# Patient Record
Sex: Female | Born: 1955
Health system: Southern US, Community
[De-identification: ages and names within clinical notes are randomized; demographics above are authoritative.]

## PROBLEM LIST (undated history)

## (undated) DIAGNOSIS — I10 Essential (primary) hypertension: Secondary | ICD-10-CM

## (undated) DIAGNOSIS — F32A Depression, unspecified: Secondary | ICD-10-CM

## (undated) DIAGNOSIS — G43009 Migraine without aura, not intractable, without status migrainosus: Secondary | ICD-10-CM

## (undated) DIAGNOSIS — T7840XA Allergy, unspecified, initial encounter: Secondary | ICD-10-CM

## (undated) DIAGNOSIS — F329 Major depressive disorder, single episode, unspecified: Secondary | ICD-10-CM

## (undated) DIAGNOSIS — M8580 Other specified disorders of bone density and structure, unspecified site: Secondary | ICD-10-CM

## (undated) DIAGNOSIS — E079 Disorder of thyroid, unspecified: Secondary | ICD-10-CM

## (undated) DIAGNOSIS — G4700 Insomnia, unspecified: Secondary | ICD-10-CM

## (undated) DIAGNOSIS — E785 Hyperlipidemia, unspecified: Secondary | ICD-10-CM

## (undated) DIAGNOSIS — K219 Gastro-esophageal reflux disease without esophagitis: Secondary | ICD-10-CM

## (undated) DIAGNOSIS — N951 Menopausal and female climacteric states: Secondary | ICD-10-CM

## (undated) HISTORY — DX: Allergy, unspecified, initial encounter: T78.40XA

## (undated) HISTORY — DX: Essential (primary) hypertension: I10

## (undated) HISTORY — DX: Major depressive disorder, single episode, unspecified: F32.9

## (undated) HISTORY — DX: Gastro-esophageal reflux disease without esophagitis: K21.9

## (undated) HISTORY — DX: Hyperlipidemia, unspecified: E78.5

## (undated) HISTORY — DX: Insomnia, unspecified: G47.00

## (undated) HISTORY — PX: TUBAL LIGATION: SHX77

## (undated) HISTORY — DX: Other specified disorders of bone density and structure, unspecified site: M85.80

## (undated) HISTORY — DX: Menopausal and female climacteric states: N95.1

## (undated) HISTORY — DX: Depression, unspecified: F32.A

## (undated) HISTORY — DX: Disorder of thyroid, unspecified: E07.9

## (undated) HISTORY — DX: Migraine without aura, not intractable, without status migrainosus: G43.009

---

## 2006-09-05 ENCOUNTER — Ambulatory Visit: Payer: Self-pay | Admitting: Gastroenterology

## 2010-05-15 ENCOUNTER — Ambulatory Visit: Payer: Self-pay | Admitting: Internal Medicine

## 2010-08-26 ENCOUNTER — Emergency Department: Payer: Self-pay | Admitting: Unknown Physician Specialty

## 2012-12-25 ENCOUNTER — Ambulatory Visit: Payer: Self-pay

## 2014-02-17 ENCOUNTER — Ambulatory Visit: Payer: Self-pay

## 2014-07-02 ENCOUNTER — Ambulatory Visit (INDEPENDENT_AMBULATORY_CARE_PROVIDER_SITE_OTHER): Payer: No Typology Code available for payment source | Admitting: Unknown Physician Specialty

## 2014-07-02 ENCOUNTER — Encounter: Payer: Self-pay | Admitting: Unknown Physician Specialty

## 2014-07-02 VITALS — BP 106/70 | HR 76 | Temp 98.3°F | Ht 65.0 in | Wt 188.4 lb

## 2014-07-02 DIAGNOSIS — R0602 Shortness of breath: Secondary | ICD-10-CM

## 2014-07-02 DIAGNOSIS — R079 Chest pain, unspecified: Secondary | ICD-10-CM

## 2014-07-02 DIAGNOSIS — G4730 Sleep apnea, unspecified: Secondary | ICD-10-CM | POA: Diagnosis not present

## 2014-07-02 NOTE — Patient Instructions (Signed)
Sleep Apnea  Sleep apnea is a sleep disorder characterized by abnormal pauses in breathing while you sleep. When your breathing pauses, the level of oxygen in your blood decreases. This causes you to move out of deep sleep and into light sleep. As a result, your quality of sleep is poor, and the system that carries your blood throughout your body (cardiovascular system) experiences stress. If sleep apnea remains untreated, the following conditions can develop:  High blood pressure (hypertension).  Coronary artery disease.  Inability to achieve or maintain an erection (impotence).  Impairment of your thought process (cognitive dysfunction). There are three types of sleep apnea: 1. Obstructive sleep apnea--Pauses in breathing during sleep because of a blocked airway. 2. Central sleep apnea--Pauses in breathing during sleep because the area of the brain that controls your breathing does not send the correct signals to the muscles that control breathing. 3. Mixed sleep apnea--A combination of both obstructive and central sleep apnea. RISK FACTORS The following risk factors can increase your risk of developing sleep apnea:  Being overweight.  Smoking.  Having narrow passages in your nose and throat.  Being of older age.  Being female.  Alcohol use.  Sedative and tranquilizer use.  Ethnicity. Among individuals younger than 35 years, African Americans are at increased risk of sleep apnea. SYMPTOMS   Difficulty staying asleep.  Daytime sleepiness and fatigue.  Loss of energy.  Irritability.  Loud, heavy snoring.  Morning headaches.  Trouble concentrating.  Forgetfulness.  Decreased interest in sex. DIAGNOSIS  In order to diagnose sleep apnea, your caregiver will perform a physical examination. Your caregiver may suggest that you take a home sleep test. Your caregiver may also recommend that you spend the night in a sleep lab. In the sleep lab, several monitors record  information about your heart, lungs, and brain while you sleep. Your leg and arm movements and blood oxygen level are also recorded. TREATMENT The following actions may help to resolve mild sleep apnea:  Sleeping on your side.   Using a decongestant if you have nasal congestion.   Avoiding the use of depressants, including alcohol, sedatives, and narcotics.   Losing weight and modifying your diet if you are overweight. There also are devices and treatments to help open your airway:  Oral appliances. These are custom-made mouthpieces that shift your lower jaw forward and slightly open your bite. This opens your airway.  Devices that create positive airway pressure. This positive pressure "splints" your airway open to help you breathe better during sleep. The following devices create positive airway pressure:  Continuous positive airway pressure (CPAP) device. The CPAP device creates a continuous level of air pressure with an air pump. The air is delivered to your airway through a mask while you sleep. This continuous pressure keeps your airway open.  Nasal expiratory positive airway pressure (EPAP) device. The EPAP device creates positive air pressure as you exhale. The device consists of single-use valves, which are inserted into each nostril and held in place by adhesive. The valves create very little resistance when you inhale but create much more resistance when you exhale. That increased resistance creates the positive airway pressure. This positive pressure while you exhale keeps your airway open, making it easier to breath when you inhale again.  Bilevel positive airway pressure (BPAP) device. The BPAP device is used mainly in patients with central sleep apnea. This device is similar to the CPAP device because it also uses an air pump to deliver continuous air pressure   through a mask. However, with the BPAP machine, the pressure is set at two different levels. The pressure when you  exhale is lower than the pressure when you inhale.  Surgery. Typically, surgery is only done if you cannot comply with less invasive treatments or if the less invasive treatments do not improve your condition. Surgery involves removing excess tissue in your airway to create a wider passage way. Document Released: 12/31/2001 Document Revised: 05/07/2012 Document Reviewed: 05/19/2011 ExitCare Patient Information 2015 [employer]. This information is not intended to replace advice given to you by your health care provider. Make sure you discuss any questions you have with your health care provider.  

## 2014-07-02 NOTE — Progress Notes (Signed)
BP 106/70 mmHg  Pulse 76  Temp(Src) 98.3 F (36.8 C) (Oral)  Ht  (1.651 m)  Wt 188 lb 6.4 oz (85.458 kg)  BMI 31.35 kg/m2  SpO2 95%   Subjective:    Patient ID: Kayla Wright, female    DOB: 17-Jun-1955, 59 y.o.   MRN: [id number]  HPI: SYLVANIA Wright is a 59 y.o. female presenting on 07/02/2014 for Shortness of Breath  Sleep apnea: Wore a monitor from Home health services and noted oxygen dropping to 75% at night.  She needs a sleep study due to the above, snoring, and sleepy all day.    Shortness of Breath This is a recurrent problem. The problem occurs intermittently. The problem has been unchanged. The average episode lasts 8 hours. Associated symptoms include chest pain and leg swelling. Pertinent negatives include no abdominal pain, claudication, coryza, ear pain, fever, headaches, hemoptysis, leg pain, neck pain or syncope. Nothing aggravates the symptoms. The patient has no known risk factors for DVT/PE. She has tried nothing for the symptoms.      Relevant past medical, surgical, family and social history reviewed and updated as indicated. Interim medical history since our last visit reviewed. Allergies and medications reviewed and updated.  Review of Systems  Constitutional: Negative for fever.  HENT: Negative for ear pain.   Respiratory: Positive for shortness of breath. Negative for hemoptysis.   Cardiovascular: Positive for chest pain and leg swelling. Negative for claudication and syncope.  Gastrointestinal: Negative for abdominal pain.  Musculoskeletal: Negative for neck pain.  Neurological: Negative for headaches.    Per HPI unless specifically indicated above     Objective:    BP 106/70 mmHg  Pulse 76  Temp(Src) 98.3 F (36.8 C) (Oral)  Ht  (1.651 m)  Wt 188 lb 6.4 oz (85.458 kg)  BMI 31.35 kg/m2  SpO2 95%  Wt Readings from Last 3 Encounters:  07/02/14 188 lb 6.4 oz (85.458 kg)  05/07/14 179 lb (81.194 kg)    Physical Exam  Constitutional:  She is oriented to person, place, and time. She appears well-developed and well-nourished. No distress.  HENT:  Head: Normocephalic and atraumatic.  Eyes: Conjunctivae and lids are normal. Right eye exhibits no discharge. Left eye exhibits no discharge. No scleral icterus.  Neck: Normal range of motion.  Cardiovascular: Normal rate and regular rhythm.   Pulmonary/Chest: Effort normal. No respiratory distress.  Abdominal: Soft. Normal appearance and bowel sounds are normal. She exhibits no distension. There is no splenomegaly or hepatomegaly. There is no tenderness.  Musculoskeletal: Normal range of motion.  Neurological: She is alert and oriented to person, place, and time.  Skin: Skin is intact. No rash noted. No pallor.  Psychiatric: She has a normal mood and affect. Her behavior is normal. Judgment and thought content normal.   EKG without acute changes   Assessment & Plan:     Problem List Items Addressed This Visit    None    Visit Diagnoses    Shortness of breath    -  Primary    Low oxygenation at night. Will order a sleep study    Relevant Orders    EKG 12-Lead (Completed)    Nocturnal polysomnography (NPSG)    Chest pain, unspecified chest pain type        Probably related to fatigue.  No acute changes on EKG    Relevant Orders    EKG 12-Lead (Completed)    Sleep apnea  Deoxygenation at night.  Ordered the sleep study    Relevant Orders    Nocturnal polysomnography (NPSG)        Follow up plan: Await results from sleep study

## 2014-07-29 ENCOUNTER — Other Ambulatory Visit: Payer: Self-pay | Admitting: Unknown Physician Specialty

## 2014-07-30 NOTE — Telephone Encounter (Signed)
Please call in.  Thanks.   

## 2014-07-30 NOTE — Telephone Encounter (Signed)
Called rx into pharmacy.

## 2014-08-26 ENCOUNTER — Other Ambulatory Visit: Payer: Self-pay | Admitting: Family Medicine

## 2014-09-17 ENCOUNTER — Other Ambulatory Visit: Payer: Self-pay | Admitting: Unknown Physician Specialty

## 2014-09-20 ENCOUNTER — Other Ambulatory Visit: Payer: Self-pay | Admitting: Unknown Physician Specialty

## 2014-10-20 ENCOUNTER — Other Ambulatory Visit: Payer: Self-pay | Admitting: Unknown Physician Specialty

## 2014-10-21 ENCOUNTER — Encounter: Payer: Self-pay | Admitting: Unknown Physician Specialty

## 2014-10-21 ENCOUNTER — Ambulatory Visit (INDEPENDENT_AMBULATORY_CARE_PROVIDER_SITE_OTHER): Payer: No Typology Code available for payment source | Admitting: Unknown Physician Specialty

## 2014-10-21 VITALS — BP 121/79 | HR 72 | Temp 98.3°F | Ht 65.5 in | Wt 195.8 lb

## 2014-10-21 DIAGNOSIS — E785 Hyperlipidemia, unspecified: Secondary | ICD-10-CM | POA: Diagnosis not present

## 2014-10-21 DIAGNOSIS — E039 Hypothyroidism, unspecified: Secondary | ICD-10-CM | POA: Insufficient documentation

## 2014-10-21 DIAGNOSIS — F419 Anxiety disorder, unspecified: Secondary | ICD-10-CM

## 2014-10-21 DIAGNOSIS — M722 Plantar fascial fibromatosis: Secondary | ICD-10-CM | POA: Diagnosis not present

## 2014-10-21 DIAGNOSIS — F329 Major depressive disorder, single episode, unspecified: Secondary | ICD-10-CM | POA: Insufficient documentation

## 2014-10-21 DIAGNOSIS — F322 Major depressive disorder, single episode, severe without psychotic features: Secondary | ICD-10-CM | POA: Insufficient documentation

## 2014-10-21 MED ORDER — MELOXICAM 15 MG PO TABS: 15.0000 mg | ORAL_TABLET | Freq: Every day | ORAL | Status: DC

## 2014-10-21 MED ORDER — CLONAZEPAM 0.5 MG PO TABS: 0.5000 mg | ORAL_TABLET | Freq: Two times a day (BID) | ORAL | Status: DC | PRN

## 2014-10-21 NOTE — Assessment & Plan Note (Signed)
Decreased Clonazepam to .5 mg

## 2014-10-21 NOTE — Progress Notes (Signed)
   BP 121/79 mmHg  Pulse 72  Temp(Src) 98.3 F (36.8 C)  Ht 5' 5.5" (1.664 m)  Wt 195 lb 12.8 oz (88.814 kg)  BMI 32.08 kg/m2  SpO2 97%   Subjective:    Patient ID: Kayla Wright, female    DOB: 03-Sep-1955, 59 y.o.   MRN: [id number]  HPI: Kayla Wright is a 59 y.o. female  Chief Complaint  Patient presents with  . Foot Pain    pt states she has heal and ankle pain in left foot, states walk-in clinic said she has bone spurs. States she has had pain for about 3 weeks.   Plantar Fascitis: Began about three weeks ago. Dorsiflexion and plantarflexion limited. Can not perform supination or pronation due pain. No injury to her knowledge. Swelling relieved when elevated. Aching pain with occasional sharp pain depending on movement.  Depression: She has been on duloxetine for about 4 months. Feels this is working well. Denies side effects. Would like a refill of Clonazepam.  She is taking 1 mg daily and would like to continue   Relevant past medical, surgical, family and social history reviewed and updated as indicated. Interim medical history since our last visit reviewed. Allergies and medications reviewed and updated.  Review of Systems  Constitutional: Negative.  Negative for activity change, appetite change and fatigue.  HENT: Negative.  Negative for congestion, rhinorrhea, sneezing and sore throat.   Eyes: Negative.  Negative for pain and discharge.  Respiratory: Negative.  Negative for cough, chest tightness, shortness of breath, wheezing and stridor.   Cardiovascular: Negative.  Negative for chest pain, palpitations and leg swelling.  Gastrointestinal: Negative.  Negative for abdominal pain, diarrhea and constipation.  Musculoskeletal: Positive for myalgias. Negative for back pain and gait problem.  Skin: Negative.  Negative for color change, pallor, rash and wound.  Neurological: Negative.  Negative for dizziness, syncope, light-headedness and headaches.  Psychiatric/Behavioral:  Negative.  Negative for suicidal ideas, confusion, sleep disturbance, self-injury and decreased concentration. The patient is not nervous/anxious.     Per HPI unless specifically indicated above     Objective:    BP 121/79 mmHg  Pulse 72  Temp(Src) 98.3 F (36.8 C)  Ht 5' 5.5" (1.664 m)  Wt 195 lb 12.8 oz (88.814 kg)  BMI 32.08 kg/m2  SpO2 97%  Wt Readings from Last 3 Encounters:  10/21/14 195 lb 12.8 oz (88.814 kg)  07/02/14 188 lb 6.4 oz (85.458 kg)  05/07/14 179 lb (81.194 kg)    Physical Exam  No results found for this or any previous visit.    Assessment & Plan:   Problem List Items Addressed This Visit      Unprioritized   Severe depression - Primary    Stable on duloxetine.      Chronic anxiety    Decreased Clonazepam to .5 mg      Hyperlipidemia   Hypothyroidism    Other Visit Diagnoses    Plantar fasciitis        Left foot. Strectching exercises provided with meloxicam prescription. Education handout provided.        Follow up plan: Return in about 3 months (around 01/20/2015) for physical.

## 2014-10-21 NOTE — Patient Instructions (Signed)
Plantar Fasciitis  Plantar fasciitis is a common condition that causes foot pain. It is soreness (inflammation) of the band of tough fibrous tissue on the bottom of the foot that runs from the heel bone (calcaneus) to the ball of the foot. The cause of this soreness may be from excessive standing, poor fitting shoes, running on hard surfaces, being overweight, having an abnormal walk, or overuse (this is common in runners) of the painful foot or feet. It is also common in aerobic exercise dancers and ballet dancers.  SYMPTOMS   Most people with plantar fasciitis complain of:   Severe pain in the morning on the bottom of their foot especially when taking the first steps out of bed. This pain recedes after a few minutes of walking.   Severe pain is experienced also during walking following a long period of inactivity.   Pain is worse when walking barefoot or up stairs  DIAGNOSIS    Your caregiver will diagnose this condition by examining and feeling your foot.   Special tests such as X-rays of your foot, are usually not needed.  PREVENTION    Consult a sports medicine professional before beginning a new exercise program.   Walking programs offer a good workout. With walking there is a lower chance of overuse injuries common to runners. There is less impact and less jarring of the joints.   Begin all new exercise programs slowly. If problems or pain develop, decrease the amount of time or distance until you are at a comfortable level.   Wear good shoes and replace them regularly.   Stretch your foot and the heel cords at the back of the ankle (Achilles tendon) both before and after exercise.   Run or exercise on even surfaces that are not hard. For example, asphalt is better than pavement.   Do not run barefoot on hard surfaces.   If using a treadmill, vary the incline.   Do not continue to workout if you have foot or joint problems. Seek professional help if they do not improve.  HOME CARE INSTRUCTIONS     Avoid activities that cause you pain until you recover.   Use ice or cold packs on the problem or painful areas after working out.   Only take over-the-counter or prescription medicines for pain, discomfort, or fever as directed by your caregiver.   Soft shoe inserts or athletic shoes with air or gel sole cushions may be helpful.   If problems continue or become more severe, consult a sports medicine caregiver or your own health care provider. Cortisone is a potent anti-inflammatory medication that may be injected into the painful area. You can discuss this treatment with your caregiver.  MAKE SURE YOU:    Understand these instructions.   Will watch your condition.   Will get help right away if you are not doing well or get worse.  Document Released: 10/05/2000 Document Revised: 04/04/2011 Document Reviewed: 12/05/2007  ExitCare Patient Information 2015 [employer]. This information is not intended to replace advice given to you by your health care provider. Make sure you discuss any questions you have with your health care provider.

## 2014-10-21 NOTE — Assessment & Plan Note (Signed)
Stable on duloxetine

## 2014-10-27 ENCOUNTER — Other Ambulatory Visit: Payer: Self-pay | Admitting: Unknown Physician Specialty

## 2014-10-27 MED ORDER — DULOXETINE HCL 60 MG PO CPEP: 60.0000 mg | ORAL_CAPSULE | Freq: Every day | ORAL | Status: DC

## 2014-10-27 NOTE — Telephone Encounter (Signed)
Routing to provider. Patient was last seen 10/21/14 and pharmacy is [facility] on [address].

## 2014-10-27 NOTE — Telephone Encounter (Signed)
Cymbalta to [facility] [address]  She is out of refills and switching from [facility] to [place].

## 2014-11-03 ENCOUNTER — Telehealth: Payer: Self-pay | Admitting: Unknown Physician Specialty

## 2014-11-03 NOTE — Telephone Encounter (Signed)
Pt called in and explained her symptoms of numbness in her right arm and hand(she said it just went dead). i advised her to go to the emergency room and she didn't want to do that and wanted to make him an appt

## 2014-11-04 ENCOUNTER — Encounter: Payer: Self-pay | Admitting: Unknown Physician Specialty

## 2014-11-04 ENCOUNTER — Ambulatory Visit (INDEPENDENT_AMBULATORY_CARE_PROVIDER_SITE_OTHER): Payer: No Typology Code available for payment source | Admitting: Unknown Physician Specialty

## 2014-11-04 VITALS — BP 125/69 | HR 66 | Temp 98.3°F | Ht 65.7 in | Wt 201.2 lb

## 2014-11-04 DIAGNOSIS — Z23 Encounter for immunization: Secondary | ICD-10-CM | POA: Diagnosis not present

## 2014-11-04 DIAGNOSIS — M62838 Other muscle spasm: Secondary | ICD-10-CM

## 2014-11-04 DIAGNOSIS — M5410 Radiculopathy, site unspecified: Secondary | ICD-10-CM | POA: Diagnosis not present

## 2014-11-04 MED ORDER — MELOXICAM 15 MG PO TABS: 15.0000 mg | ORAL_TABLET | Freq: Every day | ORAL | Status: DC

## 2014-11-04 MED ORDER — CYCLOBENZAPRINE HCL 10 MG PO TABS: 10.0000 mg | ORAL_TABLET | Freq: Three times a day (TID) | ORAL | Status: DC | PRN

## 2014-11-04 NOTE — Progress Notes (Signed)
-------------------------------------------------------  BP 125/69 mmHg  Pulse 66  Temp(Src) 98.3 F (36.8 C)  Ht 5' 5.7" (1.669 m)  Wt 201 lb 3.2 oz (91.264 kg)  BMI 32.76 kg/m2  SpO2 97%   Subjective:    Patient ID: Kayla Wright, female    DOB: 05-16-1955, 59 y.o.   MRN: [id number]  HPI: Kayla Wright is a 59 y.o. female  Chief Complaint  Patient presents with  . Arm Pain    pt states she has pain and numbness in right arm and hand. States symptoms started about a week ago.   Arm pain "it went dead on me" about 1 week ago while lying on the couch.  States she went to work and experienced pain and numbness.  Went to Urgent Care and was given Tramadol.  She didn't like how it made her feel and only took it for 2 days. Nothing makes it worse or better.   Relevant past medical, surgical, family and social history reviewed and updated as indicated. Interim medical history since our last visit reviewed. Allergies and medications reviewed and updated.  Review of Systems  Per HPI unless specifically indicated above     Objective:    BP 125/69 mmHg  Pulse 66  Temp(Src) 98.3 F (36.8 C)  Ht 5' 5.7" (1.669 m)  Wt 201 lb 3.2 oz (91.264 kg)  BMI 32.76 kg/m2  SpO2 97%  Wt Readings from Last 3 Encounters:  11/04/14 201 lb 3.2 oz (91.264 kg)  10/21/14 195 lb 12.8 oz (88.814 kg)  07/02/14 188 lb 6.4 oz (85.458 kg)    Physical Exam  Constitutional: She is oriented to person, place, and time. She appears well-developed and well-nourished. No distress.  HENT:  Head: Normocephalic and atraumatic.  Eyes: Conjunctivae and lids are normal. Right eye exhibits no discharge. Left eye exhibits no discharge. No scleral icterus.  Cardiovascular: Normal rate, regular rhythm and normal heart sounds.   Pulmonary/Chest: Effort normal and breath sounds normal. No respiratory distress.  Abdominal: Normal appearance and bowel sounds are normal. There is no splenomegaly or hepatomegaly.   Musculoskeletal:       Right shoulder: She exhibits spasm. She exhibits normal strength.       Cervical back: She exhibits decreased range of motion and tenderness. She exhibits no bony tenderness, no edema and no pain.  Strength and DTRs 4 plus and equal bilateral  Neurological: She is alert and oriented to person, place, and time.  Skin: Skin is intact. No rash noted. No pallor.  Psychiatric: She has a normal mood and affect. Her behavior is normal. Judgment and thought content normal.    No results found for this or any previous visit.    Assessment & Plan:   Problem List Items Addressed This Visit    None    Visit Diagnoses    Immunization due    -  Primary    Relevant Orders    Flu Vaccine QUAD 36+ mos PF IM (Fluarix & Fluzone Quad PF) (Completed)    Muscle spasm of right shoulder        Radiculopathy of arm        Relevant Medications    cyclobenzaprine (FLEXERIL) 10 MG tablet       Radiculopathy is related to spasm.  Recommended and numbers given for chiropractic care.  Rx for NSAID and muscle relaxant.    Follow up plan: Return if symptoms worsen or fail to improve.

## 2014-11-04 NOTE — Patient Instructions (Signed)
[facility] [phone] Dr. Patrici Ranks [phone]

## 2014-12-12 ENCOUNTER — Ambulatory Visit (INDEPENDENT_AMBULATORY_CARE_PROVIDER_SITE_OTHER): Payer: No Typology Code available for payment source | Admitting: Unknown Physician Specialty

## 2014-12-12 ENCOUNTER — Encounter: Payer: Self-pay | Admitting: Unknown Physician Specialty

## 2014-12-12 VITALS — BP 124/76 | HR 83 | Temp 98.6°F | Ht 65.4 in | Wt 195.0 lb

## 2014-12-12 DIAGNOSIS — J069 Acute upper respiratory infection, unspecified: Secondary | ICD-10-CM

## 2014-12-12 MED ORDER — HYDROCOD POLST-CPM POLST ER 10-8 MG/5ML PO SUER: 5.0000 mL | Freq: Two times a day (BID) | ORAL | Status: DC | PRN

## 2014-12-12 NOTE — Progress Notes (Signed)
   BP 124/76 mmHg  Pulse 83  Temp(Src) 98.6 F (37 C)  Ht 5' 5.4" (1.661 m)  Wt 195 lb (88.451 kg)  BMI 32.06 kg/m2  SpO2 97%   Subjective:    Patient ID: Kayla Wright, female    DOB: 02/11/1955, 59 y.o.   MRN: [id number]  HPI: Kayla Wright is a 59 y.o. female  Chief Complaint  Patient presents with  . Cough    pt states she was seen at walk in yesterday and was given Augmentin, but nothing for a cough. States she coughed all night and it has caused a headache. States it started Tuesday morning.   Cough This is a new (as above.  Wants something for cough) problem. Episode onset: 3 days. The problem occurs constantly. The cough is non-productive. Associated symptoms include headaches, nasal congestion, rhinorrhea, a sore throat and shortness of breath. Pertinent negatives include no chills, ear congestion, fever or myalgias. Nothing aggravates the symptoms. She has tried nothing for the symptoms.    Relevant past medical, surgical, family and social history reviewed and updated as indicated. Interim medical history since our last visit reviewed. Allergies and medications reviewed and updated.  Review of Systems  Constitutional: Negative for fever and chills.  HENT: Positive for rhinorrhea and sore throat.   Respiratory: Positive for cough and shortness of breath.   Musculoskeletal: Negative for myalgias.  Neurological: Positive for headaches.    Per HPI unless specifically indicated above     Objective:    BP 124/76 mmHg  Pulse 83  Temp(Src) 98.6 F (37 C)  Ht 5' 5.4" (1.661 m)  Wt 195 lb (88.451 kg)  BMI 32.06 kg/m2  SpO2 97%  Wt Readings from Last 3 Encounters:  12/12/14 195 lb (88.451 kg)  11/04/14 201 lb 3.2 oz (91.264 kg)  10/21/14 195 lb 12.8 oz (88.814 kg)    Physical Exam  Constitutional: She is oriented to person, place, and time. She appears well-developed and well-nourished. No distress.  HENT:  Head: Normocephalic and atraumatic.  Right Ear: Tympanic  membrane and ear canal normal.  Left Ear: Tympanic membrane and ear canal normal.  Nose: Rhinorrhea present. Right sinus exhibits no maxillary sinus tenderness and no frontal sinus tenderness. Left sinus exhibits no maxillary sinus tenderness and no frontal sinus tenderness.  Mouth/Throat: Mucous membranes are normal. Posterior oropharyngeal erythema present.  Eyes: Conjunctivae and lids are normal. Right eye exhibits no discharge. Left eye exhibits no discharge. No scleral icterus.  Cardiovascular: Normal rate and regular rhythm.   Pulmonary/Chest: Effort normal and breath sounds normal. No respiratory distress.  Abdominal: Normal appearance. There is no splenomegaly or hepatomegaly.  Musculoskeletal: Normal range of motion.  Neurological: She is alert and oriented to person, place, and time.  Skin: Skin is intact. No rash noted. No pallor.  Psychiatric: She has a normal mood and affect. Her behavior is normal. Judgment and thought content normal.    No results found for this or any previous visit.    Assessment & Plan:   Problem List Items Addressed This Visit    None    Visit Diagnoses    URI (upper respiratory infection)    -  Primary       Tussionex for cough Follow up plan: Return if symptoms worsen or fail to improve.

## 2015-01-07 ENCOUNTER — Other Ambulatory Visit: Payer: Self-pay | Admitting: Unknown Physician Specialty

## 2015-01-12 ENCOUNTER — Other Ambulatory Visit: Payer: Self-pay | Admitting: Unknown Physician Specialty

## 2015-01-27 ENCOUNTER — Encounter: Payer: Self-pay | Admitting: Emergency Medicine

## 2015-01-27 ENCOUNTER — Emergency Department
Admission: EM | Admit: 2015-01-27 | Discharge: 2015-01-27 | Disposition: A | Discharge status: Home / Self Care | Payer: BLUE CROSS/BLUE SHIELD | Attending: Emergency Medicine | Admitting: Emergency Medicine

## 2015-01-27 DIAGNOSIS — F329 Major depressive disorder, single episode, unspecified: Secondary | ICD-10-CM | POA: Diagnosis not present

## 2015-01-27 DIAGNOSIS — Z79899 Other long term (current) drug therapy: Secondary | ICD-10-CM | POA: Diagnosis not present

## 2015-01-27 DIAGNOSIS — I10 Essential (primary) hypertension: Secondary | ICD-10-CM | POA: Diagnosis not present

## 2015-01-27 DIAGNOSIS — Y9389 Activity, other specified: Secondary | ICD-10-CM | POA: Insufficient documentation

## 2015-01-27 DIAGNOSIS — S3992XA Unspecified injury of lower back, initial encounter: Secondary | ICD-10-CM | POA: Insufficient documentation

## 2015-01-27 DIAGNOSIS — S4991XA Unspecified injury of right shoulder and upper arm, initial encounter: Secondary | ICD-10-CM | POA: Insufficient documentation

## 2015-01-27 DIAGNOSIS — Z792 Long term (current) use of antibiotics: Secondary | ICD-10-CM | POA: Diagnosis not present

## 2015-01-27 DIAGNOSIS — E785 Hyperlipidemia, unspecified: Secondary | ICD-10-CM | POA: Insufficient documentation

## 2015-01-27 DIAGNOSIS — S29002A Unspecified injury of muscle and tendon of back wall of thorax, initial encounter: Secondary | ICD-10-CM | POA: Diagnosis not present

## 2015-01-27 DIAGNOSIS — Y92410 Unspecified street and highway as the place of occurrence of the external cause: Secondary | ICD-10-CM | POA: Diagnosis not present

## 2015-01-27 DIAGNOSIS — S161XXA Strain of muscle, fascia and tendon at neck level, initial encounter: Secondary | ICD-10-CM | POA: Insufficient documentation

## 2015-01-27 DIAGNOSIS — S199XXA Unspecified injury of neck, initial encounter: Secondary | ICD-10-CM | POA: Diagnosis present

## 2015-01-27 DIAGNOSIS — E039 Hypothyroidism, unspecified: Secondary | ICD-10-CM | POA: Insufficient documentation

## 2015-01-27 DIAGNOSIS — Y998 Other external cause status: Secondary | ICD-10-CM | POA: Diagnosis not present

## 2015-01-27 DIAGNOSIS — S4992XA Unspecified injury of left shoulder and upper arm, initial encounter: Secondary | ICD-10-CM | POA: Diagnosis not present

## 2015-01-27 MED ORDER — TRAMADOL HCL 50 MG PO TABS: 50.0000 mg | ORAL_TABLET | Freq: Four times a day (QID) | ORAL | Status: DC | PRN

## 2015-01-27 MED ORDER — OXYCODONE-ACETAMINOPHEN 5-325 MG PO TABS
1.0000 | ORAL_TABLET | Freq: Once | ORAL | Status: AC
  Administered 2015-01-27: 1 via ORAL
  Filled 2015-01-27: qty 1

## 2015-01-27 MED ORDER — METHOCARBAMOL 500 MG PO TABS
1000.0000 mg | ORAL_TABLET | Freq: Once | ORAL | Status: AC
  Administered 2015-01-27: 1000 mg via ORAL
  Filled 2015-01-27: qty 2

## 2015-01-27 MED ORDER — IBUPROFEN 800 MG PO TABS
800.0000 mg | ORAL_TABLET | Freq: Once | ORAL | Status: AC
  Administered 2015-01-27: 800 mg via ORAL
  Filled 2015-01-27: qty 1

## 2015-01-27 MED ORDER — IBUPROFEN 800 MG PO TABS: 800.0000 mg | ORAL_TABLET | Freq: Three times a day (TID) | ORAL | Status: DC | PRN

## 2015-01-27 MED ORDER — METHOCARBAMOL 750 MG PO TABS: 1500.0000 mg | ORAL_TABLET | Freq: Four times a day (QID) | ORAL | Status: DC
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
## 2015-01-27 NOTE — Discharge Instructions (Signed)
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
## 2015-02-11 ENCOUNTER — Ambulatory Visit (INDEPENDENT_AMBULATORY_CARE_PROVIDER_SITE_OTHER): Payer: BLUE CROSS/BLUE SHIELD | Admitting: Unknown Physician Specialty

## 2015-02-11 ENCOUNTER — Encounter: Payer: Self-pay | Admitting: Unknown Physician Specialty

## 2015-02-11 ENCOUNTER — Ambulatory Visit
Admission: RE | Admit: 2015-02-11 | Discharge: 2015-02-11 | Disposition: A | Discharge status: Home / Self Care | Payer: BLUE CROSS/BLUE SHIELD | Source: Ambulatory Visit | Attending: Unknown Physician Specialty | Admitting: Unknown Physician Specialty

## 2015-02-11 VITALS — BP 130/81 | HR 89 | Temp 99.1°F | Ht 64.2 in | Wt 199.0 lb

## 2015-02-11 DIAGNOSIS — M4802 Spinal stenosis, cervical region: Secondary | ICD-10-CM | POA: Diagnosis not present

## 2015-02-11 DIAGNOSIS — M542 Cervicalgia: Secondary | ICD-10-CM

## 2015-02-11 DIAGNOSIS — M50323 Other cervical disc degeneration at C6-C7 level: Secondary | ICD-10-CM | POA: Diagnosis not present

## 2015-02-11 DIAGNOSIS — M546 Pain in thoracic spine: Secondary | ICD-10-CM

## 2015-02-11 DIAGNOSIS — M50322 Other cervical disc degeneration at C5-C6 level: Secondary | ICD-10-CM | POA: Diagnosis not present

## 2015-02-11 MED ORDER — TRAMADOL HCL 50 MG PO TABS: 50.0000 mg | ORAL_TABLET | Freq: Three times a day (TID) | ORAL | Status: DC | PRN

## 2015-02-11 MED ORDER — METHOCARBAMOL 750 MG PO TABS: 750.0000 mg | ORAL_TABLET | Freq: Four times a day (QID) | ORAL | Status: DC

## 2015-02-11 MED ORDER — IBUPROFEN 800 MG PO TABS
800.0000 mg | ORAL_TABLET | Freq: Three times a day (TID) | ORAL | Status: DC | PRN
Start: 2015-02-11 — End: 2015-04-13
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
## 2015-02-18 ENCOUNTER — Telehealth: Payer: Self-pay | Admitting: Unknown Physician Specialty

## 2015-02-18 NOTE — Telephone Encounter (Signed)
Routing to provider  

## 2015-02-18 NOTE — Telephone Encounter (Signed)
Called and left message about neck x-ray

## 2015-02-18 NOTE — Telephone Encounter (Signed)
Pt would like a call back about xray results  °

## 2015-02-23 ENCOUNTER — Other Ambulatory Visit: Payer: Self-pay | Admitting: Unknown Physician Specialty

## 2015-03-26 ENCOUNTER — Other Ambulatory Visit: Payer: Self-pay | Admitting: Unknown Physician Specialty

## 2015-04-06 ENCOUNTER — Other Ambulatory Visit: Payer: Self-pay | Admitting: Unknown Physician Specialty

## 2015-04-07 ENCOUNTER — Ambulatory Visit: Payer: BLUE CROSS/BLUE SHIELD | Admitting: Family Medicine

## 2015-04-13 ENCOUNTER — Encounter: Payer: Self-pay | Admitting: Unknown Physician Specialty

## 2015-04-13 ENCOUNTER — Encounter: Payer: Self-pay | Admitting: Family Medicine

## 2015-04-13 ENCOUNTER — Ambulatory Visit (INDEPENDENT_AMBULATORY_CARE_PROVIDER_SITE_OTHER): Payer: BLUE CROSS/BLUE SHIELD | Admitting: Unknown Physician Specialty

## 2015-04-13 ENCOUNTER — Encounter: Payer: BLUE CROSS/BLUE SHIELD | Admitting: Family Medicine

## 2015-04-13 VITALS — BP 118/71 | HR 92 | Temp 98.1°F | Ht 64.25 in | Wt 195.0 lb

## 2015-04-13 DIAGNOSIS — E785 Hyperlipidemia, unspecified: Secondary | ICD-10-CM

## 2015-04-13 DIAGNOSIS — E038 Other specified hypothyroidism: Secondary | ICD-10-CM

## 2015-04-13 DIAGNOSIS — F419 Anxiety disorder, unspecified: Secondary | ICD-10-CM | POA: Diagnosis not present

## 2015-04-13 DIAGNOSIS — F322 Major depressive disorder, single episode, severe without psychotic features: Secondary | ICD-10-CM

## 2015-04-13 DIAGNOSIS — F329 Major depressive disorder, single episode, unspecified: Secondary | ICD-10-CM | POA: Diagnosis not present

## 2015-04-13 MED ORDER — DULOXETINE HCL 60 MG PO CPEP: 60.0000 mg | ORAL_CAPSULE | Freq: Every day | ORAL | Status: DC

## 2015-04-13 MED ORDER — SIMVASTATIN 40 MG PO TABS: 40.0000 mg | ORAL_TABLET | Freq: Every day | ORAL | Status: DC

## 2015-04-13 MED ORDER — LEVOTHYROXINE SODIUM 50 MCG PO TABS: 50.0000 ug | ORAL_TABLET | Freq: Every evening | ORAL | Status: DC | PRN

## 2015-04-13 NOTE — Assessment & Plan Note (Signed)
Mixed with severe depression

## 2015-04-13 NOTE — Assessment & Plan Note (Signed)
Seeing pastor 3 times/week.  Sister has moved out which is helpful and son is spending more time with her.  She doesn't feel like she needs time off of work.  She is taking her Cymbalta.  But, she is sleeping only 2-3 hours at night.  I will add Ativan .5 mg QHS for sleep

## 2015-04-13 NOTE — Assessment & Plan Note (Signed)
Check cholesterol.

## 2015-04-13 NOTE — Assessment & Plan Note (Signed)
Check TSH 

## 2015-04-13 NOTE — Progress Notes (Signed)
BP 118/71 mmHg  Pulse 92  Temp(Src) 98.1 F (36.7 C)  Ht 5' 4.25" (1.632 m)  Wt 195 lb (88.451 kg)  BMI 33.21 kg/m2   Subjective:    Patient ID: Kayla Wright, female    DOB: 01/06/1956, 60 y.o.   MRN: [id number]  HPI: Kayla Wright is a 60 y.o. female  Chief Complaint  Patient presents with  . Depression  . Hyperlipidemia  . Hypothyroidism   Pt states she is tired, not sleeping well and "wants to strangle people"    GAD 7 : Generalized Anxiety Score 04/13/2015  Nervous, Anxious, on Edge 3  Control/stop worrying 2  Worry too much - different things 3  Trouble relaxing 3  Restless 3  Easily annoyed or irritable 2  Afraid - awful might happen 2  Total GAD 7 Score 18  Anxiety Difficulty Very difficult   Depression screen PHQ 2/9 04/13/2015  Decreased Interest 3  Down, Depressed, Hopeless 3  PHQ - 2 Score 6  Altered sleeping 3  Tired, decreased energy 3  Change in appetite 2  Feeling bad or failure about yourself  3  Trouble concentrating 2  Moving slowly or fidgety/restless 3  Suicidal thoughts 3  PHQ-9 Score 25   Pt states she is not at risk of hurting herself as she has her son to think about.  No guns in the house.   Hyperlipidemia Using medications without problems No Muscle aches  Diet compliance: not good Exercise: not exercising  Hypothyroid Needs TSH checked    Relevant past medical, surgical, family and social history reviewed and updated as indicated. Interim medical history since our last visit reviewed. Allergies and medications reviewed and updated.  Review of Systems  Per HPI unless specifically indicated above     Objective:    BP 118/71 mmHg  Pulse 92  Temp(Src) 98.1 F (36.7 C)  Ht 5' 4.25" (1.632 m)  Wt 195 lb (88.451 kg)  BMI 33.21 kg/m2  Wt Readings from Last 3 Encounters:  04/13/15 195 lb (88.451 kg)  04/13/15 195 lb (88.451 kg)  02/11/15 199 lb (90.266 kg)    Physical Exam  Constitutional: She is oriented to person,  place, and time. She appears well-developed and well-nourished. No distress.  HENT:  Head: Normocephalic and atraumatic.  Eyes: Conjunctivae and lids are normal. Right eye exhibits no discharge. Left eye exhibits no discharge. No scleral icterus.  Cardiovascular: Normal rate.   Pulmonary/Chest: Effort normal.  Abdominal: Normal appearance. There is no splenomegaly or hepatomegaly.  Musculoskeletal: Normal range of motion.  Neurological: She is alert and oriented to person, place, and time.  Skin: Skin is intact. No rash noted. No pallor.  Psychiatric: Judgment normal. Thought content is not delusional. She expresses no suicidal plans and no homicidal plans.  Tearful.    No results found for this or any previous visit.    Assessment & Plan:   Problem List Items Addressed This Visit      Unprioritized   Severe depression - Primary    Seeing pastor 3 times/week.  Sister has moved out which is helpful and son is spending more time with her.  She doesn't feel like she needs time off of work.  She is taking her Cymbalta.  But, she is sleeping only 2-3 hours at night.  I will add Ativan .5 mg QHS for sleep      Relevant Medications   DULoxetine (CYMBALTA) 60 MG capsule   Other Relevant Orders  Ambulatory referral to Psychiatry   Comprehensive metabolic panel   VITAMIN D 25 Hydroxy (Vit-D Deficiency, Fractures)   Chronic anxiety    Mixed with severe depression      Relevant Medications   DULoxetine (CYMBALTA) 60 MG capsule   Other Relevant Orders   Comprehensive metabolic panel   Hyperlipidemia    Check cholesterol      Relevant Medications   simvastatin (ZOCOR) 40 MG tablet   Other Relevant Orders   Lipid Panel Piccolo, Waived   Hypothyroidism    Check TSH      Relevant Medications   levothyroxine (SYNTHROID, LEVOTHROID) 50 MCG tablet   Other Relevant Orders   TSH       Follow up plan: Return for for physical.

## 2015-04-14 LAB — COMPREHENSIVE METABOLIC PANEL
ALT: 24 IU/L (ref 0–32)
AST: 28 IU/L (ref 0–40)
Albumin/Globulin Ratio: 1.6 (ref 1.2–2.2)
Albumin: 4.4 g/dL (ref 3.5–5.5)
Alkaline Phosphatase: 101 IU/L (ref 39–117)
BUN/Creatinine Ratio: 10 (ref 9–23)
BUN: 10 mg/dL (ref 6–24)
Bilirubin Total: 0.5 mg/dL (ref 0.0–1.2)
CO2: 22 mmol/L (ref 18–29)
Calcium: 9.2 mg/dL (ref 8.7–10.2)
Chloride: 99 mmol/L (ref 96–106)
Creatinine, Ser: 1 mg/dL (ref 0.57–1.00)
GFR calc Af Amer: 71 mL/min/{1.73_m2} (ref >59)
GFR calc non Af Amer: 62 mL/min/{1.73_m2} (ref >59)
Globulin, Total: 2.7 g/dL (ref 1.5–4.5)
Glucose: 101 mg/dL — ABNORMAL HIGH (ref 65–99)
Potassium: 4.2 mmol/L (ref 3.5–5.2)
Sodium: 140 mmol/L (ref 134–144)
Total Protein: 7.1 g/dL (ref 6.0–8.5)

## 2015-04-14 LAB — LIPID PANEL W/O CHOL/HDL RATIO
Cholesterol, Total: 246 mg/dL — ABNORMAL HIGH (ref 100–199)
HDL: 39 mg/dL — ABNORMAL LOW (ref >39)
LDL Calculated: 163 mg/dL — ABNORMAL HIGH (ref 0–99)
Triglycerides: 219 mg/dL — ABNORMAL HIGH (ref 0–149)
VLDL Cholesterol Cal: 44 mg/dL — ABNORMAL HIGH (ref 5–40)

## 2015-04-14 LAB — TSH: TSH: 2.56 u[IU]/mL (ref 0.450–4.500)

## 2015-04-14 LAB — VITAMIN D 25 HYDROXY (VIT D DEFICIENCY, FRACTURES): Vit D, 25-Hydroxy: 16.8 ng/mL — ABNORMAL LOW (ref 30.0–100.0)

## 2015-04-14 NOTE — Progress Notes (Signed)
  This encounter was created in error - please disregard. Please see other note from today

## 2015-04-15 ENCOUNTER — Telehealth: Payer: Self-pay | Admitting: Unknown Physician Specialty

## 2015-04-15 MED ORDER — LORAZEPAM 0.5 MG PO TABS: 0.5000 mg | ORAL_TABLET | Freq: Every day | ORAL | Status: DC

## 2015-04-15 MED ORDER — LEVOTHYROXINE SODIUM 50 MCG PO TABS: 50.0000 ug | ORAL_TABLET | Freq: Every evening | ORAL | Status: DC | PRN

## 2015-04-15 NOTE — Telephone Encounter (Signed)
I called and spoke to patient. She states that she has been taking levothyroxine.

## 2015-04-15 NOTE — Telephone Encounter (Signed)
levothyroxine (SYNTHROID, LEVOTHROID) 50 MCG tablet Pharmacy: [id number] - [place] [address]), [place] - [address]  Patient called stating that the medication is not working and needs something else called in. If you need to talk to her she can be reach before 10:30am or after 3pm, thanks.

## 2015-04-15 NOTE — Telephone Encounter (Signed)
Routing to provider  

## 2015-04-15 NOTE — Telephone Encounter (Signed)
Is the patient supposed to be on levothyroxine or not?

## 2015-04-15 NOTE — Telephone Encounter (Signed)
OK.  I am putting in a new order.  Please fax to pharmacy

## 2015-05-13 ENCOUNTER — Encounter: Payer: Self-pay | Admitting: Unknown Physician Specialty

## 2015-05-13 NOTE — Telephone Encounter (Signed)
error:[id number] ° °

## 2015-05-14 ENCOUNTER — Telehealth: Payer: Self-pay

## 2015-05-14 NOTE — Telephone Encounter (Signed)
Patient called stating her medication Cymbalta is $200 with insurance and she cannot afford that. Patient was wondering if the medication could be changed to something else for her anxiety and depression.

## 2015-05-15 NOTE — Telephone Encounter (Signed)
Please call pharmacy and ask about the price.  It is generic and should be covered.

## 2015-05-15 NOTE — Telephone Encounter (Signed)
Called and let patient know that with her [employer], the cymbalta should not cost her anything. She asked about getting Lorazepam refilled and I told her that she still should have 1 refill on it according to her chart. I asked for her to give us a call if she has any questions or concerns.

## 2015-05-15 NOTE — Telephone Encounter (Signed)
Called and spoke to [place] at the pharmacy. She stated that with a med management card that the patient has been using the price of the medication was $126 for 90 days. Herbert SetaHeather stated that the patient has always used this card in the past. But Herbert SetaHeather stated she ran it with the [employer] patient has and there is no charge with that. So I will call the patient and let her know that there should be no charge now since they ran it with her [employer].

## 2015-05-20 ENCOUNTER — Ambulatory Visit (INDEPENDENT_AMBULATORY_CARE_PROVIDER_SITE_OTHER): Payer: BLUE CROSS/BLUE SHIELD | Admitting: Psychiatry

## 2015-05-20 ENCOUNTER — Encounter: Payer: Self-pay | Admitting: Psychiatry

## 2015-05-20 VITALS — BP 124/78 | HR 90 | Temp 97.5°F | Ht 64.0 in | Wt 196.2 lb

## 2015-05-20 DIAGNOSIS — F331 Major depressive disorder, recurrent, moderate: Secondary | ICD-10-CM

## 2015-05-20 DIAGNOSIS — F410 Panic disorder [episodic paroxysmal anxiety] without agoraphobia: Secondary | ICD-10-CM | POA: Diagnosis not present

## 2015-05-20 MED ORDER — LORAZEPAM 0.5 MG PO TABS: 0.2500 mg | ORAL_TABLET | Freq: Every day | ORAL | Status: DC

## 2015-05-20 NOTE — Progress Notes (Signed)
Psychiatric Initial Adult Assessment   Patient Identification: Kayla Wright MRN:  [id number] Date of Evaluation:  05/20/2015 Referral Source:  Gabriel Cirri, NP @ [facility] Chief Complaint:   Chief Complaint    Establish Care; Depression; Panic Attack; Fatigue; Stress; Hallucinations; Headache     Visit Diagnosis:    ICD-9-CM ICD-10-CM   1. MDD (major depressive disorder), recurrent episode, moderate (HCC) 296.32 F33.1   2. Panic attack 300.01 F41.0     History of Present Illness:    Patient is a 60 year old female presented for initial assessment. She was following Gabriel Cirri at [facility].  Patient appeared very tired and sleepy during the interview. She reported that she has been stressed out due to work as she works in [employer] and there is lot of pressure. Patient reported that she has been trying to work as much as she can. She was having a difficult time answering questions. She reported that she is not having any suicidal ideations or plans. She reported that she was given lorazepam to help her sleep and now she is sleeping too much. She has previously tried clonazepam as prescribed by her primary care physician.   She reported that she has a son who lives close by. Her other family members also lives in the area. She does not have much contact with them. She reported that she is currently widowed as her husband passed away 4 years ago.  Patient currently denied having any suicidal homicidal ideations or plans. She denied having any perceptual disturbances.  Associated Signs/Symptoms: Depression Symptoms:  depressed mood, psychomotor retardation, fatigue, feelings of worthlessness/guilt, hopelessness, anxiety, panic attacks, loss of energy/fatigue, disturbed sleep, (Hypo) Manic Symptoms:  Irritable Mood, Labiality of Mood, Anxiety Symptoms:  Excessive Worry, Psychotic Symptoms:  Hallucinations: Visual PTSD Symptoms: Negative NA  Past Psychiatric History:    Patient has been diagnosed with anxiety and panic and was getting lorazepam from her primary care physician. She was really having a difficult time answering questions as she was very sleepy during the interview. She reported that she takes Cymbalta and lorazepam at bedtime.  Previous Psychotropic Medications:  Cymbalta  Lorazepam  Klonopin.  Substance Abuse History in the last 12 months:  No.  Consequences of Substance Abuse: Negative NA  Past Medical History:  Past Medical History  Diagnosis Date  . Thyroid disease   . Depression   . GERD (gastroesophageal reflux disease)   . Hyperlipidemia   . Hypertension   . Allergy   . Menopausal symptom   . Common migraine   . Osteopenia   . Insomnia     Past Surgical History  Procedure Laterality Date  . Tubal ligation      Family Psychiatric History:  Pt stated "I dont know ".   Family History:  Family History  Problem Relation Age of Onset  . Diabetes Mother   . Heart disease Mother   . Cirrhosis Mother   . Heart disease Father     Social History:   Social History   Social History  . Marital Status: Married    Spouse Name: N/A  . Number of Children: N/A  . Years of Education: N/A   Social History Main Topics  . Smoking status: Never Smoker   . Smokeless tobacco: Never Used  . Alcohol Use: No  . Drug Use: No  . Sexual Activity: No   Other Topics Concern  . None   Social History Narrative    Additional Social History:  Work  at [employer] x 19 years. She works in first shift.  Lives by herself.  She denied using any drugs or alcohol. She denied pending legal charges  Allergies:  No Known Allergies  Metabolic Disorder Labs: No results found for: HGBA1C, MPG No results found for: PROLACTIN Lab Results  Component Value Date   CHOL 246* 04/13/2015   TRIG 219* 04/13/2015   HDL 39* 04/13/2015   LDLCALC 163* 04/13/2015     Current Medications: Current Outpatient Prescriptions  Medication Sig  Dispense Refill  . Cyanocobalamin (VITAMIN B-12 PO) Take 1 tablet by mouth daily.    . DULoxetine (CYMBALTA) 60 MG capsule Take 1 capsule (60 mg total) by mouth daily. 90 capsule 1  . levothyroxine (SYNTHROID, LEVOTHROID) 50 MCG tablet Take 1 tablet (50 mcg total) by mouth at bedtime as needed. 30 tablet 2  . LORazepam (ATIVAN) 0.5 MG tablet Take 1 tablet (0.5 mg total) by mouth at bedtime. As needed 30 tablet 1  . omeprazole (PRILOSEC) 20 MG capsule Take 20 mg by mouth daily.    . simvastatin (ZOCOR) 40 MG tablet Take 1 tablet (40 mg total) by mouth daily. 90 tablet 1  . SUMAtriptan (IMITREX) 50 MG tablet Take 50 mg by mouth every 2 (two) hours as needed for migraine. May repeat in 2 hours if headache persists or recurs.     No current facility-administered medications for this visit.    Neurologic: Headache: No Seizure: No Paresthesias:No  Musculoskeletal: Strength & Muscle Tone: within normal limits Gait & Station: normal Patient leans: N/A  Psychiatric Specialty Exam: ROS  Blood pressure 124/78, pulse 90, temperature 97.5 F (36.4 C), temperature source Tympanic, height  (1.626 m), weight 196 lb 3.2 oz (88.996 kg), SpO2 95 %.Body mass index is 33.66 kg/(m^2).  General Appearance: Disheveled  Eye Contact:  Fair  Speech:  Clear and Coherent and Slow  Volume:  Decreased  Mood:  Depressed and Dysphoric  Affect:  Congruent, Depressed and Tearful  Thought Process:  Circumstantial  Orientation:  Full (Time, Place, and Person)  Thought Content:  WDL  Suicidal Thoughts:  No  Homicidal Thoughts:  No  Memory:  Immediate;   Fair  Judgement:  Fair  Insight:  Fair  Psychomotor Activity:  Psychomotor Retardation  Concentration:  Fair  Recall:  [employer] of Knowledge:Fair  Language: Fair  Akathisia:  No  Handed:  Right  AIMS (if indicated):    Assets:  Communication Skills Desire for Improvement Physical Health Social Support  ADL's:  Intact  Cognition: WNL  Sleep:   good    Treatment Plan Summary: Medication management   Discussed with patient about her medications treatment risk benefits and alternatives I will decrease lorazepam 0.25 mg by mouth daily at bedtime. I also checked the [facility] controlled drug registry for her.  She has filled her prescription on 05/16/15 and I am not sure how many pills she has remaining at home as she looks very tired.   She will continue on Cymbalta 60 mg daily. Advised her to start taking Cymbalta in the morning.  Patient will follow up in 2 weeks or earlier depending on her symptoms.  She has also missed several appointments with a therapist at our office   More than 50% of the time spent in psychoeducation, counseling and coordination of care.    This note was generated in part or whole with voice recognition software. Voice regonition is usually quite accurate but there are transcription errors that  can and very often do occur. I apologize for any typographical errors that were not detected and corrected.    Brandy HaleUzma Nailani Full, MD 4/26/20173:08 PM

## 2015-05-26 ENCOUNTER — Telehealth: Payer: Self-pay | Admitting: Unknown Physician Specialty

## 2015-05-26 ENCOUNTER — Encounter: Payer: Self-pay | Admitting: Unknown Physician Specialty

## 2015-05-26 ENCOUNTER — Encounter: Payer: BLUE CROSS/BLUE SHIELD | Admitting: Unknown Physician Specialty

## 2015-05-26 NOTE — Telephone Encounter (Signed)
I wrote one that is in the chart.  It says that she cannot drive greater than 50 miles.  Will this work?

## 2015-05-26 NOTE — Telephone Encounter (Signed)
Pt came by stated she would like Elnita Maxwellheryl to write a note for herself and her husband. [facility]. The facility is unwilling to move him closer to home without a medical reason. Pt stated she has bad nerves and can not take the long drives (3 hour drive). Pt needs this note ASAP so she can submit it to the Corrections office. Thanks.

## 2015-05-26 NOTE — Telephone Encounter (Signed)
Routing to provider  

## 2015-05-27 NOTE — Telephone Encounter (Signed)
Called patient and told her what the letter Kayla Wright wrote said. Patient stated that was fine and I told her she could pick it up at the front desk.

## 2015-06-02 ENCOUNTER — Other Ambulatory Visit: Payer: Self-pay | Admitting: Unknown Physician Specialty

## 2015-06-02 ENCOUNTER — Encounter: Payer: Self-pay | Admitting: Unknown Physician Specialty

## 2015-06-02 ENCOUNTER — Ambulatory Visit (INDEPENDENT_AMBULATORY_CARE_PROVIDER_SITE_OTHER): Payer: BLUE CROSS/BLUE SHIELD | Admitting: Unknown Physician Specialty

## 2015-06-02 VITALS — BP 131/73 | HR 80 | Temp 98.2°F | Ht 64.8 in | Wt 194.2 lb

## 2015-06-02 DIAGNOSIS — M545 Low back pain, unspecified: Secondary | ICD-10-CM

## 2015-06-02 MED ORDER — TRAMADOL HCL 50 MG PO TABS: 50.0000 mg | ORAL_TABLET | Freq: Three times a day (TID) | ORAL | Status: DC | PRN

## 2015-06-02 MED ORDER — CYCLOBENZAPRINE HCL 10 MG PO TABS: 10.0000 mg | ORAL_TABLET | Freq: Three times a day (TID) | ORAL | Status: DC | PRN

## 2015-06-02 MED ORDER — DICLOFENAC SODIUM 75 MG PO TBEC: 75.0000 mg | DELAYED_RELEASE_TABLET | Freq: Two times a day (BID) | ORAL | Status: DC

## 2015-06-02 NOTE — Assessment & Plan Note (Signed)
Pt unable to take time off of workd due to needing to pay bills.  Well rx NSAID, muscle relaxants, and Tramadol

## 2015-06-02 NOTE — Progress Notes (Signed)
   BP 131/73 mmHg  Pulse 80  Temp(Src) 98.2 F (36.8 C)  Ht 5' 4.8" (1.646 m)  Wt 194 lb 3.2 oz (88.089 kg)  BMI 32.51 kg/m2  SpO2 98%   Subjective:    Patient ID: Kayla Wright, female    DOB: 12/05/1955, 60 y.o.   MRN: [id number]  HPI: Kayla Wright is a 60 y.o. female  Chief Complaint  Patient presents with  . Back Pain    pt states she has had pain in her mid to lower back since Saturday   Back Pain This is a new problem. The problem occurs constantly. The problem is unchanged. The quality of the pain is described as aching. The pain does not radiate. The pain is severe. The pain is the same all the time. Exacerbated by: nothing. Stiffness is present all day. Pertinent negatives include no bladder incontinence or bowel incontinence. She has tried NSAIDs for the symptoms. The treatment provided no relief.     Relevant past medical, surgical, family and social history reviewed and updated as indicated. Interim medical history since our last visit reviewed. Allergies and medications reviewed and updated.  Review of Systems  Gastrointestinal: Negative for bowel incontinence.  Genitourinary: Negative for bladder incontinence.  Musculoskeletal: Positive for back pain.    Per HPI unless specifically indicated above     Objective:    BP 131/73 mmHg  Pulse 80  Temp(Src) 98.2 F (36.8 C)  Ht 5' 4.8" (1.646 m)  Wt 194 lb 3.2 oz (88.089 kg)  BMI 32.51 kg/m2  SpO2 98%  Wt Readings from Last 3 Encounters:  06/02/15 194 lb 3.2 oz (88.089 kg)  05/20/15 196 lb 3.2 oz (88.996 kg)  04/13/15 195 lb (88.451 kg)    Physical Exam  Constitutional: She is oriented to person, place, and time. She appears well-developed and well-nourished. No distress.  HENT:  Head: Normocephalic and atraumatic.  Eyes: Conjunctivae and lids are normal. Right eye exhibits no discharge. Left eye exhibits no discharge. No scleral icterus.  Cardiovascular: Normal rate.   Pulmonary/Chest: Effort normal.   Abdominal: Normal appearance. There is no splenomegaly or hepatomegaly.  Musculoskeletal:       Lumbar back: She exhibits decreased range of motion and pain. She exhibits no tenderness, no bony tenderness and no swelling.  Neurological: She is alert and oriented to person, place, and time. She has normal strength and normal reflexes.  Skin: Skin is intact. No rash noted. No pallor.  Psychiatric: She has a normal mood and affect. Her behavior is normal. Judgment and thought content normal.   Assessment & Plan:   Problem List Items Addressed This Visit      Unprioritized   Low back pain - Primary    Pt unable to take time off of workd due to needing to pay bills.  Well rx NSAID, muscle relaxants, and Tramadol      Relevant Medications   diclofenac (VOLTAREN) 75 MG EC tablet   cyclobenzaprine (FLEXERIL) 10 MG tablet   traMADol (ULTRAM) 50 MG tablet       Follow up plan: Return if symptoms worsen or fail to improve.

## 2015-06-02 NOTE — Patient Instructions (Signed)
Back Pain, Adult °Back pain is very common in adults. The cause of back pain is rarely dangerous and the pain often gets better over time. The cause of your back pain may not be known. Some common causes of back pain include: °1. Strain of the muscles or ligaments supporting the spine. °2. Wear and tear (degeneration) of the spinal disks. °3. Arthritis. °4. Direct injury to the back. °For many people, back pain may return. Since back pain is rarely dangerous, most people can learn to manage this condition on their own. °HOME CARE INSTRUCTIONS °Watch your back pain for any changes. The following actions may help to lessen any discomfort you are feeling: °1. Remain active. It is stressful on your back to sit or stand in one place for long periods of time. Do not sit, drive, or stand in one place for more than 30 minutes at a time. Take short walks on even surfaces as soon as you are able. Try to increase the length of time you walk each day. °2. Exercise regularly as directed by your health care provider. Exercise helps your back heal faster. It also helps avoid future injury by keeping your muscles strong and flexible. °3. Do not stay in bed. Resting more than 1-2 days can delay your recovery. °4. Pay attention to your body when you bend and lift. The most comfortable positions are those that put less stress on your recovering back. Always use proper lifting techniques, including: °1. Bending your knees. °2. Keeping the load close to your body. °3. Avoiding twisting. °5. Find a comfortable position to sleep. Use a firm mattress and lie on your side with your knees slightly bent. If you lie on your back, put a pillow under your knees. °6. Avoid feeling anxious or stressed. Stress increases muscle tension and can worsen back pain. It is important to recognize when you are anxious or stressed and learn ways to manage it, such as with exercise. °7. Take medicines only as directed by your health care provider.  Over-the-counter medicines to reduce pain and inflammation are often the most helpful. Your health care provider may prescribe muscle relaxant drugs. These medicines help dull your pain so you can more quickly return to your normal activities and healthy exercise. °8. Apply ice to the injured area: °1. Put ice in a plastic bag. °2. Place a towel between your skin and the bag. °3. Leave the ice on for 20 minutes, 2-3 times a day for the first 2-3 days. After that, ice and heat may be alternated to reduce pain and spasms. °9. Maintain a healthy weight. Excess weight puts extra stress on your back and makes it difficult to maintain good posture. °SEEK MEDICAL CARE IF: °1. You have pain that is not relieved with rest or medicine. °2. You have increasing pain going down into the legs or buttocks. °3. You have pain that does not improve in one week. °4. You have night pain. °5. You lose weight. °6. You have a fever or chills. °SEEK IMMEDIATE MEDICAL CARE IF:  °1. You develop new bowel or bladder control problems. °2. You have unusual weakness or numbness in your arms or legs. °3. You develop nausea or vomiting. °4. You develop abdominal pain. °5. You feel faint. °  °This information is not intended to replace advice given to you by your health care provider. Make sure you discuss any questions you have with your health care provider. °  °Document Released: 01/10/2005 Document Revised: 01/31/2014 Document Reviewed: 05/14/2013 °Elsevier Interactive Patient Education ©2016 [employer]   Inc[employer] ° °Back Exercises °The following exercises strengthen the muscles that help to support the back. They also help to keep the lower back flexible. Doing these exercises can help to prevent back pain or lessen existing pain. °If you have back pain or discomfort, try doing these exercises 2-3 times each day or as told by your health care provider. When the pain goes away, do them once each day, but increase the number of times that you repeat the  steps for each exercise (do more repetitions). If you do not have back pain or discomfort, do these exercises once each day or as told by your health care provider. °EXERCISES °Single Knee to Chest °Repeat these steps 3-5 times for each leg: °5. Lie on your back on a firm bed or the floor with your legs extended. °6. Bring one knee to your chest. Your other leg should stay extended and in contact with the floor. °7. Hold your knee in place by grabbing your knee or thigh. °8. Pull on your knee until you feel a gentle stretch in your lower back. °9. Hold the stretch for 10-30 seconds. °10. Slowly release and straighten your leg. °Pelvic Tilt °Repeat these steps 5-10 times: °10. Lie on your back on a firm bed or the floor with your legs extended. °11. Bend your knees so they are pointing toward the ceiling and your feet are flat on the floor. °12. Tighten your lower abdominal muscles to press your lower back against the floor. This motion will tilt your pelvis so your tailbone points up toward the ceiling instead of pointing to your feet or the floor. °13. With gentle tension and even breathing, hold this position for 5-10 seconds. °Cat-Cow °Repeat these steps until your lower back becomes more flexible: °7. Get into a hands-and-knees position on a firm surface. Keep your hands under your shoulders, and keep your knees under your hips. You may place padding under your knees for comfort. °8. Let your head hang down, and point your tailbone toward the floor so your lower back becomes rounded like the back of a cat. °9. Hold this position for 5 seconds. °10. Slowly lift your head and point your tailbone up toward the ceiling so your back forms a sagging arch like the back of a cow. °11. Hold this position for 5 seconds. °Press-Ups °Repeat these steps 5-10 times: °6. Lie on your abdomen (face-down) on the floor. °7. Place your palms near your head, about shoulder-width apart. °8. While you keep your back as relaxed as  possible and keep your hips on the floor, slowly straighten your arms to raise the top half of your body and lift your shoulders. Do not use your back muscles to raise your upper torso. You may adjust the placement of your hands to make yourself more comfortable. °9. Hold this position for 5 seconds while you keep your back relaxed. °10. Slowly return to lying flat on the floor. °Bridges °Repeat these steps 10 times: °1. Lie on your back on a firm surface. °2. Bend your knees so they are pointing toward the ceiling and your feet are flat on the floor. °3. Tighten your buttocks muscles and lift your buttocks off of the floor until your waist is at almost the same height as your knees. You should feel the muscles working in your buttocks and the back of your thighs. If you do not feel these muscles, slide your feet 1-2 inches farther away from your buttocks. °4. Hold this   position for 3-5 seconds. °5. Slowly lower your hips to the starting position, and allow your buttocks muscles to relax completely. °If this exercise is too easy, try doing it with your arms crossed over your chest. °Abdominal Crunches °Repeat these steps 5-10 times: °1. Lie on your back on a firm bed or the floor with your legs extended. °2. Bend your knees so they are pointing toward the ceiling and your feet are flat on the floor. °3. Cross your arms over your chest. °4. Tip your chin slightly toward your chest without bending your neck. °5. Tighten your abdominal muscles and slowly raise your trunk (torso) high enough to lift your shoulder blades a tiny bit off of the floor. Avoid raising your torso higher than that, because it can put too much stress on your low back and it does not help to strengthen your abdominal muscles. °6. Slowly return to your starting position. °Back Lifts °Repeat these steps 5-10 times: °1. Lie on your abdomen (face-down) with your arms at your sides, and rest your forehead on the floor. °2. Tighten the muscles in your  legs and your buttocks. °3. Slowly lift your chest off of the floor while you keep your hips pressed to the floor. Keep the back of your head in line with the curve in your back. Your eyes should be looking at the floor. °4. Hold this position for 3-5 seconds. °5. Slowly return to your starting position. °SEEK MEDICAL CARE IF: °· Your back pain or discomfort gets much worse when you do an exercise. °· Your back pain or discomfort does not lessen within 2 hours after you exercise. °If you have any of these problems, stop doing these exercises right away. Do not do them again unless your health care provider says that you can. °SEEK IMMEDIATE MEDICAL CARE IF: °· You develop sudden, severe back pain. If this happens, stop doing the exercises right away. Do not do them again unless your health care provider says that you can. °  °This information is not intended to replace advice given to you by your health care provider. Make sure you discuss any questions you have with your health care provider. °  °Document Released: 02/18/2004 Document Revised: 10/01/2014 Document Reviewed: 03/06/2014 °Elsevier Interactive Patient Education ©2016 [employer] ° °

## 2015-06-03 ENCOUNTER — Ambulatory Visit (INDEPENDENT_AMBULATORY_CARE_PROVIDER_SITE_OTHER): Payer: BLUE CROSS/BLUE SHIELD | Admitting: Psychiatry

## 2015-06-03 ENCOUNTER — Encounter: Payer: Self-pay | Admitting: Psychiatry

## 2015-06-03 VITALS — BP 120/76 | HR 94 | Temp 97.7°F | Ht 64.0 in | Wt 195.6 lb

## 2015-06-03 DIAGNOSIS — F333 Major depressive disorder, recurrent, severe with psychotic symptoms: Secondary | ICD-10-CM

## 2015-06-03 MED ORDER — BENZTROPINE MESYLATE 1 MG PO TABS: 1.0000 mg | ORAL_TABLET | Freq: Every day | ORAL | Status: DC

## 2015-06-03 MED ORDER — ARIPIPRAZOLE 10 MG PO TABS: 10.0000 mg | ORAL_TABLET | Freq: Every day | ORAL | Status: DC

## 2015-06-03 NOTE — Progress Notes (Signed)
Psychiatric Initial Adult Assessment   Patient Identification: Kayla Wright MRN:  [id number] Date of Evaluation:  06/03/2015 Referral Source:  Gabriel Cirri, NP @ [facility] Chief Complaint:   Chief Complaint    Follow-up; Medication Refill; Hallucinations; Dizziness     Visit Diagnosis:  MDD Severe with Psychotic features   History of Present Illness:    Patient is a 60 year old female presented for Follow-up. Patient reported that she has been becoming more depressed since her last appointment. Patient reported that she has been living by herself and has been having hallucinations. She feels that the medications are not helping. She stated that most of the time at her home and has been staying in the bed. Patient reported that she has not changed her medications and has been not improving. She does not remember the names of her medications.  We discussed about the medication dosages and she was unable to answer me any questions. She reported that she is not having any suicidal homicidal ideations or plans. She reported that she currently lives by herself and her son lives close by. She reported that she is currently widowed and her boyfriend is going to be incarcerated for the next 6 years..  She appeared sad and tearful during the interview Patient currently denied having any suicidal homicidal ideations or plans. She denied having any perceptual disturbances.  Associated Signs/Symptoms: Depression Symptoms:  depressed mood, psychomotor retardation, fatigue, feelings of worthlessness/guilt, hopelessness, anxiety, panic attacks, loss of energy/fatigue, disturbed sleep, (Hypo) Manic Symptoms:  Irritable Mood, Labiality of Mood, Anxiety Symptoms:  Excessive Worry, Psychotic Symptoms:  Hallucinations: Visual PTSD Symptoms: Negative NA  Past Psychiatric History:   Patient has been diagnosed with anxiety and panic and was getting lorazepam from her primary care physician. She  was really having a difficult time answering questions as she was very sleepy during the interview. She reported that she takes Cymbalta and lorazepam at bedtime.  Previous Psychotropic Medications:  Cymbalta  Lorazepam  Klonopin.  Substance Abuse History in the last 12 months:  No.  Consequences of Substance Abuse: Negative NA  Past Medical History:  Past Medical History  Diagnosis Date  . Thyroid disease   . Depression   . GERD (gastroesophageal reflux disease)   . Hyperlipidemia   . Hypertension   . Allergy   . Menopausal symptom   . Common migraine   . Osteopenia   . Insomnia     Past Surgical History  Procedure Laterality Date  . Tubal ligation      Family Psychiatric History:  Pt stated "I dont know ".   Family History:  Family History  Problem Relation Age of Onset  . Diabetes Mother   . Heart disease Mother   . Cirrhosis Mother   . Heart disease Father     Social History:   Social History   Social History  . Marital Status: Married    Spouse Name: N/A  . Number of Children: N/A  . Years of Education: N/A   Social History Main Topics  . Smoking status: Never Smoker   . Smokeless tobacco: Never Used  . Alcohol Use: No  . Drug Use: No  . Sexual Activity: No   Other Topics Concern  . None   Social History Narrative    Additional Social History:  Work at [employer] x 19 years. She works in first shift.  Lives by herself.  She denied using any drugs or alcohol. She denied pending legal charges  Allergies:  No Known Allergies  Metabolic Disorder Labs: No results found for: HGBA1C, MPG No results found for: PROLACTIN Lab Results  Component Value Date   CHOL 246* 04/13/2015   TRIG 219* 04/13/2015   HDL 39* 04/13/2015   LDLCALC 163* 04/13/2015     Current Medications: Current Outpatient Prescriptions  Medication Sig Dispense Refill  . Cholecalciferol (VITAMIN D3) 5000 units CAPS Take 5,000 Units by mouth daily.    . Cyanocobalamin  (VITAMIN B-12 PO) Take 1 tablet by mouth daily.    . cyclobenzaprine (FLEXERIL) 10 MG tablet Take 1 tablet (10 mg total) by mouth 3 (three) times daily as needed for muscle spasms. 30 tablet 0  . diclofenac (VOLTAREN) 75 MG EC tablet Take 1 tablet (75 mg total) by mouth 2 (two) times daily. 30 tablet 0  . DULoxetine (CYMBALTA) 60 MG capsule Take 1 capsule (60 mg total) by mouth daily. 90 capsule 1  . Iron-Vitamins (GERITOL PO) Take by mouth daily.    Marland Kitchen levothyroxine (SYNTHROID, LEVOTHROID) 50 MCG tablet Take 1 tablet (50 mcg total) by mouth at bedtime as needed. 30 tablet 2  . levothyroxine (SYNTHROID, LEVOTHROID) 50 MCG tablet TAKE ONE TABLET BY MOUTH AT BEDTIME AS NEEDED 30 tablet 0  . LORazepam (ATIVAN) 0.5 MG tablet Take 0.5 tablets (0.25 mg total) by mouth at bedtime. Pt has supply 30 tablet 1  . omeprazole (PRILOSEC) 20 MG capsule Take 20 mg by mouth daily.    . simvastatin (ZOCOR) 40 MG tablet Take 1 tablet (40 mg total) by mouth daily. 90 tablet 1  . SUMAtriptan (IMITREX) 50 MG tablet Take 50 mg by mouth every 2 (two) hours as needed for migraine. May repeat in 2 hours if headache persists or recurs.    . traMADol (ULTRAM) 50 MG tablet Take 1 tablet (50 mg total) by mouth every 8 (eight) hours as needed. 30 tablet 0  . ARIPiprazole (ABILIFY) 10 MG tablet Take 1 tablet (10 mg total) by mouth daily. 30 tablet 0  . benztropine (COGENTIN) 1 MG tablet Take 1 tablet (1 mg total) by mouth daily. 30 tablet 1   No current facility-administered medications for this visit.    Neurologic: Headache: No Seizure: No Paresthesias:No  Musculoskeletal: Strength & Muscle Tone: within normal limits Gait & Station: normal Patient leans: N/A  Psychiatric Specialty Exam: ROS   Blood pressure 120/76, pulse 94, temperature 97.7 F (36.5 C), temperature source Tympanic, height  (1.626 m), weight 195 lb 9.6 oz (88.724 kg), SpO2 96 %.Body mass index is 33.56 kg/(m^2).  General Appearance:  Disheveled  Eye Contact:  Fair  Speech:  Clear and Coherent and Slow  Volume:  Decreased  Mood:  Depressed and Dysphoric  Affect:  Congruent, Depressed and Tearful  Thought Process:  Circumstantial  Orientation:  Full (Time, Place, and Person)  Thought Content:  WDL  Suicidal Thoughts:  No  Homicidal Thoughts:  No  Memory:  Immediate;   Fair  Judgement:  Fair  Insight:  Fair  Psychomotor Activity:  Psychomotor Retardation  Concentration:  Fair  Recall:  [employer] of Knowledge:Fair  Language: Fair  Akathisia:  No  Handed:  Right  AIMS (if indicated):    Assets:  Communication Skills Desire for Improvement Physical Health Social Support  ADL's:  Intact  Cognition: WNL  Sleep:  good    Treatment Plan Summary: Medication management   Discussed with patient about her medications treatment risk benefits and alternatives I will decrease lorazepam 0.25 mg  by mouth daily at bedtime. I also checked the [facility] [place] registry for her.  She has filled her prescription on 05/16/15 and I am not sure how many pills she has remaining at home as she looks very tired.   She will continue on Cymbalta 60 mg daily. Advised her to start taking Cymbalta in the morning. Start her on Abilify 10 mg by mouth daily and Cogentin 1 mg by mouth daily Discussed with her about the side effects of medication she demonstrated understanding  Patient will follow up in 2 weeks or earlier depending on her symptoms.     More than 50% of the time spent in psychoeducation, counseling and coordination of care.    This note was generated in part or whole with voice recognition software. Voice regonition is usually quite accurate but there are transcription errors that can and very often do occur. I apologize for any typographical errors that were not detected and corrected.    Brandy HaleUzma Jyl Chico, MD 5/10/20173:03 PM

## 2015-06-04 ENCOUNTER — Telehealth: Payer: Self-pay | Admitting: Unknown Physician Specialty

## 2015-06-04 NOTE — Telephone Encounter (Signed)
Pt called and would like to pick up her note taking her out of work.

## 2015-06-04 NOTE — Telephone Encounter (Signed)
Routing to provider  

## 2015-06-05 NOTE — Telephone Encounter (Signed)
Please write a note to be out till next Wednesday

## 2015-06-05 NOTE — Telephone Encounter (Signed)
Letter typed and printed. Will get Elnita MaxwellCheryl to sign and place up front for patient pick up.

## 2015-06-05 NOTE — Telephone Encounter (Signed)
Called and let patient know letter was ready to be picked up.

## 2015-06-16 ENCOUNTER — Emergency Department
Admission: EM | Admit: 2015-06-16 | Discharge: 2015-06-17 | Disposition: A | Discharge status: Home / Self Care | Payer: BLUE CROSS/BLUE SHIELD | Attending: Emergency Medicine | Admitting: Emergency Medicine

## 2015-06-16 ENCOUNTER — Encounter: Payer: Self-pay | Admitting: *Deleted

## 2015-06-16 ENCOUNTER — Emergency Department: Payer: BLUE CROSS/BLUE SHIELD

## 2015-06-16 DIAGNOSIS — Z79899 Other long term (current) drug therapy: Secondary | ICD-10-CM | POA: Insufficient documentation

## 2015-06-16 DIAGNOSIS — W010XXA Fall on same level from slipping, tripping and stumbling without subsequent striking against object, initial encounter: Secondary | ICD-10-CM | POA: Diagnosis not present

## 2015-06-16 DIAGNOSIS — Y939 Activity, unspecified: Secondary | ICD-10-CM | POA: Insufficient documentation

## 2015-06-16 DIAGNOSIS — S82892A Other fracture of left lower leg, initial encounter for closed fracture: Secondary | ICD-10-CM | POA: Diagnosis not present

## 2015-06-16 DIAGNOSIS — Y92007 Garden or yard of unspecified non-institutional (private) residence as the place of occurrence of the external cause: Secondary | ICD-10-CM | POA: Insufficient documentation

## 2015-06-16 DIAGNOSIS — E785 Hyperlipidemia, unspecified: Secondary | ICD-10-CM | POA: Insufficient documentation

## 2015-06-16 DIAGNOSIS — F329 Major depressive disorder, single episode, unspecified: Secondary | ICD-10-CM | POA: Diagnosis not present

## 2015-06-16 DIAGNOSIS — E039 Hypothyroidism, unspecified: Secondary | ICD-10-CM | POA: Diagnosis not present

## 2015-06-16 DIAGNOSIS — I10 Essential (primary) hypertension: Secondary | ICD-10-CM | POA: Insufficient documentation

## 2015-06-16 DIAGNOSIS — S99912A Unspecified injury of left ankle, initial encounter: Secondary | ICD-10-CM | POA: Diagnosis present

## 2015-06-16 DIAGNOSIS — Y999 Unspecified external cause status: Secondary | ICD-10-CM | POA: Insufficient documentation

## 2015-06-16 MED ORDER — FENTANYL CITRATE (PF) 100 MCG/2ML IJ SOLN
75.0000 ug | Freq: Once | INTRAMUSCULAR | Status: AC
  Administered 2015-06-16: 75 ug via INTRAVENOUS

## 2015-06-16 MED ORDER — ETOMIDATE 2 MG/ML IV SOLN
INTRAVENOUS | Status: AC
  Administered 2015-06-17: 3 mg via INTRAVENOUS
  Filled 2015-06-16: qty 10

## 2015-06-16 MED ORDER — FENTANYL CITRATE (PF) 100 MCG/2ML IJ SOLN
50.0000 ug | Freq: Once | INTRAMUSCULAR | Status: AC
  Administered 2015-06-17: 50 ug via INTRAVENOUS

## 2015-06-16 MED ORDER — FENTANYL CITRATE (PF) 100 MCG/2ML IJ SOLN
INTRAMUSCULAR | Status: AC
  Administered 2015-06-16: 75 ug via INTRAVENOUS
  Filled 2015-06-16: qty 2

## 2015-06-16 MED ORDER — FENTANYL CITRATE (PF) 100 MCG/2ML IJ SOLN
INTRAMUSCULAR | Status: AC
  Administered 2015-06-17: 50 ug via INTRAVENOUS
  Filled 2015-06-16: qty 2

## 2015-06-16 NOTE — ED Notes (Signed)
pt arrived to ED via EMS from home reporting left ankle pain after tripping in yard. Obvious deformity. Pulses intact. Brusing noted. Pt alert and oriented x 4 upon arrival.

## 2015-06-16 NOTE — ED Provider Notes (Signed)
[facility] Emergency Department Provider Note  ____________________________________________  Time seen: 11:10PM  I have reviewed the triage vital signs and the nursing notes.   HISTORY  Chief Complaint Ankle Pain      HPI Kayla Wright is a 60 y.o. female presents with left ankle pain swelling sp accident slip and fall before presentation emergency department. Current pain score 10 out of 10     Past Medical History  Diagnosis Date  . Thyroid disease   . Depression   . GERD (gastroesophageal reflux disease)   . Hyperlipidemia   . Hypertension   . Allergy   . Menopausal symptom   . Common migraine   . Osteopenia   . Insomnia     Patient Active Problem List   Diagnosis Date Noted  . Low back pain 06/02/2015  . Thoracic back pain 02/11/2015  . Severe depression 10/21/2014  . Chronic anxiety 10/21/2014  . Hyperlipidemia 10/21/2014  . Hypothyroidism 10/21/2014  . Sleep apnea 07/02/2014    Past Surgical History  Procedure Laterality Date  . Tubal ligation      Current Outpatient Rx  Name  Route  Sig  Dispense  Refill  . ARIPiprazole (ABILIFY) 10 MG tablet   Oral   Take 1 tablet (10 mg total) by mouth daily.   30 tablet   0   . benztropine (COGENTIN) 1 MG tablet   Oral   Take 1 tablet (1 mg total) by mouth daily.   30 tablet   1   . Cholecalciferol (VITAMIN D3) 5000 units CAPS   Oral   Take 5,000 Units by mouth daily.         . Cyanocobalamin (VITAMIN B-12 PO)   Oral   Take 1 tablet by mouth daily.         . cyclobenzaprine (FLEXERIL) 10 MG tablet   Oral   Take 1 tablet (10 mg total) by mouth 3 (three) times daily as needed for muscle spasms.   30 tablet   0   . diclofenac (VOLTAREN) 75 MG EC tablet   Oral   Take 1 tablet (75 mg total) by mouth 2 (two) times daily.   30 tablet   0   . DULoxetine (CYMBALTA) 60 MG capsule   Oral   Take 1 capsule (60 mg total) by mouth daily.   90 capsule   1   .  Iron-Vitamins (GERITOL PO)   Oral   Take by mouth daily.         Marland Kitchen levothyroxine (SYNTHROID, LEVOTHROID) 50 MCG tablet   Oral   Take 1 tablet (50 mcg total) by mouth at bedtime as needed.   30 tablet   2   . LORazepam (ATIVAN) 0.5 MG tablet   Oral   Take 0.5 tablets (0.25 mg total) by mouth at bedtime. Pt has supply   30 tablet   1   . omeprazole (PRILOSEC) 20 MG capsule   Oral   Take 20 mg by mouth daily.         . simvastatin (ZOCOR) 40 MG tablet   Oral   Take 1 tablet (40 mg total) by mouth daily.   90 tablet   1   . SUMAtriptan (IMITREX) 50 MG tablet   Oral   Take 50 mg by mouth every 2 (two) hours as needed for migraine. May repeat in 2 hours if headache persists or recurs.         . traMADol Janean Sark)  50 MG tablet   Oral   Take 1 tablet (50 mg total) by mouth every 8 (eight) hours as needed.   30 tablet   0   . levothyroxine (SYNTHROID, LEVOTHROID) 50 MCG tablet      TAKE ONE TABLET BY MOUTH AT BEDTIME AS NEEDED   30 tablet   0     Allergies No known drug allergies  Family History  Problem Relation Age of Onset  . Diabetes Mother   . Heart disease Mother   . Cirrhosis Mother   . Heart disease Father     Social History Social History  Substance Use Topics  . Smoking status: Never Smoker   . Smokeless tobacco: Never Used  . Alcohol Use: No    Review of Systems  Constitutional: Negative for fever. Eyes: Negative for visual changes. ENT: Negative for sore throat. Cardiovascular: Negative for chest pain. Respiratory: Negative for shortness of breath. Gastrointestinal: Negative for abdominal pain, vomiting and diarrhea. Genitourinary: Negative for dysuria. Musculoskeletal: Negative for back pain.Positive for left ankle pain swelling  Skin: Negative for rash. Neurological: Negative for headaches, focal weakness or numbness.   10-point ROS otherwise negative.  ____________________________________________   PHYSICAL EXAM:  VITAL  SIGNS: ED Triage Vitals  Enc Vitals Group     BP 06/16/15 2220 136/73 mmHg     Pulse Rate 06/16/15 2220 86     Resp 06/16/15 2220 20     Temp 06/16/15 2220 98.1 F (36.7 C)     Temp Source 06/16/15 2220 Oral     SpO2 06/16/15 2220 95 %     Weight 06/16/15 2220 194 lb (87.998 kg)     Height 06/16/15 2220 5\' 8"  (1.727 m)     Head Cir --      Peak Flow --      Pain Score 06/16/15 2221 10     Pain Loc --      Pain Edu? --      Excl. in GC? --      Constitutional: Alert and oriented. Well appearing and in no distress. Eyes: Conjunctivae are normal. PERRL. Normal extraocular movements. ENT   Head: Normocephalic and atraumatic.   Nose: No congestion/rhinnorhea.   Mouth/Throat: Mucous membranes are moist.   Neck: No stridor. Hematological/Lymphatic/Immunilogical: No cervical lymphadenopathy. Cardiovascular: Normal rate, regular rhythm. Normal and symmetric distal pulses are present in all extremities. No murmurs, rubs, or gallops. Respiratory: Normal respiratory effort without tachypnea nor retractions. Breath sounds are clear and equal bilaterally. No wheezes/rales/rhonchi. Gastrointestinal: Soft and nontender. No distention. There is no CVA tenderness. Genitourinary: deferred Musculoskeletal: Nontender with normal range of motion in all extremities. No joint effusions.  No lower extremity tenderness nor edema.Pain swelling and gross deformity noted neurovascularly intact Neurologic:  Normal speech and language. No gross focal neurologic deficits are appreciated. Speech is normal.  Skin:  Skin is warm, dry and intact. No rash noted. Psychiatric: Mood and affect are normal. Speech and behavior are normal. Patient exhibits appropriate insight and judgment.    RADIOLOGY  DG Ankle Complete Left (Final result) Result time: 06/17/15 01:02:05   Final result by Rad Results In Interface (06/17/15 01:02:05)   Narrative:   CLINICAL DATA: Fracture,  postreduction.  EXAM: LEFT ANKLE COMPLETE - 3+ VIEW  COMPARISON: Pre reduction exam yesterday.  FINDINGS: Lateral talar subluxation with talar tilt, unchanged in alignment from prior. Fracture fragment arising from posterior talus is now appreciated projecting over the medial malleolus, and not visualized on the oblique  or lateral views. No new fracture. Overlying cast material in place.  IMPRESSION: Unchanged alignment of talar subluxation postreduction, with persistent lateral displacement and talar tilt. The fracture fragment arising from the posterior talus now projects over the medial malleolus.   Electronically Signed By: Rubye Oaks M.D. On: 06/17/2015 01:02          DG Ankle Complete Left (Final result) Result time: 06/16/15 23:22:52   Final result by Rad Results In Interface (06/16/15 23:22:52)   Narrative:   CLINICAL DATA: Left ankle pain and deformity after fall  EXAM: LEFT ANKLE COMPLETE - 3+ VIEW  COMPARISON: None.  FINDINGS: There is prominent diffuse left ankle soft tissue swelling. There is abnormal lateral angulation of the talus relative to the distal left tibial plafond with approximately 1 cm lateral subluxation of the talus relative to the distal left tibial plafond. There is a comminuted fracture of the left posterior talus with approximately 1 cm posterior displacement of the dominant posterior fracture fragment, which measures 1.3 cm in size. No additional fracture is seen. Tiny Achilles and small plantar left calcaneal spurs. No suspicious focal osseous lesions.  IMPRESSION: 1. Comminuted posterior left talus fracture as described. 2. Lateral angulation and 1 cm lateral subluxation of the left talus relative to the distal left tibial plafond.   Electronically Signed By: Delbert Phenix M.D. On: 06/16/2015 23:22        ____________________________________________   PROCEDURES  Procedure(s) performed:  Reduction of dislocation Date/Time: 1:54 AM Performed by: Darci Current Authorized by: Darci Current Consent: Verbal consent obtained. Risks and benefits: risks, benefits and alternatives were discussed Consent given by: patient Required items: required blood products, implants, devices, and special equipment available Time out: Immediately prior to procedure a "time out" was called to verify the correct patient, procedure, equipment, support staff and site/side marked as required.  Patient sedated: With etomidate and fentanyl  Vitals: Vital signs were monitored during sedation. Patient tolerance: Patient tolerated the procedure well with no immediate complications. Joint: Left ankle Reduction technique: Traction      ____________________________________________   INITIAL IMPRESSION / ASSESSMENT AND PLAN / ED COURSE  Pertinent labs & imaging results that were available during my care of the patient were reviewed by me and considered in my medical decision making (see chart for details).  Patient discussed with Dr. Hyacinth Meeker who recommended left ankle reduction with splint placement which was performed. Repeat x-ray showed persistent subluxation which was conveyed to Dr. Hyacinth Meeker who recommended outpatient follow-up in one day (Thursday). Patient prescribed Percocet for pain at home  ____________________________________________   FINAL CLINICAL IMPRESSION(S) / ED DIAGNOSES  Final diagnoses:  Closed left ankle fracture, initial encounter      Darci Current, MD 06/17/15 [phone]

## 2015-06-17 ENCOUNTER — Emergency Department: Payer: BLUE CROSS/BLUE SHIELD

## 2015-06-17 MED ORDER — OXYCODONE-ACETAMINOPHEN 5-325 MG PO TABS: 1.0000 | ORAL_TABLET | ORAL | Status: DC | PRN

## 2015-06-17 MED ORDER — OXYCODONE-ACETAMINOPHEN 5-325 MG PO TABS
2.0000 | ORAL_TABLET | Freq: Once | ORAL | Status: AC
  Administered 2015-06-17: 2 via ORAL
  Filled 2015-06-17: qty 2

## 2015-06-17 MED ORDER — ETOMIDATE 2 MG/ML IV SOLN
3.0000 mg | Freq: Once | INTRAVENOUS | Status: AC
  Administered 2015-06-17: 3 mg via INTRAVENOUS

## 2015-06-17 NOTE — ED Notes (Signed)
Crutches given per MD request. Teaching complete and pt verbalized understanding of safe use.

## 2015-06-18 ENCOUNTER — Ambulatory Visit: Payer: BLUE CROSS/BLUE SHIELD | Admitting: Psychiatry

## 2015-06-18 HISTORY — PX: ANKLE FRACTURE SURGERY: SHX122

## 2015-06-23 ENCOUNTER — Encounter: Payer: Self-pay | Admitting: Unknown Physician Specialty

## 2015-06-23 ENCOUNTER — Ambulatory Visit (INDEPENDENT_AMBULATORY_CARE_PROVIDER_SITE_OTHER): Payer: BLUE CROSS/BLUE SHIELD | Admitting: Unknown Physician Specialty

## 2015-06-23 VITALS — BP 101/66 | HR 105 | Temp 98.2°F

## 2015-06-23 DIAGNOSIS — S82892D Other fracture of left lower leg, subsequent encounter for closed fracture with routine healing: Secondary | ICD-10-CM

## 2015-06-23 NOTE — Progress Notes (Signed)
BP 101/66 mmHg  Pulse 105  Temp(Src) 98.2 F (36.8 C)  Ht   Wt   SpO2 96%   Subjective:    Patient ID: Kayla Wright, female    DOB: 01/09/1956, 60 y.o.   MRN: [id number]  HPI: Kayla Wright is a 60 y.o. female  Chief Complaint  Patient presents with  . surgery follow up    pt states she had emergency ankle surgery on Thursday in [place]   Pt is here accompanied by her sister.    Larey SeatFell in her yard exactly one week ago.  Presented to the ER and 2 days later (5 days ago) had surgery.  I have the Ortho notes pre surgery, but not the ones post op.  She did stay "overnight."  She is taking Oxycodone "without Tylenol."  She wonders if there is "anyway she can get disability as she will be out of work for "a while."  She would also like a temporary disability tag for her car.  Her sister does live across the street.  She was not offered a stay at the rehab center despite living alone  Relevant past medical, surgical, family and social history reviewed and updated as indicated. Interim medical history since our last visit reviewed. Allergies and medications reviewed and updated.  Review of Systems  Per HPI unless specifically indicated above     Objective:    BP 101/66 mmHg  Pulse 105  Temp(Src) 98.2 F (36.8 C)  Ht   Wt   SpO2 96%  Wt Readings from Last 3 Encounters:  06/16/15 194 lb (87.998 kg)  06/03/15 195 lb 9.6 oz (88.724 kg)  06/02/15 194 lb 3.2 oz (88.089 kg)    Physical Exam  Constitutional: She is oriented to person, place, and time. She appears well-developed and well-nourished. No distress.  HENT:  Head: Normocephalic and atraumatic.  Eyes: Conjunctivae and lids are normal. Right eye exhibits no discharge. Left eye exhibits no discharge. No scleral icterus.  Neck: Normal range of motion. Neck supple. No JVD present. Carotid bruit is not present.  Cardiovascular: Normal rate, regular rhythm and normal heart sounds.   Pulmonary/Chest: Effort normal and breath  sounds normal.  Abdominal: Normal appearance. There is no splenomegaly or hepatomegaly.  Musculoskeletal:  Left leg casted and straight  Neurological: She is alert and oriented to person, place, and time.  Skin: Skin is warm, dry and intact. No rash noted. No pallor.  Psychiatric: She has a normal mood and affect. Her behavior is normal. Judgment and thought content normal.    Results for orders placed or performed in visit on 04/13/15  Comprehensive metabolic panel  Result Value Ref Range   Glucose 101 (H) 65 - 99 mg/dL   BUN 10 6 - 24 mg/dL   Creatinine, Ser 4.091.00 0.57 - 1.00 mg/dL   GFR calc non Af Amer 62 >59 mL/min/1.73   GFR calc Af Amer 71 >59 mL/min/1.73   BUN/Creatinine Ratio 10 9 - 23   Sodium 140 134 - 144 mmol/L   Potassium 4.2 3.5 - 5.2 mmol/L   Chloride 99 96 - 106 mmol/L   CO2 22 18 - 29 mmol/L   Calcium 9.2 8.7 - 10.2 mg/dL   Total Protein 7.1 6.0 - 8.5 g/dL   Albumin 4.4 3.5 - 5.5 g/dL   Globulin, Total 2.7 1.5 - 4.5 g/dL   Albumin/Globulin Ratio 1.6 1.2 - 2.2   Bilirubin Total 0.5 0.0 - 1.2 mg/dL   Alkaline  Phosphatase 101 39 - 117 IU/L   AST 28 0 - 40 IU/L   ALT 24 0 - 32 IU/L  TSH  Result Value Ref Range   TSH 2.560 0.450 - 4.500 uIU/mL  VITAMIN D 25 Hydroxy (Vit-D Deficiency, Fractures)  Result Value Ref Range   Vit D, 25-Hydroxy 16.8 (L) 30.0 - 100.0 ng/mL  Lipid Panel w/o Chol/HDL Ratio  Result Value Ref Range   Cholesterol, Total 246 (H) 100 - 199 mg/dL   Triglycerides 161 (H) 0 - 149 mg/dL   HDL 39 (L) >09 mg/dL   VLDL Cholesterol Cal 44 (H) 5 - 40 mg/dL   LDL Calculated 604 (H) 0 - 99 mg/dL      Assessment & Plan:   Problem List Items Addressed This Visit    None    Visit Diagnoses    Ankle fracture, left, closed, with routine healing, subsequent encounter    -  Primary    Relevant Orders    PT self-care home management       Discussed services through social services and [employer] wheels.  Resources given to sister as well as a  disability tag.  Pt will Wrote order for home PT.    Follow up plan: Return in about 4 weeks (around 07/21/2015).

## 2015-06-23 NOTE — Patient Instructions (Addendum)
Social services for [employer] Call [employer]

## 2015-06-29 ENCOUNTER — Telehealth: Payer: Self-pay | Admitting: Unknown Physician Specialty

## 2015-06-29 DIAGNOSIS — R011 Cardiac murmur, unspecified: Secondary | ICD-10-CM

## 2015-06-29 NOTE — Telephone Encounter (Signed)
Routing to provider. According to chart, the son's name is Reuel BoomDaniel and his phone number is [phone].

## 2015-06-29 NOTE — Telephone Encounter (Signed)
Pt called and stated that her son(listed on HIPPA in [place]) would like to speak to her PCP about her last visit.

## 2015-06-29 NOTE — Telephone Encounter (Signed)
Call from son discussing concern from surgeons about a heart murmer.  Will refer to cardiology for further evaluation.

## 2015-06-30 ENCOUNTER — Other Ambulatory Visit: Payer: Self-pay | Admitting: Unknown Physician Specialty

## 2015-06-30 ENCOUNTER — Ambulatory Visit: Payer: BLUE CROSS/BLUE SHIELD | Admitting: Psychiatry

## 2015-06-30 ENCOUNTER — Telehealth: Payer: Self-pay | Admitting: Unknown Physician Specialty

## 2015-06-30 DIAGNOSIS — S82891G Other fracture of right lower leg, subsequent encounter for closed fracture with delayed healing: Secondary | ICD-10-CM

## 2015-06-30 NOTE — Telephone Encounter (Signed)
pts son has a few questions for the pts PCP.

## 2015-06-30 NOTE — Telephone Encounter (Signed)
Discuss with son that long-term disability should be looked at secondary to talus fracture in which the surgeon states has a high rate of avascular necrosis.  I would like to get a social work evaluation for this patient.

## 2015-07-01 NOTE — Telephone Encounter (Signed)
Thanks.  Ask him to make an appt with me to help fill out the paperwork as I will need information about ADLs

## 2015-07-01 NOTE — Telephone Encounter (Signed)
Spoke with pts son and he is looking to place her at [facility] and he will bring in paperwork when he picks it up from the facility.

## 2015-07-08 ENCOUNTER — Telehealth: Payer: Self-pay | Admitting: Unknown Physician Specialty

## 2015-07-08 ENCOUNTER — Other Ambulatory Visit: Payer: Self-pay | Admitting: Unknown Physician Specialty

## 2015-07-08 ENCOUNTER — Encounter: Payer: Self-pay | Admitting: Unknown Physician Specialty

## 2015-07-08 NOTE — Telephone Encounter (Signed)
Pt stated she needed a letter stating that she is out of work to apply for financial assistance.

## 2015-07-08 NOTE — Telephone Encounter (Signed)
Routing to provider  

## 2015-07-10 ENCOUNTER — Telehealth: Payer: Self-pay

## 2015-07-10 ENCOUNTER — Other Ambulatory Visit: Payer: Self-pay | Admitting: Unknown Physician Specialty

## 2015-07-10 DIAGNOSIS — R011 Cardiac murmur, unspecified: Secondary | ICD-10-CM | POA: Insufficient documentation

## 2015-07-10 DIAGNOSIS — R0602 Shortness of breath: Secondary | ICD-10-CM | POA: Insufficient documentation

## 2015-07-10 NOTE — Telephone Encounter (Signed)
Just received phone call from [facility]. Their PT's last week is next week and he is fully booked.  They're supposed to get another PT in two weeks, but since patient's ankle is fracture, recommended forwarding to somewhere else.  [facility] Referral form filled out. Elnita MaxwellCheryl to sign but she is gone for the day, once she signs I will fax over 07/13/2015.

## 2015-07-17 ENCOUNTER — Telehealth: Payer: Self-pay

## 2015-07-17 NOTE — Telephone Encounter (Signed)
[facility]ittany from [facility] called regarding patient's referral to homehealth.  They have reached out to the patient and have spoke with her and her son. Both stated that she didn't Homehealth, she was going to a nursing facility.  GrenadaBrittany had asked if patient had gone yet and they were waiting for an insurance authorization. Patient was offered home services till she goes to the facility, but denied.

## 2015-08-06 ENCOUNTER — Telehealth: Payer: Self-pay | Admitting: Unknown Physician Specialty

## 2015-08-06 NOTE — Telephone Encounter (Signed)
Routing to provider  

## 2015-08-06 NOTE — Telephone Encounter (Signed)
Pt called stating her "sleeping pill", Elnita MaxwellCheryl gave heris not working at all.  She takes it at 8:30pm and is still awake at 1am.  I let her know Elnita MaxwellCheryl will not be in until tomorrow, pt stated that was fine.

## 2015-08-06 NOTE — Telephone Encounter (Deleted)
Pt states her "sleeping pill" is not helping at all

## 2015-08-07 NOTE — Telephone Encounter (Signed)
Kayla Wright, I confirmed the phone number to call back on when I spoke with the patient.

## 2015-08-07 NOTE — Telephone Encounter (Signed)
I am not comfortable with giving her sedating medications since she is taking pain medication.  She can try taking Melatonin at night

## 2015-08-07 NOTE — Telephone Encounter (Signed)
Called and let patient know what [name] said. Patient stated Elnita Maxwellshe was not on any pain medications so I placed the patient on hold and went and told [name]. Elnita MaxwellCheryl looked on the controlled substance database and saw that the patient was give 30 tablets of oxycodone on 07/31/15. I told the patient this and she stated she wasn't taking them. I explained to her that Elnita MaxwellCheryl could not give her sleeping medication when she has pain medication available. Patient just said OK.

## 2015-08-07 NOTE — Telephone Encounter (Signed)
Called and spoke to patient. She stated that she is at home.

## 2015-08-07 NOTE — Telephone Encounter (Signed)
Called and spoke to patient. She stated that she is not seeing the psychiatrist anymore. I asked if she planned to go back to see them and the patient stated no.

## 2015-08-07 NOTE — Telephone Encounter (Signed)
It looks to me like she is seeing a psychiatrist.  She should call her for this.

## 2015-08-07 NOTE — Telephone Encounter (Signed)
[place], can you find out where she is?  Is she at home or is she at rehab.   Thanks.

## 2015-08-10 ENCOUNTER — Other Ambulatory Visit: Payer: Self-pay | Admitting: Unknown Physician Specialty

## 2015-09-01 ENCOUNTER — Telehealth: Payer: Self-pay | Admitting: Unknown Physician Specialty

## 2015-09-01 NOTE — Telephone Encounter (Signed)
She would likely need to be seen for that.

## 2015-09-01 NOTE — Telephone Encounter (Signed)
Called and spoke to patient. Patient stated that Dr. Desmond DikeBen's nurse called and told her to ask her primary if she would prescribe the patient some pain medication. Patient stated that she started PT today and her ankle is hurting.

## 2015-09-02 NOTE — Telephone Encounter (Signed)
Called and left patient a voicemail letting her know that she would need to be seen to get pain medication. I gave her out call back number if she would like to schedule an appointment.

## 2015-09-04 ENCOUNTER — Ambulatory Visit: Payer: Self-pay | Admitting: Unknown Physician Specialty

## 2015-09-04 ENCOUNTER — Encounter: Payer: Self-pay | Admitting: Family Medicine

## 2015-09-04 ENCOUNTER — Ambulatory Visit (INDEPENDENT_AMBULATORY_CARE_PROVIDER_SITE_OTHER): Payer: BLUE CROSS/BLUE SHIELD | Admitting: Family Medicine

## 2015-09-04 VITALS — BP 109/69 | HR 72 | Temp 98.6°F | Wt 194.0 lb

## 2015-09-04 DIAGNOSIS — S82892D Other fracture of left lower leg, subsequent encounter for closed fracture with routine healing: Secondary | ICD-10-CM

## 2015-09-04 MED ORDER — TRAMADOL HCL 50 MG PO TABS: 50.0000 mg | ORAL_TABLET | Freq: Three times a day (TID) | ORAL | 0 refills | Status: DC | PRN

## 2015-09-04 NOTE — Patient Instructions (Signed)
Follow up as needed

## 2015-09-04 NOTE — Progress Notes (Signed)
   BP 109/69   Pulse 72   Temp 98.6 F (37 C)   Wt 194 lb (88 kg) Comment: wearing leg/ankle boot  SpO2 99%   BMI 29.50 kg/m    Subjective:    Patient ID: Kayla Wright, female    DOB: 07/12/1955, 60 y.o.   MRN: [id number]  HPI: Kayla Wright is a 60 y.o. female  Chief Complaint  Patient presents with  . Ankle Pain    she crushed her left ankle at the end of May. Has had 2 surgeries on it. Recently started PT and having alot of pain and ortho told her to see us for more pain meds. She doesn't go back to ortho until 9/8.   Patient presents for follow up regarding a closed left ankle fracture that occurred in May. She has been followed by orthopedics and has had several surgeries already and still may need more. Has been consistently on tramadol or oxycodone since event. Pain is very poorly controlled otherwise. Has been out of pain medicine and pain is 9/10 - she doesn't get into orthopedics again until 9/8 and they asked her to come see PCP to get her through until her appt.   Relevant past medical, surgical, family and social history reviewed and updated as indicated. Interim medical history since our last visit reviewed. Allergies and medications reviewed and updated.  Review of Systems  Constitutional: Negative.   Respiratory: Negative.   Cardiovascular: Negative.   Gastrointestinal: Negative.   Musculoskeletal: Positive for gait problem (Using crutch and large walking boot on left LE).  Psychiatric/Behavioral: Negative.     Per HPI unless specifically indicated above     Objective:    BP 109/69   Pulse 72   Temp 98.6 F (37 C)   Wt 194 lb (88 kg) Comment: wearing leg/ankle boot  SpO2 99%   BMI 29.50 kg/m   Wt Readings from Last 3 Encounters:  09/04/15 194 lb (88 kg)  06/16/15 194 lb (88 kg)  06/03/15 195 lb 9.6 oz (88.7 kg)    Physical Exam  Constitutional: She is oriented to person, place, and time. She appears well-developed and well-nourished. No distress.    HENT:  Head: Atraumatic.  Eyes: Conjunctivae are normal. Pupils are equal, round, and reactive to light. No scleral icterus.  Neck: Normal range of motion. Neck supple.  Cardiovascular: Normal rate.   Pulmonary/Chest: Effort normal. No respiratory distress.  Musculoskeletal:  Antalgic gait, using crutch and walking boot on left side  Neurological: She is alert and oriented to person, place, and time.  Skin: Skin is warm and dry.  Psychiatric: She has a normal mood and affect. Her behavior is normal.  Nursing note and vitals reviewed.     Assessment & Plan:   Problem List Items Addressed This Visit    None    Visit Diagnoses    Closed left ankle fracture, with routine healing, subsequent encounter    -  Primary   S/p two surgeries with possibility of more in future. Sent enough Tramadol to last until she can get in with orthopedics 9/8.        Follow up plan: Return if symptoms worsen or fail to improve.

## 2015-09-14 ENCOUNTER — Other Ambulatory Visit: Payer: Self-pay | Admitting: Unknown Physician Specialty

## 2015-09-14 NOTE — Telephone Encounter (Signed)
Your patient 

## 2015-10-14 ENCOUNTER — Other Ambulatory Visit: Payer: Self-pay | Admitting: Unknown Physician Specialty

## 2015-10-14 NOTE — Telephone Encounter (Signed)
Your patient 

## 2015-10-29 ENCOUNTER — Telehealth: Payer: Self-pay | Admitting: Unknown Physician Specialty

## 2015-11-02 ENCOUNTER — Encounter: Payer: Self-pay | Admitting: Unknown Physician Specialty

## 2015-11-02 ENCOUNTER — Ambulatory Visit (INDEPENDENT_AMBULATORY_CARE_PROVIDER_SITE_OTHER): Payer: BLUE CROSS/BLUE SHIELD | Admitting: Unknown Physician Specialty

## 2015-11-02 VITALS — BP 129/79 | HR 80 | Temp 99.0°F | Ht 65.7 in | Wt 195.6 lb

## 2015-11-02 DIAGNOSIS — G4700 Insomnia, unspecified: Secondary | ICD-10-CM | POA: Insufficient documentation

## 2015-11-02 DIAGNOSIS — F5101 Primary insomnia: Secondary | ICD-10-CM

## 2015-11-02 DIAGNOSIS — Z23 Encounter for immunization: Secondary | ICD-10-CM

## 2015-11-02 MED ORDER — ZOLPIDEM TARTRATE 10 MG PO TABS: 10.0000 mg | ORAL_TABLET | Freq: Every evening | ORAL | 1 refills | Status: DC | PRN

## 2015-11-02 NOTE — Patient Instructions (Addendum)
Influenza (Flu) Vaccine (Inactivated or Recombinant):  1. Why get vaccinated? Influenza ("flu") is a contagious disease that spreads around the [place] every year, usually between October and May. Flu is caused by influenza viruses, and is spread mainly by coughing, sneezing, and close contact. Anyone can get flu. Flu strikes suddenly and can last several days. Symptoms vary by age, but can include:  fever/chills  sore throat  muscle aches  fatigue  cough  headache  runny or stuffy nose Flu can also lead to pneumonia and blood infections, and cause diarrhea and seizures in children. If you have a medical condition, such as heart or lung disease, flu can make it worse. Flu is more dangerous for some people. Infants and young children, people 65 years of age and older, pregnant women, and people with certain health conditions or a weakened immune system are at greatest risk. Each year thousands of people in the [place] die from flu, and many more are hospitalized. Flu vaccine can:  keep you from getting flu,  make flu less severe if you do get it, and  keep you from spreading flu to your family and other people. 2. Inactivated and recombinant flu vaccines A dose of flu vaccine is recommended every flu season. Children 6 months through 8 years of age may need two doses during the same flu season. Everyone else needs only one dose each flu season. Some inactivated flu vaccines contain a very small amount of a mercury-based preservative called thimerosal. Studies have not shown thimerosal in vaccines to be harmful, but flu vaccines that do not contain thimerosal are available. There is no live flu virus in flu shots. They cannot cause the flu. There are many flu viruses, and they are always changing. Each year a new flu vaccine is made to protect against three or four viruses that are likely to cause disease in the upcoming flu season. But even when the vaccine doesn't exactly  match these viruses, it may still provide some protection. Flu vaccine cannot prevent:  flu that is caused by a virus not covered by the vaccine, or  illnesses that look like flu but are not. It takes about 2 weeks for protection to develop after vaccination, and protection lasts through the flu season. 3. Some people should not get this vaccine Tell the person who is giving you the vaccine:  If you have any severe, life-threatening allergies. If you ever had a life-threatening allergic reaction after a dose of flu vaccine, or have a severe allergy to any part of this vaccine, you may be advised not to get vaccinated. Most, but not all, types of flu vaccine contain a small amount of egg protein.  If you ever had Guillain-Barre Syndrome (also called GBS). Some people with a history of GBS should not get this vaccine. This should be discussed with your doctor.  If you are not feeling well. It is usually okay to get flu vaccine when you have a mild illness, but you might be asked to come back when you feel better. 4. Risks of a vaccine reaction With any medicine, including vaccines, there is a chance of reactions. These are usually mild and go away on their own, but serious reactions are also possible. Most people who get a flu shot do not have any problems with it. Minor problems following a flu shot include:  soreness, redness, or swelling where the shot was given  hoarseness  sore, red or itchy eyes  cough    fever  aches  headache  itching  fatigue If these problems occur, they usually begin soon after the shot and last 1 or 2 days. More serious problems following a flu shot can include the following:  There may be a small increased risk of Guillain-Barre Syndrome (GBS) after inactivated flu vaccine. This risk has been estimated at 1 or 2 additional cases per million people vaccinated. This is much lower than the risk of severe complications from flu, which can be prevented by  flu vaccine.  Young children who get the flu shot along with pneumococcal vaccine (PCV13) and/or DTaP vaccine at the same time might be slightly more likely to have a seizure caused by fever. Ask your doctor for more information. Tell your doctor if a child who is getting flu vaccine has ever had a seizure. Problems that could happen after any injected vaccine:  People sometimes faint after a medical procedure, including vaccination. Sitting or lying down for about 15 minutes can help prevent fainting, and injuries caused by a fall. Tell your doctor if you feel dizzy, or have vision changes or ringing in the ears.  Some people get severe pain in the shoulder and have difficulty moving the arm where a shot was given. This happens very rarely.  Any medication can cause a severe allergic reaction. Such reactions from a vaccine are very rare, estimated at about 1 in a million doses, and would happen within a few minutes to a few hours after the vaccination. As with any medicine, there is a very remote chance of a vaccine causing a serious injury or death. The safety of vaccines is always being monitored. For more information, visit: www.cdc.gov/vaccinesafety/ 5. What if there is a serious reaction? What should I look for?  Look for anything that concerns you, such as signs of a severe allergic reaction, very high fever, or unusual behavior. Signs of a severe allergic reaction can include hives, swelling of the face and throat, difficulty breathing, a fast heartbeat, dizziness, and weakness. These would start a few minutes to a few hours after the vaccination. What should I do?  If you think it is a severe allergic reaction or other emergency that can't wait, call 9-1-1 and get the person to the nearest hospital. Otherwise, call your doctor.  Reactions should be reported to the Vaccine Adverse Event Reporting System (VAERS). Your doctor should file this report, or you can do it yourself through the  VAERS web site at www.vaers.hhs.gov, or by calling 1-800-822-7967. VAERS does not give medical advice. 6. The National Vaccine Injury Compensation Program The National Vaccine Injury Compensation Program (VICP) is a federal program that was created to compensate people who may have been injured by certain vaccines. Persons who believe they may have been injured by a vaccine can learn about the program and about filing a claim by calling 1-800-338-2382 or visiting the VICP website at www.hrsa.gov/vaccinecompensation. There is a time limit to file a claim for compensation. 7. How can I learn more?  Ask your healthcare provider. He or she can give you the vaccine package insert or suggest other sources of information.  Call your local or state health department.  Contact the Centers for Disease Control and Prevention (CDC):  Call 1-800-232-4636 (1-800-CDC-INFO) or  Visit CDC's website at www.cdc.gov/flu Vaccine Information Statement Inactivated Influenza Vaccine (08/30/2013)   This information is not intended to replace advice given to you by your health care provider. Make sure you discuss any questions you have with   your health care provider.  --------------------------------------------------------------------------------------------------------------------------------  Take melatonin .5 to 1 mg an hour before bedtime with a Magnesium supplement.  I prefer Magnesium Citrate (I use something called Calm and Relax) which is better absorbed then the more common Magnesium Oxide.      Insomnia Insomnia is a sleep disorder that makes it difficult to fall asleep or to stay asleep. Insomnia can cause tiredness (fatigue), low energy, difficulty concentrating, mood swings, and poor performance at work or school.  There are three different ways to classify insomnia:  Difficulty falling asleep.  Difficulty staying asleep.  Waking up too early in the morning. Any type of insomnia can be  long-term (chronic) or short-term (acute). Both are common. Short-term insomnia usually lasts for three months or less. Chronic insomnia occurs at least three times a week for longer than three months. CAUSES  Insomnia may be caused by another condition, situation, or substance, such as:  Anxiety.  Certain medicines.  Gastroesophageal reflux disease (GERD) or other gastrointestinal conditions.  Asthma or other breathing conditions.  Restless legs syndrome, sleep apnea, or other sleep disorders.  Chronic pain.  Menopause. This may include hot flashes.  Stroke.  Abuse of alcohol, tobacco, or illegal drugs.  Depression.  Caffeine.   Neurological disorders, such as Alzheimer disease.  An overactive thyroid (hyperthyroidism). The cause of insomnia may not be known. RISK FACTORS Risk factors for insomnia include:  Gender. Women are more commonly affected than men.  Age. Insomnia is more common as you get older.  Stress. This may involve your professional or personal life.  Income. Insomnia is more common in people with lower income.  Lack of exercise.   Irregular work schedule or night shifts.  Traveling between different time zones. SIGNS AND SYMPTOMS If you have insomnia, trouble falling asleep or trouble staying asleep is the main symptom. This may lead to other symptoms, such as:  Feeling fatigued.  Feeling nervous about going to sleep.  Not feeling rested in the morning.  Having trouble concentrating.  Feeling irritable, anxious, or depressed. TREATMENT  Treatment for insomnia depends on the cause. If your insomnia is caused by an underlying condition, treatment will focus on addressing the condition. Treatment may also include:   Medicines to help you sleep.  Counseling or therapy.  Lifestyle adjustments. HOME CARE INSTRUCTIONS   Take medicines only as directed by your health care provider.  Keep regular sleeping and waking hours. Avoid  naps.  Keep a sleep diary to help you and your health care provider figure out what could be causing your insomnia. Include:   When you sleep.  When you wake up during the night.  How well you sleep.   How rested you feel the next day.  Any side effects of medicines you are taking.  What you eat and drink.   Make your bedroom a comfortable place where it is easy to fall asleep:  Put up shades or special blackout curtains to block light from outside.  Use a white noise machine to block noise.  Keep the temperature cool.   Exercise regularly as directed by your health care provider. Avoid exercising right before bedtime.  Use relaxation techniques to manage stress. Ask your health care provider to suggest some techniques that may work well for you. These may include:  Breathing exercises.  Routines to release muscle tension.  Visualizing peaceful scenes.  Cut back on alcohol, caffeinated beverages, and cigarettes, especially close to bedtime. These can disrupt your sleep.  Do not overeat or eat spicy foods right before bedtime. This can lead to digestive discomfort that can make it hard for you to sleep.  Limit screen use before bedtime. This includes:  Watching TV.  Using your smartphone, tablet, and computer.  Stick to a routine. This can help you fall asleep faster. Try to do a quiet activity, brush your teeth, and go to bed at the same time each night.  Get out of bed if you are still awake after 15 minutes of trying to sleep. Keep the lights down, but try reading or doing a quiet activity. When you feel sleepy, go back to bed.  Make sure that you drive carefully. Avoid driving if you feel very sleepy.  Keep all follow-up appointments as directed by your health care provider. This is important. SEEK MEDICAL CARE IF:   You are tired throughout the day or have trouble in your daily routine due to sleepiness.  You continue to have sleep problems or your sleep  problems get worse. SEEK IMMEDIATE MEDICAL CARE IF:   You have serious thoughts about hurting yourself or someone else.   This information is not intended to replace advice given to you by your health care provider. Make sure you discuss any questions you have with your health care provider.   Document Released: 01/08/2000 Document Revised: 10/01/2014 Document Reviewed: 10/11/2013 Elsevier Interactive Patient Education [employer]

## 2015-11-02 NOTE — Progress Notes (Signed)
BP 129/79 (BP Location: Left Arm, Patient Position: Sitting, Cuff Size: Large)   Pulse 80   Temp 99 F (37.2 C)   Ht 5' 5.7" (1.669 m)   Wt 195 lb 9.6 oz (88.7 kg)   SpO2 98%   BMI 31.86 kg/m    Subjective:    Patient ID: Kayla Wright, female    DOB: 07/03/1955, 60 y.o.   MRN: [id number]  HPI: Kayla Wright is a 60 y.o. female  Chief Complaint  Patient presents with  . Insomnia    pt states she has not been able to go to sleep or stay asleep for about 3 weeks now    Insomnia Pt states she has not been able to get to sleep or stay asleep.  Goes to bed around 8:30 or 9:00 and stays awake for 3 hours and dozes off for a short period of time.  She gets out of bed 8:30 or 9A.  She does not drink caffeine.  She tried an OTC sleeping aid without any help.  She is not exercising due to s/p broken ankle.    Relevant past medical, surgical, family and social history reviewed and updated as indicated. Interim medical history since our last visit reviewed. Allergies and medications reviewed and updated.  Review of Systems  Per HPI unless specifically indicated above     Objective:    BP 129/79 (BP Location: Left Arm, Patient Position: Sitting, Cuff Size: Large)   Pulse 80   Temp 99 F (37.2 C)   Ht 5' 5.7" (1.669 m)   Wt 195 lb 9.6 oz (88.7 kg)   SpO2 98%   BMI 31.86 kg/m   Wt Readings from Last 3 Encounters:  11/02/15 195 lb 9.6 oz (88.7 kg)  09/04/15 194 lb (88 kg)  06/16/15 194 lb (88 kg)    Physical Exam  Constitutional: She is oriented to person, place, and time. She appears well-developed and well-nourished. No distress.  HENT:  Head: Normocephalic and atraumatic.  Eyes: Conjunctivae and lids are normal. Right eye exhibits no discharge. Left eye exhibits no discharge. No scleral icterus.  Cardiovascular: Normal rate.   Pulmonary/Chest: Effort normal.  Abdominal: Normal appearance. There is no splenomegaly or hepatomegaly.  Musculoskeletal: Normal range of motion.    Neurological: She is alert and oriented to person, place, and time.  Skin: Skin is intact. No rash noted. No pallor.  Psychiatric: She has a normal mood and affect. Her behavior is normal. Judgment and thought content normal.    Results for orders placed or performed in visit on 04/13/15  Comprehensive metabolic panel  Result Value Ref Range   Glucose 101 (H) 65 - 99 mg/dL   BUN 10 6 - 24 mg/dL   Creatinine, Ser 4.091.00 0.57 - 1.00 mg/dL   GFR calc non Af Amer 62 >59 mL/min/1.73   GFR calc Af Amer 71 >59 mL/min/1.73   BUN/Creatinine Ratio 10 9 - 23   Sodium 140 134 - 144 mmol/L   Potassium 4.2 3.5 - 5.2 mmol/L   Chloride 99 96 - 106 mmol/L   CO2 22 18 - 29 mmol/L   Calcium 9.2 8.7 - 10.2 mg/dL   Total Protein 7.1 6.0 - 8.5 g/dL   Albumin 4.4 3.5 - 5.5 g/dL   Globulin, Total 2.7 1.5 - 4.5 g/dL   Albumin/Globulin Ratio 1.6 1.2 - 2.2   Bilirubin Total 0.5 0.0 - 1.2 mg/dL   Alkaline Phosphatase 101 39 - 117 IU/L  AST 28 0 - 40 IU/L   ALT 24 0 - 32 IU/L  TSH  Result Value Ref Range   TSH 2.560 0.450 - 4.500 uIU/mL  VITAMIN D 25 Hydroxy (Vit-D Deficiency, Fractures)  Result Value Ref Range   Vit D, 25-Hydroxy 16.8 (L) 30.0 - 100.0 ng/mL  Lipid Panel w/o Chol/HDL Ratio  Result Value Ref Range   Cholesterol, Total 246 (H) 100 - 199 mg/dL   Triglycerides 782 (H) 0 - 149 mg/dL   HDL 39 (L) >95 mg/dL   VLDL Cholesterol Cal 44 (H) 5 - 40 mg/dL   LDL Calculated 621 (H) 0 - 99 mg/dL      Assessment & Plan:   Problem List Items Addressed This Visit      Unprioritized   Insomnia    Discussed sleep hygeine.  Limited prescription of Ambien to take 1/2 prn sleep.         Other Visit Diagnoses    Need for influenza vaccination    -  Primary   Relevant Orders   Flu Vaccine QUAD 36+ mos IM (Completed)       Follow up plan: Return if symptoms worsen or fail to improve.

## 2015-11-02 NOTE — Assessment & Plan Note (Signed)
Discussed sleep hygeine.  Limited prescription of Ambien to take 1/2 prn sleep.

## 2015-11-05 NOTE — Telephone Encounter (Signed)
Pt called to make an appt because she is having trouble sleeping at night. Appt was made for patient.

## 2015-11-05 NOTE — Telephone Encounter (Signed)
Pt called to make an appt this telephone call is a duplicate. Pt called to make an appt for not being able to sleep. The appt was made.

## 2015-11-13 ENCOUNTER — Other Ambulatory Visit: Payer: Self-pay | Admitting: Unknown Physician Specialty

## 2015-11-13 NOTE — Telephone Encounter (Signed)
Your patient 

## 2015-11-17 ENCOUNTER — Other Ambulatory Visit: Payer: Self-pay | Admitting: Unknown Physician Specialty

## 2015-12-14 ENCOUNTER — Other Ambulatory Visit: Payer: Self-pay | Admitting: Unknown Physician Specialty

## 2015-12-29 ENCOUNTER — Other Ambulatory Visit: Payer: Self-pay | Admitting: General Practice

## 2015-12-29 ENCOUNTER — Ambulatory Visit
Admission: RE | Admit: 2015-12-29 | Discharge: 2015-12-29 | Disposition: A | Discharge status: Home / Self Care | Payer: Disability Insurance | Source: Ambulatory Visit | Attending: General Practice | Admitting: General Practice

## 2015-12-29 DIAGNOSIS — T148XXA Other injury of unspecified body region, initial encounter: Secondary | ICD-10-CM

## 2015-12-29 DIAGNOSIS — S92102A Unspecified fracture of left talus, initial encounter for closed fracture: Secondary | ICD-10-CM | POA: Diagnosis not present

## 2015-12-29 DIAGNOSIS — R609 Edema, unspecified: Secondary | ICD-10-CM | POA: Diagnosis not present

## 2015-12-29 DIAGNOSIS — X58XXXA Exposure to other specified factors, initial encounter: Secondary | ICD-10-CM | POA: Insufficient documentation

## 2015-12-31 ENCOUNTER — Telehealth: Payer: Self-pay | Admitting: Unknown Physician Specialty

## 2015-12-31 NOTE — Telephone Encounter (Signed)
Patient called requesting a note she would like in regards to her difficulty with walking due to her ankle problem.  She also is requesting something to help her with her anxiety/depression. She can be reached at [phone]

## 2016-01-01 NOTE — Telephone Encounter (Signed)
Called and left patient a VM letting her know that Elnita MaxwellCheryl said she needed an appointment. I asked for the patient to please return my call to schedule a visit. VM was personalized.

## 2016-01-01 NOTE — Telephone Encounter (Signed)
Called and left patient a VM asking for her to please return my call. I asked if she needed a letter to be written for a specific place or person. Will route to provider. Kayla MaxwellCheryl is is OK to write letter and please address medication request also.

## 2016-01-01 NOTE — Telephone Encounter (Signed)
Will need to be seen for this

## 2016-01-13 ENCOUNTER — Other Ambulatory Visit: Payer: Self-pay | Admitting: Unknown Physician Specialty

## 2016-01-20 ENCOUNTER — Other Ambulatory Visit: Payer: Self-pay | Admitting: Unknown Physician Specialty

## 2016-01-27 ENCOUNTER — Ambulatory Visit (INDEPENDENT_AMBULATORY_CARE_PROVIDER_SITE_OTHER): Payer: BLUE CROSS/BLUE SHIELD | Admitting: Unknown Physician Specialty

## 2016-01-27 ENCOUNTER — Encounter: Payer: Self-pay | Admitting: Unknown Physician Specialty

## 2016-01-27 VITALS — BP 118/73 | HR 83 | Temp 98.5°F | Wt 204.2 lb

## 2016-01-27 DIAGNOSIS — M79605 Pain in left leg: Secondary | ICD-10-CM

## 2016-01-27 DIAGNOSIS — E785 Hyperlipidemia, unspecified: Secondary | ICD-10-CM

## 2016-01-27 DIAGNOSIS — E038 Other specified hypothyroidism: Secondary | ICD-10-CM

## 2016-01-27 DIAGNOSIS — Z23 Encounter for immunization: Secondary | ICD-10-CM

## 2016-01-27 DIAGNOSIS — F322 Major depressive disorder, single episode, severe without psychotic features: Secondary | ICD-10-CM

## 2016-01-27 NOTE — Progress Notes (Signed)
BP 118/73 (BP Location: Left Arm, Patient Position: Sitting, Cuff Size: Large)   Pulse 83   Temp 98.5 F (36.9 C)   Wt 204 lb 3.2 oz (92.6 kg)   SpO2 98%   BMI 33.26 kg/m    Subjective:    Patient ID: Kayla Wright, female    DOB: [birth year], 61 y.o.   MRN: [id number]  HPI: Kayla Wright is a 61 y.o. female  Chief Complaint  Patient presents with  . Letter for School/Work    pt states she needs a letter written stating that she cannot drive for long distances because of her ankle, states her husband is about 3 hours away and she cannot drive that long   . Depression   Leg pain Pt s/p compound ankle fracture May 2017.  She is at work for 3 hours/day.  She states she can't drive longer than 1 hour. She would like a note to state so her husband can move to a prison closer to home.  Currently living on her own and checked on by son and neighbors.    Depression Currently not doing well with her depression.  She is taking Duloxetine and Clonazepam 1 mg daily. States her depression is made worse with holidays, being by herself, and not doing what she wants to do.  No active suicidal ideation.   Depression screen [facility] 2/9 01/27/2016 04/13/2015  Decreased Interest 3 3  Down, Depressed, Hopeless 3 3  PHQ - 2 Score 6 6  Altered sleeping 3 3  Tired, decreased energy 3 3  Change in appetite 2 2  Feeling bad or failure about yourself  2 3  Trouble concentrating 2 2  Moving slowly or fidgety/restless 2 3  Suicidal thoughts 2 3  PHQ-9 Score 22 25   Hypertension Using medications without difficulty Average home BPs Not checking   No problems or lightheadedness No chest pain with exertion or shortness of breath No Edema  Hyperlipidemia Using medications without problems No Muscle aches   Relevant past medical, surgical, family and social history reviewed and updated as indicated. Interim medical history since our last visit reviewed. Allergies and medications reviewed and  updated.  Review of Systems  Per HPI unless specifically indicated above     Objective:    BP 118/73 (BP Location: Left Arm, Patient Position: Sitting, Cuff Size: Large)   Pulse 83   Temp 98.5 F (36.9 C)   Wt 204 lb 3.2 oz (92.6 kg)   SpO2 98%   BMI 33.26 kg/m   Wt Readings from Last 3 Encounters:  01/27/16 204 lb 3.2 oz (92.6 kg)  11/02/15 195 lb 9.6 oz (88.7 kg)  09/04/15 194 lb (88 kg)    Physical Exam  Constitutional: She is oriented to person, place, and time. She appears well-developed and well-nourished. No distress.  HENT:  Head: Normocephalic and atraumatic.  Eyes: Conjunctivae and lids are normal. Right eye exhibits no discharge. Left eye exhibits no discharge. No scleral icterus.  Neck: Normal range of motion. Neck supple. No JVD present. Carotid bruit is not present.  Cardiovascular: Normal rate, regular rhythm and normal heart sounds.   Pulmonary/Chest: Effort normal and breath sounds normal.  Abdominal: Normal appearance. There is no splenomegaly or hepatomegaly.  Musculoskeletal: Normal range of motion.  Neurological: She is alert and oriented to person, place, and time.  Skin: Skin is warm, dry and intact. No rash noted. No pallor.  Psychiatric: She has a normal mood and affect.  Her behavior is normal. Judgment and thought content normal.    Results for orders placed or performed in visit on 04/13/15  Comprehensive metabolic panel  Result Value Ref Range   Glucose 101 (H) 65 - 99 mg/dL   BUN 10 6 - 24 mg/dL   Creatinine, Ser 1.611.00 0.57 - 1.00 mg/dL   GFR calc non Af Amer 62 >59 mL/min/1.73   GFR calc Af Amer 71 >59 mL/min/1.73   BUN/Creatinine Ratio 10 9 - 23   Sodium 140 134 - 144 mmol/L   Potassium 4.2 3.5 - 5.2 mmol/L   Chloride 99 96 - 106 mmol/L   CO2 22 18 - 29 mmol/L   Calcium 9.2 8.7 - 10.2 mg/dL   Total Protein 7.1 6.0 - 8.5 g/dL   Albumin 4.4 3.5 - 5.5 g/dL   Globulin, Total 2.7 1.5 - 4.5 g/dL   Albumin/Globulin Ratio 1.6 1.2 - 2.2    Bilirubin Total 0.5 0.0 - 1.2 mg/dL   Alkaline Phosphatase 101 39 - 117 IU/L   AST 28 0 - 40 IU/L   ALT 24 0 - 32 IU/L  TSH  Result Value Ref Range   TSH 2.560 0.450 - 4.500 uIU/mL  VITAMIN D 25 Hydroxy (Vit-D Deficiency, Fractures)  Result Value Ref Range   Vit D, 25-Hydroxy 16.8 (L) 30.0 - 100.0 ng/mL  Lipid Panel w/o Chol/HDL Ratio  Result Value Ref Range   Cholesterol, Total 246 (H) 100 - 199 mg/dL   Triglycerides 096219 (H) 0 - 149 mg/dL   HDL 39 (L) >04>39 mg/dL   VLDL Cholesterol Cal 44 (H) 5 - 40 mg/dL   LDL Calculated 540163 (H) 0 - 99 mg/dL      Assessment & Plan:   Problem List Items Addressed This Visit      Unprioritized   Hyperlipidemia   Relevant Orders   Comprehensive metabolic panel   Lipid Panel w/o Chol/HDL Ratio   Hypothyroidism   Relevant Orders   TSH   Leg pain    Secondary to fracture of left ankle.  Recovering well but mobility limitations especially in driving.        Severe depression (HCC)    Discussed no new medications today.  Refer to counseling.  List of names given      Relevant Orders   Comprehensive metabolic panel   VITAMIN D 25 Hydroxy (Vit-D Deficiency, Fractures)    Other Visit Diagnoses    Need for diphtheria-tetanus-pertussis (Tdap) vaccine, adult/adolescent    -  Primary   Relevant Orders   Tdap vaccine greater than or equal to 7yo IM (Completed)       Follow up plan: Return in about 6 weeks (around 03/09/2016).

## 2016-01-27 NOTE — Assessment & Plan Note (Signed)
Secondary to fracture of left ankle.  Recovering well but mobility limitations especially in driving.

## 2016-01-27 NOTE — Assessment & Plan Note (Addendum)
Discussed no new medications today.  Refer to counseling.  List of names given

## 2016-01-27 NOTE — Patient Instructions (Signed)
Tdap Vaccine (Tetanus, Diphtheria and Pertussis): What You Need to Know 1. Why get vaccinated? Tetanus, diphtheria and pertussis are very serious diseases. Tdap vaccine can protect us from these diseases. And, Tdap vaccine given to pregnant women can protect newborn babies against pertussis. TETANUS (Lockjaw) is rare in the [place] today. It causes painful muscle tightening and stiffness, usually all over the body.  It can lead to tightening of muscles in the head and neck so you can't open your mouth, swallow, or sometimes even breathe. Tetanus kills about 1 out of 10 people who are infected even after receiving the best medical care.  DIPHTHERIA is also rare in the [place] today. It can cause a thick coating to form in the back of the throat.  It can lead to breathing problems, heart failure, paralysis, and death.  PERTUSSIS (Whooping Cough) causes severe coughing spells, which can cause difficulty breathing, vomiting and disturbed sleep.  It can also lead to weight loss, incontinence, and rib fractures. Up to 2 in 100 adolescents and 5 in 100 adults with pertussis are hospitalized or have complications, which could include pneumonia or death.  These diseases are caused by bacteria. Diphtheria and pertussis are spread from person to person through secretions from coughing or sneezing. Tetanus enters the body through cuts, scratches, or wounds. Before vaccines, as many as 200,000 cases of diphtheria, 200,000 cases of pertussis, and hundreds of cases of tetanus, were reported in the [place] each year. Since vaccination began, reports of cases for tetanus and diphtheria have dropped by about 99% and for pertussis by about 80%. 2. Tdap vaccine Tdap vaccine can protect adolescents and adults from tetanus, diphtheria, and pertussis. One dose of Tdap is routinely given at age 11 or 12. People who did not get Tdap at that age should get it as soon as possible. Tdap is especially  important for healthcare professionals and anyone having close contact with a baby younger than 12 months. Pregnant women should get a dose of Tdap during every pregnancy, to protect the newborn from pertussis. Infants are most at risk for severe, life-threatening complications from pertussis. Another vaccine, called Td, protects against tetanus and diphtheria, but not pertussis. A Td booster should be given every 10 years. Tdap may be given as one of these boosters if you have never gotten Tdap before. Tdap may also be given after a severe cut or burn to prevent tetanus infection. Your doctor or the person giving you the vaccine can give you more information. Tdap may safely be given at the same time as other vaccines. 3. Some people should not get this vaccine  A person who has ever had a life-threatening allergic reaction after a previous dose of any diphtheria, tetanus or pertussis containing vaccine, OR has a severe allergy to any part of this vaccine, should not get Tdap vaccine. Tell the person giving the vaccine about any severe allergies.  Anyone who had coma or long repeated seizures within 7 days after a childhood dose of DTP or DTaP, or a previous dose of Tdap, should not get Tdap, unless a cause other than the vaccine was found. They can still get Td.  Talk to your doctor if you: ? have seizures or another nervous system problem, ? had severe pain or swelling after any vaccine containing diphtheria, tetanus or pertussis, ? ever had a condition called Guillain-Barr Syndrome (GBS), ? aren't feeling well on the day the shot is scheduled. 4. Risks With any medicine, including   vaccines, there is a chance of side effects. These are usually mild and go away on their own. Serious reactions are also possible but are rare. Most people who get Tdap vaccine do not have any problems with it. Mild problems following Tdap: (Did not interfere with activities)  Pain where the shot was given (about  3 in 4 adolescents or 2 in 3 adults)  Redness or swelling where the shot was given (about 1 person in 5)  Mild fever of at least 100.4F (up to about 1 in 25 adolescents or 1 in 100 adults)  Headache (about 3 or 4 people in 10)  Tiredness (about 1 person in 3 or 4)  Nausea, vomiting, diarrhea, stomach ache (up to 1 in 4 adolescents or 1 in 10 adults)  Chills, sore joints (about 1 person in 10)  Body aches (about 1 person in 3 or 4)  Rash, swollen glands (uncommon)  Moderate problems following Tdap: (Interfered with activities, but did not require medical attention)  Pain where the shot was given (up to 1 in 5 or 6)  Redness or swelling where the shot was given (up to about 1 in 16 adolescents or 1 in 12 adults)  Fever over 102F (about 1 in 100 adolescents or 1 in 250 adults)  Headache (about 1 in 7 adolescents or 1 in 10 adults)  Nausea, vomiting, diarrhea, stomach ache (up to 1 or 3 people in 100)  Swelling of the entire arm where the shot was given (up to about 1 in 500).  Severe problems following Tdap: (Unable to perform usual activities; required medical attention)  Swelling, severe pain, bleeding and redness in the arm where the shot was given (rare).  Problems that could happen after any vaccine:  People sometimes faint after a medical procedure, including vaccination. Sitting or lying down for about 15 minutes can help prevent fainting, and injuries caused by a fall. Tell your doctor if you feel dizzy, or have vision changes or ringing in the ears.  Some people get severe pain in the shoulder and have difficulty moving the arm where a shot was given. This happens very rarely.  Any medication can cause a severe allergic reaction. Such reactions from a vaccine are very rare, estimated at fewer than 1 in a million doses, and would happen within a few minutes to a few hours after the vaccination. As with any medicine, there is a very remote chance of a vaccine  causing a serious injury or death. The safety of vaccines is always being monitored. For more information, visit: www.cdc.gov/vaccinesafety/ 5. What if there is a serious problem? What should I look for? Look for anything that concerns you, such as signs of a severe allergic reaction, very high fever, or unusual behavior. Signs of a severe allergic reaction can include hives, swelling of the face and throat, difficulty breathing, a fast heartbeat, dizziness, and weakness. These would usually start a few minutes to a few hours after the vaccination. What should I do?  If you think it is a severe allergic reaction or other emergency that can't wait, call 9-1-1 or get the person to the nearest hospital. Otherwise, call your doctor.  Afterward, the reaction should be reported to the Vaccine Adverse Event Reporting System (VAERS). Your doctor might file this report, or you can do it yourself through the VAERS web site at www.vaers.hhs.gov, or by calling 1-800-822-7967. ? VAERS does not give medical advice. 6. The National Vaccine Injury Compensation Program The National   Vaccine Injury Compensation Program (VICP) is a federal program that was created to compensate people who may have been injured by certain vaccines. Persons who believe they may have been injured by a vaccine can learn about the program and about filing a claim by calling 1-800-338-2382 or visiting the VICP website at www.hrsa.gov/vaccinecompensation. There is a time limit to file a claim for compensation. 7. How can I learn more?  Ask your doctor. He or she can give you the vaccine package insert or suggest other sources of information.  Call your local or state health department.  Contact the Centers for Disease Control and Prevention (CDC): ? Call 1-800-232-4636 (1-800-CDC-INFO) or ? Visit CDC's website at www.cdc.gov/vaccines CDC Tdap Vaccine VIS (03/19/13) This information is not intended to replace advice given to you by your  health care provider. Make sure you discuss any questions you have with your health care provider. Document Released: 07/12/2011 Document Revised: 10/01/2015 Document Reviewed: 10/01/2015 Elsevier Interactive Patient Education  2017 Elsevier Inc.  

## 2016-01-28 LAB — COMPREHENSIVE METABOLIC PANEL
ALT: 16 IU/L (ref 0–32)
AST: 18 IU/L (ref 0–40)
Albumin/Globulin Ratio: 2.3 — ABNORMAL HIGH (ref 1.2–2.2)
Albumin: 4.6 g/dL (ref 3.5–5.5)
Alkaline Phosphatase: 117 IU/L (ref 39–117)
BUN/Creatinine Ratio: 8 — ABNORMAL LOW (ref 9–23)
BUN: 9 mg/dL (ref 6–24)
Bilirubin Total: 0.7 mg/dL (ref 0.0–1.2)
CO2: 24 mmol/L (ref 18–29)
Calcium: 8.7 mg/dL (ref 8.7–10.2)
Chloride: 102 mmol/L (ref 96–106)
Creatinine, Ser: 1.14 mg/dL — ABNORMAL HIGH (ref 0.57–1.00)
GFR calc Af Amer: 61 mL/min/{1.73_m2} (ref >59)
GFR calc non Af Amer: 53 mL/min/{1.73_m2} — ABNORMAL LOW (ref >59)
Globulin, Total: 2 g/dL (ref 1.5–4.5)
Glucose: 87 mg/dL (ref 65–99)
Potassium: 3.6 mmol/L (ref 3.5–5.2)
Sodium: 144 mmol/L (ref 134–144)
Total Protein: 6.6 g/dL (ref 6.0–8.5)

## 2016-01-28 LAB — VITAMIN D 25 HYDROXY (VIT D DEFICIENCY, FRACTURES): Vit D, 25-Hydroxy: 39.5 ng/mL (ref 30.0–100.0)

## 2016-01-28 LAB — TSH: TSH: 2.09 u[IU]/mL (ref 0.450–4.500)

## 2016-01-28 LAB — LIPID PANEL W/O CHOL/HDL RATIO
Cholesterol, Total: 188 mg/dL (ref 100–199)
HDL: 43 mg/dL (ref >39)
LDL Calculated: 108 mg/dL — ABNORMAL HIGH (ref 0–99)
Triglycerides: 183 mg/dL — ABNORMAL HIGH (ref 0–149)
VLDL Cholesterol Cal: 37 mg/dL (ref 5–40)

## 2016-01-29 ENCOUNTER — Encounter: Payer: Self-pay | Admitting: Unknown Physician Specialty

## 2016-02-15 ENCOUNTER — Other Ambulatory Visit: Payer: Self-pay | Admitting: Unknown Physician Specialty

## 2016-02-17 ENCOUNTER — Other Ambulatory Visit: Payer: Self-pay | Admitting: Family Medicine

## 2016-02-18 NOTE — Telephone Encounter (Signed)
Routing to provider. No follow up on file. 

## 2016-02-23 ENCOUNTER — Telehealth: Payer: Self-pay | Admitting: Unknown Physician Specialty

## 2016-02-23 NOTE — Telephone Encounter (Signed)
Routing to provider. I looked on [facility]'s website at their $4 medication list and for cholesterol, the only medication that came up was lovastatin.

## 2016-02-24 MED ORDER — LOVASTATIN 40 MG PO TABS: 40.0000 mg | ORAL_TABLET | Freq: Every day | ORAL | 3 refills | Status: DC

## 2016-02-24 NOTE — Telephone Encounter (Signed)
Lovastatin called in

## 2016-02-24 NOTE — Telephone Encounter (Signed)
Called and let patient know about medication change. While on the phone, patient stated that her duloxetine is also to expensive for her. I looked back on the $4 list and some medications listed were paroxetine, nortriptyline, fluoxetine, citalopram, amitriptyline, and buspirone.

## 2016-03-02 NOTE — Telephone Encounter (Signed)
Patient returned call. I let her know that Kayla MaxwellCheryl said that the other options for replacing Duloxetine were not good ones. I told the patient that Kayla MaxwellCheryl wanted her to come in and be seen if she absolutely could not afford the medication. Patient scheduled an appointment for 03/04/16.

## 2016-03-02 NOTE — Telephone Encounter (Signed)
Those are not good options for Duloxetine.  If she absolutely can't afford it, we will need an appt to discuss

## 2016-03-02 NOTE — Telephone Encounter (Signed)
Called and left patient a VM asking for her to please return my call.  

## 2016-03-04 ENCOUNTER — Ambulatory Visit (INDEPENDENT_AMBULATORY_CARE_PROVIDER_SITE_OTHER): Payer: BLUE CROSS/BLUE SHIELD | Admitting: Unknown Physician Specialty

## 2016-03-04 ENCOUNTER — Encounter: Payer: Self-pay | Admitting: Unknown Physician Specialty

## 2016-03-04 DIAGNOSIS — F322 Major depressive disorder, single episode, severe without psychotic features: Secondary | ICD-10-CM | POA: Diagnosis not present

## 2016-03-04 MED ORDER — FLUOXETINE HCL 20 MG PO TABS
20.0000 mg | ORAL_TABLET | Freq: Every day | ORAL | 3 refills | Status: DC
Start: 2016-03-04 — End: 2016-07-08

## 2016-03-04 NOTE — Progress Notes (Signed)
BP 115/74 (BP Location: Left Arm, Patient Position: Sitting, Cuff Size: Large)   Pulse 84   Temp 98.8 F (37.1 C)   Wt 205 lb 9.6 oz (93.3 kg)   SpO2 95%   BMI 33.49 kg/m    Subjective:    Patient ID: Kayla Wright, female    DOB: 02/21/1955, 61 y.o.   MRN: [id number]  HPI: Kayla Wright is a 61 y.o. female  Chief Complaint  Patient presents with  . Medication Problem    pt states she is here to talk with Elnita Maxwellheryl about changing to Duloxetine, states it is too expensive for her    Pt states her depression is improved  She has tried a different number of medications in the past.  Her depression is better than last visit. She is waiting on a call for group therapy  [facility] 03/04/2016 01/27/2016 04/13/2015  Decreased Interest 1 3 3   Down, Depressed, Hopeless 1 3 3   PHQ - 2 Score 2 6 6   Altered sleeping 2 3 3   Tired, decreased energy 2 3 3   Change in appetite 1 2 2   Feeling bad or failure about yourself  0 2 3  Trouble concentrating 1 2 2   Moving slowly or fidgety/restless 1 2 3   Suicidal thoughts 0 2 3  PHQ-9 Score 9 22 25      Relevant past medical, surgical, family and social history reviewed and updated as indicated. Interim medical history since our last visit reviewed. Allergies and medications reviewed and updated.  Review of Systems  Per HPI unless specifically indicated above     Objective:    BP 115/74 (BP Location: Left Arm, Patient Position: Sitting, Cuff Size: Large)   Pulse 84   Temp 98.8 F (37.1 C)   Wt 205 lb 9.6 oz (93.3 kg)   SpO2 95%   BMI 33.49 kg/m   Wt Readings from Last 3 Encounters:  03/04/16 205 lb 9.6 oz (93.3 kg)  01/27/16 204 lb 3.2 oz (92.6 kg)  11/02/15 195 lb 9.6 oz (88.7 kg)    Physical Exam  Constitutional: She is oriented to person, place, and time. She appears well-developed and well-nourished. No distress.  HENT:  Head: Normocephalic and atraumatic.  Eyes: Conjunctivae and lids are normal. Right eye exhibits no  discharge. Left eye exhibits no discharge. No scleral icterus.  Cardiovascular: Normal rate.   Pulmonary/Chest: Effort normal.  Abdominal: Normal appearance. There is no splenomegaly or hepatomegaly.  Musculoskeletal: Normal range of motion.  Neurological: She is alert and oriented to person, place, and time.  Skin: Skin is intact. No rash noted. No pallor.  Psychiatric: She has a normal mood and affect. Her behavior is normal. Judgment and thought content normal.    Results for orders placed or performed in visit on 01/27/16  Comprehensive metabolic panel  Result Value Ref Range   Glucose 87 65 - 99 mg/dL   BUN 9 6 - 24 mg/dL   Creatinine, Ser 4.091.14 (H) 0.57 - 1.00 mg/dL   GFR calc non Af Amer 53 (L) >59 mL/min/1.73   GFR calc Af Amer 61 >59 mL/min/1.73   BUN/Creatinine Ratio 8 (L) 9 - 23   Sodium 144 134 - 144 mmol/L   Potassium 3.6 3.5 - 5.2 mmol/L   Chloride 102 96 - 106 mmol/L   CO2 24 18 - 29 mmol/L   Calcium 8.7 8.7 - 10.2 mg/dL   Total Protein 6.6 6.0 - 8.5 g/dL  Albumin 4.6 3.5 - 5.5 g/dL   Globulin, Total 2.0 1.5 - 4.5 g/dL   Albumin/Globulin Ratio 2.3 (H) 1.2 - 2.2   Bilirubin Total 0.7 0.0 - 1.2 mg/dL   Alkaline Phosphatase 117 39 - 117 IU/L   AST 18 0 - 40 IU/L   ALT 16 0 - 32 IU/L  Lipid Panel w/o Chol/HDL Ratio  Result Value Ref Range   Cholesterol, Total 188 100 - 199 mg/dL   Triglycerides 161 (H) 0 - 149 mg/dL   HDL 43 >09 mg/dL   VLDL Cholesterol Cal 37 5 - 40 mg/dL   LDL Calculated 604 (H) 0 - 99 mg/dL  TSH  Result Value Ref Range   TSH 2.090 0.450 - 4.500 uIU/mL  VITAMIN D 25 Hydroxy (Vit-D Deficiency, Fractures)  Result Value Ref Range   Vit D, 25-Hydroxy 39.5 30.0 - 100.0 ng/mL      Assessment & Plan:   Problem List Items Addressed This Visit      Unprioritized   Severe depression (HCC)    Change from Cymbalta to Fluoxetine 20 mg.  Discussed risks of changing her medication      Relevant Medications   FLUoxetine (PROZAC) 20 MG tablet         Follow up plan: Return in about 6 weeks (around 04/15/2016).

## 2016-03-04 NOTE — Assessment & Plan Note (Signed)
Change from Cymbalta to Fluoxetine 20 mg.  Discussed risks of changing her medication

## 2016-03-14 ENCOUNTER — Other Ambulatory Visit: Payer: Self-pay | Admitting: Unknown Physician Specialty

## 2016-03-23 ENCOUNTER — Other Ambulatory Visit: Payer: Self-pay | Admitting: Unknown Physician Specialty

## 2016-04-19 ENCOUNTER — Ambulatory Visit: Payer: Self-pay | Admitting: Unknown Physician Specialty

## 2016-04-25 ENCOUNTER — Other Ambulatory Visit: Payer: Self-pay | Admitting: Unknown Physician Specialty

## 2016-05-26 ENCOUNTER — Other Ambulatory Visit: Payer: Self-pay | Admitting: Unknown Physician Specialty

## 2016-06-01 ENCOUNTER — Encounter: Payer: Self-pay | Admitting: Unknown Physician Specialty

## 2016-06-01 ENCOUNTER — Ambulatory Visit (INDEPENDENT_AMBULATORY_CARE_PROVIDER_SITE_OTHER): Payer: BLUE CROSS/BLUE SHIELD | Admitting: Unknown Physician Specialty

## 2016-06-01 ENCOUNTER — Emergency Department
Admission: EM | Admit: 2016-06-01 | Discharge: 2016-06-01 | Disposition: A | Discharge status: Home / Self Care | Payer: Worker's Compensation | Attending: Emergency Medicine | Admitting: Emergency Medicine

## 2016-06-01 ENCOUNTER — Emergency Department: Payer: Worker's Compensation

## 2016-06-01 ENCOUNTER — Encounter: Payer: Self-pay | Admitting: Emergency Medicine

## 2016-06-01 VITALS — BP 107/66 | HR 69 | Temp 98.0°F | Wt 200.4 lb

## 2016-06-01 DIAGNOSIS — E039 Hypothyroidism, unspecified: Secondary | ICD-10-CM | POA: Diagnosis not present

## 2016-06-01 DIAGNOSIS — I10 Essential (primary) hypertension: Secondary | ICD-10-CM | POA: Insufficient documentation

## 2016-06-01 DIAGNOSIS — M545 Low back pain, unspecified: Secondary | ICD-10-CM

## 2016-06-01 DIAGNOSIS — Y929 Unspecified place or not applicable: Secondary | ICD-10-CM | POA: Insufficient documentation

## 2016-06-01 DIAGNOSIS — W010XXA Fall on same level from slipping, tripping and stumbling without subsequent striking against object, initial encounter: Secondary | ICD-10-CM | POA: Insufficient documentation

## 2016-06-01 DIAGNOSIS — R05 Cough: Secondary | ICD-10-CM

## 2016-06-01 DIAGNOSIS — Y990 Civilian activity done for income or pay: Secondary | ICD-10-CM | POA: Insufficient documentation

## 2016-06-01 DIAGNOSIS — Z79899 Other long term (current) drug therapy: Secondary | ICD-10-CM | POA: Diagnosis not present

## 2016-06-01 DIAGNOSIS — S3992XA Unspecified injury of lower back, initial encounter: Secondary | ICD-10-CM | POA: Diagnosis present

## 2016-06-01 DIAGNOSIS — R059 Cough, unspecified: Secondary | ICD-10-CM

## 2016-06-01 DIAGNOSIS — Y9389 Activity, other specified: Secondary | ICD-10-CM | POA: Diagnosis not present

## 2016-06-01 DIAGNOSIS — G4733 Obstructive sleep apnea (adult) (pediatric): Secondary | ICD-10-CM

## 2016-06-01 MED ORDER — PREDNISONE 20 MG PO TABS: 40.0000 mg | ORAL_TABLET | Freq: Every day | ORAL | 0 refills | Status: DC

## 2016-06-01 MED ORDER — METHOCARBAMOL 750 MG PO TABS: 1500.0000 mg | ORAL_TABLET | Freq: Four times a day (QID) | ORAL | 0 refills | Status: DC

## 2016-06-01 MED ORDER — KETOROLAC TROMETHAMINE 10 MG PO TABS: 10.0000 mg | ORAL_TABLET | Freq: Four times a day (QID) | ORAL | 0 refills | Status: DC | PRN

## 2016-06-01 MED ORDER — HYDROMORPHONE HCL 1 MG/ML IJ SOLN
1.0000 mg | Freq: Once | INTRAMUSCULAR | Status: AC
  Administered 2016-06-01: 1 mg via INTRAMUSCULAR
  Filled 2016-06-01: qty 1

## 2016-06-01 MED ORDER — KETOROLAC TROMETHAMINE 60 MG/2ML IM SOLN
30.0000 mg | Freq: Once | INTRAMUSCULAR | Status: AC
  Administered 2016-06-01: 30 mg via INTRAMUSCULAR
  Filled 2016-06-01: qty 2

## 2016-06-01 MED ORDER — ALBUTEROL SULFATE HFA 108 (90 BASE) MCG/ACT IN AERS: 2.0000 | INHALATION_SPRAY | Freq: Four times a day (QID) | RESPIRATORY_TRACT | 2 refills | Status: DC | PRN

## 2016-06-01 NOTE — ED Notes (Signed)
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

## 2016-06-01 NOTE — ED Triage Notes (Signed)
Slipped on a tray at work fell on bottom.  Back painm

## 2016-06-01 NOTE — Assessment & Plan Note (Addendum)
On CPAP and O2 at night.  Pt would like a travel tank when going on trips.  She will ask the respiratory company what needs to be ordered for this

## 2016-06-01 NOTE — ED Provider Notes (Signed)
[facility] Emergency Department Provider Note   ____________________________________________   First MD Initiated Contact with Patient 06/01/16 1200     (approximate)  I have reviewed the triage vital signs and the nursing notes.   HISTORY  Chief Complaint Fall    HPI Kayla Wright is a 61 y.o. female patient complaining of back pain secondary to a slip and fall at work. Patient states she stepped on a tree at work and landed on her buttocks. Patient increasing pain to the low back and buttocks area. Patient denies any bladder or bowel dysfunction. Patient denies any radicular component to her back pain. Patient has a history of chronic upper and low back pain. Patient rates the pain as a 10 over 10. Patient states she takes Septra for her chronic upper and low back pain. Past Medical History:  Diagnosis Date  . Allergy   . Common migraine   . Depression   . GERD (gastroesophageal reflux disease)   . Hyperlipidemia   . Hypertension   . Insomnia   . Menopausal symptom   . Osteopenia   . Thyroid disease     Patient Active Problem List   Diagnosis Date Noted  . Leg pain 01/27/2016  . Insomnia 11/02/2015  . Low back pain 06/02/2015  . Thoracic back pain 02/11/2015  . Severe depression (HCC) 10/21/2014  . Chronic anxiety 10/21/2014  . Hyperlipidemia 10/21/2014  . Hypothyroidism 10/21/2014  . Sleep apnea 07/02/2014    Past Surgical History:  Procedure Laterality Date  . ANKLE FRACTURE SURGERY  06/18/15  . TUBAL LIGATION      Prior to Admission medications   Medication Sig Start Date End Date Taking? Authorizing Provider  ARIPiprazole (ABILIFY) 10 MG tablet Take 1 tablet (10 mg total) by mouth daily. 06/03/15   Brandy Hale, MD  Cholecalciferol (VITAMIN D3) 5000 units CAPS Take 5,000 Units by mouth daily.    [provider]  clonazePAM (KLONOPIN) 1 MG tablet Take 1 mg by mouth daily as needed.    [provider]    Cyanocobalamin (VITAMIN B-12 PO) Take 1 tablet by mouth daily.    [provider]  diclofenac sodium (VOLTAREN) 1 % GEL Apply topically daily. 02/06/16   [provider]  FLUoxetine (PROZAC) 20 MG tablet Take 1 tablet (20 mg total) by mouth daily. 03/04/16   Gabriel Cirri, NP  ketorolac (TORADOL) 10 MG tablet Take 1 tablet (10 mg total) by mouth every 6 (six) hours as needed. 06/01/16   Joni Reining, PA-C  levothyroxine (SYNTHROID, LEVOTHROID) 50 MCG tablet Take 1 tablet (50 mcg total) by mouth daily before breakfast. 02/15/16   Gabriel Cirri, NP  lovastatin (MEVACOR) 40 MG tablet Take 1 tablet (40 mg total) by mouth at bedtime. 02/24/16   Gabriel Cirri, NP  methocarbamol (ROBAXIN-750) 750 MG tablet Take 2 tablets (1,500 mg total) by mouth 4 (four) times daily. 06/01/16   Joni Reining, PA-C  omeprazole (PRILOSEC) 20 MG capsule Take 20 mg by mouth daily.    [provider]  oxyCODONE-acetaminophen (PERCOCET/ROXICET) 5-325 MG tablet Take 1 tablet by mouth every 6 (six) hours as needed for severe pain.    [provider]  simvastatin (ZOCOR) 40 MG tablet Take 40 mg by mouth daily. 02/20/16   [provider]  simvastatin (ZOCOR) 40 MG tablet TAKE 1 TABLET BY MOUTH ONCE DAILY 05/27/16   Gabriel Cirri, NP  zolpidem (AMBIEN) 10 MG tablet TAKE ONE TABLET BY MOUTH  AT BEDTIME AS NEEDED FOR SLEEP 03/15/16   Gabriel CirriWicker, [name], NP    Allergies Patient has no known allergies.  Family History  Problem Relation Age of Onset  . Diabetes Mother   . Heart disease Mother   . Cirrhosis Mother   . Heart disease Father     Social History Social History  Substance Use Topics  . Smoking status: Never Smoker  . Smokeless tobacco: Never Used  . Alcohol use No    Review of Systems  Constitutional: No fever/chills Eyes: No visual changes. ENT: No sore throat. Cardiovascular: Denies chest pain. Respiratory: Denies shortness of breath. Gastrointestinal: No abdominal  pain.  No nausea, no vomiting.  No diarrhea.  No constipation. Genitourinary: Negative for dysuria. Musculoskeletal: Positive for back pain. Skin: Negative for rash. Neurological: Negative for headaches, focal weakness or numbness. Endocrine:Hypothyroidism and hyperlipidemia. ____________________________________________   PHYSICAL EXAM:  VITAL SIGNS: ED Triage Vitals   Enc Vitals Group     BP 108/64     Pulse Rate 71     Resp 14     Temp 98 F (36.7 C)     Temp Source Oral     SpO2 96 %     Weight 186 lb (84.4 kg)     Height 5\' 8"  (1.727 m)     Head Circumference      Peak Flow      Pain Score 10     Pain Loc      Pain Edu?      Excl. in GC?     Constitutional: Alert and oriented. Well appearing and in no acute distress. Eyes: Conjunctivae are normal. PERRL. EOMI. Head: Atraumatic. Nose: No congestion/rhinnorhea. Mouth/Throat: Mucous membranes are moist.  Oropharynx non-erythematous. Neck: No stridor.  No cervical spine tenderness to palpation. Hematological/Lymphatic/Immunilogical: No cervical lymphadenopathy. Cardiovascular: Normal rate, regular rhythm. Grossly normal heart sounds.  Good peripheral circulation. Respiratory: Normal respiratory effort.  No retractions. Lungs CTAB. Gastrointestinal: Soft and nontender. No distention. No abdominal bruits. No CVA tenderness. Musculoskeletal: No lower extremity tenderness nor edema.  No joint effusions. Neurologic:  Normal speech and language. No gross focal neurologic deficits are appreciated. No gait instability. Skin:  Skin is warm, dry and intact. No rash noted. Psychiatric: Mood and affect are normal. Speech and behavior are normal.  ____________________________________________   LABS (all labs ordered are listed, but only abnormal results are displayed)  Labs Reviewed - No data to display ____________________________________________  EKG   ____________________________________________  RADIOLOGY  No  acute findings on x-ray of the lumbar spine. ____________________________________________   PROCEDURES  Procedure(s) performed: None  Procedures  Critical Care performed: No  ____________________________________________   INITIAL IMPRESSION / ASSESSMENT AND PLAN / ED COURSE  Pertinent labs & imaging results that were available during my care of the patient were reviewed by me and considered in my medical decision making (see chart for details).  Back pain secondary to fall. Discuss negative x-ray finding with patient. Patient given discharge care instructions. Patient advised to continue chronic pain medications. Patient given a prescription for Robaxin and Toradol. Patient given a work note for 2 days. Patient advised follow-up with Worker's Compensation doctor if condition persists.      ____________________________________________   FINAL CLINICAL IMPRESSION(S) / ED DIAGNOSES  Final diagnoses:  Back pain at L4-L5 level      NEW MEDICATIONS STARTED DURING THIS VISIT:  New Prescriptions   KETOROLAC (TORADOL) 10 MG TABLET    Take 1 tablet (10 mg total)  by mouth every 6 (six) hours as needed.   METHOCARBAMOL (ROBAXIN-750) 750 MG TABLET    Take 2 tablets (1,500 mg total) by mouth 4 (four) times daily.     Note:  This document was prepared using Dragon voice recognition software and may include unintentional dictation errors.    Joni Reining, PA-C 06/01/16 1259    Emily Filbert, MD 06/01/16 [phone]

## 2016-06-01 NOTE — Discharge Instructions (Signed)
Continue previous pain medications. Start Toradol and Robaxin as directed.

## 2016-06-01 NOTE — Progress Notes (Signed)
BP 107/66   Pulse 69   Temp 98 F (36.7 C)   Wt 200 lb 6.4 oz (90.9 kg)   SpO2 93%   BMI 30.47 kg/m    Subjective:    Patient ID: Kayla Wright, female    DOB: Jun 01, 1955, 61 y.o.   MRN: [id number]  HPI: Kayla Wright is a 61 y.o. female  Chief Complaint  Patient presents with  . Cough    pt states that she has had a dry cough for 2 weeks   . Fall    pt also states she had a fall this morning at work and had to go to the ER    Ranell Patrick to the ER for a fall this AM.  Already evaluated  She presents today with a cough 2 week history of cough Cough  This is a new problem. Episode onset: 2 weeks. The problem has been unchanged. The problem occurs constantly (more in the AM and night.  Less during the day). Pertinent negatives include no chest pain, chills, fever, nasal congestion, rhinorrhea, sore throat, shortness of breath or sweats. Nothing aggravates the symptoms. Treatments tried: mucinex. The treatment provided no relief. There is no history of asthma, COPD or emphysema.  Fall  Pertinent negatives include no fever.    Relevant past medical, surgical, family and social history reviewed and updated as indicated. Interim medical history since our last visit reviewed. Allergies and medications reviewed and updated.  Review of Systems  Constitutional: Negative for chills and fever.  HENT: Negative for rhinorrhea and sore throat.   Respiratory: Positive for cough. Negative for shortness of breath.   Cardiovascular: Negative for chest pain.    Per HPI unless specifically indicated above     Objective:    BP 107/66   Pulse 69   Temp 98 F (36.7 C)   Wt 200 lb 6.4 oz (90.9 kg)   SpO2 93%   BMI 30.47 kg/m   Wt Readings from Last 3 Encounters:  06/01/16 200 lb 6.4 oz (90.9 kg)  06/01/16 186 lb (84.4 kg)  03/04/16 205 lb 9.6 oz (93.3 kg)    Physical Exam  Constitutional: She is oriented to person, place, and time. She appears well-developed and well-nourished. No  distress.  HENT:  Head: Normocephalic and atraumatic.  Eyes: Conjunctivae and lids are normal. Right eye exhibits no discharge. Left eye exhibits no discharge. No scleral icterus.  Neck: Normal range of motion. Neck supple. No JVD present. Carotid bruit is not present.  Cardiovascular: Normal rate, regular rhythm and normal heart sounds.   Pulmonary/Chest: Effort normal and breath sounds normal.  Abdominal: Normal appearance. There is no splenomegaly or hepatomegaly.  Musculoskeletal: Normal range of motion.  Neurological: She is alert and oriented to person, place, and time.  Skin: Skin is warm, dry and intact. No rash noted. No pallor.  Psychiatric: She has a normal mood and affect. Her behavior is normal. Judgment and thought content normal.    Results for orders placed or performed in visit on 01/27/16  Comprehensive metabolic panel  Result Value Ref Range   Glucose 87 65 - 99 mg/dL   BUN 9 6 - 24 mg/dL   Creatinine, Ser 4.09 (H) 0.57 - 1.00 mg/dL   GFR calc non Af Amer 53 (L) >59 mL/min/1.73   GFR calc Af Amer 61 >59 mL/min/1.73   BUN/Creatinine Ratio 8 (L) 9 - 23   Sodium 144 134 - 144 mmol/L   Potassium 3.6  3.5 - 5.2 mmol/L   Chloride 102 96 - 106 mmol/L   CO2 24 18 - 29 mmol/L   Calcium 8.7 8.7 - 10.2 mg/dL   Total Protein 6.6 6.0 - 8.5 g/dL   Albumin 4.6 3.5 - 5.5 g/dL   Globulin, Total 2.0 1.5 - 4.5 g/dL   Albumin/Globulin Ratio 2.3 (H) 1.2 - 2.2   Bilirubin Total 0.7 0.0 - 1.2 mg/dL   Alkaline Phosphatase 117 39 - 117 IU/L   AST 18 0 - 40 IU/L   ALT 16 0 - 32 IU/L  Lipid Panel w/o Chol/HDL Ratio  Result Value Ref Range   Cholesterol, Total 188 100 - 199 mg/dL   Triglycerides 478183 (H) 0 - 149 mg/dL   HDL 43 >29>39 mg/dL   VLDL Cholesterol Cal 37 5 - 40 mg/dL   LDL Calculated 562108 (H) 0 - 99 mg/dL  TSH  Result Value Ref Range   TSH 2.090 0.450 - 4.500 uIU/mL  VITAMIN D 25 Hydroxy (Vit-D Deficiency, Fractures)  Result Value Ref Range   Vit D, 25-Hydroxy 39.5 30.0 -  100.0 ng/mL      Assessment & Plan:   Problem List Items Addressed This Visit      Unprioritized   Sleep apnea    On CPAP and O2 at night.  Pt would like a travel tank when going on trips.  She will ask the respiratory company what needs to be ordered for this       Other Visit Diagnoses    Cough    -  Primary   Probably related to active airway. Get a chest x-ray.  Will order Prednisone burst and inhaler   Relevant Orders   DG Chest 2 View       Follow up plan: Return if symptoms worsen or fail to improve, for Schedule for PE.

## 2016-06-02 ENCOUNTER — Ambulatory Visit
Admission: RE | Admit: 2016-06-02 | Discharge: 2016-06-02 | Disposition: A | Discharge status: Home / Self Care | Payer: BLUE CROSS/BLUE SHIELD | Source: Ambulatory Visit | Attending: Unknown Physician Specialty | Admitting: Unknown Physician Specialty

## 2016-06-02 DIAGNOSIS — R05 Cough: Secondary | ICD-10-CM | POA: Diagnosis present

## 2016-06-02 DIAGNOSIS — R059 Cough, unspecified: Secondary | ICD-10-CM

## 2016-06-03 NOTE — Progress Notes (Signed)
Patient notified

## 2016-06-06 ENCOUNTER — Ambulatory Visit (INDEPENDENT_AMBULATORY_CARE_PROVIDER_SITE_OTHER): Payer: BLUE CROSS/BLUE SHIELD | Admitting: Unknown Physician Specialty

## 2016-06-06 ENCOUNTER — Encounter: Payer: Self-pay | Admitting: Unknown Physician Specialty

## 2016-06-06 VITALS — BP 118/70 | HR 63 | Temp 98.2°F | Wt 203.0 lb

## 2016-06-06 DIAGNOSIS — R05 Cough: Secondary | ICD-10-CM

## 2016-06-06 DIAGNOSIS — R059 Cough, unspecified: Secondary | ICD-10-CM

## 2016-06-06 DIAGNOSIS — J4521 Mild intermittent asthma with (acute) exacerbation: Secondary | ICD-10-CM

## 2016-06-06 MED ORDER — ALBUTEROL SULFATE (2.5 MG/3ML) 0.083% IN NEBU
2.5000 mg | INHALATION_SOLUTION | Freq: Once | RESPIRATORY_TRACT | Status: AC
  Administered 2016-06-06: 2.5 mg via RESPIRATORY_TRACT

## 2016-06-06 MED ORDER — BUDESONIDE-FORMOTEROL FUMARATE 160-4.5 MCG/ACT IN AERO: 2.0000 | INHALATION_SPRAY | Freq: Two times a day (BID) | RESPIRATORY_TRACT | 3 refills | Status: DC

## 2016-06-06 NOTE — Progress Notes (Signed)
BP 118/70   Pulse 63   Temp 98.2 F (36.8 C)   Wt 203 lb (92.1 kg)   SpO2 97%   BMI 30.87 kg/m    Subjective:    Patient ID: Kayla Wright, female    DOB: [birth year], 61 y.o.   MRN: [id number]  HPI: ANOKHI SHANNON is a 61 y.o. female  Chief Complaint  Patient presents with  . Cough    pt states she finsihed the prednisone but still has a bad cough    Pt continue with the bad cough - see last note.  States it is getting worse.  No help with Prednisone or inhaler.  Of note, no new medications. Reviewed chest x-ray from Friday which was normal.  She states the pharmacist did not show her how to use her inhaler  Relevant past medical, surgical, family and social history reviewed and updated as indicated. Interim medical history since our last visit reviewed. Allergies and medications reviewed and updated.  Review of Systems  Per HPI unless specifically indicated above     Objective:    BP 118/70   Pulse 63   Temp 98.2 F (36.8 C)   Wt 203 lb (92.1 kg)   SpO2 97%   BMI 30.87 kg/m   Wt Readings from Last 3 Encounters:  06/06/16 203 lb (92.1 kg)  06/01/16 200 lb 6.4 oz (90.9 kg)  06/01/16 186 lb (84.4 kg)    Physical Exam  Constitutional: She is oriented to person, place, and time. She appears well-developed and well-nourished. No distress.  HENT:  Head: Normocephalic and atraumatic.  Eyes: Conjunctivae and lids are normal. Right eye exhibits no discharge. Left eye exhibits no discharge. No scleral icterus.  Neck: Normal range of motion. Neck supple. No JVD present. Carotid bruit is not present.  Cardiovascular: Normal rate and regular rhythm.   Murmur heard.  Systolic murmur is present with a grade of 2/6  Pulmonary/Chest: Effort normal. She has decreased breath sounds.  Improved to normal after nebulizer  Abdominal: Normal appearance. There is no splenomegaly or hepatomegaly.  Musculoskeletal: Normal range of motion.  Neurological: She is alert and oriented to  person, place, and time.  Skin: Skin is warm, dry and intact. No rash noted. No pallor.  Psychiatric: She has a normal mood and affect. Her behavior is normal. Judgment and thought content normal.    Results for orders placed or performed in visit on 01/27/16  Comprehensive metabolic panel  Result Value Ref Range   Glucose 87 65 - 99 mg/dL   BUN 9 6 - 24 mg/dL   Creatinine, Ser 4.09 (H) 0.57 - 1.00 mg/dL   GFR calc non Af Amer 53 (L) >59 mL/min/1.73   GFR calc Af Amer 61 >59 mL/min/1.73   BUN/Creatinine Ratio 8 (L) 9 - 23   Sodium 144 134 - 144 mmol/L   Potassium 3.6 3.5 - 5.2 mmol/L   Chloride 102 96 - 106 mmol/L   CO2 24 18 - 29 mmol/L   Calcium 8.7 8.7 - 10.2 mg/dL   Total Protein 6.6 6.0 - 8.5 g/dL   Albumin 4.6 3.5 - 5.5 g/dL   Globulin, Total 2.0 1.5 - 4.5 g/dL   Albumin/Globulin Ratio 2.3 (H) 1.2 - 2.2   Bilirubin Total 0.7 0.0 - 1.2 mg/dL   Alkaline Phosphatase 117 39 - 117 IU/L   AST 18 0 - 40 IU/L   ALT 16 0 - 32 IU/L  Lipid Panel w/o Chol/HDL Ratio  Result Value Ref Range   Cholesterol, Total 188 100 - 199 mg/dL   Triglycerides 161183 (H) 0 - 149 mg/dL   HDL 43 >09>39 mg/dL   VLDL Cholesterol Cal 37 5 - 40 mg/dL   LDL Calculated 604108 (H) 0 - 99 mg/dL  TSH  Result Value Ref Range   TSH 2.090 0.450 - 4.500 uIU/mL  VITAMIN D 25 Hydroxy (Vit-D Deficiency, Fractures)  Result Value Ref Range   Vit D, 25-Hydroxy 39.5 30.0 - 100.0 ng/mL      Assessment & Plan:   Problem List Items Addressed This Visit    None    Visit Diagnoses    Cough    -  Primary   Suspect due to a reactive airway. Much improved following a nebulizer.  Inhaler instruction given ans sample of symbicort to use until complete.     Relevant Medications   albuterol (PROVENTIL) (2.5 MG/3ML) 0.083% nebulizer solution 2.5 mg (Completed)   Mild intermittent reactive airway disease with acute exacerbation       Relevant Medications   albuterol (PROVENTIL) (2.5 MG/3ML) 0.083% nebulizer solution 2.5 mg  (Completed)   budesonide-formoterol (SYMBICORT) 160-4.5 MCG/ACT inhaler       Follow up plan: Return if symptoms worsen or fail to improve.

## 2016-06-06 NOTE — Patient Instructions (Addendum)
Take Symbicort 2 puffs twice a day  How to Use a Nebulizer, Adult A nebulizer is a device that turns liquid medicine into a mist (vapor) that you can breathe in (inhale). You may need to use a nebulizer if you have a breathing illness, such as asthma or pneumonia. There are different kinds of nebulizers. With some, you breathe in through a mouthpiece. With others, a mask fits over your nose and mouth. Risks and complications Using a nebulizer that does not fit right or is not cleaned right can lead to the following complications:  Infection.  Eye irritation.  Delivery of too much medicine or not enough medicine.  Mouth irritation. How to prepare before using a nebulizer Take these steps before using your nebulizer: 1. Check your medicine. Make sure it has not expired and is not damaged in any way. 2. Wash your hands with soap and water. 3. Put all of the parts of your nebulizer on a sturdy, flat surface. Make sure all of the tubing is connected. 4. Measure the liquid medicine according to instructions from your health care provider. Pour the liquid into the part of the nebulizer that holds the medicine (reservoir). 5. Attach the mouthpiece or mask. 6. Test the nebulizer by turning it on to make sure that a spray comes out. Then, turn it off. How to use a nebulizer 1. Sit down and relax. 2. If your nebulizer has a mask, put it over your nose and mouth. It should fit somewhat snugly, with no gaps around the nose or cheeks where medicine could escape. If you use a mouthpiece, put it in your mouth. Press your lips firmly around the mouthpiece. 3. Turn on the nebulizer. 4. Breathe out (exhale). 5. Some nebulizers have a finger valve. If yours does, cover up the air hole so the air gets to the nebulizer. 6. Once the medicine begins to mist out, take slow, deep breaths. If there is a finger valve, release it at the end of your breath. 7. Continue taking slow, deep breaths until the medicine in  the nebulizer is gone and no vapor appears. Be sure to stop the machine at any time if you start coughing or if the medicine foams or bubbles. How to clean a nebulizer The nebulizer and all of its parts must be kept very clean. If the nebulizer and its parts are not cleaned properly, bacteria can grow inside of them. If you inhale the bacteria, you can get sick. Follow the manufacturer's instructions for cleaning your nebulizer. For most nebulizers, you should follow these guidelines:  Wash the nebulizer after each use. Use warm water and soap. Make sure to wash the mouthpiece or mask and the medicine area, but do not wash the tubing or mouthpiece.  After you wash the nebulizer, place its parts on a clean towel and let them dry completely. After they dry, reconnect the pieces and turn the nebulizer on without any medicine in it. Doing this will blow air through the equipment to help dry it out.  Store the nebulizer in a clean and dust-free place.  Check the filter at least one time every week. Replace it if it looks dirty. Contact a health care provider if:  You continue to have trouble breathing.  You have trouble using the nebulizer.  Your breathing gets worse during a nebulizer treatment.  Your nebulizer stops working, foams, or does not create a mist after you add medicine and turn it on. This information is not intended  to replace advice given to you by your health care provider. Make sure you discuss any questions you have with your health care provider. Document Released: 12/29/2008 Document Revised: 09/10/2015 Document Reviewed: 07/18/2015 Elsevier Interactive Patient Education  2017 [employer]  Asthma, Adult Asthma is a recurring condition in which the airways tighten and narrow. Asthma can make it difficult to breathe. It can cause coughing, wheezing, and shortness of breath. Asthma episodes, also called asthma attacks, range from minor to life-threatening. Asthma cannot be  cured, but medicines and lifestyle changes can help control it. What are the causes? Asthma is believed to be caused by inherited (genetic) and environmental factors, but its exact cause is unknown. Asthma may be triggered by allergens, lung infections, or irritants in the air. Asthma triggers are different for each person. Common triggers include:  Animal dander.  Dust mites.  Cockroaches.  Pollen from trees or grass.  Mold.  Smoke.  Air pollutants such as dust, household cleaners, hair sprays, aerosol sprays, paint fumes, strong chemicals, or strong odors.  Cold air, weather changes, and winds (which increase molds and pollens in the air).  Strong emotional expressions such as crying or laughing hard.  Stress.  Certain medicines (such as aspirin) or types of drugs (such as beta-blockers).  Sulfites in foods and drinks. Foods and drinks that may contain sulfites include dried fruit, potato chips, and sparkling grape juice.  Infections or inflammatory conditions such as the flu, a cold, or an inflammation of the nasal membranes (rhinitis).  Gastroesophageal reflux disease (GERD).  Exercise or strenuous activity. What are the signs or symptoms? Symptoms may occur immediately after asthma is triggered or many hours later. Symptoms include:  Wheezing.  Excessive nighttime or early morning coughing.  Frequent or severe coughing with a common cold.  Chest tightness.  Shortness of breath. How is this diagnosed? The diagnosis of asthma is made by a review of your medical history and a physical exam. Tests may also be performed. These may include:  Lung function studies. These tests show how much air you breathe in and out.  Allergy tests.  Imaging tests such as X-rays. How is this treated? Asthma cannot be cured, but it can usually be controlled. Treatment involves identifying and avoiding your asthma triggers. It also involves medicines. There are 2 classes of medicine  used for asthma treatment:  Controller medicines. These prevent asthma symptoms from occurring. They are usually taken every day.  Reliever or rescue medicines. These quickly relieve asthma symptoms. They are used as needed and provide short-term relief. Your health care provider will help you create an asthma action plan. An asthma action plan is a written plan for managing and treating your asthma attacks. It includes a list of your asthma triggers and how they may be avoided. It also includes information on when medicines should be taken and when their dosage should be changed. An action plan may also involve the use of a device called a peak flow meter. A peak flow meter measures how well the lungs are working. It helps you monitor your condition. Follow these instructions at home:  Take medicines only as directed by your health care provider. Speak with your health care provider if you have questions about how or when to take the medicines.  Use a peak flow meter as directed by your health care provider. Record and keep track of readings.  Understand and use the action plan to help minimize or stop an asthma attack without needing  to seek medical care.  Control your home environment in the following ways to help prevent asthma attacks:  Do not smoke. Avoid being exposed to secondhand smoke.  Change your heating and air conditioning filter regularly.  Limit your use of fireplaces and wood stoves.  Get rid of pests (such as roaches and mice) and their droppings.  Throw away plants if you see mold on them.  Clean your floors and dust regularly. Use unscented cleaning products.  Try to have someone else vacuum for you regularly. Stay out of rooms while they are being vacuumed and for a short while afterward. If you vacuum, use a dust mask from a hardware store, a double-layered or microfilter vacuum cleaner bag, or a vacuum cleaner with a HEPA filter.  Replace carpet with wood, tile,  or vinyl flooring. Carpet can trap dander and dust.  Use allergy-proof pillows, mattress covers, and box spring covers.  Wash bed sheets and blankets every week in hot water and dry them in a dryer.  Use blankets that are made of polyester or cotton.  Clean bathrooms and kitchens with bleach. If possible, have someone repaint the walls in these rooms with mold-resistant paint. Keep out of the rooms that are being cleaned and painted.  Wash hands frequently. Contact a health care provider if:  You have wheezing, shortness of breath, or a cough even if taking medicine to prevent attacks.  The colored mucus you cough up (sputum) is thicker than usual.  Your sputum changes from clear or white to yellow, green, gray, or bloody.  You have any problems that may be related to the medicines you are taking (such as a rash, itching, swelling, or trouble breathing).  You are using a reliever medicine more than 2-3 times per week.  Your peak flow is still at 50-79% of your personal best after following your action plan for 1 hour.  You have a fever. Get help right away if:  You seem to be getting worse and are unresponsive to treatment during an asthma attack.  You are short of breath even at rest.  You get short of breath when doing very little physical activity.  You have difficulty eating, drinking, or talking due to asthma symptoms.  You develop chest pain.  You develop a fast heartbeat.  You have a bluish color to your lips or fingernails.  You are light-headed, dizzy, or faint.  Your peak flow is less than 50% of your personal best. This information is not intended to replace advice given to you by your health care provider. Make sure you discuss any questions you have with your health care provider. Document Released: 01/10/2005 Document Revised: 06/24/2015 Document Reviewed: 08/09/2012 Elsevier Interactive Patient Education  2017 [employer].

## 2016-06-15 ENCOUNTER — Ambulatory Visit (INDEPENDENT_AMBULATORY_CARE_PROVIDER_SITE_OTHER): Payer: BLUE CROSS/BLUE SHIELD | Admitting: Family Medicine

## 2016-06-15 ENCOUNTER — Encounter: Payer: Self-pay | Admitting: Family Medicine

## 2016-06-15 DIAGNOSIS — J309 Allergic rhinitis, unspecified: Secondary | ICD-10-CM

## 2016-06-15 MED ORDER — CETIRIZINE HCL 10 MG PO TABS: 10.0000 mg | ORAL_TABLET | Freq: Every day | ORAL | 11 refills | Status: DC

## 2016-06-15 MED ORDER — HYDROCOD POLST-CPM POLST ER 10-8 MG/5ML PO SUER: 5.0000 mL | Freq: Two times a day (BID) | ORAL | 0 refills | Status: DC | PRN

## 2016-06-15 MED ORDER — FLUTICASONE PROPIONATE 50 MCG/ACT NA SUSP: 2.0000 | Freq: Every day | NASAL | 6 refills | Status: DC

## 2016-06-15 MED ORDER — BENZONATATE 100 MG PO CAPS: 200.0000 mg | ORAL_CAPSULE | Freq: Three times a day (TID) | ORAL | 0 refills | Status: DC | PRN

## 2016-06-15 NOTE — Progress Notes (Signed)
   BP 123/81   Pulse 80   Temp 99.3 F (37.4 C)   Wt 201 lb (91.2 kg)   SpO2 97%   BMI 30.56 kg/m    Subjective:    Patient ID: Kayla Wright Estabrooks, female    DOB: 08/05/1955, 61 y.o.   MRN: [id number]  HPI: Kayla Wright Dombkowski is a 61 y.o. female  Chief Complaint  Patient presents with  . URI    x 1 month, seen twice by Elnita Maxwellheryl. Got antibiotics once. Still having cough (mostly dry,occasionally productive), chest congestion, headache, No sore throat, no head congestion.   Patient presents for continued dry cough. Has tried multiple treatments the past month, including antibiotics, prednisone, and inhalers. CXR WNL. Does have a hx of seasonal allergies not currently on an allergy regimen. Denies fever, chills, aches, sweats, SOB, CP, palpitations.   Relevant past medical, surgical, family and social history reviewed and updated as indicated. Interim medical history since our last visit reviewed. Allergies and medications reviewed and updated.  Review of Systems  Constitutional: Negative.   HENT: Negative.   Eyes: Negative.   Respiratory: Negative for cough.   Cardiovascular: Negative.   Gastrointestinal: Negative.   Genitourinary: Negative.   Musculoskeletal: Negative.   Neurological: Negative.   Psychiatric/Behavioral: Negative.     Per HPI unless specifically indicated above     Objective:    BP 123/81   Pulse 80   Temp 99.3 F (37.4 C)   Wt 201 lb (91.2 kg)   SpO2 97%   BMI 30.56 kg/m   Wt Readings from Last 3 Encounters:  06/15/16 201 lb (91.2 kg)  06/06/16 203 lb (92.1 kg)  06/01/16 200 lb 6.4 oz (90.9 kg)    Physical Exam  Constitutional: She is oriented to person, place, and time. She appears well-developed and well-nourished. No distress.  HENT:  Head: Atraumatic.  Eyes: Conjunctivae are normal. Pupils are equal, round, and reactive to light. No scleral icterus.  Neck: Normal range of motion. Neck supple.  Cardiovascular: Normal rate, normal heart sounds and  intact distal pulses.   Pulmonary/Chest: Effort normal and breath sounds normal. No respiratory distress. She has no wheezes. She has no rales.  Musculoskeletal: Normal range of motion.  Neurological: She is alert and oriented to person, place, and time.  Skin: Skin is warm and dry.  Psychiatric: She has a normal mood and affect. Her behavior is normal.  Nursing note and vitals reviewed.     Assessment & Plan:   Problem List Items Addressed This Visit      Respiratory   Allergic rhinitis    Will try flonase and zyrtec daily in addition to inhaler regimen. Will also add tessalon perles and tussionex for cough suppression. Precautions discussed. F/u if worsening or no improvement          Follow up plan: Return if symptoms worsen or fail to improve.

## 2016-06-17 DIAGNOSIS — J309 Allergic rhinitis, unspecified: Secondary | ICD-10-CM | POA: Insufficient documentation

## 2016-06-17 NOTE — Assessment & Plan Note (Signed)
Will try flonase and zyrtec daily in addition to inhaler regimen. Will also add tessalon perles and tussionex for cough suppression. Precautions discussed. F/u if worsening or no improvement

## 2016-07-08 ENCOUNTER — Other Ambulatory Visit: Payer: Self-pay | Admitting: Unknown Physician Specialty

## 2016-07-12 ENCOUNTER — Encounter: Payer: BLUE CROSS/BLUE SHIELD | Admitting: Unknown Physician Specialty

## 2016-08-05 ENCOUNTER — Encounter: Payer: BLUE CROSS/BLUE SHIELD | Admitting: Unknown Physician Specialty

## 2016-08-09 ENCOUNTER — Encounter: Payer: Self-pay | Admitting: Unknown Physician Specialty

## 2016-08-09 ENCOUNTER — Ambulatory Visit (INDEPENDENT_AMBULATORY_CARE_PROVIDER_SITE_OTHER): Payer: BLUE CROSS/BLUE SHIELD | Admitting: Unknown Physician Specialty

## 2016-08-09 VITALS — BP 111/69 | HR 74 | Temp 98.7°F | Ht 65.8 in | Wt 202.3 lb

## 2016-08-09 DIAGNOSIS — G4733 Obstructive sleep apnea (adult) (pediatric): Secondary | ICD-10-CM | POA: Diagnosis not present

## 2016-08-09 DIAGNOSIS — F5101 Primary insomnia: Secondary | ICD-10-CM

## 2016-08-09 DIAGNOSIS — J45909 Unspecified asthma, uncomplicated: Secondary | ICD-10-CM | POA: Insufficient documentation

## 2016-08-09 DIAGNOSIS — J4540 Moderate persistent asthma, uncomplicated: Secondary | ICD-10-CM

## 2016-08-09 DIAGNOSIS — Z0000 Encounter for general adult medical examination without abnormal findings: Secondary | ICD-10-CM | POA: Diagnosis not present

## 2016-08-09 DIAGNOSIS — E669 Obesity, unspecified: Secondary | ICD-10-CM | POA: Diagnosis not present

## 2016-08-09 DIAGNOSIS — E785 Hyperlipidemia, unspecified: Secondary | ICD-10-CM | POA: Diagnosis not present

## 2016-08-09 DIAGNOSIS — F322 Major depressive disorder, single episode, severe without psychotic features: Secondary | ICD-10-CM | POA: Diagnosis not present

## 2016-08-09 DIAGNOSIS — Z6832 Body mass index (BMI) 32.0-32.9, adult: Secondary | ICD-10-CM | POA: Diagnosis not present

## 2016-08-09 DIAGNOSIS — J4521 Mild intermittent asthma with (acute) exacerbation: Secondary | ICD-10-CM

## 2016-08-09 MED ORDER — ALBUTEROL SULFATE HFA 108 (90 BASE) MCG/ACT IN AERS: 2.0000 | INHALATION_SPRAY | Freq: Four times a day (QID) | RESPIRATORY_TRACT | 2 refills | Status: DC | PRN

## 2016-08-09 MED ORDER — FLUOXETINE HCL 40 MG PO CAPS: 40.0000 mg | ORAL_CAPSULE | Freq: Every day | ORAL | 1 refills | Status: DC

## 2016-08-09 MED ORDER — BUDESONIDE-FORMOTEROL FUMARATE 160-4.5 MCG/ACT IN AERO: 2.0000 | INHALATION_SPRAY | Freq: Two times a day (BID) | RESPIRATORY_TRACT | 3 refills | Status: DC

## 2016-08-09 NOTE — Assessment & Plan Note (Signed)
This is not to goal.  Increase Fluoxetine to 40 mg

## 2016-08-09 NOTE — Progress Notes (Addendum)
BP 111/69   Pulse 74   Temp 98.7 F (37.1 C)   Ht 5' 5.8" (1.671 m)   Wt 202 lb 4.8 oz (91.8 kg)   SpO2 97%   BMI 32.85 kg/m    Subjective:    Patient ID: Kayla Wright, female    DOB: 03/12/1955, 61 y.o.   MRN: [id number]  HPI: Kayla Wright is a 61 y.o. female  Chief Complaint  Patient presents with  . Annual Exam   Depression State she gets frustrated as she can't go outside to do things due to ankle pain.  Taking Clonazepam every evening [facility] screen [facility] 2/9 08/09/2016 03/04/2016 01/27/2016 04/13/2015  Decreased Interest 2 1 3 3   Down, Depressed, Hopeless 2 1 3 3   PHQ - 2 Score 4 2 6 6   Altered sleeping 2 2 3 3   Tired, decreased energy 3 2 3 3   Change in appetite 2 1 2 2   Feeling bad or failure about yourself  1 0 2 3  Trouble concentrating 1 1 2 2   Moving slowly or fidgety/restless 1 1 2 3   Suicidal thoughts 1 0 2 3  PHQ-9 Score 15 9 22 25     Cough Finally gone with Flonaze and Zyrtec.  Taking Proventil once or twice a day.  Taking Symbicort 2 puffs once a day   Hyperlipidemia Using medications without problems: No Muscle aches  Diet compliance: Exercise:Unable to exercise die to ankle   Relevant past medical, surgical, family and social history reviewed and updated as indicated. Interim medical history since our last visit reviewed. Allergies and medications reviewed and updated.  Review of Systems  Constitutional: Negative.   HENT: Negative.   Cardiovascular: Negative.   Gastrointestinal: Negative.   Neurological: Negative.     Per HPI unless specifically indicated above     Objective:    BP 111/69   Pulse 74   Temp 98.7 F (37.1 C)   Ht 5' 5.8" (1.671 m)   Wt 202 lb 4.8 oz (91.8 kg)   SpO2 97%   BMI 32.85 kg/m   Wt Readings from Last 3 Encounters:  08/09/16 202 lb 4.8 oz (91.8 kg)  06/15/16 201 lb (91.2 kg)  06/06/16 203 lb (92.1 kg)    Physical Exam  Constitutional: She is oriented to person, place, and time. She appears  well-developed and well-nourished.  HENT:  Head: Normocephalic and atraumatic.  Eyes: Pupils are equal, round, and reactive to light. Right eye exhibits no discharge. Left eye exhibits no discharge. No scleral icterus.  Neck: Normal range of motion. Neck supple. Carotid bruit is not present. No thyromegaly present.  Cardiovascular: Normal rate, regular rhythm and normal heart sounds.  Exam reveals no gallop and no friction rub.   No murmur heard. Pulmonary/Chest: Effort normal and breath sounds normal. No respiratory distress. She has no wheezes. She has no rales.  Abdominal: Soft. Bowel sounds are normal. There is no tenderness. There is no rebound.  Genitourinary: No breast swelling, tenderness or discharge.  Musculoskeletal: Normal range of motion.  Lymphadenopathy:    She has no cervical adenopathy.  Neurological: She is alert and oriented to person, place, and time.  Skin: Skin is warm, dry and intact. No rash noted.  Several warty lesions on skin  Psychiatric: She has a normal mood and affect. Her speech is normal and behavior is normal. Judgment and thought content normal. Cognition and memory are normal.    Results for orders placed or performed in  visit on 01/27/16  Comprehensive metabolic panel  Result Value Ref Range   Glucose 87 65 - 99 mg/dL   BUN 9 6 - 24 mg/dL   Creatinine, Ser 4.09 (H) 0.57 - 1.00 mg/dL   GFR calc non Af Amer 53 (L) >59 mL/min/1.73   GFR calc Af Amer 61 >59 mL/min/1.73   BUN/Creatinine Ratio 8 (L) 9 - 23   Sodium 144 134 - 144 mmol/L   Potassium 3.6 3.5 - 5.2 mmol/L   Chloride 102 96 - 106 mmol/L   CO2 24 18 - 29 mmol/L   Calcium 8.7 8.7 - 10.2 mg/dL   Total Protein 6.6 6.0 - 8.5 g/dL   Albumin 4.6 3.5 - 5.5 g/dL   Globulin, Total 2.0 1.5 - 4.5 g/dL   Albumin/Globulin Ratio 2.3 (H) 1.2 - 2.2   Bilirubin Total 0.7 0.0 - 1.2 mg/dL   Alkaline Phosphatase 117 39 - 117 IU/L   AST 18 0 - 40 IU/L   ALT 16 0 - 32 IU/L  Lipid Panel w/o Chol/HDL Ratio    Result Value Ref Range   Cholesterol, Total 188 100 - 199 mg/dL   Triglycerides 811 (H) 0 - 149 mg/dL   HDL 43 >91 mg/dL   VLDL Cholesterol Cal 37 5 - 40 mg/dL   LDL Calculated 478 (H) 0 - 99 mg/dL  TSH  Result Value Ref Range   TSH 2.090 0.450 - 4.500 uIU/mL  VITAMIN D 25 Hydroxy (Vit-D Deficiency, Fractures)  Result Value Ref Range   Vit D, 25-Hydroxy 39.5 30.0 - 100.0 ng/mL      Assessment & Plan:   Problem List Items Addressed This Visit      Unprioritized   Asthma    Encouraged to increase Symbicort once a day to decrease reliance on rescue inhaler      Relevant Medications   budesonide-formoterol (SYMBICORT) 160-4.5 MCG/ACT inhaler   albuterol (PROVENTIL HFA;VENTOLIN HFA) 108 (90 Base) MCG/ACT inhaler   Hyperlipidemia    Check cholesterol      Relevant Orders   Lipid Panel w/o Chol/HDL Ratio   Insomnia    Taking Clonazepam nightly      Obesity (BMI 30-39.9)    Discussed diet.  Encouraged water exercise.  Discussed cutting out sugar drings      Severe depression (HCC)    This is not to goal.  Increase Fluoxetine to 40 mg      Relevant Medications   FLUoxetine (PROZAC) 40 MG capsule   Sleep apnea    Using CPAP       Other Visit Diagnoses    Annual physical exam    -  Primary   Relevant Orders   CBC with Differential/Platelet   Comprehensive metabolic panel   MM DIGITAL SCREENING BILATERAL   TSH   Mild intermittent reactive airway disease with acute exacerbation       Relevant Medications   budesonide-formoterol (SYMBICORT) 160-4.5 MCG/ACT inhaler   albuterol (PROVENTIL HFA;VENTOLIN HFA) 108 (90 Base) MCG/ACT inhaler       Follow up plan: Return in about 4 weeks (around 09/06/2016) for depression f/u and freeze skin lesions.

## 2016-08-09 NOTE — Assessment & Plan Note (Addendum)
Discussed diet.  Encouraged water exercise.  Discussed cutting out sugar drings

## 2016-08-09 NOTE — Assessment & Plan Note (Signed)
Check cholesterol.

## 2016-08-09 NOTE — Assessment & Plan Note (Signed)
Using CPAP 

## 2016-08-09 NOTE — Assessment & Plan Note (Signed)
Taking Clonazepam nightly

## 2016-08-09 NOTE — Patient Instructions (Addendum)
Please do call to schedule your mammogram; the number to schedule one at either [facility] or [facility] is [phone]   Preventive Care 40-64 Years, Female Preventive care refers to lifestyle choices and visits with your health care provider that can promote health and wellness. What does preventive care include?  A yearly physical exam. This is also called an annual well check.  Dental exams once or twice a year.  Routine eye exams. Ask your health care provider how often you should have your eyes checked.  Personal lifestyle choices, including: ? Daily care of your teeth and gums. ? Regular physical activity. ? Eating a healthy diet. ? Avoiding tobacco and drug use. ? Limiting alcohol use. ? Practicing safe sex. ? Taking low-dose aspirin daily starting at age 50. ? Taking vitamin and mineral supplements as recommended by your health care provider. What happens during an annual well check? The services and screenings done by your health care provider during your annual well check will depend on your age, overall health, lifestyle risk factors, and family history of disease. Counseling Your health care provider may ask you questions about your:  Alcohol use.  Tobacco use.  Drug use.  Emotional well-being.  Home and relationship well-being.  Sexual activity.  Eating habits.  Work and work environment.  Method of birth control.  Menstrual cycle.  Pregnancy history.  Screening You may have the following tests or measurements:  Height, weight, and BMI.  Blood pressure.  Lipid and cholesterol levels. These may be checked every 5 years, or more frequently if you are over 50 years old.  Skin check.  Lung cancer screening. You may have this screening every year starting at age 55 if you have a 30-pack-year history of smoking and currently smoke or have quit within the past 15 years.  Fecal occult blood test (FOBT) of the stool.  You may have this test every year starting at age 50.  Flexible sigmoidoscopy or colonoscopy. You may have a sigmoidoscopy every 5 years or a colonoscopy every 10 years starting at age 50.  Hepatitis C blood test.  Hepatitis B blood test.  Sexually transmitted disease (STD) testing.  Diabetes screening. This is done by checking your blood sugar (glucose) after you have not eaten for a while (fasting). You may have this done every 1-3 years.  Mammogram. This may be done every 1-2 years. Talk to your health care provider about when you should start having regular mammograms. This may depend on whether you have a family history of breast cancer.  BRCA-related cancer screening. This may be done if you have a family history of breast, ovarian, tubal, or peritoneal cancers.  Pelvic exam and Pap test. This may be done every 3 years starting at age 21. Starting at age 30, this may be done every 5 years if you have a Pap test in combination with an HPV test.  Bone density scan. This is done to screen for osteoporosis. You may have this scan if you are at high risk for osteoporosis.  Discuss your test results, treatment options, and if necessary, the need for more tests with your health care provider. Vaccines Your health care provider may recommend certain vaccines, such as:  Influenza vaccine. This is recommended every year.  Tetanus, diphtheria, and acellular pertussis (Tdap, Td) vaccine. You may need a Td booster every 10 years.  Varicella vaccine. You may need this if you have not been vaccinated.  Zoster vaccine. You may   need this after age 11.  Measles, mumps, and rubella (MMR) vaccine. You may need at least one dose of MMR if you were born in 1957 or later. You may also need a second dose.  Pneumococcal 13-valent conjugate (PCV13) vaccine. You may need this if you have certain conditions and were not previously vaccinated.  Pneumococcal polysaccharide (PPSV23) vaccine. You may need  one or two doses if you smoke cigarettes or if you have certain conditions.  Meningococcal vaccine. You may need this if you have certain conditions.  Hepatitis A vaccine. You may need this if you have certain conditions or if you travel or work in places where you may be exposed to hepatitis A.  Hepatitis B vaccine. You may need this if you have certain conditions or if you travel or work in places where you may be exposed to hepatitis B.  Haemophilus influenzae type b (Hib) vaccine. You may need this if you have certain conditions.  Talk to your health care provider about which screenings and vaccines you need and how often you need them. This information is not intended to replace advice given to you by your health care provider. Make sure you discuss any questions you have with your health care provider. Document Released: 02/06/2015 Document Revised: 09/30/2015 Document Reviewed: 11/11/2014 Elsevier Interactive Patient Education  2017 [employer].

## 2016-08-09 NOTE — Assessment & Plan Note (Signed)
Encouraged to increase Symbicort once a day to decrease reliance on rescue inhaler

## 2016-08-10 LAB — CBC WITH DIFFERENTIAL/PLATELET
Basophils Absolute: 0 10*3/uL (ref 0.0–0.2)
Basos: 0 %
EOS (ABSOLUTE): 0.1 10*3/uL (ref 0.0–0.4)
Eos: 1 %
Hematocrit: 40.4 % (ref 34.0–46.6)
Hemoglobin: 12.9 g/dL (ref 11.1–15.9)
Immature Grans (Abs): 0 10*3/uL (ref 0.0–0.1)
Immature Granulocytes: 0 %
Lymphocytes Absolute: 3.4 10*3/uL — ABNORMAL HIGH (ref 0.7–3.1)
Lymphs: 31 %
MCH: 29.9 pg (ref 26.6–33.0)
MCHC: 31.9 g/dL (ref 31.5–35.7)
MCV: 94 fL (ref 79–97)
Monocytes Absolute: 0.7 10*3/uL (ref 0.1–0.9)
Monocytes: 6 %
Neutrophils Absolute: 6.7 10*3/uL (ref 1.4–7.0)
Neutrophils: 62 %
Platelets: 262 10*3/uL (ref 150–379)
RBC: 4.32 x10E6/uL (ref 3.77–5.28)
RDW: 14.5 % (ref 12.3–15.4)
WBC: 10.9 10*3/uL — ABNORMAL HIGH (ref 3.4–10.8)

## 2016-08-10 LAB — COMPREHENSIVE METABOLIC PANEL
ALT: 15 IU/L (ref 0–32)
AST: 15 IU/L (ref 0–40)
Albumin/Globulin Ratio: 1.8 (ref 1.2–2.2)
Albumin: 4.4 g/dL (ref 3.6–4.8)
Alkaline Phosphatase: 96 IU/L (ref 39–117)
BUN/Creatinine Ratio: 13 (ref 12–28)
BUN: 13 mg/dL (ref 8–27)
Bilirubin Total: 0.5 mg/dL (ref 0.0–1.2)
CO2: 22 mmol/L (ref 20–29)
Calcium: 9.2 mg/dL (ref 8.7–10.3)
Chloride: 105 mmol/L (ref 96–106)
Creatinine, Ser: 0.97 mg/dL (ref 0.57–1.00)
GFR calc Af Amer: 73 mL/min/{1.73_m2} (ref >59)
GFR calc non Af Amer: 64 mL/min/{1.73_m2} (ref >59)
Globulin, Total: 2.4 g/dL (ref 1.5–4.5)
Glucose: 85 mg/dL (ref 65–99)
Potassium: 4.1 mmol/L (ref 3.5–5.2)
Sodium: 143 mmol/L (ref 134–144)
Total Protein: 6.8 g/dL (ref 6.0–8.5)

## 2016-08-10 LAB — LIPID PANEL W/O CHOL/HDL RATIO
Cholesterol, Total: 150 mg/dL (ref 100–199)
HDL: 40 mg/dL (ref >39)
LDL Calculated: 68 mg/dL (ref 0–99)
Triglycerides: 212 mg/dL — ABNORMAL HIGH (ref 0–149)
VLDL Cholesterol Cal: 42 mg/dL — ABNORMAL HIGH (ref 5–40)

## 2016-08-10 LAB — TSH: TSH: 1.41 u[IU]/mL (ref 0.450–4.500)

## 2016-08-12 ENCOUNTER — Encounter: Payer: Self-pay | Admitting: Unknown Physician Specialty

## 2016-08-23 ENCOUNTER — Ambulatory Visit
Admission: RE | Admit: 2016-08-23 | Discharge: 2016-08-23 | Disposition: A | Discharge status: Home / Self Care | Payer: BLUE CROSS/BLUE SHIELD | Source: Ambulatory Visit | Attending: Unknown Physician Specialty | Admitting: Unknown Physician Specialty

## 2016-08-23 DIAGNOSIS — Z0000 Encounter for general adult medical examination without abnormal findings: Secondary | ICD-10-CM | POA: Diagnosis not present

## 2016-08-23 DIAGNOSIS — Z1231 Encounter for screening mammogram for malignant neoplasm of breast: Secondary | ICD-10-CM | POA: Diagnosis present

## 2016-08-29 ENCOUNTER — Encounter: Payer: Self-pay | Admitting: Nurse Practitioner

## 2016-08-29 ENCOUNTER — Ambulatory Visit: Payer: BLUE CROSS/BLUE SHIELD | Attending: Nurse Practitioner | Admitting: Nurse Practitioner

## 2016-08-29 ENCOUNTER — Telehealth: Payer: Self-pay | Admitting: Unknown Physician Specialty

## 2016-08-29 DIAGNOSIS — J45909 Unspecified asthma, uncomplicated: Secondary | ICD-10-CM | POA: Insufficient documentation

## 2016-08-29 DIAGNOSIS — I129 Hypertensive chronic kidney disease with stage 1 through stage 4 chronic kidney disease, or unspecified chronic kidney disease: Secondary | ICD-10-CM | POA: Insufficient documentation

## 2016-08-29 DIAGNOSIS — F1190 Opioid use, unspecified, uncomplicated: Secondary | ICD-10-CM | POA: Insufficient documentation

## 2016-08-29 DIAGNOSIS — G894 Chronic pain syndrome: Secondary | ICD-10-CM | POA: Insufficient documentation

## 2016-08-29 DIAGNOSIS — F329 Major depressive disorder, single episode, unspecified: Secondary | ICD-10-CM | POA: Insufficient documentation

## 2016-08-29 DIAGNOSIS — M25572 Pain in left ankle and joints of left foot: Secondary | ICD-10-CM | POA: Diagnosis not present

## 2016-08-29 DIAGNOSIS — E785 Hyperlipidemia, unspecified: Secondary | ICD-10-CM | POA: Insufficient documentation

## 2016-08-29 DIAGNOSIS — Z79899 Other long term (current) drug therapy: Secondary | ICD-10-CM | POA: Insufficient documentation

## 2016-08-29 DIAGNOSIS — Z79891 Long term (current) use of opiate analgesic: Secondary | ICD-10-CM | POA: Insufficient documentation

## 2016-08-29 DIAGNOSIS — K219 Gastro-esophageal reflux disease without esophagitis: Secondary | ICD-10-CM | POA: Diagnosis not present

## 2016-08-29 DIAGNOSIS — G8929 Other chronic pain: Secondary | ICD-10-CM | POA: Insufficient documentation

## 2016-08-29 DIAGNOSIS — G4730 Sleep apnea, unspecified: Secondary | ICD-10-CM | POA: Diagnosis not present

## 2016-08-29 DIAGNOSIS — E669 Obesity, unspecified: Secondary | ICD-10-CM | POA: Diagnosis not present

## 2016-08-29 DIAGNOSIS — M79605 Pain in left leg: Secondary | ICD-10-CM | POA: Insufficient documentation

## 2016-08-29 DIAGNOSIS — Z6830 Body mass index (BMI) 30.0-30.9, adult: Secondary | ICD-10-CM | POA: Insufficient documentation

## 2016-08-29 DIAGNOSIS — E039 Hypothyroidism, unspecified: Secondary | ICD-10-CM | POA: Diagnosis not present

## 2016-08-29 NOTE — Progress Notes (Signed)
Safety precautions to be maintained throughout the outpatient stay will include: orient to surroundings, keep bed in low position, maintain call bell within reach at all times, provide assistance with transfer out of bed and ambulation.  

## 2016-08-29 NOTE — Telephone Encounter (Signed)
Patient requests to speak with Kayla Wright regarding pain medication. She was seen today at the pain management and they would not prescribe her any pain medication.   [phone]  Thanks

## 2016-08-29 NOTE — Telephone Encounter (Signed)
Routing to provider. According to pain management note, today's visit was just an initial visit that no medications are prescribed at.

## 2016-08-29 NOTE — Progress Notes (Signed)
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 will be required to reschedule your appointment.  3. Plan ahead:  Always assume that you will encounter traffic on your way in. Plan for it. If you are dependent on a driver, make sure they understand these rules and the need to arrive early. 4. Other appointments and responsibilities:  Avoid scheduling any other appointments before or after your pain clinic appointments.  5. Be prepared:  Write down everything that you need to discuss with your healthcare provider and give this information to the admitting nurse. Write down the medications that you will need refilled. Bring your pills and bottles (even the empty ones), to all of your appointments, except for those where a procedure is scheduled. 6. No children or pets:  Find someone to take care of them. It is not appropriate to bring them in. 7. Scheduling changes:  We request "advanced notification" of any changes or cancellations. 8. Advanced  notification:  Defined as a time period of more than 24 hours prior to the originally scheduled appointment. This allows for the appointment to be offered to other patients. 9. Rescheduling:  When a visit is rescheduled, it will require the cancellation of the original appointment. For this reason they both fall within  the category of "Cancellations".  10. Cancellations:  They require advanced notification. Any cancellation less than 24 hours before the  appointment will be recorded as a "No Show". 11. No Show:  Defined as an unkept appointment where the patient failed to notify or declare to the practice their intention or inability to keep the appointment.  Corrective process for repeat offenders:  1. Tardiness: Three (3) episodes of rescheduling due to late arrivals will be recorded as one (1) "No Show". 2. Cancellation or reschedule: Three (3) cancellations or rescheduling will be recorded as one (1) "No Show". 3. "No Shows": Three (3) "No Shows" within a 12 month period will result in discharge from the practice.  ____________________________________________________________________________________________  ____________________________________________________________________________________________  Pain Scale  Introduction: The pain score used by this practice is the Verbal Numerical Rating Scale (VNRS-11). This is an 11-point scale. It is for adults and children 10 years or older. There are significant differences in how the pain score is reported, used, and applied. Forget everything you learned in the past and learn this scoring system.  General Information: The scale should reflect your current level of pain. Unless you are specifically asked for the level of your worst pain, or your average pain. If you are asked for one of these two, then it should be understood that it is over the past 24 hours.  Basic Activities of Daily Living (ADL): Personal hygiene, dressing, eating,  transferring, and using restroom.  Instructions: Most patients tend to report their level of pain as a combination of two factors, their physical pain and their psychosocial pain. This last one is also known as "suffering" and it is reflection of how physical pain affects you socially and psychologically. From now on, report them separately. From this point on, when asked to report your pain level, report only your physical pain. Use the following table for reference.  Pain Clinic Pain Levels (0-5/10)  Pain Level Score  Description  No Pain 0   Mild pain 1 Nagging, annoying, but does not interfere with basic activities of daily living (ADL). Patients are able to eat, bathe, get dressed, toileting (being able to get on and off the toilet and perform personal hygiene functions), transfer (move in and out of bed or a chair without assistance), and maintain continence (able to control bladder and bowel functions). Blood pressure and heart rate are unaffected. A normal heart rate for a healthy adult ranges from 60 to 100 bpm (beats per minute).   Mild to moderate pain 2 Noticeable and distracting. Impossible to hide from other people. More frequent flare-ups. Still possible to adapt and function close to normal. It can be very annoying and may have occasional stronger flare-ups. With discipline, patients may get used to it and adapt.   Moderate pain 3 Interferes significantly with activities of daily living (ADL). It becomes difficult to feed, bathe, get dressed, get on and off the toilet or to perform personal hygiene functions. Difficult to get in and out of bed or a chair without assistance. Very distracting. With effort, it can be ignored when deeply involved in activities.   Moderately severe pain 4 Impossible to ignore for more than a few minutes. With effort, patients may still be able to manage work or participate in some social activities. Very difficult to concentrate. Signs of autonomic nervous  system discharge are evident: dilated pupils (mydriasis); mild sweating (diaphoresis); sleep interference. Heart rate becomes elevated (>115 bpm). Diastolic blood pressure (lower number) rises above 100 mmHg.  Patients find relief in laying down and not moving.   Severe pain 5 Intense and extremely unpleasant. Associated with frowning face and frequent crying. Pain overwhelms the senses.  Ability to do any activity or maintain social relationships becomes significantly limited. Conversation becomes difficult. Pacing back and forth is common, as getting into a comfortable position is nearly impossible. Pain wakes you up from deep sleep. Physical signs will be obvious: pupillary dilation; increased sweating; goosebumps; brisk reflexes; cold, clammy hands and feet; nausea, vomiting or dry heaves; loss of appetite; significant sleep disturbance with inability to fall asleep or to remain asleep. When persistent, significant weight loss is observed due to the complete loss of appetite and sleep deprivation.  Blood pressure and heart rate becomes significantly elevated. Caution: If elevated blood pressure triggers a pounding headache associated with blurred vision, then the patient should immediately seek attention at an urgent or emergency care unit, as these may be signs of an impending stroke.    Emergency Department Pain Levels (6-10/10)  Emergency Room Pain 6 Severely limiting. Requires emergency care and should not be seen or managed at an outpatient pain management facility. Communication becomes difficult and requires great effort. Assistance to reach the emergency department may be required. Facial flushing and profuse sweating along with potentially dangerous increases in heart rate and blood pressure will be evident.   Distressing pain 7 Self-care is very difficult. Assistance is required to transport, or use restroom. Assistance to reach the emergency department will be required. Tasks requiring  coordination, such as bathing and getting dressed become very difficult.   Disabling pain 8 Self-care is no longer possible. At this level, pain is disabling. The individual is unable to do even the most "basic" activities such as walking, eating, bathing, dressing, transferring to a bed, or toileting. Fine motor skills are lost. It is difficult to think clearly.   Incapacitating pain 9 Pain becomes incapacitating. Thought processing is no longer possible. Difficult to remember your own name. Control of movement and coordination are lost.   The worst pain imaginable 10 At this level, most patients pass out from pain. When this level is reached, collapse of the autonomic nervous system occurs, leading to a sudden drop in blood pressure and heart rate. This in turn results in a temporary and dramatic drop in blood flow to the brain, leading to a loss of consciousness. Fainting is one of the body's self defense mechanisms. Passing out puts the brain in a calmed state and causes it to shut down for a while, in order to begin the healing process.    Summary: 1. Refer to this scale when providing [place] with your pain level. 2. Be accurate and careful when reporting your pain level. This will help with your care. 3. Over-reporting your pain level will lead to loss of credibility. 4. Even a level of 1/10 means that there is pain and will be treated at our facility. 5. High, inaccurate reporting will be documented as "Symptom Exaggeration", leading to loss of credibility and suspicions of possible secondary gains such as obtaining more narcotics, or wanting to appear disabled, for fraudulent reasons. 6. Only pain levels of 5 or below will be seen at our facility. 7. Pain levels of 6 and above will be sent to the Emergency Department and the appointment cancelled. ____________________________________________________________________________________________

## 2016-08-29 NOTE — Patient Instructions (Signed)

## 2016-08-29 NOTE — Telephone Encounter (Signed)
She should call the provider who has been prescribing her Percocet

## 2016-08-30 LAB — SEDIMENTATION RATE: Sed Rate: 7 mm/hr (ref 0–40)

## 2016-08-30 LAB — VITAMIN B12: Vitamin B-12: 1284 pg/mL — ABNORMAL HIGH (ref 232–1245)

## 2016-08-30 LAB — MAGNESIUM: Magnesium: 2.9 mg/dL — ABNORMAL HIGH (ref 1.6–2.3)

## 2016-08-30 LAB — C-REACTIVE PROTEIN: CRP: 1.6 mg/L (ref 0.0–4.9)

## 2016-08-30 NOTE — Telephone Encounter (Signed)
Also asked for patient to please return my call with any questions in the VM I left.

## 2016-08-30 NOTE — Telephone Encounter (Signed)
Called and left patient a VM (not detailed) letting her know that Elnita MaxwellCheryl said she should contact the provider that has been prescribing her medication for her.

## 2016-09-01 LAB — COMPLIANCE DRUG ANALYSIS, UR

## 2016-09-13 ENCOUNTER — Ambulatory Visit (INDEPENDENT_AMBULATORY_CARE_PROVIDER_SITE_OTHER): Payer: BLUE CROSS/BLUE SHIELD | Admitting: Unknown Physician Specialty

## 2016-09-13 ENCOUNTER — Encounter: Payer: Self-pay | Admitting: Unknown Physician Specialty

## 2016-09-13 VITALS — BP 108/68 | HR 69 | Temp 98.5°F | Wt 203.4 lb

## 2016-09-13 DIAGNOSIS — F322 Major depressive disorder, single episode, severe without psychotic features: Secondary | ICD-10-CM | POA: Diagnosis not present

## 2016-09-13 DIAGNOSIS — L918 Other hypertrophic disorders of the skin: Secondary | ICD-10-CM

## 2016-09-13 DIAGNOSIS — L989 Disorder of the skin and subcutaneous tissue, unspecified: Secondary | ICD-10-CM

## 2016-09-13 MED ORDER — BUPROPION HCL ER (XL) 150 MG PO TB24: 150.0000 mg | ORAL_TABLET | Freq: Every day | ORAL | 2 refills | Status: DC

## 2016-09-13 NOTE — Progress Notes (Signed)
BP 108/68   Pulse 69   Temp 98.5 F (36.9 C)   Wt 203 lb 6.4 oz (92.3 kg)   SpO2 98%   BMI 30.93 kg/m    Subjective:    Patient ID: Kayla Wright, female    DOB: 1955/02/01, 61 y.o.   MRN: [id number]  HPI: Kayla Wright is a 61 y.o. female  Chief Complaint  Patient presents with  . Depression    4 week f/up  . Skin Tag Removal   Depression Increased Fluoxetine last visit.  Pt thinks this helped some but according to her PHQ 9 there is not much difference.  She has been on Cymbalta but felt fluoxetine worked the best.   Depression screen [facility] 2/9 09/13/2016 08/29/2016 08/09/2016 03/04/2016 01/27/2016  Decreased Interest 2 0 2 1 3   Down, Depressed, Hopeless 1 0 2 1 3   PHQ - 2 Score 3 0 4 2 6   Altered sleeping 2 - 2 2 3   Tired, decreased energy 1 - 3 2 3   Change in appetite 2 - 2 1 2   Feeling bad or failure about yourself  1 - 1 0 2  Trouble concentrating 1 - 1 1 2   Moving slowly or fidgety/restless 2 - 1 1 2   Suicidal thoughts 2 - 1 0 2  PHQ-9 Score 14 - 15 9 22     Relevant past medical, surgical, family and social history reviewed and updated as indicated. Interim medical history since our last visit reviewed. Allergies and medications reviewed and updated.  Review of Systems  Per HPI unless specifically indicated above     Objective:    BP 108/68   Pulse 69   Temp 98.5 F (36.9 C)   Wt 203 lb 6.4 oz (92.3 kg)   SpO2 98%   BMI 30.93 kg/m   Wt Readings from Last 3 Encounters:  09/13/16 203 lb 6.4 oz (92.3 kg)  08/29/16 198 lb (89.8 kg)  08/09/16 202 lb 4.8 oz (91.8 kg)    Physical Exam  Constitutional: She is oriented to person, place, and time. She appears well-developed and well-nourished. No distress.  HENT:  Head: Normocephalic and atraumatic.  Eyes: Conjunctivae and lids are normal. Right eye exhibits no discharge. Left eye exhibits no discharge. No scleral icterus.  Neck: Normal range of motion. Neck supple. No JVD present. Carotid bruit is not present.    Cardiovascular: Normal rate, regular rhythm and normal heart sounds.   Pulmonary/Chest: Effort normal and breath sounds normal.  Abdominal: Normal appearance. There is no splenomegaly or hepatomegaly.  Musculoskeletal: Normal range of motion.  Neurological: She is alert and oriented to person, place, and time.  Skin: Skin is warm, dry and intact. No rash noted. No pallor.  3 skin lesions, One on right upper thigh, one left upper thigh and one at hairline.  All flat and scaly.  Cryoed  Psychiatric: She has a normal mood and affect. Her behavior is normal. Judgment and thought content normal.    Results for orders placed or performed in visit on 08/29/16  Compliance Drug Analysis, Ur  Result Value Ref Range   Summary FINAL   C-reactive protein  Result Value Ref Range   CRP 1.6 0.0 - 4.9 mg/L  Sedimentation rate  Result Value Ref Range   Sed Rate 7 0 - 40 mm/hr  Magnesium  Result Value Ref Range   Magnesium 2.9 (H) 1.6 - 2.3 mg/dL  Vitamin L45  Result Value Ref Range  Vitamin B-12 1,284 (H) 232 - 1,245 pg/mL      Assessment & Plan:   Problem List Items Addressed This Visit      Unprioritized   Major depressive disorder, single episode, unspecified    Depression.  Not to goal.  Add Buproprion in the AM.  Discussed exercise and trying to stay active.        Relevant Medications   FLUoxetine (PROZAC) 40 MG capsule   buPROPion (WELLBUTRIN XL) 150 MG 24 hr tablet    Other Visit Diagnoses    Skin lesions    -  Primary   all cryoed.  RTC or dermatology referral if no improvement       Follow up plan: Return in about 4 weeks (around 10/11/2016).

## 2016-09-13 NOTE — Assessment & Plan Note (Addendum)
Depression.  Not to goal.  Add Buproprion in the AM.  Discussed exercise and trying to stay active.

## 2016-09-28 ENCOUNTER — Other Ambulatory Visit: Payer: Self-pay | Admitting: Unknown Physician Specialty

## 2016-10-09 DIAGNOSIS — G8929 Other chronic pain: Secondary | ICD-10-CM | POA: Insufficient documentation

## 2016-10-09 DIAGNOSIS — M25572 Pain in left ankle and joints of left foot: Secondary | ICD-10-CM

## 2016-10-09 DIAGNOSIS — Z79899 Other long term (current) drug therapy: Secondary | ICD-10-CM | POA: Insufficient documentation

## 2016-10-09 NOTE — Progress Notes (Signed)
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 or any other prescription from our practice, on "Automatic Refill". Patient may have prescription filled one day early if pharmacy is closed on scheduled refill date. Do not fill until: 10/10/16 To last until: 11/09/16   Procedure Orders    No procedure(s) ordered today   Lab Orders  No laboratory test(s) ordered today   Imaging Orders  No imaging studies ordered today   Referral Orders  No referral(s) requested today    Pharmacological management options:  Opioid Analgesics: We'll take over management today. See above orders. Today we will switch the patient from the Percocet to oxycodone IR 12 midnight the chronic use of APAP. Membrane stabilizer: We have discussed the possibility of optimizing this mode of therapy, if tolerated Muscle relaxant: We have discussed the possibility of a trial NSAID: We have discussed the possibility of a  trial Other analgesic(s): To be determined at a later time   Interventional management options: Planned, scheduled, and/or pending:    At this point we will hold on starting any injections until she improves from her shingles. Should she continue to have pain, we have instructed the patient to give [place] a call so as to do a caudal epidural steroid injection for her shingles.    Considering:   Diagnostic left lower extremity nerve conduction test  Diagnostic right-sided caudal epidural steroid injection  Diagnostic Left Ankle Block  Possible right sided spinal cord stimulator trial    PRN Procedures:   To be determined at a later time   Provider-requested follow-up: Return in about 1 month (around 11/09/2016) for Med-Mgmt by Dr. Dossie Arbour.  Future Appointments Date Time Provider [place]  10/12/2016 9:30 AM Volney American, PA-C [facility] None  11/09/2016 1:30 PM Milinda Pointer, MD [facility] None    Primary Care Physician: Kathrine Haddock, NP Location: [facility] Note by: Gaspar Cola, MD Date: 10/10/2016; Time: 9:21 AM

## 2016-10-10 ENCOUNTER — Encounter: Payer: Self-pay | Admitting: Pain Medicine

## 2016-10-10 ENCOUNTER — Ambulatory Visit: Payer: BLUE CROSS/BLUE SHIELD | Attending: Pain Medicine | Admitting: Pain Medicine

## 2016-10-10 VITALS — BP 130/72 | HR 74 | Temp 98.1°F | Resp 16 | Ht 66.0 in | Wt 198.0 lb

## 2016-10-10 DIAGNOSIS — M5137 Other intervertebral disc degeneration, lumbosacral region: Secondary | ICD-10-CM | POA: Insufficient documentation

## 2016-10-10 DIAGNOSIS — B029 Zoster without complications: Secondary | ICD-10-CM | POA: Diagnosis not present

## 2016-10-10 DIAGNOSIS — Z79891 Long term (current) use of opiate analgesic: Secondary | ICD-10-CM | POA: Diagnosis not present

## 2016-10-10 DIAGNOSIS — Z8781 Personal history of (healed) traumatic fracture: Secondary | ICD-10-CM | POA: Diagnosis not present

## 2016-10-10 DIAGNOSIS — I10 Essential (primary) hypertension: Secondary | ICD-10-CM | POA: Insufficient documentation

## 2016-10-10 DIAGNOSIS — M5030 Other cervical disc degeneration, unspecified cervical region: Secondary | ICD-10-CM | POA: Insufficient documentation

## 2016-10-10 DIAGNOSIS — F329 Major depressive disorder, single episode, unspecified: Secondary | ICD-10-CM | POA: Diagnosis not present

## 2016-10-10 DIAGNOSIS — E785 Hyperlipidemia, unspecified: Secondary | ICD-10-CM | POA: Insufficient documentation

## 2016-10-10 DIAGNOSIS — G894 Chronic pain syndrome: Secondary | ICD-10-CM | POA: Diagnosis not present

## 2016-10-10 DIAGNOSIS — M25552 Pain in left hip: Secondary | ICD-10-CM | POA: Diagnosis not present

## 2016-10-10 DIAGNOSIS — M8588 Other specified disorders of bone density and structure, other site: Secondary | ICD-10-CM

## 2016-10-10 DIAGNOSIS — Z79899 Other long term (current) drug therapy: Secondary | ICD-10-CM

## 2016-10-10 DIAGNOSIS — F1190 Opioid use, unspecified, uncomplicated: Secondary | ICD-10-CM

## 2016-10-10 DIAGNOSIS — M25572 Pain in left ankle and joints of left foot: Secondary | ICD-10-CM | POA: Diagnosis not present

## 2016-10-10 DIAGNOSIS — M4802 Spinal stenosis, cervical region: Secondary | ICD-10-CM | POA: Insufficient documentation

## 2016-10-10 DIAGNOSIS — M9981 Other biomechanical lesions of cervical region: Secondary | ICD-10-CM | POA: Diagnosis not present

## 2016-10-10 DIAGNOSIS — G8929 Other chronic pain: Secondary | ICD-10-CM

## 2016-10-10 DIAGNOSIS — K219 Gastro-esophageal reflux disease without esophagitis: Secondary | ICD-10-CM | POA: Diagnosis not present

## 2016-10-10 DIAGNOSIS — M25551 Pain in right hip: Secondary | ICD-10-CM | POA: Diagnosis not present

## 2016-10-10 DIAGNOSIS — M79605 Pain in left leg: Secondary | ICD-10-CM

## 2016-10-10 DIAGNOSIS — F1390 Sedative, hypnotic, or anxiolytic use, unspecified, uncomplicated: Secondary | ICD-10-CM | POA: Insufficient documentation

## 2016-10-10 DIAGNOSIS — G4730 Sleep apnea, unspecified: Secondary | ICD-10-CM | POA: Insufficient documentation

## 2016-10-10 DIAGNOSIS — M5136 Other intervertebral disc degeneration, lumbar region: Secondary | ICD-10-CM

## 2016-10-10 MED ORDER — PREDNISONE 20 MG PO TABS: ORAL_TABLET | ORAL | 0 refills | Status: AC

## 2016-10-10 MED ORDER — OXYCODONE HCL 5 MG PO TABS: 5.0000 mg | ORAL_TABLET | Freq: Four times a day (QID) | ORAL | 0 refills | Status: DC | PRN

## 2016-10-10 MED ORDER — VALACYCLOVIR HCL 1 G PO TABS: 1000.0000 mg | ORAL_TABLET | Freq: Three times a day (TID) | ORAL | 0 refills | Status: DC

## 2016-10-10 NOTE — Patient Instructions (Signed)

## 2016-10-10 NOTE — Progress Notes (Signed)
Safety precautions to be maintained throughout the outpatient stay will include: orient to surroundings, keep bed in low position, maintain call bell within reach at all times, provide assistance with transfer out of bed and ambulation.  

## 2016-10-11 ENCOUNTER — Ambulatory Visit: Payer: BLUE CROSS/BLUE SHIELD | Admitting: Unknown Physician Specialty

## 2016-10-11 DIAGNOSIS — M5030 Other cervical disc degeneration, unspecified cervical region: Secondary | ICD-10-CM | POA: Insufficient documentation

## 2016-10-11 DIAGNOSIS — M51369 Other intervertebral disc degeneration, lumbar region without mention of lumbar back pain or lower extremity pain: Secondary | ICD-10-CM | POA: Insufficient documentation

## 2016-10-11 DIAGNOSIS — M8588 Other specified disorders of bone density and structure, other site: Secondary | ICD-10-CM | POA: Insufficient documentation

## 2016-10-11 DIAGNOSIS — B029 Zoster without complications: Secondary | ICD-10-CM | POA: Insufficient documentation

## 2016-10-11 DIAGNOSIS — Z8781 Personal history of (healed) traumatic fracture: Secondary | ICD-10-CM | POA: Insufficient documentation

## 2016-10-11 DIAGNOSIS — M4802 Spinal stenosis, cervical region: Secondary | ICD-10-CM | POA: Insufficient documentation

## 2016-10-11 DIAGNOSIS — M5136 Other intervertebral disc degeneration, lumbar region: Secondary | ICD-10-CM | POA: Insufficient documentation

## 2016-10-12 ENCOUNTER — Ambulatory Visit (INDEPENDENT_AMBULATORY_CARE_PROVIDER_SITE_OTHER): Payer: BLUE CROSS/BLUE SHIELD | Admitting: Family Medicine

## 2016-10-12 ENCOUNTER — Encounter: Payer: Self-pay | Admitting: Family Medicine

## 2016-10-12 VITALS — BP 117/71 | HR 74 | Temp 98.4°F | Wt 199.0 lb

## 2016-10-12 DIAGNOSIS — B029 Zoster without complications: Secondary | ICD-10-CM

## 2016-10-12 DIAGNOSIS — F322 Major depressive disorder, single episode, severe without psychotic features: Secondary | ICD-10-CM

## 2016-10-12 MED ORDER — GABAPENTIN 300 MG PO CAPS
300.0000 mg | ORAL_CAPSULE | Freq: Three times a day (TID) | ORAL | 0 refills | Status: DC
Start: 2016-10-12 — End: 2016-11-09

## 2016-10-12 NOTE — Patient Instructions (Signed)
Stop taking meloxicam while on the prednisone. Can resume after completing the course.

## 2016-10-12 NOTE — Assessment & Plan Note (Signed)
Stable on current medications, was due for f/u today but given significant shingles episode,will follow up in 1 more month to review these medicines.

## 2016-10-12 NOTE — Progress Notes (Signed)
BP 117/71   Pulse 74   Temp 98.4 F (36.9 C)   Wt 199 lb (90.3 kg)   SpO2 95%   BMI 32.12 kg/m    Subjective:    Patient ID: Kayla Wright, female    DOB: 1955-07-22, 61 y.o.   MRN: [id number]  HPI: Kayla Wright is a 61 y.o. female  Chief Complaint  Patient presents with  . Herpes Zoster    x 6 days, on her buttocks. Saw Pain clinic Mon. and he started her on Prednisone and Valtrex. States that it does appear to be improving.   Patient presents with 6 days of severe burning pain and vesicular rash on right buttock. Went to pain management appt Monday and was given valtrex and prednisone, which seem to be helping some. Now having additional burning pains shooting toward right labia and anus.   Was also due for depression f/u after starting new medication, but pt wanting to hold off for another month since things are not at baseline right now. Will re-visit this at next appt. States things are stable.   Past Medical History:  Diagnosis Date  . Allergy   . Common migraine   . Depression   . GERD (gastroesophageal reflux disease)   . Hyperlipidemia   . Hypertension   . Insomnia   . Menopausal symptom   . Osteopenia   . Thyroid disease    Social History   Social History  . Marital status: Divorced    Spouse name: N/A  . Number of children: N/A  . Years of education: N/A   Occupational History  . Not on file.   Social History Main Topics  . Smoking status: Never Smoker  . Smokeless tobacco: Never Used  . Alcohol use No  . Drug use: No  . Sexual activity: No   Other Topics Concern  . Not on file   Social History Narrative  . No narrative on file   Relevant past medical, surgical, family and social history reviewed and updated as indicated. Interim medical history since our last visit reviewed. Allergies and medications reviewed and updated.  Review of Systems  Constitutional: Negative.   HENT: Negative.   Respiratory: Negative.   Cardiovascular: Negative.    Gastrointestinal: Negative.   Genitourinary: Positive for vaginal pain.  Musculoskeletal: Negative.   Skin: Positive for rash.  Neurological: Negative.   Psychiatric/Behavioral: Negative.    Per HPI unless specifically indicated above     Objective:    BP 117/71   Pulse 74   Temp 98.4 F (36.9 C)   Wt 199 lb (90.3 kg)   SpO2 95%   BMI 32.12 kg/m   Wt Readings from Last 3 Encounters:  10/12/16 199 lb (90.3 kg)  10/10/16 198 lb (89.8 kg)  09/13/16 203 lb 6.4 oz (92.3 kg)    Physical Exam  Constitutional: She is oriented to person, place, and time. She appears well-developed and well-nourished. No distress.  HENT:  Head: Atraumatic.  Eyes: Pupils are equal, round, and reactive to light. Conjunctivae are normal.  Neck: Normal range of motion. Neck supple.  Cardiovascular: Normal rate and normal heart sounds.   Pulmonary/Chest: Effort normal and breath sounds normal. No respiratory distress.  Musculoskeletal: Normal range of motion.  Neurological: She is alert and oriented to person, place, and time.  Skin: Skin is warm and dry. Rash (Erythematous, vesicular rash on right buttock) noted.  Psychiatric: She has a normal mood and affect. Her behavior is  normal.  Nursing note and vitals reviewed.     Assessment & Plan:   Problem List Items Addressed This Visit      Other   Major depressive disorder, single episode, unspecified    Stable on current medications, was due for f/u today but given significant shingles episode,will follow up in 1 more month to review these medicines.       Acute Herpes zoster without complication - Primary    Complete prednisone and valtrex. Gabapentin sent for some localized neuralgia sxs. Will re-assess at upcoming f/u and discuss Shingrix vaccine          Follow up plan: Return in about 4 weeks (around 11/09/2016) for Depression f/u.

## 2016-10-12 NOTE — Assessment & Plan Note (Signed)
Complete prednisone and valtrex. Gabapentin sent for some localized neuralgia sxs. Will re-assess at upcoming f/u and discuss Shingrix vaccine

## 2016-11-08 NOTE — Progress Notes (Signed)
Patient's Name: Kayla Wright  MRN: [id number]  Referring Provider: Kathrine Haddock, NP  DOB: 05/31/1955  PCP: Kayla Haddock, NP  DOS: 11/09/2016  Note by: Kayla Cola, MD  Service setting: Ambulatory outpatient  Specialty: Interventional Pain Management  Location: [facility] (AMB) Pain Management Facility    Patient type: Established   Primary Reason(s) for Visit: Encounter for prescription drug management. (Level of risk: moderate)  CC: Ankle Pain (left)  HPI  [name] is a 61 y.o. year old, female patient, who comes today for a medication management evaluation. She has Sleep apnea; Severe depression ([place]); Chronic anxiety; Hyperlipidemia, unspecified; Hypothyroidism, unspecified; Insomnia; Allergic rhinitis; Obesity (BMI 30-39.9); Asthma; Anxiety disorder, unspecified; Major depressive disorder, single episode, unspecified; Murmur; SOB (shortness of breath) on exertion; Chronic lower extremity pain (Left); Chronic pain syndrome; Long term current use of opiate analgesic; Long term prescription opiate use; Opiate use; Chronic ankle pain (Primary Area of Pain) (Left); Long term prescription benzodiazepine use; Shingles rash (Secondary Area of Pain) (Right) (Buttocks); History of ankle fracture (Left); Acute Herpes zoster without complication; DDD (degenerative disc disease), cervical (C5-6 and C6-7); Chronic Cervical foraminal stenosis (C3-4) (Right); Osteopenia of lumbar spine; DDD (degenerative disc disease), lumbar (L1-2 and L5-S1); Osteoporosis of lumbar spine; Unspecified fracture of left talus, sequela (left ankle pain); Neurogenic pain; and Chronic bilateral low back pain with left-sided sciatica on her problem list. Her primarily concern today is the Ankle Pain (left)  Pain Assessment: Location: Left Ankle Radiating: ankle, lower leg is numb Onset: More than a month ago Duration: Chronic pain Quality: Burning, Aching, Constant, Radiating Severity: 7 /10 (self-reported pain score)  Note:  Reported level is compatible with observation.                   When using our objective Pain Scale, levels between 6 and 10/10 are said to belong in an emergency room, as it progressively worsens from a 6/10, described as severely limiting, requiring emergency care not usually available at an outpatient pain management facility. At a 6/10 level, communication becomes difficult and requires great effort. Assistance to reach the emergency department may be required. Facial flushing and profuse sweating along with potentially dangerous increases in heart rate and blood pressure will be evident. Effect on ADL: pace self Timing: Constant Modifying factors: medicine  [name] was last scheduled for an appointment on 10/10/2016 for medication management. During today's appointment we reviewed [name]'s chronic pain status, as well as her outpatient medication regimen.  The patient  reports that she does not use drugs. Her body mass index is 29.95 kg/m.  Further details on both, my assessment(s), as well as the proposed treatment plan, please see below.  Controlled Substance Pharmacotherapy Assessment REMS (Risk Evaluation and Mitigation Strategy)  Analgesic: oxycodone IR 5 mg 1 tablet by mouth 4 times a day (20 mg/day) (30 MME/day Highest recorded MME/day:56.76m/day MME/day:334mday  PoLona MillardRN  11/09/2016  2:19 PM  Sign at close encounter Nursing Pain Medication Assessment:  Safety precautions to be maintained throughout the outpatient stay will include: orient to surroundings, keep bed in low position, maintain call bell within reach at all times, provide assistance with transfer out of bed and ambulation.  Medication Inspection Compliance: Pill count conducted under aseptic conditions, in front of the patient. Neither the pills nor the bottle was removed from the patient's sight at any time. Once count was completed pills were immediately returned to the patient in their original  bottle.  Medication: Oxycodone IR Pill/Patch Count: 2 of 120 pills remain Pill/Patch Appearance: Markings consistent with prescribed medication Bottle Appearance: Standard pharmacy container. Clearly labeled. Filled Date: 09 / 17 / 2018 Last Medication intake:  Today   Pharmacokinetics: Liberation and absorption (onset of action): WNL Distribution (time to peak effect): WNL Metabolism and excretion (duration of action): WNL         Pharmacodynamics: Desired effects: Analgesia: [name] reports >50% benefit. Functional ability: Patient reports that medication allows her to accomplish basic ADLs Clinically meaningful improvement in function (CMIF): Sustained CMIF goals met Perceived effectiveness: Described as relatively effective, allowing for increase in activities of daily living (ADL) Undesirable effects: Side-effects or Adverse reactions: None reported Monitoring: [place] PMP: Online review of the past 19-monthperiod conducted. Compliant with practice rules and regulations Last UDS on record: Summary  Date Value Ref Range Status  08/29/2016 FINAL  Final    Comment:    ==================================================================== TOXASSURE COMP DRUG ANALYSIS,UR ==================================================================== Test                             Result       Flag       Units Drug Present not Declared for Prescription Verification   Fluoxetine                     PRESENT      UNEXPECTED   Norfluoxetine                  PRESENT      UNEXPECTED    Norfluoxetine is an expected metabolite of fluoxetine. Drug Absent but Declared for Prescription Verification   Clonazepam                     Not Detected UNEXPECTED ng/mg creat   Oxycodone                      Not Detected UNEXPECTED ng/mg creat   Acetaminophen                  Not Detected UNEXPECTED    Acetaminophen, as indicated in the declared medication list, is    not always detected even when used as  directed.   Diclofenac                     Not Detected UNEXPECTED    Diclofenac, as indicated in the declared medication list, is not    always detected even when used as directed. ==================================================================== Test                      Result    Flag   Units      Ref Range   Creatinine              119              mg/dL      >=20 ==================================================================== Declared Medications:  The flagging and interpretation on this report are based on the  following declared medications.  Unexpected results may arise from  inaccuracies in the declared medications.  **Note: The testing scope of this panel includes these medications:  Clonazepam (Klonopin)  Oxycodone (Oxycodone Acetaminophen)  **Note: The testing scope of this panel does not include small to  moderate amounts of these reported medications:  Acetaminophen (Oxycodone Acetaminophen)  Diclofenac (Voltaren)  **Note: The testing  scope of this panel does not include following  reported medications:  Albuterol  Budenoside (Symbicort)  Cetirizine (Zyrtec)  Cyanocobalamin  Fluticasone (Flonase)  Formoterol (Symbicort)  Levothyroxine  Simvastatin (Zocor)  Sumatriptan (Imitrex)  Vitamin C  Vitamin D3 ==================================================================== For clinical consultation, please call [phone]. ====================================================================    UDS interpretation: Compliant          Medication Assessment Form: Reviewed. Patient indicates being compliant with therapy Treatment compliance: Compliant Risk Assessment Profile: Aberrant behavior: See prior evaluations. None observed or detected today Comorbid factors increasing risk of overdose: See prior notes. No additional risks detected today Risk of substance use disorder (SUD): Low     Opioid Risk Tool - 11/09/16 1413      Family History of  Substance Abuse   Alcohol Negative   Illegal Drugs Negative   Rx Drugs Negative     Personal History of Substance Abuse   Alcohol Negative   Illegal Drugs Negative   Rx Drugs Negative     Age   Age between 59-45 years  No     History of Preadolescent Sexual Abuse   History of Preadolescent Sexual Abuse Negative or Female     Psychological Disease   Psychological Disease Negative   Depression Negative     Total Score   Opioid Risk Tool Scoring 0   Opioid Risk Interpretation Low Risk     ORT Scoring interpretation table:  Score <3 = Low Risk for SUD  Score between 4-7 = Moderate Risk for SUD  Score >8 = High Risk for Opioid Abuse   Risk Mitigation Strategies:  Patient Counseling: Covered Patient-Prescriber Agreement (PPA): Present and active  Notification to other healthcare providers: Done  Pharmacologic Plan: No change in therapy, at this time  Laboratory Chemistry  Inflammation Markers (CRP: Acute Phase) (ESR: Chronic Phase) Lab Results  Component Value Date   CRP 1.6 08/29/2016   ESRSEDRATE 7 08/29/2016                 Renal Function Markers Lab Results  Component Value Date   BUN 13 08/09/2016   CREATININE 0.97 08/09/2016   GFRAA 73 08/09/2016   GFRNONAA 64 08/09/2016                 Hepatic Function Markers Lab Results  Component Value Date   AST 15 08/09/2016   ALT 15 08/09/2016   ALBUMIN 4.4 08/09/2016   ALKPHOS 96 08/09/2016                 Electrolytes Lab Results  Component Value Date   NA 143 08/09/2016   K 4.1 08/09/2016   CL 105 08/09/2016   CALCIUM 9.2 08/09/2016   MG 2.9 (H) 08/29/2016                 Neuropathy Markers Lab Results  Component Value Date   VITAMINB12 1,284 (H) 08/29/2016                 Bone Pathology Markers Lab Results  Component Value Date   ALKPHOS 96 08/09/2016   VD25OH 39.5 01/27/2016   CALCIUM 9.2 08/09/2016                 Coagulation Parameters Lab Results  Component Value Date   PLT 262  08/09/2016                 Cardiovascular Markers Lab Results  Component Value Date   HGB 12.9 08/09/2016  HCT 40.4 08/09/2016                 Note: Lab results reviewed.  Recent Diagnostic Imaging Results  MM DIGITAL SCREENING BILATERAL CLINICAL DATA:  Screening.  EXAM: DIGITAL SCREENING BILATERAL MAMMOGRAM WITH CAD  COMPARISON:  Previous exam(s).  ACR Breast Density Category b: There are scattered areas of fibroglandular density.  FINDINGS: There are no findings suspicious for malignancy. Images were processed with CAD.  IMPRESSION: No mammographic evidence of malignancy. A result letter of this screening mammogram will be mailed directly to the patient.  RECOMMENDATION: Screening mammogram in one year. (Code:[id number]-B-01Y)  BI-RADS CATEGORY  1: Negative.  Electronically Signed   By: Pamelia Hoit M.D.   On: 08/24/2016 13:15  Complexity Note: Imaging results reviewed. Results shared with Ms. Jenne Campus, using Layman's terms.                         Meds   Current Outpatient Prescriptions:  .  albuterol (PROVENTIL HFA;VENTOLIN HFA) 108 (90 Base) MCG/ACT inhaler, Inhale 2 puffs into the lungs every 6 (six) hours as needed for wheezing or shortness of breath., Disp: 1 Inhaler, Rfl: 2 .  ARIPiprazole (ABILIFY) 10 MG tablet, 10 mg daily. , Disp: , Rfl:  .  Ascorbic Acid (VITAMIN C) 1000 MG tablet, Take 1,000 mg by mouth daily., Disp: , Rfl:  .  budesonide-formoterol (SYMBICORT) 160-4.5 MCG/ACT inhaler, Inhale 2 puffs into the lungs 2 (two) times daily., Disp: 1 Inhaler, Rfl: 3 .  buPROPion (WELLBUTRIN XL) 150 MG 24 hr tablet, Take 1 tablet (150 mg total) by mouth daily., Disp: 30 tablet, Rfl: 2 .  cetirizine (ZYRTEC) 10 MG tablet, Take 1 tablet (10 mg total) by mouth daily., Disp: 30 tablet, Rfl: 11 .  Cholecalciferol (VITAMIN D3) 5000 units CAPS, Take 5,000 Units by mouth daily., Disp: , Rfl:  .  clonazePAM (KLONOPIN) 1 MG tablet, Take 1 mg by mouth daily. , Disp: , Rfl:  .   Cyanocobalamin (VITAMIN B-12 PO), Take 1 tablet by mouth daily., Disp: , Rfl:  .  cyclobenzaprine (FLEXERIL) 10 MG tablet, Take 10 mg by mouth daily at 6 (six) AM. , Disp: , Rfl:  .  DULoxetine (CYMBALTA) 60 MG capsule, Take 60 mg by mouth daily. , Disp: , Rfl:  .  FLUoxetine (PROZAC) 40 MG capsule, Take 1 capsule by mouth daily., Disp: , Rfl: 0 .  fluticasone (FLONASE) 50 MCG/ACT nasal spray, Place 2 sprays into both nostrils daily., Disp: 16 g, Rfl: 6 .  gabapentin (NEURONTIN) 300 MG capsule, Take 1 capsule (300 mg total) by mouth 3 (three) times daily., Disp: 90 capsule, Rfl: 0 .  levothyroxine (SYNTHROID, LEVOTHROID) 50 MCG tablet, Take 1 tablet (50 mcg total) by mouth daily before breakfast., Disp: 90 tablet, Rfl: 3 .  LORazepam (ATIVAN) 0.5 MG tablet, TAKE ONE TABLET BY MOUTH AT BEDTIME AS NEEDED., Disp: , Rfl:  .  meloxicam (MOBIC) 15 MG tablet, Take 15 mg by mouth daily., Disp: , Rfl: 0 .  omeprazole (PRILOSEC) 20 MG capsule, Take 20 mg by mouth daily. , Disp: , Rfl:  .  oxyCODONE (OXY IR/ROXICODONE) 5 MG immediate release tablet, Take 1 tablet (5 mg total) by mouth 2 (two) times daily., Disp: 60 tablet, Rfl: 0 .  simvastatin (ZOCOR) 40 MG tablet, TAKE 1 TABLET BY MOUTH ONCE DAILY, Disp: 90 tablet, Rfl: 1 .  SUMAtriptan (IMITREX) 50 MG tablet, Take 50 mg by mouth every 2 (  two) hours as needed for migraine. May repeat in 2 hours if headache persists or recurs., Disp: , Rfl:   ROS  Constitutional: Denies any fever or chills Gastrointestinal: No reported hemesis, hematochezia, vomiting, or acute GI distress Musculoskeletal: Denies any acute onset joint swelling, redness, loss of ROM, or weakness Neurological: No reported episodes of acute onset apraxia, aphasia, dysarthria, agnosia, amnesia, paralysis, loss of coordination, or loss of consciousness  Allergies  Ms. Amacher has No Known Allergies.  PFSH  Drug: [name]  reports that she does not use drugs. Alcohol:  reports that she does  not drink alcohol. Tobacco:  reports that she has never smoked. She has never used smokeless tobacco. Medical:  has a past medical history of Allergy; Common migraine; Depression; GERD (gastroesophageal reflux disease); Hyperlipidemia; Hypertension; Insomnia; Menopausal symptom; Osteopenia; and Thyroid disease. Surgical: Ms. Lasure  has a past surgical history that includes Tubal ligation and Ankle fracture surgery (06/18/15). Family: family history includes Cirrhosis in her mother; Diabetes in her mother; Heart disease in her father and mother.  Constitutional Exam  General appearance: Well nourished, well developed, and well hydrated. In no apparent acute distress Vitals:   11/09/16 1400  BP: 118/67  Pulse: 72  Resp: 16  Temp: 98.1 F (36.7 C)  SpO2: 96%  Weight: 197 lb (89.4 kg)  Height: _0  (1.727 m)   BMI Assessment: Estimated body mass index is 29.95 kg/m as calculated from the following:   Height as of this encounter: _1  (1.727 m).   Weight as of this encounter: 197 lb (89.4 kg).  BMI interpretation table: BMI level Category Range association with higher incidence of chronic pain  <18 kg/m2 Underweight   18.5-24.9 kg/m2 Ideal body weight   25-29.9 kg/m2 Overweight Increased incidence by 20%  30-34.9 kg/m2 Obese (Class I) Increased incidence by 68%  35-39.9 kg/m2 Severe obesity (Class II) Increased incidence by 136%  >40 kg/m2 Extreme obesity (Class III) Increased incidence by 254%   BMI Readings from Last 4 Encounters:  11/09/16 29.95 kg/m  10/12/16 32.12 kg/m  10/10/16 31.96 kg/m  09/13/16 30.93 kg/m   Wt Readings from Last 4 Encounters:  11/09/16 197 lb (89.4 kg)  10/12/16 199 lb (90.3 kg)  10/10/16 198 lb (89.8 kg)  09/13/16 203 lb 6.4 oz (92.3 kg)  Psych/Mental status: Alert, oriented x 3 (person, place, & time)       Eyes: PERLA Respiratory: No evidence of acute respiratory distress  Cervical Spine Area Exam  Skin & Axial Inspection: No masses,  redness, edema, swelling, or associated skin lesions Alignment: Symmetrical Functional ROM: Unrestricted ROM      Stability: No instability detected Muscle Tone/Strength: Functionally intact. No obvious neuro-muscular anomalies detected. Sensory (Neurological): Unimpaired Palpation: No palpable anomalies              Upper Extremity (UE) Exam    Side: Right upper extremity  Side: Left upper extremity  Skin & Extremity Inspection: Skin color, temperature, and hair growth are WNL. No peripheral edema or cyanosis. No masses, redness, swelling, asymmetry, or associated skin lesions. No contractures.  Skin & Extremity Inspection: Skin color, temperature, and hair growth are WNL. No peripheral edema or cyanosis. No masses, redness, swelling, asymmetry, or associated skin lesions. No contractures.  Functional ROM: Unrestricted ROM          Functional ROM: Unrestricted ROM          Muscle Tone/Strength: Functionally intact. No obvious neuro-muscular anomalies detected.  Muscle Tone/Strength:  Functionally intact. No obvious neuro-muscular anomalies detected.  Sensory (Neurological): Unimpaired          Sensory (Neurological): Unimpaired          Palpation: No palpable anomalies              Palpation: No palpable anomalies              Specialized Test(s): Deferred         Specialized Test(s): Deferred          Thoracic Spine Area Exam  Skin & Axial Inspection: No masses, redness, or swelling Alignment: Symmetrical Functional ROM: Unrestricted ROM Stability: No instability detected Muscle Tone/Strength: Functionally intact. No obvious neuro-muscular anomalies detected. Sensory (Neurological): Unimpaired Muscle strength & Tone: No palpable anomalies  Lumbar Spine Area Exam  Skin & Axial Inspection: No masses, redness, or swelling Alignment: Symmetrical Functional ROM: Unrestricted ROM      Stability: No instability detected Muscle Tone/Strength: Functionally intact. No obvious neuro-muscular  anomalies detected. Sensory (Neurological): Unimpaired Palpation: No palpable anomalies       Provocative Tests: Lumbar Hyperextension and rotation test: evaluation deferred today       Lumbar Lateral bending test: evaluation deferred today       Patrick's Maneuver: evaluation deferred today                    Gait & Posture Assessment  Ambulation: Unassisted Gait: Relatively normal for age and body habitus Posture: WNL   Lower Extremity Exam    Side: Right lower extremity  Side: Left lower extremity  Skin & Extremity Inspection: Skin color, temperature, and hair growth are WNL. No peripheral edema or cyanosis. No masses, redness, swelling, asymmetry, or associated skin lesions. No contractures.  Skin & Extremity Inspection: Skin color, temperature, and hair growth are WNL. No peripheral edema or cyanosis. No masses, redness, swelling, asymmetry, or associated skin lesions. No contractures.  Functional ROM: Unrestricted ROM          Functional ROM: Unrestricted ROM          Muscle Tone/Strength: Functionally intact. No obvious neuro-muscular anomalies detected.  Muscle Tone/Strength: Functionally intact. No obvious neuro-muscular anomalies detected.  Sensory (Neurological): Unimpaired  Sensory (Neurological): Unimpaired  Palpation: No palpable anomalies  Palpation: No palpable anomalies   Assessment  Primary Diagnosis & Pertinent Problem List: The primary encounter diagnosis was Chronic ankle pain (Primary Area of Pain) (Left). Diagnoses of Unspecified fracture of left talus, sequela (left ankle pain), Chronic lower extremity pain (Left), Chronic bilateral low back pain with left-sided sciatica, Osteoporosis of lumbar spine, Herpes zoster without complication, Neurogenic pain, Long term prescription opiate use, and Opiate use were also pertinent to this visit.  Status Diagnosis  Persistent Stable Unimproved 1. Chronic ankle pain (Primary Area of Pain) (Left)   2. Unspecified fracture of  left talus, sequela (left ankle pain)   3. Chronic lower extremity pain (Left)   4. Chronic bilateral low back pain with left-sided sciatica   5. Osteoporosis of lumbar spine   6. Herpes zoster without complication   7. Neurogenic pain   8. Long term prescription opiate use   9. Opiate use     Problems updated and reviewed during this visit: No problems updated. Plan of Care  Pharmacotherapy (Medications Ordered): Meds ordered this encounter  Medications  . oxyCODONE (OXY IR/ROXICODONE) 5 MG immediate release tablet    Sig: Take 1 tablet (5 mg total) by mouth 2 (two) times daily.  Dispense:  60 tablet    Refill:  0    Do not place this medication, or any other prescription from our practice, on "Automatic Refill". Patient may have prescription filled one day early if pharmacy is closed on scheduled refill date. Do not fill until: 11/09/16 To last until: 12/09/16  . gabapentin (NEURONTIN) 300 MG capsule    Sig: Take 1 capsule (300 mg total) by mouth 3 (three) times daily.    Dispense:  90 capsule    Refill:  0    Do not place medication on "Automatic Refill". Fill one day early if pharmacy is closed on scheduled refill date.   New Prescriptions   No medications on file   Medications administered today: Ms. Sayavong had no medications administered during this visit.   Procedure Orders     Injection of joint  Lab Orders     ToxASSURE Select 13 (MW), Urine  Imaging Orders     DG BONE DENSITY (DXA) Referral Orders  No referral(s) requested today    Interventional management options: Planned, scheduled, and/or pending:   Diagnostic left ankle block under fluoroscopic guidance and IV sedation    Considering:   Diagnostic left lower extremity nerve conduction test  Diagnostic right-sided caudal epidural steroid injection  Diagnostic Left Ankle Block  Possible right sided spinal cord stimulator trial    Palliative PRN treatment(s):   None at this time    Provider-requested follow-up: Return for Procedure (w/ sedation): (L) Ankle BLK (Talus).  Future Appointments Date Time Provider [place]  11/15/2016 10:30 AM Kayla Haddock, NP [facility]   Primary Care Physician: Kayla Haddock, NP Location: [facility] Note by: Kayla Cola, MD Date: 11/09/2016; Time: 3:17 PM

## 2016-11-09 ENCOUNTER — Ambulatory Visit: Payer: BLUE CROSS/BLUE SHIELD | Attending: Pain Medicine | Admitting: Pain Medicine

## 2016-11-09 ENCOUNTER — Encounter: Payer: Self-pay | Admitting: Pain Medicine

## 2016-11-09 VITALS — BP 118/67 | HR 72 | Temp 98.1°F | Resp 16 | Ht 68.0 in | Wt 197.0 lb

## 2016-11-09 DIAGNOSIS — K219 Gastro-esophageal reflux disease without esophagitis: Secondary | ICD-10-CM | POA: Insufficient documentation

## 2016-11-09 DIAGNOSIS — B029 Zoster without complications: Secondary | ICD-10-CM | POA: Diagnosis not present

## 2016-11-09 DIAGNOSIS — M25572 Pain in left ankle and joints of left foot: Secondary | ICD-10-CM | POA: Insufficient documentation

## 2016-11-09 DIAGNOSIS — M4802 Spinal stenosis, cervical region: Secondary | ICD-10-CM | POA: Insufficient documentation

## 2016-11-09 DIAGNOSIS — F1190 Opioid use, unspecified, uncomplicated: Secondary | ICD-10-CM

## 2016-11-09 DIAGNOSIS — Z79899 Other long term (current) drug therapy: Secondary | ICD-10-CM | POA: Insufficient documentation

## 2016-11-09 DIAGNOSIS — G8929 Other chronic pain: Secondary | ICD-10-CM | POA: Diagnosis not present

## 2016-11-09 DIAGNOSIS — M5442 Lumbago with sciatica, left side: Secondary | ICD-10-CM | POA: Diagnosis not present

## 2016-11-09 DIAGNOSIS — E785 Hyperlipidemia, unspecified: Secondary | ICD-10-CM | POA: Insufficient documentation

## 2016-11-09 DIAGNOSIS — X58XXXS Exposure to other specified factors, sequela: Secondary | ICD-10-CM | POA: Diagnosis not present

## 2016-11-09 DIAGNOSIS — E079 Disorder of thyroid, unspecified: Secondary | ICD-10-CM | POA: Insufficient documentation

## 2016-11-09 DIAGNOSIS — M5136 Other intervertebral disc degeneration, lumbar region: Secondary | ICD-10-CM | POA: Insufficient documentation

## 2016-11-09 DIAGNOSIS — Z5181 Encounter for therapeutic drug level monitoring: Secondary | ICD-10-CM | POA: Insufficient documentation

## 2016-11-09 DIAGNOSIS — F419 Anxiety disorder, unspecified: Secondary | ICD-10-CM | POA: Diagnosis not present

## 2016-11-09 DIAGNOSIS — M810 Age-related osteoporosis without current pathological fracture: Secondary | ICD-10-CM | POA: Insufficient documentation

## 2016-11-09 DIAGNOSIS — G894 Chronic pain syndrome: Secondary | ICD-10-CM | POA: Diagnosis present

## 2016-11-09 DIAGNOSIS — E039 Hypothyroidism, unspecified: Secondary | ICD-10-CM | POA: Insufficient documentation

## 2016-11-09 DIAGNOSIS — I10 Essential (primary) hypertension: Secondary | ICD-10-CM | POA: Insufficient documentation

## 2016-11-09 DIAGNOSIS — M792 Neuralgia and neuritis, unspecified: Secondary | ICD-10-CM | POA: Insufficient documentation

## 2016-11-09 DIAGNOSIS — Z79891 Long term (current) use of opiate analgesic: Secondary | ICD-10-CM | POA: Insufficient documentation

## 2016-11-09 DIAGNOSIS — Z6829 Body mass index (BMI) 29.0-29.9, adult: Secondary | ICD-10-CM | POA: Insufficient documentation

## 2016-11-09 DIAGNOSIS — F329 Major depressive disorder, single episode, unspecified: Secondary | ICD-10-CM | POA: Diagnosis not present

## 2016-11-09 DIAGNOSIS — M79605 Pain in left leg: Secondary | ICD-10-CM

## 2016-11-09 DIAGNOSIS — S92102S Unspecified fracture of left talus, sequela: Secondary | ICD-10-CM | POA: Diagnosis not present

## 2016-11-09 DIAGNOSIS — M818 Other osteoporosis without current pathological fracture: Secondary | ICD-10-CM | POA: Diagnosis not present

## 2016-11-09 DIAGNOSIS — J45909 Unspecified asthma, uncomplicated: Secondary | ICD-10-CM | POA: Insufficient documentation

## 2016-11-09 DIAGNOSIS — G4700 Insomnia, unspecified: Secondary | ICD-10-CM | POA: Diagnosis not present

## 2016-11-09 DIAGNOSIS — E669 Obesity, unspecified: Secondary | ICD-10-CM | POA: Insufficient documentation

## 2016-11-09 MED ORDER — OXYCODONE HCL 5 MG PO TABS: 5.0000 mg | ORAL_TABLET | Freq: Two times a day (BID) | ORAL | 0 refills | Status: DC

## 2016-11-09 MED ORDER — GABAPENTIN 300 MG PO CAPS: 300.0000 mg | ORAL_CAPSULE | Freq: Three times a day (TID) | ORAL | 0 refills | Status: DC

## 2016-11-09 NOTE — Progress Notes (Signed)
Nursing Pain Medication Assessment:  Safety precautions to be maintained throughout the outpatient stay will include: orient to surroundings, keep bed in low position, maintain call bell within reach at all times, provide assistance with transfer out of bed and ambulation.  Medication Inspection Compliance: Pill count conducted under aseptic conditions, in front of the patient. Neither the pills nor the bottle was removed from the patient's sight at any time. Once count was completed pills were immediately returned to the patient in their original bottle.  Medication: Oxycodone IR Pill/Patch Count: 2 of 120 pills remain Pill/Patch Appearance: Markings consistent with prescribed medication Bottle Appearance: Standard pharmacy container. Clearly labeled. Filled Date: 09 / 17 / 2018 Last Medication intake:  Today

## 2016-11-09 NOTE — Progress Notes (Deleted)
Safety precautions to be maintained throughout the outpatient stay will include: orient to surroundings, keep bed in low position, maintain call bell within reach at all times, provide assistance with transfer out of bed and ambulation.  

## 2016-11-09 NOTE — Patient Instructions (Addendum)
____________________________________________________________________________________________  Preparing for Procedure with Sedation Instructions: . Oral Intake: Do not eat or drink anything for at least 8 hours prior to your procedure. . Transportation: Public transportation is not allowed. Bring an adult driver. The driver must be physically present in our waiting room before any procedure can be started. Marland Kitchen Physical Assistance: Bring an adult physically capable of assisting you, in the event you need help. This adult should keep you company at home for at least 6 hours after the procedure. . Blood Pressure Medicine: Take your blood pressure medicine with a sip of water the morning of the procedure. . Blood thinners:  . Diabetics on insulin: Notify the staff so that you can be scheduled 1st case in the morning. If your diabetes requires high dose insulin, take only  of your normal insulin dose the morning of the procedure and notify the staff that you have done so. . Preventing infections: Shower with an antibacterial soap the morning of your procedure. . Build-up your immune system: Take 1000 mg of Vitamin C with every meal (3 times a day) the day prior to your procedure. Marland Kitchen Antibiotics: Inform the staff if you have a condition or reason that requires you to take antibiotics before dental procedures. . Pregnancy: If you are pregnant, call and cancel the procedure. . Sickness: If you have a cold, fever, or any active infections, call and cancel the procedure. . Arrival: You must be in the facility at least 30 minutes prior to your scheduled procedure. . Children: Do not bring children with you. . Dress appropriately: Bring dark clothing that you would not mind if they get stained. . Valuables: Do not bring any jewelry or valuables. Procedure appointments are reserved for interventional treatments only. Marland Kitchen No Prescription Refills. . No medication changes will be discussed during procedure  appointments. . No disability issues will be discussed. ____________________________________________________________________________________________  ____________________________________________________________________________________________  Pain Scale  Introduction: The pain score used by this practice is the Verbal Numerical Rating Scale (VNRS-11). This is an 11-point scale. It is for adults and children 10 years or older. There are significant differences in how the pain score is reported, used, and applied. Forget everything you learned in the past and learn this scoring system.  General Information: The scale should reflect your current level of pain. Unless you are specifically asked for the level of your worst pain, or your average pain. If you are asked for one of these two, then it should be understood that it is over the past 24 hours.  Basic Activities of Daily Living (ADL): Personal hygiene, dressing, eating, transferring, and using restroom.  Instructions: Most patients tend to report their level of pain as a combination of two factors, their physical pain and their psychosocial pain. This last one is also known as "suffering" and it is reflection of how physical pain affects you socially and psychologically. From now on, report them separately. From this point on, when asked to report your pain level, report only your physical pain. Use the following table for reference.  Pain Clinic Pain Levels (0-5/10)  Pain Level Score  Description  No Pain 0   Mild pain 1 Nagging, annoying, but does not interfere with basic activities of daily living (ADL). Patients are able to eat, bathe, get dressed, toileting (being able to get on and off the toilet and perform personal hygiene functions), transfer (move in and out of bed or a chair without assistance), and maintain continence (able to control bladder and bowel  functions). Blood pressure and heart rate are unaffected. A normal heart rate for  a healthy adult ranges from 60 to 100 bpm (beats per minute).   Mild to moderate pain 2 Noticeable and distracting. Impossible to hide from other people. More frequent flare-ups. Still possible to adapt and function close to normal. It can be very annoying and may have occasional stronger flare-ups. With discipline, patients may get used to it and adapt.   Moderate pain 3 Interferes significantly with activities of daily living (ADL). It becomes difficult to feed, bathe, get dressed, get on and off the toilet or to perform personal hygiene functions. Difficult to get in and out of bed or a chair without assistance. Very distracting. With effort, it can be ignored when deeply involved in activities.   Moderately severe pain 4 Impossible to ignore for more than a few minutes. With effort, patients may still be able to manage work or participate in some social activities. Very difficult to concentrate. Signs of autonomic nervous system discharge are evident: dilated pupils (mydriasis); mild sweating (diaphoresis); sleep interference. Heart rate becomes elevated (>115 bpm). Diastolic blood pressure (lower number) rises above 100 mmHg. Patients find relief in laying down and not moving.   Severe pain 5 Intense and extremely unpleasant. Associated with frowning face and frequent crying. Pain overwhelms the senses.  Ability to do any activity or maintain social relationships becomes significantly limited. Conversation becomes difficult. Pacing back and forth is common, as getting into a comfortable position is nearly impossible. Pain wakes you up from deep sleep. Physical signs will be obvious: pupillary dilation; increased sweating; goosebumps; brisk reflexes; cold, clammy hands and feet; nausea, vomiting or dry heaves; loss of appetite; significant sleep disturbance with inability to fall asleep or to remain asleep. When persistent, significant weight loss is observed due to the complete loss of appetite and  sleep deprivation.  Blood pressure and heart rate becomes significantly elevated. Caution: If elevated blood pressure triggers a pounding headache associated with blurred vision, then the patient should immediately seek attention at an urgent or emergency care unit, as these may be signs of an impending stroke.    Emergency Department Pain Levels (6-10/10)  Emergency Room Pain 6 Severely limiting. Requires emergency care and should not be seen or managed at an outpatient pain management facility. Communication becomes difficult and requires great effort. Assistance to reach the emergency department may be required. Facial flushing and profuse sweating along with potentially dangerous increases in heart rate and blood pressure will be evident.   Distressing pain 7 Self-care is very difficult. Assistance is required to transport, or use restroom. Assistance to reach the emergency department will be required. Tasks requiring coordination, such as bathing and getting dressed become very difficult.   Disabling pain 8 Self-care is no longer possible. At this level, pain is disabling. The individual is unable to do even the most "basic" activities such as walking, eating, bathing, dressing, transferring to a bed, or toileting. Fine motor skills are lost. It is difficult to think clearly.   Incapacitating pain 9 Pain becomes incapacitating. Thought processing is no longer possible. Difficult to remember your own name. Control of movement and coordination are lost.   The worst pain imaginable 10 At this level, most patients pass out from pain. When this level is reached, collapse of the autonomic nervous system occurs, leading to a sudden drop in blood pressure and heart rate. This in turn results in a temporary and dramatic drop in blood flow  to the brain, leading to a loss of consciousness. Fainting is one of the body's self defense mechanisms. Passing out puts the brain in a calmed state and causes it to shut  down for a while, in order to begin the healing process.    Summary: 1. Refer to this scale when providing [place] with your pain level. 2. Be accurate and careful when reporting your pain level. This will help with your care. 3. Over-reporting your pain level will lead to loss of credibility. 4. Even a level of 1/10 means that there is pain and will be treated at our facility. 5. High, inaccurate reporting will be documented as "Symptom Exaggeration", leading to loss of credibility and suspicions of possible secondary gains such as obtaining more narcotics, or wanting to appear disabled, for fraudulent reasons. 6. Only pain levels of 5 or below will be seen at our facility. 7. Pain levels of 6 and above will be sent to the Emergency Department and the appointment cancelled. ____________________________________________________________________________________________  Pain Management Discharge Instructions  General Discharge Instructions :  If you need to reach your doctor call: Monday-Friday 8:00 am - 4:00 pm at [phone] or toll free [phone].  After clinic hours [phone] to have operator reach doctor.  Bring all of your medication bottles to all your appointments in the pain clinic.  To cancel or reschedule your appointment with Pain Management please remember to call 24 hours in advance to avoid a fee.  Refer to the educational materials which you have been given on: General Risks, I had my Procedure. Discharge Instructions, Post Sedation.  Post Procedure Instructions:  The drugs you were given will stay in your system until tomorrow, so for the next 24 hours you should not drive, make any legal decisions or drink any alcoholic beverages.  You may eat anything you prefer, but it is better to start with liquids then soups and crackers, and gradually work up to solid foods.  Please notify your doctor immediately if you have any unusual bleeding, trouble breathing or pain that  is not related to your normal pain.  Depending on the type of procedure that was done, some parts of your body may feel week and/or numb.  This usually clears up by tonight or the next day.  Walk with the use of an assistive device or accompanied by an adult for the 24 hours.  You may use ice on the affected area for the first 24 hours.  Put ice in a Ziploc bag and cover with a towel and place against area 15 minutes on 15 minutes off.  You may switch to heat after 24 hours.Pain Management Discharge Instructions  General Discharge Instructions :  If you need to reach your doctor call: Monday-Friday 8:00 am - 4:00 pm at [phone] or toll free [phone].  After clinic hours [phone] to have operator reach doctor.  Bring all of your medication bottles to all your appointments in the pain clinic.  To cancel or reschedule your appointment with Pain Management please remember to call 24 hours in advance to avoid a fee.  Refer to the educational materials which you have been given on: General Risks, I had my Procedure. Discharge Instructions, Post Sedation.  Post Procedure Instructions:  The drugs you were given will stay in your system until tomorrow, so for the next 24 hours you should not drive, make any legal decisions or drink any alcoholic beverages.  You may eat anything you prefer, but it is better to start  with liquids then soups and crackers, and gradually work up to solid foods.  Please notify your doctor immediately if you have any unusual bleeding, trouble breathing or pain that is not related to your normal pain.  Depending on the type of procedure that was done, some parts of your body may feel week and/or numb.  This usually clears up by tonight or the next day.  Walk with the use of an assistive device or accompanied by an adult for the 24 hours.  You may use ice on the affected area for the first 24 hours.  Put ice in a Ziploc bag and cover with a towel and place  against area 15 minutes on 15 minutes off.  You may switch to heat after 24 hours.

## 2016-11-15 ENCOUNTER — Ambulatory Visit (INDEPENDENT_AMBULATORY_CARE_PROVIDER_SITE_OTHER): Payer: BLUE CROSS/BLUE SHIELD | Admitting: Unknown Physician Specialty

## 2016-11-15 ENCOUNTER — Encounter: Payer: Self-pay | Admitting: Unknown Physician Specialty

## 2016-11-15 VITALS — BP 112/68 | HR 75 | Temp 98.5°F | Wt 200.4 lb

## 2016-11-15 DIAGNOSIS — Z23 Encounter for immunization: Secondary | ICD-10-CM | POA: Diagnosis not present

## 2016-11-15 DIAGNOSIS — F322 Major depressive disorder, single episode, severe without psychotic features: Secondary | ICD-10-CM | POA: Diagnosis not present

## 2016-11-15 MED ORDER — QUETIAPINE FUMARATE 50 MG PO TABS: 150.0000 mg | ORAL_TABLET | Freq: Every day | ORAL | 1 refills | Status: DC

## 2016-11-15 NOTE — Assessment & Plan Note (Addendum)
Worsening, no help with Buproprion.  No suicidal plans or methods. DC Buproprion for no effect.  Continue with Fluoxetine and counseling.  Start Seroquel QHS.

## 2016-11-15 NOTE — Patient Instructions (Signed)

## 2016-11-15 NOTE — Progress Notes (Signed)
BP 112/68   Pulse 75   Temp 98.5 F (36.9 C)   Wt 200 lb 6.4 oz (90.9 kg)   SpO2 97%   BMI 30.47 kg/m    Subjective:    Patient ID: Kayla Wright, female    DOB: November 02, 1955, 61 y.o.   MRN: [id number]  HPI: Kayla Wright is a 61 y.o. female  Chief Complaint  Patient presents with  . Depression    4 week f/up   Depression Last visit, in addition to her fluoxetine, she had Buproprion added in the morning.  Pt states she is still depressed.  Thinks about hurting herself but has hope for the future with children and grandchildren. She is getting counseling through pastor.  She does have a good relationship with her sister.  She is not back at work and getting disability.  Living with her boyfriend and says her relationship is good.  Depression screen [facility] 2/9 11/15/2016 11/09/2016 10/10/2016 09/13/2016 08/29/2016  Decreased Interest 2 0 (No Data) 2 0  Down, Depressed, Hopeless 2 0 - 1 0  PHQ - 2 Score 4 0 - 3 0  Altered sleeping 1 - - 2 -  Tired, decreased energy 1 - - 1 -  Change in appetite 2 - - 2 -  Feeling bad or failure about yourself  2 - - 1 -  Trouble concentrating 0 - - 1 -  Moving slowly or fidgety/restless 0 - - 2 -  Suicidal thoughts 2 - - 2 -  PHQ-9 Score 12 - - 14 -    Relevant past medical, surgical, family and social history reviewed and updated as indicated. Interim medical history since our last visit reviewed. Allergies and medications reviewed and updated.  Review of Systems  Per HPI unless specifically indicated above     Objective:    BP 112/68   Pulse 75   Temp 98.5 F (36.9 C)   Wt 200 lb 6.4 oz (90.9 kg)   SpO2 97%   BMI 30.47 kg/m   Wt Readings from Last 3 Encounters:  11/15/16 200 lb 6.4 oz (90.9 kg)  11/09/16 197 lb (89.4 kg)  10/12/16 199 lb (90.3 kg)    Physical Exam  Constitutional: She is oriented to person, place, and time. She appears well-developed and well-nourished. No distress.  HENT:  Head: Normocephalic and atraumatic.    Eyes: Conjunctivae and lids are normal. Right eye exhibits no discharge. Left eye exhibits no discharge. No scleral icterus.  Neck: Normal range of motion. Neck supple. No JVD present. Carotid bruit is not present.  Cardiovascular: Normal rate, regular rhythm and normal heart sounds.   Pulmonary/Chest: Effort normal and breath sounds normal.  Abdominal: Normal appearance. There is no splenomegaly or hepatomegaly.  Musculoskeletal: Normal range of motion.  Neurological: She is alert and oriented to person, place, and time.  Skin: Skin is warm, dry and intact. No rash noted. No pallor.  Psychiatric: She has a normal mood and affect. Her behavior is normal. Judgment and thought content normal.    Results for orders placed or performed in visit on 08/29/16  Compliance Drug Analysis, Ur  Result Value Ref Range   Summary FINAL   C-reactive protein  Result Value Ref Range   CRP 1.6 0.0 - 4.9 mg/L  Sedimentation rate  Result Value Ref Range   Sed Rate 7 0 - 40 mm/hr  Magnesium  Result Value Ref Range   Magnesium 2.9 (H) 1.6 - 2.3 mg/dL  Vitamin B12  Result Value Ref Range   Vitamin B-12 1,284 (H) 232 - 1,245 pg/mL      Assessment & Plan:   Problem List Items Addressed This Visit      Unprioritized   Major depressive disorder, single episode, unspecified    Worsening, no help with Buproprion.  No suicidal plans or methods. DC Buproprion for no effect.  Continue with Fluoxetine and counseling.  Start Seroquel QHS.        Other Visit Diagnoses    Need for influenza vaccination    -  Primary   Relevant Orders   Flu Vaccine QUAD 36+ mos IM (Completed)       Follow up plan: Return in about 4 weeks (around 12/13/2016).

## 2016-11-16 LAB — TOXASSURE SELECT 13 (MW), URINE

## 2016-11-23 ENCOUNTER — Other Ambulatory Visit: Payer: Self-pay | Admitting: Unknown Physician Specialty

## 2016-12-06 ENCOUNTER — Encounter: Payer: Self-pay | Admitting: Pain Medicine

## 2016-12-06 ENCOUNTER — Other Ambulatory Visit: Payer: Self-pay

## 2016-12-06 ENCOUNTER — Ambulatory Visit: Payer: BLUE CROSS/BLUE SHIELD | Attending: Nurse Practitioner | Admitting: Nurse Practitioner

## 2016-12-06 ENCOUNTER — Encounter: Payer: Self-pay | Admitting: Nurse Practitioner

## 2016-12-06 VITALS — BP 116/70 | HR 72 | Temp 98.5°F | Resp 16 | Ht 68.0 in | Wt 197.0 lb

## 2016-12-06 DIAGNOSIS — M5442 Lumbago with sciatica, left side: Secondary | ICD-10-CM | POA: Diagnosis not present

## 2016-12-06 DIAGNOSIS — M79605 Pain in left leg: Secondary | ICD-10-CM

## 2016-12-06 DIAGNOSIS — M25572 Pain in left ankle and joints of left foot: Secondary | ICD-10-CM | POA: Insufficient documentation

## 2016-12-06 DIAGNOSIS — Z9114 Patient's other noncompliance with medication regimen: Secondary | ICD-10-CM

## 2016-12-06 DIAGNOSIS — E785 Hyperlipidemia, unspecified: Secondary | ICD-10-CM | POA: Diagnosis not present

## 2016-12-06 DIAGNOSIS — F1490 Cocaine use, unspecified, uncomplicated: Secondary | ICD-10-CM

## 2016-12-06 DIAGNOSIS — G4730 Sleep apnea, unspecified: Secondary | ICD-10-CM | POA: Diagnosis not present

## 2016-12-06 DIAGNOSIS — F329 Major depressive disorder, single episode, unspecified: Secondary | ICD-10-CM | POA: Insufficient documentation

## 2016-12-06 DIAGNOSIS — G894 Chronic pain syndrome: Secondary | ICD-10-CM | POA: Diagnosis not present

## 2016-12-06 DIAGNOSIS — Z79899 Other long term (current) drug therapy: Secondary | ICD-10-CM | POA: Insufficient documentation

## 2016-12-06 DIAGNOSIS — Z79891 Long term (current) use of opiate analgesic: Secondary | ICD-10-CM | POA: Insufficient documentation

## 2016-12-06 DIAGNOSIS — M792 Neuralgia and neuritis, unspecified: Secondary | ICD-10-CM

## 2016-12-06 DIAGNOSIS — F1491 Cocaine use, unspecified, in remission: Secondary | ICD-10-CM | POA: Insufficient documentation

## 2016-12-06 DIAGNOSIS — I10 Essential (primary) hypertension: Secondary | ICD-10-CM | POA: Insufficient documentation

## 2016-12-06 DIAGNOSIS — Z5181 Encounter for therapeutic drug level monitoring: Secondary | ICD-10-CM | POA: Diagnosis present

## 2016-12-06 DIAGNOSIS — F419 Anxiety disorder, unspecified: Secondary | ICD-10-CM | POA: Diagnosis not present

## 2016-12-06 DIAGNOSIS — G8929 Other chronic pain: Secondary | ICD-10-CM

## 2016-12-06 DIAGNOSIS — K219 Gastro-esophageal reflux disease without esophagitis: Secondary | ICD-10-CM | POA: Diagnosis not present

## 2016-12-06 NOTE — Patient Instructions (Addendum)

## 2016-12-06 NOTE — Progress Notes (Signed)
Patient's Name: Kayla Wright  MRN: [id number]  Referring Provider: Kathrine Haddock, NP  DOB: 1955/06/24  PCP: Kathrine Haddock, NP  DOS: 12/06/2016  Note by: Vevelyn Francois NP  Service setting: Ambulatory outpatient  Specialty: Interventional Pain Management  Location: [facility] (AMB) Pain Management Facility    Patient type: Established    Primary Reason(s) for Visit: Encounter for prescription drug management. (Level of risk: moderate)  CC: Ankle Pain (left)  HPI  [name] is a 61 y.o. year old, female patient, who comes today for a medication management evaluation. She has Sleep apnea; Severe depression ([place]); Chronic anxiety; Hyperlipidemia, unspecified; Hypothyroidism, unspecified; Insomnia; Allergic rhinitis; Obesity (BMI 30-39.9); Asthma; Anxiety disorder, unspecified; Major depressive disorder, single episode, unspecified; Murmur; SOB (shortness of breath) on exertion; Chronic lower extremity pain (Left); Chronic pain syndrome; Long term current use of opiate analgesic; Long term prescription opiate use; Opiate use; Chronic ankle pain (Primary Area of Pain) (Left); Long term prescription benzodiazepine use; Shingles rash (Secondary Area of Pain) (Right) (Buttocks); History of ankle fracture (Left); Acute Herpes zoster without complication; DDD (degenerative disc disease), cervical (C5-6 and C6-7); Chronic Cervical foraminal stenosis (C3-4) (Right); Osteopenia of lumbar spine; DDD (degenerative disc disease), lumbar (L1-2 and L5-S1); Osteoporosis of lumbar spine; Unspecified fracture of left talus, sequela (left ankle pain); Neurogenic pain; Chronic bilateral low back pain with left-sided sciatica; Cocaine use; and Pain medication agreement broken on their problem list. Her primarily concern today is the Ankle Pain (left)  Pain Assessment: Location: Left Ankle Radiating:   Onset: More than a month ago Duration: Chronic pain Quality: Aching(stinging) Severity: 7 /10 (self-reported pain score)   Note: Reported level is compatible with observation. Clinically the patient looks like a 3/10       Information on the proper use of the pain scale provided to the patient today. When using our objective Pain Scale, levels between 6 and 10/10 are said to belong in an emergency room, as it progressively worsens from a 6/10, described as severely limiting, requiring emergency care not usually available at an outpatient pain management facility. At a 6/10 level, communication becomes difficult and requires great effort. Assistance to reach the emergency department may be required. Facial flushing and profuse sweating along with potentially dangerous increases in heart rate and blood pressure will be evident. Effect on ADL: cant do what I want Timing: Constant Modifying factors: oxycodone  [name] was last scheduled for an appointment on 08/29/2016 for medication management. During today's appointment we reviewed [name]'s chronic pain status, as well as her outpatient medication regimen. She states that the pain is about the same. She states that it depends on her activity. She admits that she can not sit around all day long. She denies having an appointment for left ankle nerve block. She does have some numbness, tingling along with weakness. She has been using the Oxycodone for her pain.  She denies any side effects of her medication. She admits that she has regular bowel movements. She denies any concerns today. She states that she was instructed that if she discontinued the use of the Lorazepam then he would increase her Oxycodone back to 43m TID.    The patient  reports that she does not use drugs. Her body mass index is 29.95 kg/m.  Further details on both, my assessment(s), as well as the proposed treatment plan, please see below.  Controlled Substance Pharmacotherapy Assessment REMS (Risk Evaluation and Mitigation Strategy)  Analgesic: oxycodone IR 5 mg 1  tablet by mouth 4 times a day  (50m/day) (30MME/day  NMorley Kos RN  12/06/2016  1:25 PM  Sign at close encounter Nursing Pain Medication Assessment:  Safety precautions to be maintained throughout the outpatient stay will include: orient to surroundings, keep bed in low position, maintain call bell within reach at all times, provide assistance with transfer out of bed and ambulation.  Medication Inspection Compliance: Pill count conducted under aseptic conditions, in front of the patient. Neither the pills nor the bottle was removed from the patient's sight at any time. Once count was completed pills were immediately returned to the patient in their original bottle.  Medication: Oxycodone IR Pill/Patch Count: 3 of 60 pills remain Pill/Patch Appearance: Markings consistent with prescribed medication Bottle Appearance: Standard pharmacy container. Clearly labeled. Filled Date:10 / 17 / 2018 Last Medication intake:  Today   Pharmacokinetics: Liberation and absorption (onset of action): WNL Distribution (time to peak effect): WNL Metabolism and excretion (duration of action): WNL         Pharmacodynamics: Desired effects: Analgesia: Ms. SKasparekreports >50% benefit. Functional ability: Patient reports that medication allows her to accomplish basic ADLs Clinically meaningful improvement in function (CMIF): Sustained CMIF goals met Perceived effectiveness: Described as relatively effective, allowing for increase in activities of daily living (ADL) Undesirable effects: Side-effects or Adverse reactions: None reported Monitoring: [place] PMP: Online review of the past 156-montheriod conducted. Compliant with practice rules and regulations Last UDS on record: Summary  Date Value Ref Range Status  11/09/2016 FINAL  Final    Comment:    ==================================================================== TOXASSURE SELECT 13 (MW) ==================================================================== Test                              Result       Flag       Units Drug Present and Declared for Prescription Verification   Oxycodone                      1551         EXPECTED   ng/mg creat   Oxymorphone                    43           EXPECTED   ng/mg creat   Noroxycodone                   1824         EXPECTED   ng/mg creat    Sources of oxycodone include scheduled prescription medications.    Oxymorphone and noroxycodone are expected metabolites of    oxycodone. Oxymorphone is also available as a scheduled    prescription medication. Drug Present not Declared for Prescription Verification   Benzoylecgonine                35           UNEXPECTED ng/mg creat    Benzoylecgonine is a metabolite of cocaine; its presence    indicates use of this drug.  Source is most commonly illicit, but    cocaine is present in some topical anesthetic solutions. Drug Absent but Declared for Prescription Verification   Lorazepam                      Not Detected UNEXPECTED ng/mg creat   Clonazepam  Not Detected UNEXPECTED ng/mg creat ==================================================================== Test                      Result    Flag   Units      Ref Range   Creatinine              173              mg/dL      >=20 ==================================================================== Declared Medications:  The flagging and interpretation on this report are based on the  following declared medications.  Unexpected results may arise from  inaccuracies in the declared medications.  **Note: The testing scope of this panel includes these medications:  Clonazepam (Klonopin)  Lorazepam  Oxycodone  **Note: The testing scope of this panel does not include following  reported medications:  Albuterol  Aripiprazole  Budenoside (Symbicort)  Bupropion (Wellbutrin)  Cetirizine (Zyrtec)  Cyanocobalamin  Cyclobenzaprine (Flexeril)  Duloxetine (Cymbalta)  Fluoxetine (Prozac)  Fluticasone (Flonase)  Formoterol  (Symbicort)  Gabapentin  Levothyroxine  Meloxicam  Omeprazole (Prilosec)  Simvastatin  Sumatriptan  Vitamin C  Vitamin D3 ==================================================================== For clinical consultation, please call [phone]. ====================================================================    UDS interpretation: Non-Compliant Undeclared illicit substance detected Medication Assessment Form: Reviewed. Abnormalities discussed Treatment compliance: Non-compliant Risk Assessment Profile: Aberrant behavior: use of illicit substances and See prior evaluations. None observed or detected today Comorbid factors increasing risk of overdose: See prior notes. No additional risks detected today Risk of substance use disorder (SUD): Very High Opioid Risk Tool - 12/06/16 1317      Family History of Substance Abuse   Alcohol  Negative    Illegal Drugs  Negative    Rx Drugs  Negative      Personal History of Substance Abuse   Alcohol  Negative    Illegal Drugs  Negative    Rx Drugs  Negative      Age   Age between 44-45 years   No      History of Preadolescent Sexual Abuse   History of Preadolescent Sexual Abuse  Negative or Female      Psychological Disease   Psychological Disease  Negative    Depression  Negative      Total Score   Opioid Risk Tool Scoring  0    Opioid Risk Interpretation  Low Risk      ORT Scoring interpretation table:  Score <3 = Low Risk for SUD  Score between 4-7 = Moderate Risk for SUD  Score >8 = High Risk for Opioid Abuse   Risk Mitigation Strategies:  Patient Counseling: Covered Patient-Prescriber Agreement (PPA): Present and active  Notification to other healthcare providers: Done  Pharmacologic Plan: No change in therapy, at this time  Laboratory Chemistry  Inflammation Markers (CRP: Acute Phase) (ESR: Chronic Phase) Lab Results  Component Value Date   CRP 1.6 08/29/2016   ESRSEDRATE 7 08/29/2016                  Renal Function Markers Lab Results  Component Value Date   BUN 13 08/09/2016   CREATININE 0.97 08/09/2016   GFRAA 73 08/09/2016   GFRNONAA 64 08/09/2016                 Hepatic Function Markers Lab Results  Component Value Date   AST 15 08/09/2016   ALT 15 08/09/2016   ALBUMIN 4.4 08/09/2016   ALKPHOS 96 08/09/2016  Electrolytes Lab Results  Component Value Date   NA 143 08/09/2016   K 4.1 08/09/2016   CL 105 08/09/2016   CALCIUM 9.2 08/09/2016   MG 2.9 (H) 08/29/2016                 Neuropathy Markers Lab Results  Component Value Date   VITAMINB12 1,284 (H) 08/29/2016                 Bone Pathology Markers Lab Results  Component Value Date   ALKPHOS 96 08/09/2016   VD25OH 39.5 01/27/2016   CALCIUM 9.2 08/09/2016                 Rheumatology Markers No results found for: [facility]              Coagulation Parameters Lab Results  Component Value Date   PLT 262 08/09/2016                 Cardiovascular Markers Lab Results  Component Value Date   HGB 12.9 08/09/2016   HCT 40.4 08/09/2016                 CA Markers No results found for: CEA, CA125, LABCA2               Note: Lab results reviewed.  Recent Diagnostic Imaging Results  MM DIGITAL SCREENING BILATERAL CLINICAL DATA:  Screening.  EXAM: DIGITAL SCREENING BILATERAL MAMMOGRAM WITH CAD  COMPARISON:  Previous exam(s).  ACR Breast Density Category b: There are scattered areas of fibroglandular density.  FINDINGS: There are no findings suspicious for malignancy. Images were processed with CAD.  IMPRESSION: No mammographic evidence of malignancy. A result letter of this screening mammogram will be mailed directly to the patient.  RECOMMENDATION: Screening mammogram in one year. (Code:[id number])  BI-RADS CATEGORY  1: Negative.  Electronically Signed   By: Pamelia Hoit M.D.   On: 08/24/2016 13:15  Complexity Note: Imaging results reviewed. Results shared with Ms.  Jenne Campus, using Layman's terms.                         Meds   Current Outpatient Medications:  .  albuterol (PROVENTIL HFA;VENTOLIN HFA) 108 (90 Base) MCG/ACT inhaler, Inhale 2 puffs into the lungs every 6 (six) hours as needed for wheezing or shortness of breath., Disp: 1 Inhaler, Rfl: 2 .  ARIPiprazole (ABILIFY) 10 MG tablet, 10 mg daily. , Disp: , Rfl:  .  Ascorbic Acid (VITAMIN C) 1000 MG tablet, Take 1,000 mg by mouth daily., Disp: , Rfl:  .  Cholecalciferol (VITAMIN D3) 5000 units CAPS, Take 5,000 Units by mouth daily., Disp: , Rfl:  .  Cyanocobalamin (VITAMIN B-12 PO), Take 1 tablet by mouth daily., Disp: , Rfl:  .  diclofenac sodium (VOLTAREN) 1 % GEL, Apply 1 application topically daily., Disp: , Rfl: 2 .  FLUoxetine (PROZAC) 40 MG capsule, Take 1 capsule by mouth daily., Disp: , Rfl: 0 .  levothyroxine (SYNTHROID, LEVOTHROID) 50 MCG tablet, Take 1 tablet (50 mcg total) by mouth daily before breakfast., Disp: 90 tablet, Rfl: 3 .  oxyCODONE (OXY IR/ROXICODONE) 5 MG immediate release tablet, Take 1 tablet (5 mg total) by mouth 2 (two) times daily., Disp: 60 tablet, Rfl: 0 .  simvastatin (ZOCOR) 40 MG tablet, TAKE 1 TABLET BY MOUTH ONCE DAILY, Disp: 90 tablet, Rfl: 1 .  SUMAtriptan (IMITREX) 50 MG tablet, Take 50 mg by mouth every 2 (two)  hours as needed for migraine. May repeat in 2 hours if headache persists or recurs., Disp: , Rfl:   ROS  Constitutional: Denies any fever or chills Gastrointestinal: No reported hemesis, hematochezia, vomiting, or acute GI distress Musculoskeletal: Denies any acute onset joint swelling, redness, loss of ROM, or weakness Neurological: No reported episodes of acute onset apraxia, aphasia, dysarthria, agnosia, amnesia, paralysis, loss of coordination, or loss of consciousness  Allergies  Ms. Khouri has No Known Allergies.  PFSH  Drug: Ms. Briones  reports that she does not use drugs. Alcohol:  reports that she does not drink alcohol. Tobacco:  reports  that  has never smoked. she has never used smokeless tobacco. Medical:  has a past medical history of Allergy, Common migraine, Depression, GERD (gastroesophageal reflux disease), Hyperlipidemia, Hypertension, Insomnia, Menopausal symptom, Osteopenia, and Thyroid disease. Surgical: Ms. Madril  has a past surgical history that includes Tubal ligation and Ankle fracture surgery (06/18/15). Family: family history includes Cirrhosis in her mother; Diabetes in her mother; Heart disease in her father and mother.  Constitutional Exam  General appearance: Well nourished, well developed, and well hydrated. In no apparent acute distress Vitals:   12/06/16 1312  BP: 116/70  Pulse: 72  Resp: 16  Temp: 98.5 F (36.9 C)  TempSrc: Oral  SpO2: 97%  Weight: 197 lb (89.4 kg)  Height: 5' 8" (1.727 m)   BMI Assessment: Estimated body mass index is 29.95 kg/m as calculated from the following:   Height as of this encounter: 5' 8" (1.727 m).   Weight as of this encounter: 197 lb (89.4 kg).  Psych/Mental status: Alert, oriented x 3 (person, place, & time)       Eyes: PERLA Respiratory: No evidence of acute respiratory distress  Lumbar Spine Area Exam  Skin & Axial Inspection: No masses, redness, or swelling Alignment: Symmetrical Functional ROM: Unrestricted ROM      Stability: No instability detected Muscle Tone/Strength: Functionally intact. No obvious neuro-muscular anomalies detected. Sensory (Neurological): Unimpaired Palpation: No palpable anomalies       Provocative Tests: Lumbar Hyperextension and rotation test: evaluation deferred today       Lumbar Lateral bending test: evaluation deferred today       Patrick's Maneuver: evaluation deferred today                    Gait & Posture Assessment  Ambulation: Patient ambulates using a cane Gait: Relatively normal for age and body habitus Posture: WNL   Lower Extremity Exam    Side: Right lower extremity  Side: Left lower extremity  Skin &  Extremity Inspection: Skin color, temperature, and hair growth are WNL. No peripheral edema or cyanosis. No masses, redness, swelling, asymmetry, or associated skin lesions. No contractures.  Skin & Extremity Inspection: Brace patent  Functional ROM: Unrestricted ROM          Functional ROM: Unrestricted ROM          Muscle Tone/Strength: Functionally intact. No obvious neuro-muscular anomalies detected.  Muscle Tone/Strength: Functionally intact. No obvious neuro-muscular anomalies detected.  Sensory (Neurological): Unimpaired  Sensory (Neurological): Unimpaired  Palpation: No palpable anomalies  Palpation: No palpable anomalies   Assessment  Primary Diagnosis & Pertinent Problem List: The primary encounter diagnosis was Chronic ankle pain (Primary Area of Pain) (Left). Diagnoses of Chronic bilateral low back pain with left-sided sciatica, Chronic lower extremity pain (Left), Neurogenic pain, and Chronic pain syndrome were also pertinent to this visit.  Status Diagnosis  Persistent Controlled  Controlled 1. Chronic ankle pain (Primary Area of Pain) (Left)   2. Chronic bilateral low back pain with left-sided sciatica   3. Chronic lower extremity pain (Left)   4. Neurogenic pain   5. Chronic pain syndrome     Problems updated and reviewed during this visit: No problems updated. Plan of Care  Pharmacotherapy (Medications Ordered): No orders of the defined types were placed in this encounter. This SmartLink is deprecated. Use AVSMEDLIST instead to display the medication list for a patient. Medications administered today: Myer Peer had no medications administered during this visit. Lab-work, procedure(s), and/or referral(s): No orders of the defined types were placed in this encounter.  Imaging and/or referral(s): None  Interventional management options: Planned, scheduled, and/or pending:   Diagnostic left ankle block under fluoroscopic guidance and IV sedation pending if patient  desires.  No additional narcotics to be given secondary to UDS  Encouraged pt to follow up with PCP    Considering:   Diagnostic left lower extremity nerve conduction test Diagnostic right-sided caudal epidural steroid injection Diagnostic Left Ankle Block Possible right sided spinal cord stimulator trial   Palliative PRN treatment(s):   None at this time   Provider-requested follow-up: Return in about 4 weeks (around 01/03/2017) for MedMgmt.  Future Appointments  Date Time Provider [place]  12/07/2016  1:45 PM Kathrine Haddock, NP [facility]  12/13/2016  9:15 AM Kathrine Haddock, NP [facility] [place]   Primary Care Physician: Kathrine Haddock, NP Location: [facility] Out[facility] Management [facility] Note by: Vevelyn Francois NP Date: 12/06/2016; Time: 4:03 PM  Pain Score Disclaimer: We use the NRS-11 scale. This is a self-reported, subjective measurement of pain severity with only modest accuracy. It is used primarily to identify changes within a particular patient. It must be understood that outpatient pain scales are significantly less accurate that those used for research, where they can be applied under ideal controlled circumstances with minimal exposure to variables. In reality, the score is likely to be a combination of pain intensity and pain affect, where pain affect describes the degree of emotional arousal or changes in action readiness caused by the sensory experience of pain. Factors such as social and work situation, setting, emotional state, anxiety levels, expectation, and prior pain experience may influence pain perception and show large inter-individual differences that may also be affected by time variables.  Patient instructions provided during this appointment: Patient Instructions    ____________________________________________________________________________________________  Medication Rules  Applies to: All patients receiving prescriptions (written or  electronic).  Pharmacy of record: Pharmacy where electronic prescriptions will be sent. If written prescriptions are taken to a different pharmacy, please inform the nursing staff. The pharmacy listed in the electronic medical record should be the one where you would like electronic prescriptions to be sent.  Prescription refills: Only during scheduled appointments. Applies to both, written and electronic prescriptions.  NOTE: The following applies primarily to controlled substances (Opioid* Pain Medications).   Patient's responsibilities: 1. Pain Pills: Bring all pain pills to every appointment (except for procedure appointments). 2. Pill Bottles: Bring pills in original pharmacy bottle. Always bring newest bottle. Bring bottle, even if empty. 3. Medication refills: You are responsible for knowing and keeping track of what medications you need refilled. The day before your appointment, write a list of all prescriptions that need to be refilled. Bring that list to your appointment and give it to the admitting nurse. Prescriptions will be written only during appointments. If you forget a medication, it  will not be "Called in", "Faxed", or "electronically sent". You will need to get another appointment to get these prescribed. 4. Prescription Accuracy: You are responsible for carefully inspecting your prescriptions before leaving our office. Have the discharge nurse carefully go over each prescription with you, before taking them home. Make sure that your name is accurately spelled, that your address is correct. Check the name and dose of your medication to make sure it is accurate. Check the number of pills, and the written instructions to make sure they are clear and accurate. Make sure that you are given enough medication to last until your next medication refill appointment. 5. Taking Medication: Take medication as prescribed. Never take more pills than instructed. Never take medication more  frequently than prescribed. Taking less pills or less frequently is permitted and encouraged, when it comes to controlled substances (written prescriptions).  6. Inform other Doctors: Always inform, all of your healthcare providers, of all the medications you take. 7. Pain Medication from other Providers: You are not allowed to accept any additional pain medication from any other Doctor or Healthcare provider. There are two exceptions to this rule. (see below) In the event that you require additional pain medication, you are responsible for notifying us, as stated below. 8. Medication Agreement: You are responsible for carefully reading and following our Medication Agreement. This must be signed before receiving any prescriptions from our practice. Safely store a copy of your signed Agreement. Violations to the Agreement will result in no further prescriptions. (Additional copies of our Medication Agreement are available upon request.) 9. Laws, Rules, & Regulations: All patients are expected to follow all Federal and [employer], [employer], Rules, [employer]. Ignorance of the Laws does not constitute a valid excuse. The use of any illegal substances is prohibited. 10. Adopted CDC guidelines & recommendations: Target dosing levels will be at or below 60 MME/day. Use of benzodiazepines** is not recommended.  Exceptions: There are only two exceptions to the rule of not receiving pain medications from other Healthcare Providers. 1. Exception #1 (Emergencies): In the event of an emergency (i.e.: accident requiring emergency care), you are allowed to receive additional pain medication. However, you are responsible for: As soon as you are able, call our office [phone], at any time of the day or night, and leave a message stating your name, the date and nature of the emergency, and the name and dose of the medication prescribed. In the event that your call is answered by a member of our staff, make sure to  document and save the date, time, and the name of the person that took your information.  2. Exception #2 (Planned Surgery): In the event that you are scheduled by another doctor or dentist to have any type of surgery or procedure, you are allowed (for a period no longer than 30 days), to receive additional pain medication, for the acute post-op pain. However, in this case, you are responsible for picking up a copy of our "Post-op Pain Management for Surgeons" handout, and giving it to your surgeon or dentist. This document is available at our office, and does not require an appointment to obtain it. Simply go to our office during business hours (Monday-Thursday from 8:00 AM to 4:00 PM) (Friday 8:00 AM to 12:00 Noon) or if you have a scheduled appointment with [place], prior to your surgery, and ask for it by name. In addition, you will need to provide [place] with your name, name of your surgeon, type  of surgery, and date of procedure or surgery.  *Opioid medications include: morphine, codeine, oxycodone, oxymorphone, hydrocodone, hydromorphone, meperidine, tramadol, tapentadol, buprenorphine, fentanyl, methadone. **Benzodiazepine medications include: diazepam (Valium), alprazolam (Xanax), clonazepam (Klonopine), lorazepam (Ativan), clorazepate (Tranxene), chlordiazepoxide (Librium), estazolam (Prosom), oxazepam (Serax), temazepam (Restoril), triazolam (Halcion)  ____________________________________________________________________________________________  ____________________________________________________________________________________________  Pain Scale  Introduction: The pain score used by this practice is the Verbal Numerical Rating Scale (VNRS-11). This is an 11-point scale. It is for adults and children 10 years or older. There are significant differences in how the pain score is reported, used, and applied. Forget everything you learned in the past and learn this scoring system.  General Information:  The scale should reflect your current level of pain. Unless you are specifically asked for the level of your worst pain, or your average pain. If you are asked for one of these two, then it should be understood that it is over the past 24 hours.  Basic Activities of Daily Living (ADL): Personal hygiene, dressing, eating, transferring, and using restroom.  Instructions: Most patients tend to report their level of pain as a combination of two factors, their physical pain and their psychosocial pain. This last one is also known as "suffering" and it is reflection of how physical pain affects you socially and psychologically. From now on, report them separately. From this point on, when asked to report your pain level, report only your physical pain. Use the following table for reference.  Pain Clinic Pain Levels (0-5/10)  Pain Level Score  Description  No Pain 0   Mild pain 1 Nagging, annoying, but does not interfere with basic activities of daily living (ADL). Patients are able to eat, bathe, get dressed, toileting (being able to get on and off the toilet and perform personal hygiene functions), transfer (move in and out of bed or a chair without assistance), and maintain continence (able to control bladder and bowel functions). Blood pressure and heart rate are unaffected. A normal heart rate for a healthy adult ranges from 60 to 100 bpm (beats per minute).   Mild to moderate pain 2 Noticeable and distracting. Impossible to hide from other people. More frequent flare-ups. Still possible to adapt and function close to normal. It can be very annoying and may have occasional stronger flare-ups. With discipline, patients may get used to it and adapt.   Moderate pain 3 Interferes significantly with activities of daily living (ADL). It becomes difficult to feed, bathe, get dressed, get on and off the toilet or to perform personal hygiene functions. Difficult to get in and out of bed or a chair without  assistance. Very distracting. With effort, it can be ignored when deeply involved in activities.   Moderately severe pain 4 Impossible to ignore for more than a few minutes. With effort, patients may still be able to manage work or participate in some social activities. Very difficult to concentrate. Signs of autonomic nervous system discharge are evident: dilated pupils (mydriasis); mild sweating (diaphoresis); sleep interference. Heart rate becomes elevated (>115 bpm). Diastolic blood pressure (lower number) rises above 100 mmHg. Patients find relief in laying down and not moving.   Severe pain 5 Intense and extremely unpleasant. Associated with frowning face and frequent crying. Pain overwhelms the senses.  Ability to do any activity or maintain social relationships becomes significantly limited. Conversation becomes difficult. Pacing back and forth is common, as getting into a comfortable position is nearly impossible. Pain wakes you up from deep sleep. Physical signs will be obvious:  pupillary dilation; increased sweating; goosebumps; brisk reflexes; cold, clammy hands and feet; nausea, vomiting or dry heaves; loss of appetite; significant sleep disturbance with inability to fall asleep or to remain asleep. When persistent, significant weight loss is observed due to the complete loss of appetite and sleep deprivation.  Blood pressure and heart rate becomes significantly elevated. Caution: If elevated blood pressure triggers a pounding headache associated with blurred vision, then the patient should immediately seek attention at an urgent or emergency care unit, as these may be signs of an impending stroke.    Emergency Department Pain Levels (6-10/10)  Emergency Room Pain 6 Severely limiting. Requires emergency care and should not be seen or managed at an outpatient pain management facility. Communication becomes difficult and requires great effort. Assistance to reach the emergency department may be  required. Facial flushing and profuse sweating along with potentially dangerous increases in heart rate and blood pressure will be evident.   Distressing pain 7 Self-care is very difficult. Assistance is required to transport, or use restroom. Assistance to reach the emergency department will be required. Tasks requiring coordination, such as bathing and getting dressed become very difficult.   Disabling pain 8 Self-care is no longer possible. At this level, pain is disabling. The individual is unable to do even the most "basic" activities such as walking, eating, bathing, dressing, transferring to a bed, or toileting. Fine motor skills are lost. It is difficult to think clearly.   Incapacitating pain 9 Pain becomes incapacitating. Thought processing is no longer possible. Difficult to remember your own name. Control of movement and coordination are lost.   The worst pain imaginable 10 At this level, most patients pass out from pain. When this level is reached, collapse of the autonomic nervous system occurs, leading to a sudden drop in blood pressure and heart rate. This in turn results in a temporary and dramatic drop in blood flow to the brain, leading to a loss of consciousness. Fainting is one of the body's self defense mechanisms. Passing out puts the brain in a calmed state and causes it to shut down for a while, in order to begin the healing process.    Summary: 1. Refer to this scale when providing [place] with your pain level. 2. Be accurate and careful when reporting your pain level. This will help with your care. 3. Over-reporting your pain level will lead to loss of credibility. 4. Even a level of 1/10 means that there is pain and will be treated at our facility. 5. High, inaccurate reporting will be documented as "Symptom Exaggeration", leading to loss of credibility and suspicions of possible secondary gains such as obtaining more narcotics, or wanting to appear disabled, for fraudulent  reasons. 6. Only pain levels of 5 or below will be seen at our facility. 7. Pain levels of 6 and above will be sent to the Emergency Department and the appointment cancelled. ____________________________________________________________________________________________

## 2016-12-06 NOTE — Progress Notes (Signed)
Nursing Pain Medication Assessment:  Safety precautions to be maintained throughout the outpatient stay will include: orient to surroundings, keep bed in low position, maintain call bell within reach at all times, provide assistance with transfer out of bed and ambulation.  Medication Inspection Compliance: Pill count conducted under aseptic conditions, in front of the patient. Neither the pills nor the bottle was removed from the patient's sight at any time. Once count was completed pills were immediately returned to the patient in their original bottle.  Medication: Oxycodone IR Pill/Patch Count: 3 of 60 pills remain Pill/Patch Appearance: Markings consistent with prescribed medication Bottle Appearance: Standard pharmacy container. Clearly labeled. Filled Date:10 / 17 / 2018 Last Medication intake:  Today

## 2016-12-07 ENCOUNTER — Ambulatory Visit: Payer: BLUE CROSS/BLUE SHIELD | Admitting: Unknown Physician Specialty

## 2016-12-07 ENCOUNTER — Encounter: Payer: Self-pay | Admitting: Unknown Physician Specialty

## 2016-12-07 DIAGNOSIS — F1490 Cocaine use, unspecified, uncomplicated: Secondary | ICD-10-CM | POA: Diagnosis not present

## 2016-12-07 DIAGNOSIS — Z79891 Long term (current) use of opiate analgesic: Secondary | ICD-10-CM | POA: Diagnosis not present

## 2016-12-07 DIAGNOSIS — Z9114 Patient's other noncompliance with medication regimen: Secondary | ICD-10-CM | POA: Diagnosis not present

## 2016-12-07 DIAGNOSIS — G894 Chronic pain syndrome: Secondary | ICD-10-CM | POA: Diagnosis not present

## 2016-12-07 NOTE — Assessment & Plan Note (Signed)
Unable to prescribe.  Will refer to another pain clinic

## 2016-12-07 NOTE — Progress Notes (Signed)
s  BP 117/69   Pulse 80   Temp 98.4 F (36.9 C) (Oral)   Wt 205 lb 6.4 oz (93.2 kg)   SpO2 96%   BMI 31.23 kg/m    Subjective:    Patient ID: Kayla Wright Pilar, female    DOB: 07/03/1955, 61 y.o.   MRN: [id number]  HPI: Kayla Wright [name] is a 61 y.o. female  Chief Complaint  Patient presents with  . Medication Problem    pt states she was dismissed from the pain clinic yesterday for positive cocaine in her urine, pt states she has never done cocaine and has only taken what has been prescribed to her    Pt is here for the problem noted as above.  Pt tested positive Benzoylecgonine in her urine.  Pt states she does not use such drugs.  Low level of 35 in her blood.  I know her well and it is inconsistent with her history.  She has been taking Oxycodone 5 mg TID.  Denies any topical anesthetic solutions.  States she has tried a number of medications and nothing else has worked for her.    Relevant past medical, surgical, family and social history reviewed and updated as indicated. Interim medical history since our last visit reviewed. Allergies and medications reviewed and updated.  Review of Systems  Per HPI unless specifically indicated above     Objective:    BP 117/69   Pulse 80   Temp 98.4 F (36.9 C) (Oral)   Wt 205 lb 6.4 oz (93.2 kg)   SpO2 96%   BMI 31.23 kg/m   Wt Readings from Last 3 Encounters:  12/07/16 205 lb 6.4 oz (93.2 kg)  12/06/16 197 lb (89.4 kg)  11/15/16 200 lb 6.4 oz (90.9 kg)    Physical Exam  Constitutional: She is oriented to person, place, and time. She appears well-developed and well-nourished. No distress.  HENT:  Head: Normocephalic and atraumatic.  Eyes: Conjunctivae and lids are normal. Right eye exhibits no discharge. Left eye exhibits no discharge. No scleral icterus.  Neck: Normal range of motion. Neck supple. No JVD present. Carotid bruit is not present.  Cardiovascular: Normal rate, regular rhythm and normal heart sounds.  Pulmonary/Chest:  Effort normal and breath sounds normal.  Abdominal: Normal appearance. There is no splenomegaly or hepatomegaly.  Musculoskeletal: Normal range of motion.  Neurological: She is alert and oriented to person, place, and time.  Skin: Skin is warm, dry and intact. No rash noted. No pallor.  Psychiatric: She has a normal mood and affect. Her behavior is normal. Judgment and thought content normal.    Results for orders placed or performed in visit on 11/09/16  ToxASSURE Select 13 (MW), Urine  Result Value Ref Range   Summary FINAL       Assessment & Plan:   Problem List Items Addressed This Visit      Unprioritized   Chronic pain syndrome (Chronic)    Secondary to a complex ankle fracture with delayed healing.  I told her I cannot prescribe these medications long term based on new laws and regulations.  I will refer to another pain clinic      Relevant Orders   Ambulatory referral to Pain Clinic   Cocaine use    Pt denies use.  Reviewed lab tests positive for Benxoylecgonine.  This is inconsistent with her history that I know.  Low level of 35 nanograms/m creat noted.        Long  term prescription opiate use (Chronic)    Unable to prescribe.  Will refer to another pain clinic      Relevant Orders   Urine drugs of abuse scrn w alc, routine (Ref Lab)   Pain medication agreement broken    I will recheck a urine drug screen today          Follow up plan: Return if symptoms worsen or fail to improve.

## 2016-12-07 NOTE — Assessment & Plan Note (Addendum)
Pt denies use.  Reviewed lab tests positive for Benxoylecgonine.  This is inconsistent with her history that I know.  Low level of 35 nanograms/m creat noted.

## 2016-12-07 NOTE — Assessment & Plan Note (Signed)
Secondary to a complex ankle fracture with delayed healing.  I told her I cannot prescribe these medications long term based on new laws and regulations.  I will refer to another pain clinic

## 2016-12-07 NOTE — Assessment & Plan Note (Signed)
I will recheck a urine drug screen today

## 2016-12-08 LAB — URINE DRUGS OF ABUSE SCREEN W ALC, ROUTINE (REF LAB)
Amphetamines, Urine: NEGATIVE ng/mL
Barbiturate Quant, Ur: NEGATIVE ng/mL
Benzodiazepine Quant, Ur: NEGATIVE ng/mL
Cannabinoid Quant, Ur: NEGATIVE ng/mL
Cocaine (Metab.): NEGATIVE ng/mL
Ethanol, Urine: NEGATIVE %
Methadone Screen, Urine: NEGATIVE ng/mL
Opiate Quant, Ur: NEGATIVE ng/mL
PCP Quant, Ur: NEGATIVE ng/mL
Propoxyphene: NEGATIVE ng/mL

## 2016-12-12 ENCOUNTER — Telehealth: Payer: Self-pay | Admitting: Unknown Physician Specialty

## 2016-12-12 DIAGNOSIS — M25572 Pain in left ankle and joints of left foot: Principal | ICD-10-CM

## 2016-12-12 DIAGNOSIS — G8929 Other chronic pain: Secondary | ICD-10-CM

## 2016-12-12 NOTE — Telephone Encounter (Signed)
Called and spoke with patient. I let her know about negative drug screen per result note. Also let patient know about referral. Patient states that she is OK with sending the referral to [facility]. Will route back to referral coordinator to redirect referral.

## 2016-12-12 NOTE — Telephone Encounter (Signed)
I can try to send her to [facility] next, they just take a while.  Patient dismissed by Taylor.

## 2016-12-12 NOTE — Telephone Encounter (Signed)
Copied from CRM [id number]. Topic: Inquiry >> Dec 12, 2016  9:39 AM [name], Kelly, [place] wrote: Reason for CRM: Patient would like for Kayla Wright or her nurse to give her a call back please. Patient stated that someone had gave her a call Friday and she missed it.

## 2016-12-12 NOTE — Telephone Encounter (Signed)
Received a fax from [facility] stating that the patient does not currently meet criteria for these services. Is there somewhere else we can send her to for pain management?

## 2016-12-12 NOTE — Telephone Encounter (Signed)
Looked in patients chart and saw that she was last seen for medication follow-up 12/08/2016.  Please Advise.

## 2016-12-13 ENCOUNTER — Ambulatory Visit: Payer: BLUE CROSS/BLUE SHIELD | Admitting: Unknown Physician Specialty

## 2016-12-13 NOTE — Telephone Encounter (Signed)
Elnita MaxwellCheryl, could you enter new pain management referral for patient?

## 2016-12-16 ENCOUNTER — Other Ambulatory Visit: Payer: Self-pay | Admitting: Unknown Physician Specialty

## 2016-12-16 ENCOUNTER — Telehealth: Payer: Self-pay

## 2016-12-16 NOTE — Telephone Encounter (Signed)
Pharmacy sent a refill request for seroquel which is not in the patient's chart. OV note from 11/15/16 states that it was going to be sent in for the patient. Please advise.

## 2016-12-16 NOTE — Telephone Encounter (Signed)
This is no longer a medication she is on

## 2016-12-16 NOTE — Telephone Encounter (Signed)
Pharmacy notified.

## 2016-12-17 ENCOUNTER — Other Ambulatory Visit: Payer: Self-pay | Admitting: Unknown Physician Specialty

## 2016-12-19 NOTE — Telephone Encounter (Signed)
Spoke with pt. And she reports she has been on Bubropion. Medication is not on her medication list.

## 2016-12-19 NOTE — Telephone Encounter (Signed)
Pt states she takes 3 of these pills a day & the pharmacy will not refill. I advised to her the message below. Also she needs a refill on the BUBROPION. Pharmacy told her no refills left on it. She said she has a refill on the bottle of the SEIOQUEL. Pharmacy is [facility] on [address]

## 2016-12-23 ENCOUNTER — Encounter: Payer: Self-pay | Admitting: Unknown Physician Specialty

## 2016-12-23 ENCOUNTER — Ambulatory Visit: Payer: BLUE CROSS/BLUE SHIELD | Admitting: Unknown Physician Specialty

## 2016-12-23 DIAGNOSIS — G8929 Other chronic pain: Secondary | ICD-10-CM

## 2016-12-23 DIAGNOSIS — M25572 Pain in left ankle and joints of left foot: Secondary | ICD-10-CM | POA: Diagnosis not present

## 2016-12-23 DIAGNOSIS — F5101 Primary insomnia: Secondary | ICD-10-CM | POA: Diagnosis not present

## 2016-12-23 DIAGNOSIS — F322 Major depressive disorder, single episode, severe without psychotic features: Secondary | ICD-10-CM

## 2016-12-23 MED ORDER — BUPROPION HCL ER (XL) 150 MG PO TB24: 150.0000 mg | ORAL_TABLET | Freq: Every day | ORAL | 2 refills | Status: DC

## 2016-12-23 MED ORDER — QUETIAPINE FUMARATE 50 MG PO TABS: 150.0000 mg | ORAL_TABLET | Freq: Every day | ORAL | 1 refills | Status: DC

## 2016-12-23 NOTE — Progress Notes (Signed)
BP 109/65   Pulse 68   Temp 98 F (36.7 C) (Oral)   Wt 207 lb 12.8 oz (94.3 kg)   SpO2 98%   BMI 31.60 kg/m    Subjective:    Patient ID: Kayla Wright, female    DOB: 01/20/1956, 61 y.o.   MRN: [id number]  HPI: Kayla Wright is a 61 y.o. female  Chief Complaint  Patient presents with  . Depression    4 week f/up    Chronic pain Secondary to severe ankle fracture leading to permanent disability.  Failed a drug test at the pain clinic though confusing as patient is not around or ever taken an illegal substance.  Drug screen here is normal.    Depression  is severe and significant.  Was on Seroquel and Buproprion which were helping but ran out at the beginning of the week.  Not sleeping well.  Very depressed but no suicidal ideation.  Looking forward to a grandchild coming.   [facility] 12/23/2016 11/15/2016 11/09/2016 10/10/2016 09/13/2016  Decreased Interest 3 2 0 (No Data) 2  Down, Depressed, Hopeless 3 2 0 - 1  PHQ - 2 Score 6 4 0 - 3  Altered sleeping 2 1 - - 2  Tired, decreased energy 3 1 - - 1  Change in appetite 2 2 - - 2  Feeling bad or failure about yourself  2 2 - - 1  Trouble concentrating 2 0 - - 1  Moving slowly or fidgety/restless 2 0 - - 2  Suicidal thoughts 1 2 - - 2  PHQ-9 Score 20 12 - - 14     Relevant past medical, surgical, family and social history reviewed and updated as indicated. Interim medical history since our last visit reviewed. Allergies and medications reviewed and updated.  Review of Systems  Constitutional: Positive for fatigue.  Respiratory: Negative.   Cardiovascular: Negative.   Gastrointestinal: Negative.     Per HPI unless specifically indicated above     Objective:    BP 109/65   Pulse 68   Temp 98 F (36.7 C) (Oral)   Wt 207 lb 12.8 oz (94.3 kg)   SpO2 98%   BMI 31.60 kg/m   Wt Readings from Last 3 Encounters:  12/23/16 207 lb 12.8 oz (94.3 kg)  12/07/16 205 lb 6.4 oz (93.2 kg)  12/06/16 197 lb  (89.4 kg)    Physical Exam  Constitutional: She is oriented to person, place, and time. She appears well-developed and well-nourished. No distress.  HENT:  Head: Normocephalic and atraumatic.  Eyes: Conjunctivae and lids are normal. Right eye exhibits no discharge. Left eye exhibits no discharge. No scleral icterus.  Cardiovascular: Normal rate.  Pulmonary/Chest: Effort normal.  Abdominal: Normal appearance. There is no splenomegaly or hepatomegaly.  Musculoskeletal: Normal range of motion.  Walking with a cane  Neurological: She is alert and oriented to person, place, and time.  Skin: Skin is intact. No rash noted. No pallor.  Psychiatric: She has a normal mood and affect. Her behavior is normal. Judgment and thought content normal.    Results for orders placed or performed in visit on 12/07/16  Urine drugs of abuse scrn w alc, routine (Ref Lab)  Result Value Ref Range   Amphetamines, Urine Negative Cutoff=1000 ng/mL   Barbiturate Quant, Ur Negative Cutoff=300 ng/mL   Benzodiazepine Quant, Ur Negative Cutoff=300 ng/mL   Cannabinoid Quant, Ur Negative Cutoff=50 ng/mL   Cocaine (Metab.) Negative Cutoff=300 ng/mL  Opiate Quant, Ur Negative Cutoff=300 ng/mL   PCP Quant, Ur Negative Cutoff=25 ng/mL   Methadone Screen, Urine Negative Cutoff=300 ng/mL   Propoxyphene Negative Cutoff=300 ng/mL   Ethanol, Urine Negative Cutoff=0.020 %      Assessment & Plan:   Problem List Items Addressed This Visit      Unprioritized   Chronic ankle pain (Primary Area of Pain) (Left) (Chronic)    Needs to go to a pain clinic.  Referral placed      Depression, major, single episode, severe (HCC)    Restart Seroquel with taper up to 150 mg.  Restart Buproprion XL 150 mg.        Relevant Medications   buPROPion (WELLBUTRIN XL) 150 MG 24 hr tablet   Insomnia    Sleep hygeine discussed.  Hopefully will benefit from Seroquel          Follow up plan: Return in about 6 weeks (around  02/03/2017).

## 2016-12-23 NOTE — Assessment & Plan Note (Signed)
Restart Seroquel with taper up to 150 mg.  Restart Buproprion XL 150 mg.

## 2016-12-23 NOTE — Assessment & Plan Note (Signed)
Sleep hygeine discussed.  Hopefully will benefit from Seroquel

## 2016-12-23 NOTE — Assessment & Plan Note (Signed)
Needs to go to a pain clinic.  Referral placed

## 2017-01-12 ENCOUNTER — Telehealth: Payer: Self-pay | Admitting: Unknown Physician Specialty

## 2017-01-12 NOTE — Telephone Encounter (Signed)
Spoke with patient. She had questions about pain referral. Answered questions to the best of my ability. Advised that it appeared [facility] Pt was given their number.

## 2017-01-12 NOTE — Telephone Encounter (Signed)
Left message on machine for pt to return call to the office.  

## 2017-01-12 NOTE — Telephone Encounter (Signed)
Copied from CRM [id number]. Topic: Quick Communication - See Telephone Encounter >> Jan 12, 2017  3:25 PM Rudi CocoLathan, Kaedin Hicklin M, NT wrote: CRM for notification. See Telephone encounter for:   01/12/17.Patient would like for Gabriel Cirriheryl Wicker or her nurse to give her a call back please. Pt. Can be reached at [phone] (pain clinic)

## 2017-01-13 ENCOUNTER — Telehealth: Payer: Self-pay

## 2017-01-13 NOTE — Telephone Encounter (Signed)
I would have to try to refer patient somewhere else.

## 2017-01-13 NOTE — Telephone Encounter (Signed)
Thanks

## 2017-01-13 NOTE — Telephone Encounter (Signed)
Kayla Wright, how can we help this pt?

## 2017-01-13 NOTE — Telephone Encounter (Signed)
Copied from CRM [id number]. Topic: Referral - Request >> Jan 12, 2017  3:57 PM Eston Mouldavis, Cheri B wrote: Reason for CRM: Pt called back to say  [facility] and would like another referral       Routing to provider.

## 2017-01-13 NOTE — Progress Notes (Signed)
NOTE: This forensic urine drug screen (UDS) test was conducted using a state-of-the-art ultra high performance liquid chromatography and mass spectrometry system (UPLC/MS-MS), the most sophisticated and accurate method available. UPLC/MS-MS is 1,000 times more precise and accurate than standard gas chromatography and mass spectrometry (GC/MS). This system can analyze 26 drug categories and 180 drug compounds.  The results of this test came back with unexpected findings. Unreported Benzoylecgonine, a metabolite of cocaine; its presence indicates use of this drug. Benzoylecgonine is only found in nature as a metabolite of cocaine, and there would be no other valid reason for its presence in a drug screen.  The findings of this UDT are unacceptable and incompatible with appropriate, safe, and responsible use of controlled substances. This represents non-compliance with the safe use of controlled substances. We will no longer offer this therapy as a treatment option for this patient.

## 2017-01-18 NOTE — Telephone Encounter (Signed)
Copied from CRM [phone]. Topic: Referral - Request >> Jan 12, 2017  3:57 PM Eston Mouldavis, Cheri B wrote: Reason for CRM: Pt called back to say  [facility] and would like another referral

## 2017-01-20 NOTE — Telephone Encounter (Signed)
Pain Clinics are denying patient because of the positive cocaine use even though you documented this has never happened before.  Is there any more documentation we can add to try to get referral approved.

## 2017-01-20 NOTE — Telephone Encounter (Signed)
I don't think there is anything I can do to help the situation.  Perhaps ask the pain clinics what they would need?

## 2017-01-25 NOTE — Telephone Encounter (Signed)
Referral sent to [facility] to see if they will accept patient.

## 2017-02-03 ENCOUNTER — Ambulatory Visit: Payer: BLUE CROSS/BLUE SHIELD | Admitting: Unknown Physician Specialty

## 2017-02-03 ENCOUNTER — Encounter: Payer: Self-pay | Admitting: Unknown Physician Specialty

## 2017-02-03 DIAGNOSIS — F322 Major depressive disorder, single episode, severe without psychotic features: Secondary | ICD-10-CM | POA: Diagnosis not present

## 2017-02-03 DIAGNOSIS — F329 Major depressive disorder, single episode, unspecified: Secondary | ICD-10-CM | POA: Diagnosis not present

## 2017-02-03 DIAGNOSIS — F32A Depression, unspecified: Secondary | ICD-10-CM

## 2017-02-03 DIAGNOSIS — G8929 Other chronic pain: Secondary | ICD-10-CM

## 2017-02-03 DIAGNOSIS — R5383 Other fatigue: Secondary | ICD-10-CM | POA: Diagnosis not present

## 2017-02-03 DIAGNOSIS — M25572 Pain in left ankle and joints of left foot: Secondary | ICD-10-CM

## 2017-02-03 MED ORDER — BUPROPION HCL ER (XL) 300 MG PO TB24: 300.0000 mg | ORAL_TABLET | Freq: Every day | ORAL | 1 refills | Status: DC

## 2017-02-03 MED ORDER — QUETIAPINE FUMARATE ER 150 MG PO TB24: 150.0000 mg | ORAL_TABLET | Freq: Every day | ORAL | 1 refills | Status: DC

## 2017-02-03 NOTE — Assessment & Plan Note (Signed)
Pt with improvement after restarting Seroquel and Buproprion.  Will increase Buproprion from 150 mg to 300 mg.

## 2017-02-03 NOTE — Patient Instructions (Signed)
Pain clinic Dr Welton FlakesKhan  [phone]

## 2017-02-03 NOTE — Progress Notes (Signed)
BP 116/68   Pulse 71   Temp 98.4 F (36.9 C) (Oral)   Wt 212 lb 12.8 oz (96.5 kg)   SpO2 98%   BMI 32.36 kg/m    Subjective:    Patient ID: Kayla Wright Acoff, female    DOB: 05/16/1955, 62 y.o.   MRN: [id number]  HPI: Kayla Wright [place] is a 62 y.o. female  Chief Complaint  Patient presents with  . Depression    6 week f/up    Chronic pain Secondary to severe ankle fracture leading to permanent disability.  Failed a drug test at the pain clinic though denies and history confirms low risk of illegal substance abuse.  Drug screen here is normal.  Working on getting with another pain clinic.    Depression Last visit we restarted Buproprion and Seroquel.  Taking 150 mg of both.    [facility] 02/03/2017 12/23/2016 11/15/2016 11/09/2016 10/10/2016  Decreased Interest 1 3 2  0 (No Data)  Down, Depressed, Hopeless 1 3 2  0 -  PHQ - 2 Score 2 6 4  0 -  Altered sleeping 1 2 1  - -  Tired, decreased energy 1 3 1  - -  Change in appetite 0 2 2 - -  Feeling bad or failure about yourself  2 2 2  - -  Trouble concentrating 1 2 0 - -  Moving slowly or fidgety/restless 0 2 0 - -  Suicidal thoughts 1 1 2  - -  PHQ-9 Score 8 20 12  - -      Relevant past medical, surgical, family and social history reviewed and updated as indicated. Interim medical history since our last visit reviewed. Allergies and medications reviewed and updated.  Review of Systems  Respiratory: Negative.   Cardiovascular: Negative.   Gastrointestinal: Negative.   Neurological: Negative.     Per HPI unless specifically indicated above     Objective:    BP 116/68   Pulse 71   Temp 98.4 F (36.9 C) (Oral)   Wt 212 lb 12.8 oz (96.5 kg)   SpO2 98%   BMI 32.36 kg/m   Wt Readings from Last 3 Encounters:  02/03/17 212 lb 12.8 oz (96.5 kg)  12/23/16 207 lb 12.8 oz (94.3 kg)  12/07/16 205 lb 6.4 oz (93.2 kg)    Physical Exam  Constitutional: She is oriented to person, place, and time. She appears  well-developed and well-nourished. No distress.  HENT:  Head: Normocephalic and atraumatic.  Eyes: Conjunctivae and lids are normal. Right eye exhibits no discharge. Left eye exhibits no discharge. No scleral icterus.  Cardiovascular: Normal rate.  Pulmonary/Chest: Effort normal.  Abdominal: Normal appearance. There is no splenomegaly or hepatomegaly.  Musculoskeletal: Normal range of motion.  Neurological: She is alert and oriented to person, place, and time.  Skin: Skin is intact. No rash noted. No pallor.  Psychiatric: She has a normal mood and affect. Her behavior is normal. Judgment and thought content normal.    Results for orders placed or performed in visit on 12/07/16  Urine drugs of abuse scrn w alc, routine (Ref Lab)  Result Value Ref Range   Amphetamines, Urine Negative Cutoff=1000 ng/mL   Barbiturate Quant, Ur Negative Cutoff=300 ng/mL   Benzodiazepine Quant, Ur Negative Cutoff=300 ng/mL   Cannabinoid Quant, Ur Negative Cutoff=50 ng/mL   Cocaine (Metab.) Negative Cutoff=300 ng/mL   Opiate Quant, Ur Negative Cutoff=300 ng/mL   PCP Quant, Ur Negative Cutoff=25 ng/mL   Methadone Screen, Urine Negative Cutoff=300 ng/mL  Propoxyphene Negative Cutoff=300 ng/mL   Ethanol, Urine Negative Cutoff=0.020 %      Assessment & Plan:   Problem List Items Addressed This Visit      Unprioritized   Chronic ankle pain (Primary Area of Pain) (Left) (Chronic)    Unable to get her to be accepted to pain management.  Another resource unknown to me given.  She will call      Fatigue due to depression    Increase Buproprion as noted above.  Recheck in 4 weeks      Severe depression (HCC)    Pt with improvement after restarting Seroquel and Buproprion.  Will increase Buproprion from 150 mg to 300 mg.       Relevant Medications   buPROPion (WELLBUTRIN XL) 300 MG 24 hr tablet       Follow up plan: Return in about 4 weeks (around 03/03/2017).

## 2017-02-03 NOTE — Assessment & Plan Note (Signed)
Increase Buproprion as noted above.  Recheck in 4 weeks

## 2017-02-03 NOTE — Assessment & Plan Note (Signed)
Unable to get her to be accepted to pain management.  Another resource unknown to me given.  She will call

## 2017-02-09 ENCOUNTER — Other Ambulatory Visit: Payer: Self-pay | Admitting: Unknown Physician Specialty

## 2017-02-13 ENCOUNTER — Other Ambulatory Visit: Payer: Self-pay | Admitting: Unknown Physician Specialty

## 2017-02-14 ENCOUNTER — Telehealth: Payer: Self-pay | Admitting: Unknown Physician Specialty

## 2017-02-14 NOTE — Telephone Encounter (Signed)
Copied from CRM [phone]. Topic: Referral - Request >> Jan 12, 2017  3:57 PM Eston Mouldavis, Cheri B wrote: Reason for CRM: Pt called back to say  [facility] and would like another referral     >> Jan 18, 2017  3:32 PM Cecelia ByarsGreen, Temeka L, RMA wrote: Patient would like a call back from Gabriel Cirriheryl Wicker or CMA about status of pain clinic referral  >> Feb 14, 2017  2:31 PM Rudi CocoLathan, Niveah Boerner M, NT wrote: Pt. Calling back and said the pain clinic did not receive referral

## 2017-02-15 NOTE — Telephone Encounter (Signed)
I have referred patient to [facility] in [place] last week. Waiting to hear if they will accept her referral.  Patient can call to see the status of her referral if she would like.  [phone].  [facility] for [facility] to give information.

## 2017-02-22 ENCOUNTER — Encounter: Payer: Self-pay | Admitting: Unknown Physician Specialty

## 2017-02-22 ENCOUNTER — Ambulatory Visit: Payer: BLUE CROSS/BLUE SHIELD | Admitting: Unknown Physician Specialty

## 2017-02-22 VITALS — BP 120/76 | HR 87 | Temp 98.4°F | Wt 211.0 lb

## 2017-02-22 DIAGNOSIS — R05 Cough: Secondary | ICD-10-CM | POA: Diagnosis not present

## 2017-02-22 DIAGNOSIS — R6883 Chills (without fever): Secondary | ICD-10-CM

## 2017-02-22 DIAGNOSIS — R059 Cough, unspecified: Secondary | ICD-10-CM

## 2017-02-22 MED ORDER — PREDNISONE 20 MG PO TABS: 40.0000 mg | ORAL_TABLET | Freq: Every day | ORAL | 0 refills | Status: DC

## 2017-02-22 MED ORDER — BENZONATATE 200 MG PO CAPS: 200.0000 mg | ORAL_CAPSULE | Freq: Two times a day (BID) | ORAL | 0 refills | Status: DC | PRN

## 2017-02-22 NOTE — Telephone Encounter (Signed)
[facility] accepted patient's referral, but they do no take patient's insurance. [facility]'s [facility]   Most outside places won't accept her referral because of her insurance.  Kayla MaxwellCheryl, could you maybe route or speak to Dr. Laban EmperorNaveira about the patient? Since her tox screens were negative here. I'm not sure how to proceed. Unless patient wants to help research places, but we're now going out of [place], [place], [place].  Patient notified of [facility] not accepting her insurance. Routing to provider for next steps.

## 2017-02-22 NOTE — Progress Notes (Signed)
BP 120/76   Pulse 87   Temp 98.4 F (36.9 C) (Oral)   Wt 211 lb (95.7 kg)   SpO2 96%   BMI 32.08 kg/m    Subjective:    Patient ID: Kayla Wright, female    DOB: 11/27/1955, 62 y.o.   MRN: [id number]  HPI: Kayla Wright is a 62 y.o. female  Chief Complaint  Patient presents with  . Cough    pt states she has had a real bad cough for a week, states she does not have any other symptoms   Cough  This is a new problem. The current episode started in the past 7 days. The problem has been gradually worsening. The problem occurs constantly. The cough is productive of sputum. Associated symptoms include chills, shortness of breath and sweats. Pertinent negatives include no chest pain, ear congestion, ear pain, headaches, heartburn, hemoptysis, myalgias, nasal congestion, postnasal drip, rhinorrhea, weight loss or wheezing. Nothing aggravates the symptoms. She has tried OTC cough suppressant and a beta-agonist inhaler (taking beta agonist twice a day) for the symptoms. The treatment provided no relief. Her past medical history is significant for asthma.    Relevant past medical, surgical, family and social history reviewed and updated as indicated. Interim medical history since our last visit reviewed. Allergies and medications reviewed and updated.  Review of Systems  Constitutional: Positive for chills. Negative for weight loss.  HENT: Negative for ear pain, postnasal drip and rhinorrhea.   Respiratory: Positive for cough and shortness of breath. Negative for hemoptysis and wheezing.   Cardiovascular: Negative for chest pain.  Gastrointestinal: Negative for heartburn.  Musculoskeletal: Negative for myalgias.  Neurological: Negative for headaches.    Per HPI unless specifically indicated above     Objective:    BP 120/76   Pulse 87   Temp 98.4 F (36.9 C) (Oral)   Wt 211 lb (95.7 kg)   SpO2 96%   BMI 32.08 kg/m   Wt Readings from Last 3 Encounters:  02/22/17 211 lb (95.7  kg)  02/03/17 212 lb 12.8 oz (96.5 kg)  12/23/16 207 lb 12.8 oz (94.3 kg)    Physical Exam  Constitutional: She is oriented to person, place, and time. She appears well-developed and well-nourished. No distress.  HENT:  Head: Normocephalic and atraumatic.  Eyes: Conjunctivae and lids are normal. Right eye exhibits no discharge. Left eye exhibits no discharge. No scleral icterus.  Neck: Normal range of motion. Neck supple. No JVD present. Carotid bruit is not present.  Cardiovascular: Normal rate, regular rhythm and normal heart sounds.  Pulmonary/Chest: Effort normal. She has decreased breath sounds in the right upper field.  Abdominal: Normal appearance. There is no splenomegaly or hepatomegaly.  Musculoskeletal: Normal range of motion.  Neurological: She is alert and oriented to person, place, and time.  Skin: Skin is warm, dry and intact. No rash noted. No pallor.  Psychiatric: She has a normal mood and affect. Her behavior is normal. Judgment and thought content normal.    Results for orders placed or performed in visit on 12/07/16  Urine drugs of abuse scrn w alc, routine (Ref Lab)  Result Value Ref Range   Amphetamines, Urine Negative Cutoff=1000 ng/mL   Barbiturate Quant, Ur Negative Cutoff=300 ng/mL   Benzodiazepine Quant, Ur Negative Cutoff=300 ng/mL   Cannabinoid Quant, Ur Negative Cutoff=50 ng/mL   Cocaine (Metab.) Negative Cutoff=300 ng/mL   Opiate Quant, Ur Negative Cutoff=300 ng/mL   PCP Quant, Ur Negative Cutoff=25 ng/mL  Methadone Screen, Urine Negative Cutoff=300 ng/mL   Propoxyphene Negative Cutoff=300 ng/mL   Ethanol, Urine Negative Cutoff=0.020 %      Assessment & Plan:   Problem List Items Addressed This Visit    None    Visit Diagnoses    Cough    -  Primary   New symptom.  LCTA.  Add prednisone 40 mg daily.  Tessalon Perles for cough.  Will get chest x-ray.  If negative continue with supportive care   Relevant Orders   DG Chest 2 View   Chills         suspect viral source.  supportive care with fluids and rest.  Chest x-ray to r/o pneumonia       Follow up plan: Return if symptoms worsen or fail to improve.

## 2017-02-24 NOTE — Telephone Encounter (Signed)
Patient notified a verbalized understanding.

## 2017-02-24 NOTE — Telephone Encounter (Signed)
Dr. Laban EmperorNaveira has strict rules on this and my call will not make a difference.  Please let pt know the status of the referral.  Her doctor who did her ankle is a [facility] physician?  I would ask her to return to him to consider options.  Perhaps the [facility] pain clinic is an option if referred by a [facility] physician

## 2017-03-07 ENCOUNTER — Ambulatory Visit
Admission: RE | Admit: 2017-03-07 | Discharge: 2017-03-07 | Disposition: A | Discharge status: Home / Self Care | Payer: BLUE CROSS/BLUE SHIELD | Source: Ambulatory Visit | Attending: Unknown Physician Specialty | Admitting: Unknown Physician Specialty

## 2017-03-07 ENCOUNTER — Ambulatory Visit: Payer: BLUE CROSS/BLUE SHIELD | Admitting: Unknown Physician Specialty

## 2017-03-07 ENCOUNTER — Encounter: Payer: Self-pay | Admitting: Unknown Physician Specialty

## 2017-03-07 VITALS — BP 112/66 | HR 68 | Temp 98.4°F | Wt 212.6 lb

## 2017-03-07 DIAGNOSIS — F322 Major depressive disorder, single episode, severe without psychotic features: Secondary | ICD-10-CM

## 2017-03-07 DIAGNOSIS — R059 Cough, unspecified: Secondary | ICD-10-CM

## 2017-03-07 DIAGNOSIS — R05 Cough: Secondary | ICD-10-CM | POA: Insufficient documentation

## 2017-03-07 MED ORDER — DOXYCYCLINE HYCLATE 100 MG PO TABS
100.0000 mg | ORAL_TABLET | Freq: Two times a day (BID) | ORAL | 0 refills | Status: DC
Start: 2017-03-07 — End: 2017-08-25

## 2017-03-07 NOTE — Progress Notes (Signed)
BP 112/66   Pulse 68   Temp 98.4 F (36.9 C) (Oral)   Wt 212 lb 9.6 oz (96.4 kg)   SpO2 99%   BMI 32.33 kg/m    Subjective:    Patient ID: Kayla Wright, female    DOB: 08/03/1955, 62 y.o.   MRN: [id number]  HPI: Kayla AlarRuth H Helfrich is a 62 y.o. female  Chief Complaint  Patient presents with  . Depression    4 week f/up  . Fatigue    4 week f/up    Pt with continued cough.  I gave her prednisone and Tessalon Perles on 1/11.  She has not gotten the chest x-ray ordered at the time.    Cough  The problem has been unchanged. The problem occurs constantly. The cough is non-productive. Associated symptoms include shortness of breath. Pertinent negatives include no chest pain, chills, ear congestion, ear pain, fever, headaches, heartburn, hemoptysis, myalgias, nasal congestion, postnasal drip, rash, rhinorrhea, sore throat, sweats, weight loss or wheezing. Nothing aggravates the symptoms. She has tried nothing for the symptoms. The treatment provided no relief.   Depression I have tried her on a number of medications.  It seems she is doing well but it turns out she is taking only Fluoxetine and not Seroquel and Wellbutrin.   [facility] 03/07/2017 02/03/2017 12/23/2016 11/15/2016 11/09/2016  Decreased Interest 1 1 3 2  0  Down, Depressed, Hopeless 2 1 3 2  0  PHQ - 2 Score 3 2 6 4  0  Altered sleeping 0 1 2 1  -  Tired, decreased energy 1 1 3 1  -  Change in appetite 0 0 2 2 -  Feeling bad or failure about yourself  1 2 2 2  -  Trouble concentrating 0 1 2 0 -  Moving slowly or fidgety/restless 0 0 2 0 -  Suicidal thoughts 0 1 1 2  -  PHQ-9 Score 5 8 20 12  -  Some recent data might be hidden      Relevant past medical, surgical, family and social history reviewed and updated as indicated. Interim medical history since our last visit reviewed. Allergies and medications reviewed and updated.  Review of Systems  Constitutional: Negative for chills, fever and weight loss.    HENT: Negative for ear pain, postnasal drip, rhinorrhea and sore throat.   Respiratory: Positive for cough and shortness of breath. Negative for hemoptysis and wheezing.   Cardiovascular: Negative for chest pain.  Gastrointestinal: Negative for heartburn.  Musculoskeletal: Negative for myalgias.  Skin: Negative for rash.  Neurological: Negative for headaches.    Per HPI unless specifically indicated above     Objective:    BP 112/66   Pulse 68   Temp 98.4 F (36.9 C) (Oral)   Wt 212 lb 9.6 oz (96.4 kg)   SpO2 99%   BMI 32.33 kg/m   Wt Readings from Last 3 Encounters:  03/07/17 212 lb 9.6 oz (96.4 kg)  02/22/17 211 lb (95.7 kg)  02/03/17 212 lb 12.8 oz (96.5 kg)    Physical Exam  Constitutional: She is oriented to person, place, and time. She appears well-developed and well-nourished. No distress.  HENT:  Head: Normocephalic and atraumatic.  Eyes: Conjunctivae and lids are normal. Right eye exhibits no discharge. Left eye exhibits no discharge. No scleral icterus.  Neck: Normal range of motion. Neck supple. No JVD present. Carotid bruit is not present.  Cardiovascular: Normal rate, regular rhythm and normal heart sounds.  Pulmonary/Chest: Effort normal  and breath sounds normal.  Abdominal: Normal appearance. There is no splenomegaly or hepatomegaly.  Musculoskeletal: Normal range of motion.  Neurological: She is alert and oriented to person, place, and time.  Skin: Skin is warm, dry and intact. No rash noted. No pallor.  Psychiatric: She has a normal mood and affect. Her behavior is normal. Judgment and thought content normal.    Results for orders placed or performed in visit on 12/07/16  Urine drugs of abuse scrn w alc, routine (Ref Lab)  Result Value Ref Range   Amphetamines, Urine Negative Cutoff=1000 ng/mL   Barbiturate Quant, Ur Negative Cutoff=300 ng/mL   Benzodiazepine Quant, Ur Negative Cutoff=300 ng/mL   Cannabinoid Quant, Ur Negative Cutoff=50 ng/mL    Cocaine (Metab.) Negative Cutoff=300 ng/mL   Opiate Quant, Ur Negative Cutoff=300 ng/mL   PCP Quant, Ur Negative Cutoff=25 ng/mL   Methadone Screen, Urine Negative Cutoff=300 ng/mL   Propoxyphene Negative Cutoff=300 ng/mL   Ethanol, Urine Negative Cutoff=0.020 %       Assessment & Plan:   Problem List Items Addressed This Visit      Unprioritized   Severe depression (HCC)    Updated list.  Only on Fluoxetine and is stable (see PHQ-9).  Monitor on regular basis       Other Visit Diagnoses    Cough    -  Primary   Worsening cough.  Encouraged to get her x-ray.  No help with prednesone or Tessalon.  Not on ACE.  Doxycyline to r/o sinusitis or atypical       Follow up plan: Return if symptoms worsen or fail to improve.

## 2017-03-07 NOTE — Assessment & Plan Note (Signed)
Updated list.  Only on Fluoxetine and is stable (see PHQ-9).  Monitor on regular basis

## 2017-05-21 ENCOUNTER — Other Ambulatory Visit: Payer: Self-pay | Admitting: Unknown Physician Specialty

## 2017-06-06 ENCOUNTER — Other Ambulatory Visit: Payer: Self-pay | Admitting: Unknown Physician Specialty

## 2017-08-15 ENCOUNTER — Telehealth: Payer: Self-pay | Admitting: Unknown Physician Specialty

## 2017-08-15 DIAGNOSIS — Z1211 Encounter for screening for malignant neoplasm of colon: Secondary | ICD-10-CM

## 2017-08-15 NOTE — Telephone Encounter (Signed)
Copied from CRM [id number]. Topic: Quick Communication - See Telephone Encounter >> Aug 15, 2017 10:10 AM Arlyss Gandyichardson, Irlene Crudup N, NT wrote: CRM for notification. See Telephone encounter for: 08/15/17. Pt calling and states she needs an order put in to have a colonoscopy. Please call pt when order is in with the location as well.

## 2017-08-16 ENCOUNTER — Other Ambulatory Visit: Payer: Self-pay | Admitting: Unknown Physician Specialty

## 2017-08-17 ENCOUNTER — Telehealth: Payer: Self-pay | Admitting: Gastroenterology

## 2017-08-17 NOTE — Telephone Encounter (Signed)
Patient LVM to schedule a colonoscopy. Please call °

## 2017-08-21 ENCOUNTER — Other Ambulatory Visit: Payer: Self-pay | Admitting: Unknown Physician Specialty

## 2017-08-22 ENCOUNTER — Other Ambulatory Visit: Payer: Self-pay

## 2017-08-22 DIAGNOSIS — Z1211 Encounter for screening for malignant neoplasm of colon: Secondary | ICD-10-CM

## 2017-08-23 ENCOUNTER — Telehealth: Payer: Self-pay | Admitting: Unknown Physician Specialty

## 2017-08-23 NOTE — Telephone Encounter (Signed)
Copied from CRM [phone]. Topic: Quick Communication - See Telephone Encounter >> Aug 23, 2017  2:31 PM Tamela OddiMartin, Don'Quashia, NT wrote: CRM for notification. See Telephone encounter for: 08/23/17. Patient called and states she needs a refill of her QUEtiapine Fumarate (SEROQUEL XR) 150 MG 24 hr tablet . This med is not on her current med list.   [facility] [address] - [place] ([place]), [place] - [address] [phone] (Phone) [phone] (Fax)

## 2017-08-24 NOTE — Telephone Encounter (Signed)
I don't see that she's on this medicine. Needs a follow up

## 2017-08-24 NOTE — Telephone Encounter (Signed)
Pt has been schedule with lane tomorrow

## 2017-08-24 NOTE — Telephone Encounter (Signed)
LOV 03/07/17 Gabriel Cirriheryl Wicker Seroquel mentioned in provider's note, but not on medication list

## 2017-08-25 ENCOUNTER — Encounter: Payer: Self-pay | Admitting: Family Medicine

## 2017-08-25 ENCOUNTER — Ambulatory Visit (INDEPENDENT_AMBULATORY_CARE_PROVIDER_SITE_OTHER): Payer: BLUE CROSS/BLUE SHIELD | Admitting: Family Medicine

## 2017-08-25 ENCOUNTER — Other Ambulatory Visit: Payer: Self-pay

## 2017-08-25 ENCOUNTER — Other Ambulatory Visit: Payer: Self-pay | Admitting: Family Medicine

## 2017-08-25 VITALS — BP 117/67 | HR 83 | Temp 99.2°F | Ht 68.0 in | Wt 207.0 lb

## 2017-08-25 DIAGNOSIS — E038 Other specified hypothyroidism: Secondary | ICD-10-CM | POA: Diagnosis not present

## 2017-08-25 DIAGNOSIS — E785 Hyperlipidemia, unspecified: Secondary | ICD-10-CM | POA: Diagnosis not present

## 2017-08-25 DIAGNOSIS — F332 Major depressive disorder, recurrent severe without psychotic features: Secondary | ICD-10-CM

## 2017-08-25 MED ORDER — QUETIAPINE FUMARATE ER 150 MG PO TB24: 150.0000 mg | ORAL_TABLET | Freq: Every day | ORAL | 1 refills | Status: DC

## 2017-08-25 NOTE — Progress Notes (Signed)
BP 117/67   Pulse 83   Temp 99.2 F (37.3 C) (Oral)   Ht 5\' 8"  (1.727 m)   Wt 207 lb (93.9 kg)   SpO2 95%   BMI 31.47 kg/m    Subjective:    Patient ID: Kayla Wright, female    DOB: 09/27/1955, 62 y.o.   MRN: [id number]  HPI: Kayla Wright is a 62 y.o. female  Chief Complaint  Patient presents with  . Medication Refill    quetiapine   Pt here today for depression f/u. Taking seroquel with good control of moods and sleep. Denies SI/HI, sleep or appetite issues, severe mood swings.  Also due for thyroid and cholesterol f/u. Taking medications compliantly without side effects. No CP, SOB, palpitations, claudication.   [facility] 08/25/2017 03/07/2017 02/03/2017  Decreased Interest 1 1 1   Down, Depressed, Hopeless 1 2 1   PHQ - 2 Score 2 3 2   Altered sleeping 2 0 1  Tired, decreased energy 3 1 1   Change in appetite 2 0 0  Feeling bad or failure about yourself  2 1 2   Trouble concentrating 2 0 1  Moving slowly or fidgety/restless 0 0 0  Suicidal thoughts 0 0 1  PHQ-9 Score 13 5 8   Some recent data might be hidden    Past Medical History:  Diagnosis Date  . Allergy   . Common migraine   . Depression   . GERD (gastroesophageal reflux disease)   . Hyperlipidemia   . Hypertension   . Insomnia   . Menopausal symptom   . Osteopenia   . Thyroid disease     Relevant past medical, surgical, family and social history reviewed and updated as indicated. Interim medical history since our last visit reviewed. Allergies and medications reviewed and updated.  Review of Systems  Per HPI unless specifically indicated above     Objective:    BP 117/67   Pulse 83   Temp 99.2 F (37.3 C) (Oral)   Ht 5\' 8"  (1.727 m)   Wt 207 lb (93.9 kg)   SpO2 95%   BMI 31.47 kg/m   Wt Readings from Last 3 Encounters:  08/25/17 207 lb (93.9 kg)  03/07/17 212 lb 9.6 oz (96.4 kg)  02/22/17 211 lb (95.7 kg)    Physical Exam  Constitutional: She is oriented to person,  place, and time. She appears well-developed and well-nourished. No distress.  HENT:  Head: Atraumatic.  Eyes: Conjunctivae and EOM are normal.  Neck: Neck supple.  Cardiovascular: Normal rate and regular rhythm.  Pulmonary/Chest: Effort normal and breath sounds normal.  Musculoskeletal: Normal range of motion.  Neurological: She is alert and oriented to person, place, and time.  Skin: Skin is warm and dry.  Psychiatric: She has a normal mood and affect. Her behavior is normal.  Nursing note and vitals reviewed.   Results for orders placed or performed in visit on 08/25/17  Comprehensive metabolic panel  Result Value Ref Range   Glucose 109 (H) 65 - 99 mg/dL   BUN 12 8 - 27 mg/dL   Creatinine, Ser 7.821.12 (H) 0.57 - 1.00 mg/dL   GFR calc non Af Amer 53 (L) >59 mL/min/1.73   GFR calc Af Amer 61 >59 mL/min/1.73   BUN/Creatinine Ratio 11 (L) 12 - 28   Sodium 140 134 - 144 mmol/L   Potassium 4.2 3.5 - 5.2 mmol/L   Chloride 100 96 - 106 mmol/L   CO2 26 20 -  29 mmol/L   Calcium 9.2 8.7 - 10.3 mg/dL   Total Protein 6.6 6.0 - 8.5 g/dL   Albumin 4.3 3.6 - 4.8 g/dL   Globulin, Total 2.3 1.5 - 4.5 g/dL   Albumin/Globulin Ratio 1.9 1.2 - 2.2   Bilirubin Total 0.8 0.0 - 1.2 mg/dL   Alkaline Phosphatase 85 39 - 117 IU/L   AST 20 0 - 40 IU/L   ALT 19 0 - 32 IU/L  Lipid Panel w/o Chol/HDL Ratio  Result Value Ref Range   Cholesterol, Total 172 100 - 199 mg/dL   Triglycerides 960 (H) 0 - 149 mg/dL   HDL 40 >45 mg/dL   VLDL Cholesterol Cal 41 (H) 5 - 40 mg/dL   LDL Calculated 91 0 - 99 mg/dL  TSH  Result Value Ref Range   TSH 3.900 0.450 - 4.500 uIU/mL      Assessment & Plan:   Problem List Items Addressed This Visit      Endocrine   Hypothyroidism, unspecified   Relevant Orders   TSH (Completed)     Other   Hyperlipidemia, unspecified - Primary    Will check lipids, continue current regimen. Lifestyle modifications reviewed      Relevant Orders   Comprehensive metabolic panel  (Completed)   Lipid Panel w/o Chol/HDL Ratio (Completed)   Major depression, recurrent (HCC)    Stable on seroquel. Continue current regimen          Follow up plan: Return in about 6 months (around 02/25/2018) for CPE.

## 2017-08-26 LAB — COMPREHENSIVE METABOLIC PANEL
ALT: 19 IU/L (ref 0–32)
AST: 20 IU/L (ref 0–40)
Albumin/Globulin Ratio: 1.9 (ref 1.2–2.2)
Albumin: 4.3 g/dL (ref 3.6–4.8)
Alkaline Phosphatase: 85 IU/L (ref 39–117)
BUN/Creatinine Ratio: 11 — ABNORMAL LOW (ref 12–28)
BUN: 12 mg/dL (ref 8–27)
Bilirubin Total: 0.8 mg/dL (ref 0.0–1.2)
CO2: 26 mmol/L (ref 20–29)
Calcium: 9.2 mg/dL (ref 8.7–10.3)
Chloride: 100 mmol/L (ref 96–106)
Creatinine, Ser: 1.12 mg/dL — ABNORMAL HIGH (ref 0.57–1.00)
GFR calc Af Amer: 61 mL/min/{1.73_m2} (ref >59)
GFR calc non Af Amer: 53 mL/min/{1.73_m2} — ABNORMAL LOW (ref >59)
Globulin, Total: 2.3 g/dL (ref 1.5–4.5)
Glucose: 109 mg/dL — ABNORMAL HIGH (ref 65–99)
Potassium: 4.2 mmol/L (ref 3.5–5.2)
Sodium: 140 mmol/L (ref 134–144)
Total Protein: 6.6 g/dL (ref 6.0–8.5)

## 2017-08-26 LAB — LIPID PANEL W/O CHOL/HDL RATIO
Cholesterol, Total: 172 mg/dL (ref 100–199)
HDL: 40 mg/dL (ref >39)
LDL Calculated: 91 mg/dL (ref 0–99)
Triglycerides: 204 mg/dL — ABNORMAL HIGH (ref 0–149)
VLDL Cholesterol Cal: 41 mg/dL — ABNORMAL HIGH (ref 5–40)

## 2017-08-26 LAB — TSH: TSH: 3.9 u[IU]/mL (ref 0.450–4.500)

## 2017-08-27 NOTE — Patient Instructions (Signed)
Follow up for CPE in 6 months 

## 2017-08-27 NOTE — Assessment & Plan Note (Signed)
Will check lipids, continue current regimen. Lifestyle modifications reviewed

## 2017-08-27 NOTE — Assessment & Plan Note (Signed)
Stable on seroquel. Continue current regimen

## 2017-08-28 ENCOUNTER — Encounter: Payer: Self-pay | Admitting: Family Medicine

## 2017-08-29 ENCOUNTER — Other Ambulatory Visit: Payer: Self-pay | Admitting: Family Medicine

## 2017-08-29 NOTE — Telephone Encounter (Signed)
Fluoxetine (Prozac) refill Last Refill:02/13/17 # 90 1 RF Note stated there was a change in therapy.  Last OV: 08/25/17 PCP: Nettie ElmAdriana Pollack PA Pharmacy:[facility] [address]   Note read " Major depression, recurrent (HCC)     Stable on seroquel. Continue current regimen      Routing refill request back to provider for review.

## 2017-08-30 ENCOUNTER — Encounter: Payer: Self-pay | Admitting: Student

## 2017-08-31 ENCOUNTER — Encounter: Payer: Self-pay | Admitting: *Deleted

## 2017-08-31 ENCOUNTER — Ambulatory Visit: Payer: BLUE CROSS/BLUE SHIELD | Admitting: Anesthesiology

## 2017-08-31 ENCOUNTER — Ambulatory Visit
Admission: RE | Admit: 2017-08-31 | Discharge: 2017-08-31 | Disposition: A | Discharge status: Home / Self Care | Payer: BLUE CROSS/BLUE SHIELD | Source: Ambulatory Visit | Attending: Gastroenterology | Admitting: Gastroenterology

## 2017-08-31 ENCOUNTER — Other Ambulatory Visit: Payer: Self-pay

## 2017-08-31 ENCOUNTER — Encounter
Admission: RE | Disposition: A | Discharge status: Home / Self Care | Payer: Self-pay | Source: Ambulatory Visit | Attending: Gastroenterology

## 2017-08-31 DIAGNOSIS — Z79891 Long term (current) use of opiate analgesic: Secondary | ICD-10-CM | POA: Diagnosis not present

## 2017-08-31 DIAGNOSIS — I10 Essential (primary) hypertension: Secondary | ICD-10-CM | POA: Diagnosis not present

## 2017-08-31 DIAGNOSIS — Z8249 Family history of ischemic heart disease and other diseases of the circulatory system: Secondary | ICD-10-CM | POA: Insufficient documentation

## 2017-08-31 DIAGNOSIS — E785 Hyperlipidemia, unspecified: Secondary | ICD-10-CM | POA: Diagnosis not present

## 2017-08-31 DIAGNOSIS — Z79899 Other long term (current) drug therapy: Secondary | ICD-10-CM | POA: Insufficient documentation

## 2017-08-31 DIAGNOSIS — Z79890 Hormone replacement therapy (postmenopausal): Secondary | ICD-10-CM | POA: Insufficient documentation

## 2017-08-31 DIAGNOSIS — G43009 Migraine without aura, not intractable, without status migrainosus: Secondary | ICD-10-CM | POA: Diagnosis not present

## 2017-08-31 DIAGNOSIS — K219 Gastro-esophageal reflux disease without esophagitis: Secondary | ICD-10-CM | POA: Diagnosis not present

## 2017-08-31 DIAGNOSIS — Z1211 Encounter for screening for malignant neoplasm of colon: Secondary | ICD-10-CM | POA: Insufficient documentation

## 2017-08-31 DIAGNOSIS — F419 Anxiety disorder, unspecified: Secondary | ICD-10-CM | POA: Insufficient documentation

## 2017-08-31 DIAGNOSIS — F329 Major depressive disorder, single episode, unspecified: Secondary | ICD-10-CM | POA: Diagnosis not present

## 2017-08-31 DIAGNOSIS — E039 Hypothyroidism, unspecified: Secondary | ICD-10-CM | POA: Diagnosis not present

## 2017-08-31 DIAGNOSIS — K648 Other hemorrhoids: Secondary | ICD-10-CM | POA: Diagnosis not present

## 2017-08-31 DIAGNOSIS — Z791 Long term (current) use of non-steroidal anti-inflammatories (NSAID): Secondary | ICD-10-CM | POA: Diagnosis not present

## 2017-08-31 HISTORY — PX: COLONOSCOPY WITH PROPOFOL: SHX5780

## 2017-08-31 LAB — URINE DRUG SCREEN, QUALITATIVE (ARMC ONLY)
Amphetamines, Ur Screen: NOT DETECTED
Barbiturates, Ur Screen: NOT DETECTED
Cannabinoid 50 Ng, Ur ~~LOC~~: NOT DETECTED
Cocaine Metabolite,Ur ~~LOC~~: NOT DETECTED
MDMA (Ecstasy)Ur Screen: NOT DETECTED
Methadone Scn, Ur: NOT DETECTED
Opiate, Ur Screen: NOT DETECTED
Phencyclidine (PCP) Ur S: NOT DETECTED
Tricyclic, Ur Screen: NOT DETECTED

## 2017-08-31 SURGERY — COLONOSCOPY WITH PROPOFOL
Anesthesia: General

## 2017-08-31 MED ORDER — SODIUM CHLORIDE 0.9 % IV SOLN
INTRAVENOUS | Status: DC
  Administered 2017-08-31: 1000 mL via INTRAVENOUS

## 2017-08-31 MED ORDER — FENTANYL CITRATE (PF) 100 MCG/2ML IJ SOLN
INTRAMUSCULAR | Status: AC
  Filled 2017-08-31: qty 2

## 2017-08-31 MED ORDER — MIDAZOLAM HCL 2 MG/2ML IJ SOLN
INTRAMUSCULAR | Status: DC | PRN
  Administered 2017-08-31: 2 mg via INTRAVENOUS

## 2017-08-31 MED ORDER — PROPOFOL 500 MG/50ML IV EMUL
INTRAVENOUS | Status: AC
  Filled 2017-08-31: qty 50

## 2017-08-31 MED ORDER — MIDAZOLAM HCL 2 MG/2ML IJ SOLN
INTRAMUSCULAR | Status: AC
  Filled 2017-08-31: qty 2

## 2017-08-31 MED ORDER — FENTANYL CITRATE (PF) 100 MCG/2ML IJ SOLN
INTRAMUSCULAR | Status: DC | PRN
  Administered 2017-08-31 (×2): 50 ug via INTRAVENOUS

## 2017-08-31 MED ORDER — PROPOFOL 500 MG/50ML IV EMUL
INTRAVENOUS | Status: DC | PRN
  Administered 2017-08-31: 120 ug/kg/min via INTRAVENOUS

## 2017-08-31 NOTE — Anesthesia Post-op Follow-up Note (Signed)
Anesthesia QCDR form completed.        

## 2017-08-31 NOTE — Transfer of Care (Signed)
Immediate Anesthesia Transfer of Care Note  Patient: Kayla Wright  Procedure(s) Performed: COLONOSCOPY WITH PROPOFOL (N/A )  Patient Location: PACU  Anesthesia Type:General  Level of Consciousness: awake and sedated  Airway & Oxygen Therapy: Patient Spontanous Breathing and Patient connected to nasal cannula oxygen  Post-op Assessment: Report given to RN and Post -op Vital signs reviewed and stable  Post vital signs: Reviewed and stable  Last Vitals:  Vitals Value Taken Time  BP    Temp    Pulse    Resp    SpO2      Last Pain:  Vitals:   08/31/17 0859  TempSrc: Tympanic  PainSc: 0-No pain         Complications: No apparent anesthesia complications

## 2017-08-31 NOTE — H&P (Signed)
Arlyss Repress, MD [address]  [address]  [place], [place] [address]  Main: [phone]  Fax: [phone] Pager: [phone]  Primary Care Physician:  Gabriel Cirri, NP Primary Gastroenterologist:  Dr. Arlyss Repress  Pre-Procedure History & Physical: HPI:  Kayla Wright is a 62 y.o. female is here for an colonoscopy.   Past Medical History:  Diagnosis Date  . Allergy   . Common migraine   . Depression   . GERD (gastroesophageal reflux disease)   . Hyperlipidemia   . Hypertension   . Insomnia   . Menopausal symptom   . Osteopenia   . Thyroid disease     Past Surgical History:  Procedure Laterality Date  . ANKLE FRACTURE SURGERY  06/18/15  . TUBAL LIGATION      Prior to Admission medications   Medication Sig Start Date End Date Taking? Authorizing Provider  albuterol (PROVENTIL HFA;VENTOLIN HFA) 108 (90 Base) MCG/ACT inhaler Inhale 2 puffs into the lungs every 6 (six) hours as needed for wheezing or shortness of breath. 08/09/16   Gabriel Cirri, NP  Ascorbic Acid (VITAMIN C) 1000 MG tablet Take 1,000 mg by mouth daily.    [provider]  Cholecalciferol (VITAMIN D3) 5000 units CAPS Take 5,000 Units by mouth daily.    [provider]  Cyanocobalamin (VITAMIN B-12 PO) Take 1 tablet by mouth daily.    [provider]  diclofenac sodium (VOLTAREN) 1 % GEL Apply 1 application topically daily. 08/25/16   [provider]  FLUoxetine (PROZAC) 40 MG capsule TAKE 1 CAPSULE BY MOUTH ONCE DAILY 08/30/17   Trey Sailors, PA-C  levothyroxine (SYNTHROID, LEVOTHROID) 50 MCG tablet TAKE ONE TABLET BY MOUTH ONCE DAILY BEFORE BREAKFAST 06/06/17   Gabriel Cirri, NP  oxycodone-acetaminophen (PERCOCET) 2.5-325 MG tablet Take 1 tablet by mouth every 4 (four) hours as needed for pain.    [provider]  oxyCODONE-acetaminophen (PERCOCET/ROXICET) 5-325 MG tablet Take by mouth every 4 (four) hours as needed for severe pain.    [provider]  QUEtiapine Fumarate (SEROQUEL XR) 150 MG 24 hr tablet Take 1 tablet (150 mg total) by mouth at bedtime. 08/25/17   Particia Nearing, PA-C  simvastatin (ZOCOR) 40 MG tablet TAKE 1 TABLET BY MOUTH ONCE DAILY 05/22/17   Gabriel Cirri, NP  SUMAtriptan (IMITREX) 50 MG tablet Take 50 mg by mouth every 2 (two) hours as needed for migraine. May repeat in 2 hours if headache persists or recurs.    [provider]    Allergies as of 08/23/2017  . (No Known Allergies)    Family History  Problem Relation Age of Onset  . Diabetes Mother   . Heart disease Mother   . Cirrhosis Mother   . Heart disease Father     Social History   Socioeconomic History  . Marital status: Divorced    Spouse name: Not on file  . Number of children: Not on file  . Years of education: Not on file  . Highest education level: Not on file  Occupational History  . Not on file  Social Needs  . Financial resource strain: Not on file  . Food insecurity:    Worry: Not on file    Inability: Not on file  . Transportation needs:    Medical: Not on file    Non-medical: Not on file  Tobacco Use  . Smoking status: Never Smoker  . Smokeless tobacco: Never Used  Substance and Sexual Activity  .  Alcohol use: No    Alcohol/week: 0.0 standard drinks  . Drug use: No  . Sexual activity: Never    Birth control/protection: Surgical  Lifestyle  . Physical activity:    Days per week: Not on file    Minutes per session: Not on file  . Stress: Not on file  Relationships  . Social connections:    Talks on phone: Not on file    Gets together: Not on file    Attends religious service: Not on file    Active member of club or organization: Not on file    Attends meetings of clubs or organizations: Not on file    Relationship status: Not on file  . Intimate partner violence:    Fear of current or ex partner: Not on file    Emotionally abused: Not on file    Physically abused: Not on file     Forced sexual activity: Not on file  Other Topics Concern  . Not on file  Social History Narrative  . Not on file    Review of Systems: See HPI, otherwise negative ROS  Physical Exam: BP 119/70   Pulse 79   Temp 98.2 F (36.8 C) (Tympanic)   Resp 18   Ht 5\' 8"  (1.727 m)   Wt 92.1 kg   SpO2 96%   BMI 30.87 kg/m  General:   Alert,  pleasant and cooperative in NAD Head:  Normocephalic and atraumatic. Neck:  Supple; no masses or thyromegaly. Lungs:  Clear throughout to auscultation.    Heart:  Regular rate and rhythm. Abdomen:  Soft, nontender and nondistended. Normal bowel sounds, without guarding, and without rebound.   Neurologic:  Alert and  oriented x4;  grossly normal neurologically.  Impression/Plan: Kayla Wright is here for an colonoscopy to be performed for colon cancer screening  Risks, benefits, limitations, and alternatives regarding  colonoscopy have been reviewed with the patient.  Questions have been answered.  All parties agreeable.   Lannette Donathohini Vanga, MD  08/31/2017, 9:39 AM

## 2017-08-31 NOTE — Anesthesia Preprocedure Evaluation (Signed)
Anesthesia Evaluation  Patient identified by MRN, date of birth, ID band Patient awake    Reviewed: Allergy & Precautions, H&P , NPO status , Patient's Chart, lab work & pertinent test results, reviewed documented beta blocker date and time   History of Anesthesia Complications Negative for: history of anesthetic complications  Airway Mallampati: I  TM Distance: >3 FB Neck ROM: full    Dental  (+) Dental Advidsory Given, Partial Upper, Missing, Poor Dentition   Pulmonary shortness of breath and with exertion, asthma , sleep apnea and Continuous Positive Airway Pressure Ventilation , neg COPD, neg recent URI,    Pulmonary exam normal        Cardiovascular Exercise Tolerance: Good hypertension, (-) angina(-) CAD, (-) Past MI, (-) Cardiac Stents and (-) CABG (-) dysrhythmias + Valvular Problems/Murmurs      Neuro/Psych  Headaches, neg Seizures PSYCHIATRIC DISORDERS Anxiety Depression negative neurological ROS     GI/Hepatic Neg liver ROS, GERD  ,  Endo/Other  neg diabetesHypothyroidism   Renal/GU negative Renal ROS  negative genitourinary   Musculoskeletal   Abdominal   Peds  Hematology negative hematology ROS (+)   Anesthesia Other Findings Past Medical History: No date: Allergy No date: Common migraine No date: Depression No date: GERD (gastroesophageal reflux disease) No date: Hyperlipidemia No date: Hypertension No date: Insomnia No date: Menopausal symptom No date: Osteopenia No date: Thyroid disease   Reproductive/Obstetrics negative OB ROS                             Anesthesia Physical Anesthesia Plan  ASA: III  Anesthesia Plan: General   Post-op Pain Management:    Induction: Intravenous  PONV Risk Score and Plan: 3 and Propofol infusion and TIVA  Airway Management Planned: Nasal Cannula  Additional Equipment:   Intra-op Plan:   Post-operative Plan:   Informed  Consent: I have reviewed the patients History and Physical, chart, labs and discussed the procedure including the risks, benefits and alternatives for the proposed anesthesia with the patient or authorized representative who has indicated his/her understanding and acceptance.   Dental Advisory Given  Plan Discussed with: Anesthesiologist, CRNA and Surgeon  Anesthesia Plan Comments:         Anesthesia Quick Evaluation

## 2017-08-31 NOTE — Anesthesia Procedure Notes (Signed)
Performed by: Cook-Martin, Mikalah Skyles Pre-anesthesia Checklist: Patient identified, Emergency Drugs available, Suction available, Patient being monitored and Timeout performed Patient Re-evaluated:Patient Re-evaluated prior to induction Oxygen Delivery Method: Nasal cannula Preoxygenation: Pre-oxygenation with 100% oxygen Induction Type: IV induction Placement Confirmation: positive ETCO2 and CO2 detector       

## 2017-08-31 NOTE — Anesthesia Postprocedure Evaluation (Signed)
Anesthesia Post Note  Patient: Kayla Wright  Procedure(s) Performed: COLONOSCOPY WITH PROPOFOL (N/A )  Patient location during evaluation: Endoscopy Anesthesia Type: General Level of consciousness: awake and alert Pain management: pain level controlled Vital Signs Assessment: post-procedure vital signs reviewed and stable Respiratory status: spontaneous breathing, nonlabored ventilation, respiratory function stable and patient connected to nasal cannula oxygen Cardiovascular status: blood pressure returned to baseline and stable Postop Assessment: no apparent nausea or vomiting Anesthetic complications: no     Last Vitals:  Vitals:   08/31/17 1119 08/31/17 1129  BP: 115/74   Pulse: 67 65  Resp: 13 (!) 9  Temp:    SpO2: 99% 100%    Last Pain:  Vitals:   08/31/17 1129  TempSrc:   PainSc: 0-No pain                 Lenard SimmerAndrew Daichi Moris

## 2017-08-31 NOTE — Op Note (Signed)
[facility] Gastroenterology Patient Name: Kayla Wright Procedure Date: 08/31/2017 10:24 AM MRN: [id number] Account #: [id number] Date of Birth: 01-08-1956 Admit Type: Outpatient Age: 62 Room: [facility] ENDO ROOM 4 Gender: Female Note Status: Finalized Procedure:            Colonoscopy Indications:          Screening for colorectal malignant neoplasm, This is                        the patient's first colonoscopy Providers:            Lin Landsman MD, MD Referring MD:         Kathrine Haddock (Referring MD) Medicines:            Monitored Anesthesia Care Complications:        No immediate complications. Estimated blood loss: None. Procedure:            Pre-Anesthesia Assessment:                       - Prior to the procedure, a History and Physical was                        performed, and patient medications and allergies were                        reviewed. The patient is competent. The risks and                        benefits of the procedure and the sedation options and                        risks were discussed with the patient. All questions                        were answered and informed consent was obtained.                        Patient identification and proposed procedure were                        verified by the physician, the nurse, the                        anesthesiologist, the anesthetist and the technician in                        the pre-procedure area in the procedure room in the                        endoscopy suite. Mental Status Examination: alert and                        oriented. Airway Examination: normal oropharyngeal                        airway and neck mobility. Respiratory Examination:                        clear to auscultation. CV Examination: normal.  Prophylactic Antibiotics: The patient does not require                        prophylactic antibiotics. Prior Anticoagulants: The                         patient has taken no previous anticoagulant or                        antiplatelet agents. ASA Grade Assessment: III - A                        patient with severe systemic disease. After reviewing                        the risks and benefits, the patient was deemed in                        satisfactory condition to undergo the procedure. The                        anesthesia plan was to use monitored anesthesia care                        (MAC). Immediately prior to administration of                        medications, the patient was re-assessed for adequacy                        to receive sedatives. The heart rate, respiratory rate,                        oxygen saturations, blood pressure, adequacy of                        pulmonary ventilation, and response to care were                        monitored throughout the procedure. The physical status                        of the patient was re-assessed after the procedure.                       After obtaining informed consent, the colonoscope was                        passed under direct vision. Throughout the procedure,                        the patient's blood pressure, pulse, and oxygen                        saturations were monitored continuously. The                        Colonoscope was introduced through the anus and  advanced to the the cecum, identified by appendiceal                        orifice and ileocecal valve. The colonoscopy was                        somewhat difficult due to significant looping.                        Successful completion of the procedure was aided by                        applying abdominal pressure. The patient tolerated the                        procedure well. The quality of the bowel preparation                        was evaluated using the BBPS [facility] Bowel Preparation                        Scale) with scores of: Right Colon = 3, Transverse                         Colon = 3 and Left Colon = 3 (entire mucosa seen well                        with no residual staining, small fragments of stool or                        opaque liquid). The total BBPS score equals 9. Findings:      The perianal and digital rectal examinations were normal. Pertinent       negatives include normal sphincter tone and no palpable rectal lesions.      The colon (entire examined portion) appeared normal.      Internal hemorrhoids were found during retroflexion. The hemorrhoids       were medium-sized. Impression:           - The entire examined colon is normal.                       - Internal hemorrhoids.                       - No specimens collected. Recommendation:       - Discharge patient to home (with escort).                       - Low fat diet and low sodium diet.                       - Continue present medications.                       - Repeat colonoscopy in 10 years for surveillance. Procedure Code(s):    --- Professional ---                       D3570, Colorectal cancer screening; colonoscopy on  individual not meeting criteria for high risk Diagnosis Code(s):    --- Professional ---                       Z12.11, Encounter for screening for malignant neoplasm                        of colon                       K64.8, Other hemorrhoids CPT copyright 2017 American Medical Association. All rights reserved. The codes documented in this report are preliminary and upon coder review may  be revised to meet current compliance requirements. Dr. Ulyess Mort Lin Landsman MD, MD 08/31/2017 10:54:10 AM This report has been signed electronically. Number of Addenda: 0 Note Initiated On: 08/31/2017 10:24 AM Scope Withdrawal Time: 0 hours 8 minutes 33 seconds  Total Procedure Duration: 0 hours 13 minutes 16 seconds       [facility]

## 2017-09-18 ENCOUNTER — Encounter: Payer: Self-pay | Admitting: Emergency Medicine

## 2017-09-18 ENCOUNTER — Emergency Department
Admission: EM | Admit: 2017-09-18 | Discharge: 2017-09-18 | Disposition: A | Discharge status: Home / Self Care | Payer: BLUE CROSS/BLUE SHIELD | Attending: Emergency Medicine | Admitting: Emergency Medicine

## 2017-09-18 ENCOUNTER — Emergency Department: Payer: BLUE CROSS/BLUE SHIELD

## 2017-09-18 ENCOUNTER — Ambulatory Visit: Payer: Self-pay | Admitting: *Deleted

## 2017-09-18 DIAGNOSIS — R079 Chest pain, unspecified: Secondary | ICD-10-CM | POA: Diagnosis not present

## 2017-09-18 DIAGNOSIS — E039 Hypothyroidism, unspecified: Secondary | ICD-10-CM | POA: Diagnosis not present

## 2017-09-18 DIAGNOSIS — I10 Essential (primary) hypertension: Secondary | ICD-10-CM | POA: Diagnosis not present

## 2017-09-18 DIAGNOSIS — Z79899 Other long term (current) drug therapy: Secondary | ICD-10-CM | POA: Insufficient documentation

## 2017-09-18 LAB — URINE DRUG SCREEN, QUALITATIVE (ARMC ONLY)
Amphetamines, Ur Screen: NOT DETECTED
Barbiturates, Ur Screen: NOT DETECTED
Cannabinoid 50 Ng, Ur ~~LOC~~: NOT DETECTED
Cocaine Metabolite,Ur ~~LOC~~: NOT DETECTED
MDMA (Ecstasy)Ur Screen: NOT DETECTED
Methadone Scn, Ur: NOT DETECTED
Opiate, Ur Screen: NOT DETECTED
Phencyclidine (PCP) Ur S: NOT DETECTED
Tricyclic, Ur Screen: NOT DETECTED

## 2017-09-18 LAB — HEPATIC FUNCTION PANEL
ALT: 21 U/L (ref 0–44)
AST: 21 U/L (ref 15–41)
Albumin: 4.1 g/dL (ref 3.5–5.0)
Alkaline Phosphatase: 64 U/L (ref 38–126)
Bilirubin, Direct: 0.1 mg/dL (ref 0.0–0.2)
Indirect Bilirubin: 0.7 mg/dL (ref 0.3–0.9)
Total Bilirubin: 0.8 mg/dL (ref 0.3–1.2)
Total Protein: 7 g/dL (ref 6.5–8.1)

## 2017-09-18 LAB — TROPONIN I
Troponin I: <0.03 ng/mL (ref <0.03)
Troponin I: <0.03 ng/mL (ref <0.03)

## 2017-09-18 LAB — BASIC METABOLIC PANEL
Anion gap: 11 (ref 5–15)
BUN: 18 mg/dL (ref 8–23)
CO2: 26 mmol/L (ref 22–32)
Calcium: 9 mg/dL (ref 8.9–10.3)
Chloride: 103 mmol/L (ref 98–111)
Creatinine, Ser: 1.08 mg/dL — ABNORMAL HIGH (ref 0.44–1.00)
GFR calc Af Amer: >60 mL/min (ref >60)
GFR calc non Af Amer: 54 mL/min — ABNORMAL LOW (ref >60)
Glucose, Bld: 99 mg/dL (ref 70–99)
Potassium: 4.3 mmol/L (ref 3.5–5.1)
Sodium: 140 mmol/L (ref 135–145)

## 2017-09-18 LAB — CBC
HCT: 38.4 % (ref 35.0–47.0)
Hemoglobin: 13.2 g/dL (ref 12.0–16.0)
MCH: 31.6 pg (ref 26.0–34.0)
MCHC: 34.2 g/dL (ref 32.0–36.0)
MCV: 92.4 fL (ref 80.0–100.0)
Platelets: 242 10*3/uL (ref 150–440)
RBC: 4.16 MIL/uL (ref 3.80–5.20)
RDW: 13.9 % (ref 11.5–14.5)
WBC: 8.8 10*3/uL (ref 3.6–11.0)

## 2017-09-18 LAB — LIPASE, BLOOD: Lipase: 39 U/L (ref 11–51)

## 2017-09-18 MED ORDER — ALBUTEROL SULFATE (2.5 MG/3ML) 0.083% IN NEBU
INHALATION_SOLUTION | RESPIRATORY_TRACT | Status: AC
  Filled 2017-09-18: qty 3

## 2017-09-18 MED ORDER — ALBUTEROL SULFATE (2.5 MG/3ML) 0.083% IN NEBU
5.0000 mg | INHALATION_SOLUTION | Freq: Once | RESPIRATORY_TRACT | Status: AC
  Administered 2017-09-18: 5 mg via RESPIRATORY_TRACT
  Filled 2017-09-18: qty 6

## 2017-09-18 MED ORDER — ASPIRIN 81 MG PO CHEW
324.0000 mg | CHEWABLE_TABLET | Freq: Once | ORAL | Status: AC
  Administered 2017-09-18: 324 mg via ORAL
  Filled 2017-09-18: qty 4

## 2017-09-18 MED ORDER — ALBUTEROL SULFATE HFA 108 (90 BASE) MCG/ACT IN AERS: 2.0000 | INHALATION_SPRAY | Freq: Four times a day (QID) | RESPIRATORY_TRACT | 2 refills | Status: DC | PRN

## 2017-09-18 MED ORDER — SODIUM CHLORIDE 0.9 % IV BOLUS
1000.0000 mL | Freq: Once | INTRAVENOUS | Status: AC
  Administered 2017-09-18: 1000 mL via INTRAVENOUS

## 2017-09-18 NOTE — Telephone Encounter (Signed)
Pt called with having some shortness of breath when she walks a short distance. This started on Saturday and felt worst yesterday. She is fine, when she is sitting. Stated that she uses oxygen at night because her "heart stops". I asked her if she stops breathing at night and she said yes. She denies fever, nausea or vomiting, or headache. She does have some chest tightness when she gets short of breath. She states wheezing. No hx of cardiac or lung disease. She has an inhaler that she uses but it has not helped this time. No available appointments for today, advised pt to an Urgent Care to be assessed. Pt going to the closet one to the office. Advised to call back for any other concerns. Pt voiced understanding.  Reason for Disposition . [1] MILD difficulty breathing (e.g., minimal/no SOB at rest, SOB with walking, pulse <100) AND [2] NEW-onset or WORSE than normal  Answer Assessment - Initial Assessment Questions 1. RESPIRATORY STATUS: "Describe your breathing?" (e.g., wheezing, shortness of breath, unable to speak, severe coughing)      Shortness of breath, wheeziing 2. ONSET: "When did this breathing problem begin?"      A little on Saturday but got worse yesterday 3. PATTERN "Does the difficult breathing come and go, or has it been constant since it started?"      Comes and goes 4. SEVERITY: "How bad is your breathing?" (e.g., mild, moderate, severe)    - MILD: No SOB at rest, mild SOB with walking, speaks normally in sentences, can lay down, no retractions, pulse < 100.    - MODERATE: SOB at rest, SOB with minimal exertion and prefers to sit, cannot lie down flat, speaks in phrases, mild retractions, audible wheezing, pulse 100-120.    - SEVERE: Very SOB at rest, speaks in single words, struggling to breathe, sitting hunched forward, retractions, pulse > 120      mild 5. RECURRENT SYMPTOM: "Have you had difficulty breathing before?" If so, ask: "When was the last time?" and "What happened  that time?"      no 6. CARDIAC HISTORY: "Do you have any history of heart disease?" (e.g., heart attack, angina, bypass surgery, angioplasty)      no 7. LUNG HISTORY: "Do you have any history of lung disease?"  (e.g., pulmonary embolus, asthma, emphysema)     no 8. CAUSE: "What do you think is causing the breathing problem?"      Not sure what is causing this 9. OTHER SYMPTOMS: "Do you have any other symptoms? (e.g., dizziness, runny nose, cough, chest pain, fever)     Chest tightness, dizziness 10. PREGNANCY: "Is there any chance you are pregnant?" "When was your last menstrual period?"       No periods 11. TRAVEL: "Have you traveled out of the country in the last month?" (e.g., travel history, exposures)       no  Protocols used: BREATHING DIFFICULTY-A-AH

## 2017-09-18 NOTE — ED Triage Notes (Signed)
Patient presents to the ED with chest pain and shortness of breath since Saturday.  Patient states, "I thought it was indigestion but it's not going away."  Patient states chest pain is sharp pressure to epigastric/mid sternal area.  Patient states when she gets up to do things she becomes short of breath.

## 2017-09-18 NOTE — ED Provider Notes (Addendum)
[facility] Emergency Department Provider Note  ____________________________________________  Time seen: Approximately 6:57 PM  I have reviewed the triage vital signs and the nursing notes.   HISTORY  Chief Complaint Shortness of Breath and Chest Pain   HPI Kayla Wright is a 62 y.o. female with history of obstructive sleep apnea, COPD, chronic pain on opiates, hypertension, hyperlipidemia, hypothyroidism who presents for evaluation of chest pain.  Patient reports 2 days of intermittent chest pain that happens only with exertion.  She reports that the chest pain feels like a pressure in the center of her chest, moderate, constant and nonradiating and associated with shortness of breath.  Patient reports that the pain goes away when she sits down for 10 minutes.  She has had several episodes.  The pain is intermittent and does not happen at rest.  She denies ever having a heart attack or family history of heart attack, she has no history of smoking.  No personal family is of blood clots, recent travel immobilization, leg pain or swelling, hemoptysis, exogenous hormones.  Patient denies pain at this time.  She thought she was having indigestion but the symptoms were not improving with Alka-Seltzer. She has had a dry cough for 1 week.   Past Medical History:  Diagnosis Date  . Allergy   . Common migraine   . Depression   . GERD (gastroesophageal reflux disease)   . Hyperlipidemia   . Hypertension   . Insomnia   . Menopausal symptom   . Osteopenia   . Thyroid disease     Patient Active Problem List   Diagnosis Date Noted  . Fatigue due to depression 02/03/2017  . Cocaine use 12/06/2016  . Pain medication agreement broken 12/06/2016  . Osteoporosis of lumbar spine 11/09/2016  . Unspecified fracture of left talus, sequela (left ankle pain) 11/09/2016  . Neurogenic pain 11/09/2016  . Chronic bilateral low back pain with left-sided sciatica 11/09/2016  .  History of ankle fracture (Left) 10/11/2016  . Acute Herpes zoster without complication 10/11/2016  . DDD (degenerative disc disease), cervical (C5-6 and C6-7) 10/11/2016  . Chronic Cervical foraminal stenosis (C3-4) (Right) 10/11/2016  . Osteopenia of lumbar spine 10/11/2016  . DDD (degenerative disc disease), lumbar (L1-2 and L5-S1) 10/11/2016  . Shingles rash (Secondary Area of Pain) (Right) (Buttocks) 10/10/2016  . Chronic ankle pain (Primary Area of Pain) (Left) 10/09/2016  . Long term prescription benzodiazepine use 10/09/2016  . Chronic lower extremity pain (Left) 08/29/2016  . Chronic pain syndrome 08/29/2016  . Long term current use of opiate analgesic 08/29/2016  . Long term prescription opiate use 08/29/2016  . Opiate use 08/29/2016  . Obesity (BMI 30-39.9) 08/09/2016  . Asthma 08/09/2016  . Allergic rhinitis 06/17/2016  . Insomnia 11/02/2015  . Murmur 07/10/2015  . SOB (shortness of breath) on exertion 07/10/2015  . Severe depression (HCC) 10/21/2014  . Chronic anxiety 10/21/2014  . Hyperlipidemia, unspecified 10/21/2014  . Hypothyroidism, unspecified 10/21/2014  . Anxiety disorder, unspecified 10/21/2014  . Major depression, recurrent (HCC) 10/21/2014  . Sleep apnea 07/02/2014    Past Surgical History:  Procedure Laterality Date  . ANKLE FRACTURE SURGERY  06/18/15  . COLONOSCOPY WITH PROPOFOL N/A 08/31/2017   Procedure: COLONOSCOPY WITH PROPOFOL;  Surgeon: Toney Reil, MD;  Location: [facility] ENDOSCOPY;  Service: Gastroenterology;  Laterality: N/A;  . TUBAL LIGATION      Prior to Admission medications   Medication Sig Start Date End Date Taking? Authorizing Provider  Ascorbic  Acid (VITAMIN C) 1000 MG tablet Take 1,000 mg by mouth daily.   Yes [provider]  Cholecalciferol (VITAMIN D3) 5000 units CAPS Take 5,000 Units by mouth daily.   Yes [provider]  cloNIDine (CATAPRES) 0.2 MG tablet Take 1 tablet by mouth 3 (three) times daily.  09/11/17  Yes [provider]  Cyanocobalamin (VITAMIN B-12 PO) Take 1 tablet by mouth daily.   Yes [provider]  diclofenac sodium (VOLTAREN) 1 % GEL Apply 1 application topically daily. 08/25/16  Yes [provider]  levothyroxine (SYNTHROID, LEVOTHROID) 50 MCG tablet TAKE ONE TABLET BY MOUTH ONCE DAILY BEFORE BREAKFAST 06/06/17  Yes Gabriel CirriWicker, [name], NP  QUEtiapine Fumarate (SEROQUEL XR) 150 MG 24 hr tablet Take 1 tablet (150 mg total) by mouth at bedtime. 08/25/17  Yes Particia NearingLane, Rachel Elizabeth, PA-C  simvastatin (ZOCOR) 40 MG tablet TAKE 1 TABLET BY MOUTH ONCE DAILY 05/22/17  Yes Gabriel CirriWicker, [name], NP  albuterol (PROVENTIL HFA;VENTOLIN HFA) 108 (90 Base) MCG/ACT inhaler Inhale 2 puffs into the lungs every 6 (six) hours as needed for wheezing or shortness of breath. 09/18/17   Don PerkingVeronese, [place], MD  FLUoxetine (PROZAC) 40 MG capsule TAKE 1 CAPSULE BY MOUTH ONCE DAILY Patient not taking: Reported on 09/18/2017 08/30/17   Trey SailorsPollak, Adriana M, PA-C    Allergies Patient has no known allergies.  Family History  Problem Relation Age of Onset  . Diabetes Mother   . Heart disease Mother   . Cirrhosis Mother   . Heart disease Father     Social History Social History   Tobacco Use  . Smoking status: Never Smoker  . Smokeless tobacco: Never Used  Substance Use Topics  . Alcohol use: No    Alcohol/week: 0.0 standard drinks  . Drug use: No    Review of Systems  Constitutional: Negative for fever. Eyes: Negative for visual changes. ENT: Negative for sore throat. Neck: No neck pain  Cardiovascular: + chest pain. Respiratory: + shortness of breath and cough Gastrointestinal: Negative for abdominal pain, vomiting or diarrhea. Genitourinary: Negative for dysuria. Musculoskeletal: Negative for back pain. Skin: Negative for rash. Neurological: Negative for headaches, weakness or numbness. Psych: No SI or HI  ____________________________________________   PHYSICAL  EXAM:  VITAL SIGNS: ED Triage Vitals  Enc Vitals Group     BP 09/18/17 1644 (!) 90/47     Pulse Rate 09/18/17 1644 (!) 53     Resp 09/18/17 1644 18     Temp 09/18/17 1644 98.8 F (37.1 C)     Temp Source 09/18/17 1644 Oral     SpO2 09/18/17 1644 99 %     Weight 09/18/17 1650 208 lb (94.3 kg)     Height 09/18/17 1650 5\' 8"  (1.727 m)     Head Circumference --      Peak Flow --      Pain Score 09/18/17 1645 3     Pain Loc --      Pain Edu? --      Excl. in GC? --     Constitutional: Alert and oriented. Well appearing and in no apparent distress. HEENT:      Head: Normocephalic and atraumatic.         Eyes: Conjunctivae are normal. Sclera is non-icteric.       Mouth/Throat: Mucous membranes are moist.       Neck: Supple with no signs of meningismus. Cardiovascular: Regular rate and rhythm. No murmurs, gallops, or rubs. 2+ symmetrical distal pulses are present  in all extremities. No JVD. Respiratory: Normal respiratory effort. Lungs are clear to auscultation bilaterally with decreased air movement bilaterally. No wheezes, crackles, or rhonchi.  Gastrointestinal: Soft, non tender, and non distended with positive bowel sounds. No rebound or guarding. Musculoskeletal: Nontender with normal range of motion in all extremities. No edema, cyanosis, or erythema of extremities. Neurologic: Normal speech and language. Face is symmetric. Moving all extremities. No gross focal neurologic deficits are appreciated. Skin: Skin is warm, dry and intact. No rash noted. Psychiatric: Mood and affect are normal. Speech and behavior are normal.  ____________________________________________   LABS (all labs ordered are listed, but only abnormal results are displayed)  Labs Reviewed  BASIC METABOLIC PANEL - Abnormal; Notable for the following components:      Result Value   Creatinine, Ser 1.08 (*)    GFR calc non Af Amer 54 (*)    All other components within normal limits  URINE DRUG SCREEN,  QUALITATIVE ([facility] ONLY) - Abnormal; Notable for the following components:   Benzodiazepine, Ur Scrn TEST NOT PERFORMED, REAGENT NOT AVAILABLE (*)    All other components within normal limits  CBC  TROPONIN I  HEPATIC FUNCTION PANEL  LIPASE, BLOOD  TROPONIN I   ____________________________________________  EKG  ED ECG REPORT I, Nita Sickle, the attending physician, personally viewed and interpreted this ECG.  Sinus bradycardia, rate of 50, normal intervals, normal axis, T wave inversion in lead III, no ST elevations or depressions.  Unchanged from prior from 2016 ____________________________________________  RADIOLOGY  I have personally reviewed the images performed during this visit and I agree with the Radiologist's read.   Interpretation by Radiologist:  Dg Chest 2 View  Result Date: 09/18/2017 CLINICAL DATA:  Chest pain and shortness of breath for the past 2 days. EXAM: CHEST - 2 VIEW COMPARISON:  03/07/2017. FINDINGS: Normal sized heart. Stable linear scarring in both lower lung zones. Otherwise, clear lungs. Mild thoracolumbar spine degenerative changes and scoliosis. IMPRESSION: No acute abnormality. Electronically Signed   By: Beckie Salts M.D.   On: 09/18/2017 17:43     ____________________________________________   PROCEDURES  Procedure(s) performed: None Procedures Critical Care performed:  None ____________________________________________   INITIAL IMPRESSION / ASSESSMENT AND PLAN / ED COURSE   62 y.o. female with history of obstructive sleep apnea, chronic pain on opiates, hypertension, hyperlipidemia, hypothyroidism who presents for evaluation of chest pain and SOB.  Symptoms only happen with exertion and resolve at rest.  Patient has no known history of CAD.  Presentation concerning for stable angina vs COPD exacerbation with decreased air movement bilaterally.  Decreased air movement bilaterally. EKG shows no ischemic changes.  First troponin is  negative.  Will monitor patient on telemetry, given aspirin and albuterol, and get a second troponin.  If she remains asymptomatic with 2- troponins plan to discharge home with follow-up with her cardiologist Dr. Darrold Junker for further evaluation.   Clinical Course as of Sep 19 2110  Mon Sep 18, 2017  2109 Second troponin is negative.  Patient reports that she feels improved after receiving albuterol here.  Albuterol was given for slightly decreased air movement.  Patient's breath sounds are improved after that.  I will provide her with a prescription for albuterol.  I recommend that she calls her cardiologist first thing in the morning for an appointment.  Recommended return to the emergency room if she has chest pain at rest, fever, or difficulty breathing peer   [CV]    Clinical Course User Index [  CV] Don Perking, [place], MD     As part of my medical decision making, I reviewed the following data within the electronic MEDICAL RECORD NUMBER Nursing notes reviewed and incorporated, Labs reviewed , EKG interpreted , Old EKG reviewed, Old chart reviewed, Radiograph reviewed  Notes from prior ED visits and [place] Controlled Substance Database    Pertinent labs & imaging results that were available during my care of the patient were reviewed by me and considered in my medical decision making (see chart for details).    ____________________________________________   FINAL CLINICAL IMPRESSION(S) / ED DIAGNOSES  Final diagnoses:  Chest pain, unspecified type      NEW MEDICATIONS STARTED DURING THIS VISIT:  ED Discharge Orders         Ordered    albuterol (PROVENTIL HFA;VENTOLIN HFA) 108 (90 Base) MCG/ACT inhaler  Every 6 hours PRN     09/18/17 2112           Note:  This document was prepared using Dragon voice recognition software and may include unintentional dictation errors.    Nita Sickle, MD 09/18/17 2113    Nita Sickle, MD 09/18/17 2115

## 2017-09-18 NOTE — Discharge Instructions (Signed)

## 2017-09-21 ENCOUNTER — Other Ambulatory Visit: Payer: Self-pay

## 2017-09-21 ENCOUNTER — Encounter: Payer: Self-pay | Admitting: Family Medicine

## 2017-09-21 ENCOUNTER — Ambulatory Visit: Payer: BLUE CROSS/BLUE SHIELD | Admitting: Family Medicine

## 2017-09-21 VITALS — BP 100/66 | HR 60 | Temp 98.0°F | Ht 68.0 in | Wt 206.0 lb

## 2017-09-21 DIAGNOSIS — R0609 Other forms of dyspnea: Secondary | ICD-10-CM | POA: Diagnosis not present

## 2017-09-21 DIAGNOSIS — R0600 Dyspnea, unspecified: Secondary | ICD-10-CM

## 2017-09-21 DIAGNOSIS — J4540 Moderate persistent asthma, uncomplicated: Secondary | ICD-10-CM | POA: Diagnosis not present

## 2017-09-21 NOTE — Progress Notes (Signed)
BP 100/66   Pulse 60   Temp 98 F (36.7 C) (Oral)   Ht 5\' 8"  (1.727 m)   Wt 206 lb (93.4 kg)   SpO2 96%   BMI 31.32 kg/m    Subjective:    Patient ID: Kayla Wright, female    DOB: 10/04/1955, 62 y.o.   MRN: [id number]  HPI: Kayla Wright is a 62 y.o. female  Chief Complaint  Patient presents with  . Hospitalization Follow-up    pt states went to the hospital last Monday with breathing problems   Here today for ER f/u for CP and SOB thought to be from an asthma exacerbation. Was given nebulizer tx there with good relief, and EKG and troponins negative. Still having DOE and some cough. Going to Cardiology Sept 9th for further cardiac eval. Never smoker, known hx of asthma. No known CAD but does have well controlled hyperlipidemia on simvastatin.  Relevant past medical, surgical, family and social history reviewed and updated as indicated. Interim medical history since our last visit reviewed. Allergies and medications reviewed and updated.  Review of Systems  Per HPI unless specifically indicated above     Objective:    BP 100/66   Pulse 60   Temp 98 F (36.7 C) (Oral)   Ht 5\' 8"  (1.727 m)   Wt 206 lb (93.4 kg)   SpO2 96%   BMI 31.32 kg/m   Wt Readings from Last 3 Encounters:  09/21/17 206 lb (93.4 kg)  09/18/17 208 lb (94.3 kg)  08/31/17 203 lb (92.1 kg)    Physical Exam  Constitutional: She is oriented to person, place, and time. She appears well-developed and well-nourished.  HENT:  Head: Atraumatic.  Eyes: Conjunctivae and EOM are normal.  Neck: Normal range of motion. Neck supple.  Cardiovascular: Normal rate, regular rhythm and normal heart sounds.  Pulmonary/Chest: Effort normal and breath sounds normal. She has no wheezes. She has no rales.  Musculoskeletal: Normal range of motion.  Neurological: She is alert and oriented to person, place, and time.  Skin: Skin is warm and dry.  Psychiatric: She has a normal mood and affect. Her behavior is normal.    Nursing note and vitals reviewed.   Results for orders placed or performed during the hospital encounter of 09/18/17  Basic metabolic panel  Result Value Ref Range   Sodium 140 135 - 145 mmol/L   Potassium 4.3 3.5 - 5.1 mmol/L   Chloride 103 98 - 111 mmol/L   CO2 26 22 - 32 mmol/L   Glucose, Bld 99 70 - 99 mg/dL   BUN 18 8 - 23 mg/dL   Creatinine, Ser 9.141.08 (H) 0.44 - 1.00 mg/dL   Calcium 9.0 8.9 - 78.210.3 mg/dL   GFR calc non Af Amer 54 (L) >60 mL/min   GFR calc Af Amer >60 >60 mL/min   Anion gap 11 5 - 15  CBC  Result Value Ref Range   WBC 8.8 3.6 - 11.0 K/uL   RBC 4.16 3.80 - 5.20 MIL/uL   Hemoglobin 13.2 12.0 - 16.0 g/dL   HCT 95.638.4 21.335.0 - 08.647.0 %   MCV 92.4 80.0 - 100.0 fL   MCH 31.6 26.0 - 34.0 pg   MCHC 34.2 32.0 - 36.0 g/dL   RDW 57.813.9 46.911.5 - 62.914.5 %   Platelets 242 150 - 440 K/uL  Troponin I  Result Value Ref Range   Troponin I <0.03 <0.03 ng/mL  Urine Drug Screen, Qualitative [facility]  only)  Result Value Ref Range   Tricyclic, Ur Screen NONE DETECTED NONE DETECTED   Amphetamines, Ur Screen NONE DETECTED NONE DETECTED   MDMA (Ecstasy)Ur Screen NONE DETECTED NONE DETECTED   Cocaine Metabolite,Ur [place] NONE DETECTED NONE DETECTED   Opiate, Ur Screen NONE DETECTED NONE DETECTED   Phencyclidine (PCP) Ur S NONE DETECTED NONE DETECTED   Cannabinoid 50 Ng, Ur [place] NONE DETECTED NONE DETECTED   Barbiturates, Ur Screen NONE DETECTED NONE DETECTED   Benzodiazepine, Ur Scrn TEST NOT PERFORMED, REAGENT NOT AVAILABLE (A) NONE DETECTED   Methadone Scn, Ur NONE DETECTED NONE DETECTED  Hepatic function panel  Result Value Ref Range   Total Protein 7.0 6.5 - 8.1 g/dL   Albumin 4.1 3.5 - 5.0 g/dL   AST 21 15 - 41 U/L   ALT 21 0 - 44 U/L   Alkaline Phosphatase 64 38 - 126 U/L   Total Bilirubin 0.8 0.3 - 1.2 mg/dL   Bilirubin, Direct 0.1 0.0 - 0.2 mg/dL   Indirect Bilirubin 0.7 0.3 - 0.9 mg/dL  Lipase, blood  Result Value Ref Range   Lipase 39 11 - 51 U/L  Troponin I  Result Value Ref  Range   Troponin I <0.03 <0.03 ng/mL      Assessment & Plan:   Problem List Items Addressed This Visit      Respiratory   Asthma - Primary    Some good benefit from the albuterol inhaler, but still having exertional sxs. Asmanex sample given. Will follow up in the next few weeks to assess benefit       Other Visit Diagnoses    DOE (dyspnea on exertion)       Some improvement with inhaler, but await further cardiac evaluation in a few weeks. Initial workup in ER neg        Follow up plan: Return in about 4 weeks (around 10/19/2017) for Breathing f/u.

## 2017-09-21 NOTE — Assessment & Plan Note (Addendum)
Some good benefit from the albuterol inhaler, but still having exertional sxs. Asmanex sample given. Will follow up in the next few weeks to assess benefit

## 2017-09-25 NOTE — Patient Instructions (Addendum)
Follow up in 1 month   

## 2017-09-27 DIAGNOSIS — R079 Chest pain, unspecified: Secondary | ICD-10-CM | POA: Insufficient documentation

## 2017-09-27 DIAGNOSIS — I10 Essential (primary) hypertension: Secondary | ICD-10-CM | POA: Insufficient documentation

## 2017-10-04 ENCOUNTER — Telehealth: Payer: Self-pay | Admitting: Family Medicine

## 2017-10-04 NOTE — Telephone Encounter (Signed)
Can we call a medical supply company and see what options there are? She was asking about a travel sized CPAP machine I believe  Copied from CRM [id number]. Topic: General - Other >> Oct 03, 2017  4:21 PM Gaynelle Adu wrote: Reason for CRM: Patient is calling for a update in regards to a Travel tank. Her best contact number is [phone] >> Oct 04, 2017  3:21 PM Gabriel Cirri wrote: Please advise regarding this request.   Thank you >> Oct 04, 2017  3:39 PM Marshall Cork, [place] wrote: Fleet Contras do you know anything about this?

## 2017-10-04 NOTE — Telephone Encounter (Signed)
Called and spoke to [place] at [place] [phone]) about travel tank for CPAP. They stated they did not know what that was. I then contacted [employer] Tristan Schroeder @ [phone] Attempted to contact patient for further information about what she needed, vm was left.   [facility] did say there was a travel Cpap, but insurance rarely covers and patient would need to pay out of pocket most likely.

## 2017-10-06 NOTE — Telephone Encounter (Signed)
Patient is calling back to follow up on this request.  

## 2017-10-11 ENCOUNTER — Telehealth: Payer: Self-pay | Admitting: Unknown Physician Specialty

## 2017-10-11 NOTE — Telephone Encounter (Signed)
Fleet ContrasRachel, we do not have any documentation of an O2 sat of 88 or below. Will patient need to come in and be tested to see if we get this O2 reading?

## 2017-10-11 NOTE — Telephone Encounter (Signed)
She is set to see Ricki Rodriguezdriana 9/30 and can be tested at that time

## 2017-10-11 NOTE — Telephone Encounter (Signed)
Order faxed to [facility]

## 2017-10-11 NOTE — Telephone Encounter (Signed)
Patient notified and verbalized understanding. 

## 2017-10-11 NOTE — Telephone Encounter (Signed)
Copied from CRM [id number]. Topic: General - Other >> Oct 11, 2017 11:31 AM Maia Pettiesrtiz, Kristie S wrote: Reason for CRM: [facility] states she received order for portable o2. Pt does not have qualifying sat on file with [employer]. If we have we can fax in.  To qualify pt needs: Resting on room air <=88% OR all 3 below: Resting on room air sat Ambulating on room air sat <=88% Ambulating on O2 with increase sat  Gilda @ [employer] - ph # [phone], fax # [phone]

## 2017-10-11 NOTE — Telephone Encounter (Signed)
Called and spoke with patient. She wants portable O2, she uses it at night and would like to start traveling. [employer] form placed on provider's desk for review and signature.

## 2017-10-11 NOTE — Telephone Encounter (Signed)
[facility]

## 2017-10-23 ENCOUNTER — Encounter: Payer: Self-pay | Admitting: Physician Assistant

## 2017-10-23 ENCOUNTER — Ambulatory Visit: Payer: BLUE CROSS/BLUE SHIELD | Admitting: Physician Assistant

## 2017-10-23 VITALS — BP 112/61 | HR 91 | Temp 98.8°F | Ht 68.0 in | Wt 210.0 lb

## 2017-10-23 DIAGNOSIS — J4540 Moderate persistent asthma, uncomplicated: Secondary | ICD-10-CM

## 2017-10-23 DIAGNOSIS — Z23 Encounter for immunization: Secondary | ICD-10-CM

## 2017-10-23 NOTE — Progress Notes (Signed)
Subjective:    Patient ID: Kayla Wright, female    DOB: May 27, 1955, 62 y.o.   MRN: [id number]  Kayla Wright is a 62 y.o. female presenting on 10/23/2017 for Asthma (1 month f/up- pt states breathing is not much better)   HPI   Was given sample of Asmanex last visit. Currently taking this and albuterol. Reports breathing is better. Has gone to follow up with Dr. Darrold Junker and is getting some cardiac testing.   She reports she needs portable O2 because she wants to go camping. She has sleep apnea and currently has CPAP.    Social History   Tobacco Use  . Smoking status: Never Smoker  . Smokeless tobacco: Never Used  Substance Use Topics  . Alcohol use: No    Alcohol/week: 0.0 standard drinks  . Drug use: No    Review of Systems Per HPI unless specifically indicated above     Objective:    BP 112/61   Pulse 91   Temp 98.8 F (37.1 C) (Oral)   Ht 5\' 8"  (1.727 m)   Wt 210 lb (95.3 kg)   SpO2 96%   BMI 31.93 kg/m   Wt Readings from Last 3 Encounters:  10/23/17 210 lb (95.3 kg)  09/21/17 206 lb (93.4 kg)  09/18/17 208 lb (94.3 kg)    Physical Exam  Constitutional: She is oriented to person, place, and time. She appears well-developed.  Cardiovascular: Normal rate and regular rhythm.  Pulmonary/Chest: Effort normal and breath sounds normal.  Neurological: She is alert and oriented to person, place, and time.  Skin: Skin is warm and dry.  Psychiatric: She has a normal mood and affect. Her behavior is normal.   Results for orders placed or performed during the hospital encounter of 09/18/17  Basic metabolic panel  Result Value Ref Range   Sodium 140 135 - 145 mmol/L   Potassium 4.3 3.5 - 5.1 mmol/L   Chloride 103 98 - 111 mmol/L   CO2 26 22 - 32 mmol/L   Glucose, Bld 99 70 - 99 mg/dL   BUN 18 8 - 23 mg/dL   Creatinine, Ser 4.09 (H) 0.44 - 1.00 mg/dL   Calcium 9.0 8.9 - 81.1 mg/dL   GFR calc non Af Amer 54 (L) >60 mL/min   GFR calc Af Amer >60 >60 mL/min   Anion  gap 11 5 - 15  CBC  Result Value Ref Range   WBC 8.8 3.6 - 11.0 K/uL   RBC 4.16 3.80 - 5.20 MIL/uL   Hemoglobin 13.2 12.0 - 16.0 g/dL   HCT 91.4 78.2 - 95.6 %   MCV 92.4 80.0 - 100.0 fL   MCH 31.6 26.0 - 34.0 pg   MCHC 34.2 32.0 - 36.0 g/dL   RDW 21.3 08.6 - 57.8 %   Platelets 242 150 - 440 K/uL  Troponin I  Result Value Ref Range   Troponin I <0.03 <0.03 ng/mL  Urine Drug Screen, Qualitative ([facility] only)  Result Value Ref Range   Tricyclic, Ur Screen NONE DETECTED NONE DETECTED   Amphetamines, Ur Screen NONE DETECTED NONE DETECTED   MDMA (Ecstasy)Ur Screen NONE DETECTED NONE DETECTED   Cocaine Metabolite,Ur [place] NONE DETECTED NONE DETECTED   Opiate, Ur Screen NONE DETECTED NONE DETECTED   Phencyclidine (PCP) Ur S NONE DETECTED NONE DETECTED   Cannabinoid 50 Ng, Ur [place] NONE DETECTED NONE DETECTED   Barbiturates, Ur Screen NONE DETECTED NONE DETECTED   Benzodiazepine, Ur Scrn TEST NOT  PERFORMED, REAGENT NOT AVAILABLE (A) NONE DETECTED   Methadone Scn, Ur NONE DETECTED NONE DETECTED  Hepatic function panel  Result Value Ref Range   Total Protein 7.0 6.5 - 8.1 g/dL   Albumin 4.1 3.5 - 5.0 g/dL   AST 21 15 - 41 U/L   ALT 21 0 - 44 U/L   Alkaline Phosphatase 64 38 - 126 U/L   Total Bilirubin 0.8 0.3 - 1.2 mg/dL   Bilirubin, Direct 0.1 0.0 - 0.2 mg/dL   Indirect Bilirubin 0.7 0.3 - 0.9 mg/dL  Lipase, blood  Result Value Ref Range   Lipase 39 11 - 51 U/L  Troponin I  Result Value Ref Range   Troponin I <0.03 <0.03 ng/mL      Assessment & Plan:  1. Need for influenza vaccination  - Flu Vaccine QUAD 36+ mos IM  2. Moderate persistent asthma without complication  Improved with Asmanex. It has been clarified that patient does not have O2, she has CPAP. She does not qualify for O2. She wants a portable machine to go camping that doesn't need electricity source. Advised she would have to bring her standard CPAP with her.   Follow up plan: Return in about 3 months (around  01/22/2018) for chronic .  Osvaldo Angst, PA-C [facility] 10/23/2017, 5:04 PM

## 2017-11-12 ENCOUNTER — Other Ambulatory Visit: Payer: Self-pay | Admitting: Unknown Physician Specialty

## 2017-11-13 NOTE — Telephone Encounter (Signed)
Reordered with refills Requested Prescriptions  Pending Prescriptions Disp Refills  . simvastatin (ZOCOR) 40 MG tablet [Pharmacy Med Name: SIMVASTATIN 40MG     TAB] 90 tablet 3    Sig: TAKE 1 TABLET BY MOUTH ONCE DAILY     Cardiovascular:  Antilipid - Statins Failed - 11/12/2017  6:30 AM      Failed - Triglycerides in normal range and within 360 days    Triglycerides  Date Value Ref Range Status  08/25/2017 204 (H) 0 - 149 mg/dL Final         Passed - Total Cholesterol in normal range and within 360 days    Cholesterol, Total  Date Value Ref Range Status  08/25/2017 172 100 - 199 mg/dL Final         Passed - LDL in normal range and within 360 days    LDL Calculated  Date Value Ref Range Status  08/25/2017 91 0 - 99 mg/dL Final         Passed - HDL in normal range and within 360 days    HDL  Date Value Ref Range Status  08/25/2017 40 >39 mg/dL Final         Passed - Patient is not pregnant      Passed - Valid encounter within last 12 months    Recent Outpatient Visits          3 weeks ago Moderate persistent asthma without complication   [facility] Osvaldo Angst M, PA-C   1 month ago Moderate persistent asthma without complication   [facility] Roosvelt Maser [place], [place]   2 months ago Hyperlipidemia, unspecified hyperlipidemia type   [facility], [place], [place]   8 months ago Cough   [facility] Gabriel Cirri, NP   8 months ago Cough   [facility] Gabriel Cirri, NP      Future Appointments            In 2 months Maurice March, Salley Hews, PA-C [facility]

## 2017-12-05 DIAGNOSIS — G4733 Obstructive sleep apnea (adult) (pediatric): Secondary | ICD-10-CM | POA: Diagnosis not present

## 2017-12-11 DIAGNOSIS — M87072 Idiopathic aseptic necrosis of left ankle: Secondary | ICD-10-CM | POA: Diagnosis not present

## 2017-12-11 DIAGNOSIS — Z79899 Other long term (current) drug therapy: Secondary | ICD-10-CM | POA: Diagnosis not present

## 2017-12-11 DIAGNOSIS — Z79891 Long term (current) use of opiate analgesic: Secondary | ICD-10-CM | POA: Diagnosis not present

## 2017-12-11 DIAGNOSIS — G894 Chronic pain syndrome: Secondary | ICD-10-CM | POA: Diagnosis not present

## 2017-12-11 DIAGNOSIS — M25579 Pain in unspecified ankle and joints of unspecified foot: Secondary | ICD-10-CM | POA: Diagnosis not present

## 2017-12-11 DIAGNOSIS — M549 Dorsalgia, unspecified: Secondary | ICD-10-CM | POA: Diagnosis not present

## 2017-12-19 ENCOUNTER — Ambulatory Visit: Payer: Medicare Other | Admitting: Family Medicine

## 2017-12-19 ENCOUNTER — Encounter: Payer: Self-pay | Admitting: Family Medicine

## 2017-12-19 VITALS — BP 112/71 | HR 66 | Temp 98.1°F | Wt 211.0 lb

## 2017-12-19 DIAGNOSIS — J4541 Moderate persistent asthma with (acute) exacerbation: Secondary | ICD-10-CM

## 2017-12-19 MED ORDER — PREDNISONE 10 MG PO TABS: ORAL_TABLET | ORAL | 0 refills | Status: DC

## 2017-12-19 MED ORDER — AZITHROMYCIN 250 MG PO TABS: ORAL_TABLET | ORAL | 0 refills | Status: DC

## 2017-12-19 NOTE — Progress Notes (Signed)
BP 112/71   Pulse 66   Temp 98.1 F (36.7 C) (Oral)   Wt 211 lb (95.7 kg)   SpO2 95%   BMI 32.08 kg/m    Subjective:    Patient ID: Kayla Wright, female    DOB: 27-Mar-1955, 62 y.o.   MRN: [id number]  HPI: Kayla Wright is a 62 y.o. female  Chief Complaint  Patient presents with  . Cough    Dry. x 1 week. Denies any pain, fevers   UPPER RESPIRATORY TRACT INFECTION Duration: 1 week Worst symptom: cough Fever: no Cough: yes Shortness of breath: yes Wheezing: no Chest pain: no Chest tightness: no Chest congestion: yes Nasal congestion: yes Runny nose: no Post nasal drip: no Sneezing: no Sore throat: yes Swollen glands: no Sinus pressure: no Headache: no Face pain: no Toothache: no Ear pain: no  Ear pressure: no  Eyes red/itching:no Eye drainage/crusting: no  Vomiting: no Rash: no Fatigue: yes Sick contacts: no Strep contacts: no  Context: worse Recurrent sinusitis: no Relief with OTC cold/cough medications: yes  Treatments attempted: inhalers    Relevant past medical, surgical, family and social history reviewed and updated as indicated. Interim medical history since our last visit reviewed. Allergies and medications reviewed and updated.  Review of Systems  Constitutional: Positive for fatigue. Negative for activity change, appetite change, chills, diaphoresis, fever and unexpected weight change.  HENT: Positive for congestion, postnasal drip, rhinorrhea and sore throat. Negative for dental problem, drooling, ear discharge, ear pain, facial swelling, hearing loss, mouth sores, nosebleeds, sinus pressure, sinus pain, sneezing, tinnitus, trouble swallowing and voice change.   Eyes: Negative.   Respiratory: Positive for cough and shortness of breath. Negative for apnea, choking, chest tightness, wheezing and stridor.   Cardiovascular: Negative.   Gastrointestinal: Negative.   Psychiatric/Behavioral: Negative.     Per HPI unless specifically indicated  above     Objective:    BP 112/71   Pulse 66   Temp 98.1 F (36.7 C) (Oral)   Wt 211 lb (95.7 kg)   SpO2 95%   BMI 32.08 kg/m   Wt Readings from Last 3 Encounters:  12/19/17 211 lb (95.7 kg)  10/23/17 210 lb (95.3 kg)  09/21/17 206 lb (93.4 kg)    Physical Exam  Constitutional: She is oriented to person, place, and time. She appears well-developed and well-nourished. No distress.  HENT:  Head: Normocephalic and atraumatic.  Right Ear: Hearing and external ear normal.  Left Ear: Hearing and external ear normal.  Nose: Nose normal.  Mouth/Throat: Oropharynx is clear and moist. No oropharyngeal exudate.  Eyes: Pupils are equal, round, and reactive to light. Conjunctivae, EOM and lids are normal. Right eye exhibits no discharge. Left eye exhibits no discharge. No scleral icterus.  Neck: Normal range of motion. Neck supple. No JVD present. No tracheal deviation present. No thyromegaly present.  Cardiovascular: Normal rate, regular rhythm, normal heart sounds and intact distal pulses. Exam reveals no gallop and no friction rub.  No murmur heard. Pulmonary/Chest: Effort normal. No stridor. No respiratory distress. She has wheezes. She has no rales. She exhibits no tenderness.  Musculoskeletal: Normal range of motion.  Lymphadenopathy:    She has no cervical adenopathy.  Neurological: She is alert and oriented to person, place, and time.  Skin: Skin is warm, dry and intact. Capillary refill takes less than 2 seconds. No rash noted. She is not diaphoretic. No erythema. No pallor.  Psychiatric: She has a normal mood and affect.  Her speech is normal and behavior is normal. Judgment and thought content normal. Cognition and memory are normal.  Nursing note and vitals reviewed.   Results for orders placed or performed during the hospital encounter of 09/18/17  Basic metabolic panel  Result Value Ref Range   Sodium 140 135 - 145 mmol/L   Potassium 4.3 3.5 - 5.1 mmol/L   Chloride 103 98  - 111 mmol/L   CO2 26 22 - 32 mmol/L   Glucose, Bld 99 70 - 99 mg/dL   BUN 18 8 - 23 mg/dL   Creatinine, Ser 1.611.08 (H) 0.44 - 1.00 mg/dL   Calcium 9.0 8.9 - 09.610.3 mg/dL   GFR calc non Af Amer 54 (L) >60 mL/min   GFR calc Af Amer >60 >60 mL/min   Anion gap 11 5 - 15  CBC  Result Value Ref Range   WBC 8.8 3.6 - 11.0 K/uL   RBC 4.16 3.80 - 5.20 MIL/uL   Hemoglobin 13.2 12.0 - 16.0 g/dL   HCT 04.538.4 40.935.0 - 81.147.0 %   MCV 92.4 80.0 - 100.0 fL   MCH 31.6 26.0 - 34.0 pg   MCHC 34.2 32.0 - 36.0 g/dL   RDW 91.413.9 78.211.5 - 95.614.5 %   Platelets 242 150 - 440 K/uL  Troponin I  Result Value Ref Range   Troponin I <0.03 <0.03 ng/mL  Urine Drug Screen, Qualitative ([facility] only)  Result Value Ref Range   Tricyclic, Ur Screen NONE DETECTED NONE DETECTED   Amphetamines, Ur Screen NONE DETECTED NONE DETECTED   MDMA (Ecstasy)Ur Screen NONE DETECTED NONE DETECTED   Cocaine Metabolite,Ur [place] NONE DETECTED NONE DETECTED   Opiate, Ur Screen NONE DETECTED NONE DETECTED   Phencyclidine (PCP) Ur S NONE DETECTED NONE DETECTED   Cannabinoid 50 Ng, Ur [place] NONE DETECTED NONE DETECTED   Barbiturates, Ur Screen NONE DETECTED NONE DETECTED   Benzodiazepine, Ur Scrn TEST NOT PERFORMED, REAGENT NOT AVAILABLE (A) NONE DETECTED   Methadone Scn, Ur NONE DETECTED NONE DETECTED  Hepatic function panel  Result Value Ref Range   Total Protein 7.0 6.5 - 8.1 g/dL   Albumin 4.1 3.5 - 5.0 g/dL   AST 21 15 - 41 U/L   ALT 21 0 - 44 U/L   Alkaline Phosphatase 64 38 - 126 U/L   Total Bilirubin 0.8 0.3 - 1.2 mg/dL   Bilirubin, Direct 0.1 0.0 - 0.2 mg/dL   Indirect Bilirubin 0.7 0.3 - 0.9 mg/dL  Lipase, blood  Result Value Ref Range   Lipase 39 11 - 51 U/L  Troponin I  Result Value Ref Range   Troponin I <0.03 <0.03 ng/mL      Assessment & Plan:   Problem List Items Addressed This Visit      Respiratory   Asthma - Primary    Will treat with azithromycin and prednisone. Call with any concerns. Recheck lungs 2 weeks.        Relevant Medications   predniSONE (DELTASONE) 10 MG tablet       Follow up plan: Return in about 2 weeks (around 01/02/2018) for Lung recheck.

## 2017-12-19 NOTE — Assessment & Plan Note (Signed)
Will treat with azithromycin and prednisone. Call with any concerns. Recheck lungs 2 weeks.

## 2018-01-04 DIAGNOSIS — G4733 Obstructive sleep apnea (adult) (pediatric): Secondary | ICD-10-CM | POA: Diagnosis not present

## 2018-01-08 ENCOUNTER — Ambulatory Visit: Payer: BLUE CROSS/BLUE SHIELD | Admitting: Family Medicine

## 2018-01-08 DIAGNOSIS — Z79899 Other long term (current) drug therapy: Secondary | ICD-10-CM | POA: Diagnosis not present

## 2018-01-08 DIAGNOSIS — G894 Chronic pain syndrome: Secondary | ICD-10-CM | POA: Diagnosis not present

## 2018-01-08 DIAGNOSIS — M25579 Pain in unspecified ankle and joints of unspecified foot: Secondary | ICD-10-CM | POA: Diagnosis not present

## 2018-01-08 DIAGNOSIS — M5136 Other intervertebral disc degeneration, lumbar region: Secondary | ICD-10-CM | POA: Diagnosis not present

## 2018-01-08 DIAGNOSIS — M542 Cervicalgia: Secondary | ICD-10-CM | POA: Diagnosis not present

## 2018-01-08 DIAGNOSIS — M87072 Idiopathic aseptic necrosis of left ankle: Secondary | ICD-10-CM | POA: Diagnosis not present

## 2018-01-08 DIAGNOSIS — Z79891 Long term (current) use of opiate analgesic: Secondary | ICD-10-CM | POA: Diagnosis not present

## 2018-01-22 ENCOUNTER — Encounter: Payer: Self-pay | Admitting: Family Medicine

## 2018-01-22 ENCOUNTER — Ambulatory Visit: Payer: BLUE CROSS/BLUE SHIELD | Admitting: Family Medicine

## 2018-01-22 VITALS — BP 113/70 | HR 75 | Temp 98.6°F | Ht 68.0 in | Wt 211.9 lb

## 2018-01-22 DIAGNOSIS — J4541 Moderate persistent asthma with (acute) exacerbation: Secondary | ICD-10-CM

## 2018-01-22 MED ORDER — TRIAMCINOLONE ACETONIDE 0.1 % EX CREA: 1.0000 "application " | TOPICAL_CREAM | Freq: Two times a day (BID) | CUTANEOUS | 0 refills | Status: DC

## 2018-01-22 MED ORDER — UMECLIDINIUM-VILANTEROL 62.5-25 MCG/INH IN AEPB: 1.0000 | INHALATION_SPRAY | Freq: Every day | RESPIRATORY_TRACT | 0 refills | Status: DC

## 2018-01-22 NOTE — Progress Notes (Signed)
BP 113/70 (BP Location: Right Arm, Patient Position: Sitting, Cuff Size: Normal)   Pulse 75   Temp 98.6 F (37 C) (Oral)   Ht 5\' 8"  (1.727 m)   Wt 211 lb 14.4 oz (96.1 kg)   SpO2 95%   BMI 32.22 kg/m    Subjective:    Patient ID: Kayla Wright, female    DOB: 06/19/1955, 62 y.o.   MRN: [id number]  HPI: Kayla Wright is a 62 y.o. female  Chief Complaint  Patient presents with  . Asthma    No complaints. Patient states she's doing better.    Here today following up on her asthma. Using her albuterol about twice a day. Still SOB once in a while but not as bad. No new concerns. No fevers, chills, wheezing, chest tightness.   Relevant past medical, surgical, family and social history reviewed and updated as indicated. Interim medical history since our last visit reviewed. Allergies and medications reviewed and updated.  Review of Systems  Per HPI unless specifically indicated above     Objective:    BP 113/70 (BP Location: Right Arm, Patient Position: Sitting, Cuff Size: Normal)   Pulse 75   Temp 98.6 F (37 C) (Oral)   Ht 5\' 8"  (1.727 m)   Wt 211 lb 14.4 oz (96.1 kg)   SpO2 95%   BMI 32.22 kg/m   Wt Readings from Last 3 Encounters:  01/22/18 211 lb 14.4 oz (96.1 kg)  12/19/17 211 lb (95.7 kg)  10/23/17 210 lb (95.3 kg)    Physical Exam Vitals signs and nursing note reviewed.  Constitutional:      Appearance: Normal appearance. She is not ill-appearing.  HENT:     Head: Atraumatic.  Eyes:     Extraocular Movements: Extraocular movements intact.     Conjunctiva/sclera: Conjunctivae normal.  Neck:     Musculoskeletal: Normal range of motion and neck supple.  Cardiovascular:     Rate and Rhythm: Normal rate and regular rhythm.     Heart sounds: Normal heart sounds.  Pulmonary:     Effort: Pulmonary effort is normal. No respiratory distress.     Breath sounds: Normal breath sounds. No wheezing or rales.  Musculoskeletal: Normal range of motion.  Skin:  General: Skin is warm and dry.  Neurological:     Mental Status: She is alert and oriented to person, place, and time.  Psychiatric:        Mood and Affect: Mood normal.        Thought Content: Thought content normal.        Judgment: Judgment normal.     Results for orders placed or performed during the hospital encounter of 09/18/17  Basic metabolic panel  Result Value Ref Range   Sodium 140 135 - 145 mmol/L   Potassium 4.3 3.5 - 5.1 mmol/L   Chloride 103 98 - 111 mmol/L   CO2 26 22 - 32 mmol/L   Glucose, Bld 99 70 - 99 mg/dL   BUN 18 8 - 23 mg/dL   Creatinine, Ser 4.091.08 (H) 0.44 - 1.00 mg/dL   Calcium 9.0 8.9 - 81.110.3 mg/dL   GFR calc non Af Amer 54 (L) >60 mL/min   GFR calc Af Amer >60 >60 mL/min   Anion gap 11 5 - 15  CBC  Result Value Ref Range   WBC 8.8 3.6 - 11.0 K/uL   RBC 4.16 3.80 - 5.20 MIL/uL   Hemoglobin 13.2 12.0 - 16.0 g/dL  HCT 38.4 35.0 - 47.0 %   MCV 92.4 80.0 - 100.0 fL   MCH 31.6 26.0 - 34.0 pg   MCHC 34.2 32.0 - 36.0 g/dL   RDW 16.113.9 09.611.5 - 04.514.5 %   Platelets 242 150 - 440 K/uL  Troponin I  Result Value Ref Range   Troponin I <0.03 <0.03 ng/mL  Urine Drug Screen, Qualitative ([facility] only)  Result Value Ref Range   Tricyclic, Ur Screen NONE DETECTED NONE DETECTED   Amphetamines, Ur Screen NONE DETECTED NONE DETECTED   MDMA (Ecstasy)Ur Screen NONE DETECTED NONE DETECTED   Cocaine Metabolite,Ur [place] NONE DETECTED NONE DETECTED   Opiate, Ur Screen NONE DETECTED NONE DETECTED   Phencyclidine (PCP) Ur S NONE DETECTED NONE DETECTED   Cannabinoid 50 Ng, Ur [place] NONE DETECTED NONE DETECTED   Barbiturates, Ur Screen NONE DETECTED NONE DETECTED   Benzodiazepine, Ur Scrn TEST NOT PERFORMED, REAGENT NOT AVAILABLE (A) NONE DETECTED   Methadone Scn, Ur NONE DETECTED NONE DETECTED  Hepatic function panel  Result Value Ref Range   Total Protein 7.0 6.5 - 8.1 g/dL   Albumin 4.1 3.5 - 5.0 g/dL   AST 21 15 - 41 U/L   ALT 21 0 - 44 U/L   Alkaline Phosphatase 64 38 -  126 U/L   Total Bilirubin 0.8 0.3 - 1.2 mg/dL   Bilirubin, Direct 0.1 0.0 - 0.2 mg/dL   Indirect Bilirubin 0.7 0.3 - 0.9 mg/dL  Lipase, blood  Result Value Ref Range   Lipase 39 11 - 51 U/L  Troponin I  Result Value Ref Range   Troponin I <0.03 <0.03 ng/mL      Assessment & Plan:   Problem List Items Addressed This Visit      Respiratory   Asthma - Primary    Will add anoro to help improve control and hopefully reduce frequency of albuterol use.       Relevant Medications   umeclidinium-vilanterol (ANORO ELLIPTA) 62.5-25 MCG/INH AEPB       Follow up plan: Return in about 2 months (around 03/24/2018) for CPE.

## 2018-01-27 NOTE — Assessment & Plan Note (Signed)
Will add anoro to help improve control and hopefully reduce frequency of albuterol use.

## 2018-01-31 DIAGNOSIS — R0602 Shortness of breath: Secondary | ICD-10-CM | POA: Diagnosis not present

## 2018-01-31 DIAGNOSIS — G4733 Obstructive sleep apnea (adult) (pediatric): Secondary | ICD-10-CM | POA: Diagnosis not present

## 2018-01-31 DIAGNOSIS — R079 Chest pain, unspecified: Secondary | ICD-10-CM | POA: Diagnosis not present

## 2018-01-31 DIAGNOSIS — E7800 Pure hypercholesterolemia, unspecified: Secondary | ICD-10-CM | POA: Diagnosis not present

## 2018-01-31 DIAGNOSIS — I10 Essential (primary) hypertension: Secondary | ICD-10-CM | POA: Diagnosis not present

## 2018-02-05 DIAGNOSIS — M25579 Pain in unspecified ankle and joints of unspecified foot: Secondary | ICD-10-CM | POA: Diagnosis not present

## 2018-02-05 DIAGNOSIS — G894 Chronic pain syndrome: Secondary | ICD-10-CM | POA: Diagnosis not present

## 2018-02-05 DIAGNOSIS — M5136 Other intervertebral disc degeneration, lumbar region: Secondary | ICD-10-CM | POA: Diagnosis not present

## 2018-02-05 DIAGNOSIS — Z79891 Long term (current) use of opiate analgesic: Secondary | ICD-10-CM | POA: Diagnosis not present

## 2018-02-05 DIAGNOSIS — Z79899 Other long term (current) drug therapy: Secondary | ICD-10-CM | POA: Diagnosis not present

## 2018-02-08 ENCOUNTER — Other Ambulatory Visit: Payer: Self-pay | Admitting: Family Medicine

## 2018-02-08 NOTE — Telephone Encounter (Signed)
Requested medication (s) are due for refill today: yes  Requested medication (s) are on the active medication list: yes  Last refill:  08/25/17  Future visit scheduled: no  Notes to clinic:  Medication not delegated    Requested Prescriptions  Pending Prescriptions Disp Refills   QUEtiapine Fumarate (SEROQUEL XR) 150 MG 24 hr tablet [Pharmacy Med Name: QUETIAPINE ER 150 MG TABLET] 90 tablet 0    Sig: Take 1 tablet (150 mg total) by mouth at bedtime.     Not Delegated - Psychiatry:  Antipsychotics - Second Generation (Atypical) - quetiapine Failed - 02/08/2018 11:57 AM      Failed - This refill cannot be delegated      Passed - ALT in normal range and within 180 days    ALT  Date Value Ref Range Status  09/18/2017 21 0 - 44 U/L Final         Passed - AST in normal range and within 180 days    AST  Date Value Ref Range Status  09/18/2017 21 15 - 41 U/L Final         Passed - Completed PHQ-2 or PHQ-9 in the last 360 days.      Passed - Last BP in normal range    BP Readings from Last 1 Encounters:  01/22/18 113/70         Passed - Valid encounter within last 6 months    Recent Outpatient Visits          2 weeks ago Moderate persistent asthma with acute exacerbation   [facility] Roosvelt Maser [place], [place]   1 month ago Moderate persistent asthma with acute exacerbation   [facility] [place], Megan P, DO   3 months ago Moderate persistent asthma without complication   [facility] Osvaldo Angst M, PA-C   4 months ago Moderate persistent asthma without complication   [facility] Roosvelt Maser [place], [place]   5 months ago Hyperlipidemia, unspecified hyperlipidemia type   [facility], [place], [place]

## 2018-02-23 ENCOUNTER — Other Ambulatory Visit: Payer: Self-pay | Admitting: Physician Assistant

## 2018-02-26 ENCOUNTER — Encounter: Payer: Self-pay | Admitting: Family Medicine

## 2018-02-26 ENCOUNTER — Other Ambulatory Visit: Payer: Self-pay

## 2018-02-26 ENCOUNTER — Ambulatory Visit (INDEPENDENT_AMBULATORY_CARE_PROVIDER_SITE_OTHER): Payer: Commercial Managed Care - HMO | Admitting: Family Medicine

## 2018-02-26 VITALS — BP 108/67 | HR 87 | Temp 99.0°F | Ht 66.3 in | Wt 213.0 lb

## 2018-02-26 DIAGNOSIS — M25572 Pain in left ankle and joints of left foot: Secondary | ICD-10-CM

## 2018-02-26 DIAGNOSIS — G8929 Other chronic pain: Secondary | ICD-10-CM

## 2018-02-26 DIAGNOSIS — Z1239 Encounter for other screening for malignant neoplasm of breast: Secondary | ICD-10-CM | POA: Diagnosis not present

## 2018-02-26 DIAGNOSIS — Z0000 Encounter for general adult medical examination without abnormal findings: Secondary | ICD-10-CM

## 2018-02-26 DIAGNOSIS — E038 Other specified hypothyroidism: Secondary | ICD-10-CM

## 2018-02-26 DIAGNOSIS — M5136 Other intervertebral disc degeneration, lumbar region: Secondary | ICD-10-CM

## 2018-02-26 DIAGNOSIS — I10 Essential (primary) hypertension: Secondary | ICD-10-CM

## 2018-02-26 DIAGNOSIS — J4541 Moderate persistent asthma with (acute) exacerbation: Secondary | ICD-10-CM | POA: Diagnosis not present

## 2018-02-26 DIAGNOSIS — E785 Hyperlipidemia, unspecified: Secondary | ICD-10-CM | POA: Diagnosis not present

## 2018-02-26 DIAGNOSIS — M5030 Other cervical disc degeneration, unspecified cervical region: Secondary | ICD-10-CM

## 2018-02-26 DIAGNOSIS — F322 Major depressive disorder, single episode, severe without psychotic features: Secondary | ICD-10-CM

## 2018-02-26 LAB — UA/M W/RFLX CULTURE, ROUTINE
Bilirubin, UA: NEGATIVE
Glucose, UA: NEGATIVE
Ketones, UA: NEGATIVE
Leukocytes, UA: NEGATIVE
Nitrite, UA: NEGATIVE
Protein, UA: NEGATIVE
RBC, UA: NEGATIVE
Specific Gravity, UA: 1.015 (ref 1.005–1.030)
Urobilinogen, Ur: 0.2 mg/dL (ref 0.2–1.0)
pH, UA: 5.5 (ref 5.0–7.5)

## 2018-02-26 NOTE — Progress Notes (Signed)
BP 108/67 (BP Location: Left Arm, Patient Position: Sitting, Cuff Size: Normal)   Pulse 87   Temp 99 F (37.2 C)   Ht 5' 6.3" (1.684 m)   Wt 213 lb (96.6 kg)   SpO2 95%   BMI 34.07 kg/m    Subjective:    Patient ID: Kayla Wright, female    DOB: October 14, 1955, 63 y.o.   MRN: [id number]  HPI: Kayla Wright is a 63 y.o. female presenting on 02/26/2018 for comprehensive medical examination. Current medical complaints include:see below  Pt has been followed by Pain Clinic for chronic left ankle pain, low back pain, and neck pain but has been discharge as they reported cocaine in her urine. She was told she would need to establish elsewhere. Adamantly denies use of cocaine and does not understand why that happened to her.   BPs stable and WNL on current regimen, taking medicines faithfully without side effects. Denies Cp, SOB, HAs.  Breathing doing better since addition of anoro for her asthma. No recent flares or wheezing.   Taking simvastatin for cholesterol management without issue.   Seroquel and prozac seem to do well for moods and sleep. Denies SI/HI.   Taking 50 mcg synthroid currently for hypothyroidism, asymptomatic.   She currently lives with: Menopausal Symptoms: no  Depression Screen done today and results listed below:  Depression screen [facility] 2/9 02/26/2018 09/21/2017 08/25/2017 03/07/2017 02/03/2017  Decreased Interest 2 0 1 1 1   Down, Depressed, Hopeless 0 0 1 2 1   PHQ - 2 Score 2 0 2 3 2   Altered sleeping 3 3 2  0 1  Tired, decreased energy 3 2 3 1 1   Change in appetite 0 1 2 0 0  Feeling bad or failure about yourself  1 1 2 1 2   Trouble concentrating 0 1 2 0 1  Moving slowly or fidgety/restless 0 1 0 0 0  Suicidal thoughts 0 0 0 0 1  PHQ-9 Score 9 9 13 5 8   Difficult doing work/chores Not difficult at all - - - -  Some recent data might be hidden    The patient does not have a history of falls. I did not complete a risk assessment for falls. A plan of care for falls was  not documented.   Past Medical History:  Past Medical History:  Diagnosis Date  . Allergy   . Common migraine   . Depression   . GERD (gastroesophageal reflux disease)   . Hyperlipidemia   . Hypertension   . Insomnia   . Menopausal symptom   . Osteopenia   . Thyroid disease     Surgical History:  Past Surgical History:  Procedure Laterality Date  . ANKLE FRACTURE SURGERY  06/18/15  . COLONOSCOPY WITH PROPOFOL N/A 08/31/2017   Procedure: COLONOSCOPY WITH PROPOFOL;  Surgeon: Toney Reil, MD;  Location: [facility] ENDOSCOPY;  Service: Gastroenterology;  Laterality: N/A;  . TUBAL LIGATION      Medications:  Current Outpatient Medications on File Prior to Visit  Medication Sig  . albuterol (PROVENTIL HFA;VENTOLIN HFA) 108 (90 Base) MCG/ACT inhaler Inhale 2 puffs into the lungs every 6 (six) hours as needed for wheezing or shortness of breath.  . Ascorbic Acid (VITAMIN C) 1000 MG tablet Take 1,000 mg by mouth daily.  . Cholecalciferol (VITAMIN D3) 5000 units CAPS Take 5,000 Units by mouth daily.  . cloNIDine (CATAPRES) 0.2 MG tablet Take 1 tablet by mouth 3 (three) times daily.  . Cyanocobalamin (VITAMIN  B-12 PO) Take 1 tablet by mouth daily.  . diclofenac sodium (VOLTAREN) 1 % GEL Apply 1 application topically daily.  Marland Kitchen. FLUoxetine (PROZAC) 40 MG capsule TAKE 1 CAPSULE BY MOUTH ONCE DAILY  . levothyroxine (SYNTHROID, LEVOTHROID) 50 MCG tablet TAKE ONE TABLET BY MOUTH ONCE DAILY BEFORE BREAKFAST  . QUEtiapine Fumarate (SEROQUEL XR) 150 MG 24 hr tablet Take 1 tablet (150 mg total) by mouth at bedtime.  . simvastatin (ZOCOR) 40 MG tablet TAKE 1 TABLET BY MOUTH ONCE DAILY  . umeclidinium-vilanterol (ANORO ELLIPTA) 62.5-25 MCG/INH AEPB Inhale 1 puff into the lungs daily.   No current facility-administered medications on file prior to visit.     Allergies:  No Known Allergies  Social History:  Social History   Socioeconomic History  . Marital status: Divorced    Spouse name:  Not on file  . Number of children: Not on file  . Years of education: Not on file  . Highest education level: Not on file  Occupational History  . Not on file  Social Needs  . Financial resource strain: Not on file  . Food insecurity:    Worry: Not on file    Inability: Not on file  . Transportation needs:    Medical: Not on file    Non-medical: Not on file  Tobacco Use  . Smoking status: Never Smoker  . Smokeless tobacco: Never Used  Substance and Sexual Activity  . Alcohol use: No    Alcohol/week: 0.0 standard drinks  . Drug use: No  . Sexual activity: Never    Birth control/protection: Surgical  Lifestyle  . Physical activity:    Days per week: Not on file    Minutes per session: Not on file  . Stress: Not on file  Relationships  . Social connections:    Talks on phone: Not on file    Gets together: Not on file    Attends religious service: Not on file    Active member of club or organization: Not on file    Attends meetings of clubs or organizations: Not on file    Relationship status: Not on file  . Intimate partner violence:    Fear of current or ex partner: Not on file    Emotionally abused: Not on file    Physically abused: Not on file    Forced sexual activity: Not on file  Other Topics Concern  . Not on file  Social History Narrative  . Not on file   Social History   Tobacco Use  Smoking Status Never Smoker  Smokeless Tobacco Never Used   Social History   Substance and Sexual Activity  Alcohol Use No  . Alcohol/week: 0.0 standard drinks    Family History:  Family History  Problem Relation Age of Onset  . Diabetes Mother   . Heart disease Mother   . Cirrhosis Mother   . Heart disease Father     Past medical history, surgical history, medications, allergies, family history and social history reviewed with patient today and changes made to appropriate areas of the chart.   Review of Systems - General ROS: negative Psychological ROS:  negative Ophthalmic ROS: negative ENT ROS: negative Allergy and Immunology ROS: negative Hematological and Lymphatic ROS: negative Endocrine ROS: negative Breast ROS: negative for breast lumps Respiratory ROS: no cough, shortness of breath, or wheezing Cardiovascular ROS: no chest pain or dyspnea on exertion Gastrointestinal ROS: no abdominal pain, change in bowel habits, or black or bloody stools Genito-Urinary  ROS: no dysuria, trouble voiding, or hematuria Musculoskeletal ROS: positive for - joint pain Neurological ROS: no TIA or stroke symptoms Dermatological ROS: negative All other ROS negative except what is listed above and in the HPI.      Objective:    BP 108/67 (BP Location: Left Arm, Patient Position: Sitting, Cuff Size: Normal)   Pulse 87   Temp 99 F (37.2 C)   Ht 5' 6.3" (1.684 m)   Wt 213 lb (96.6 kg)   SpO2 95%   BMI 34.07 kg/m   Wt Readings from Last 3 Encounters:  02/26/18 213 lb (96.6 kg)  01/22/18 211 lb 14.4 oz (96.1 kg)  12/19/17 211 lb (95.7 kg)    Physical Exam Vitals signs and nursing note reviewed.  Constitutional:      General: She is not in acute distress.    Appearance: She is well-developed.  HENT:     Head: Atraumatic.     Right Ear: External ear normal.     Left Ear: External ear normal.     Nose: Nose normal.     Mouth/Throat:     Pharynx: No oropharyngeal exudate.  Eyes:     General: No scleral icterus.    Conjunctiva/sclera: Conjunctivae normal.     Pupils: Pupils are equal, round, and reactive to light.  Neck:     Musculoskeletal: Normal range of motion and neck supple.     Thyroid: No thyromegaly.  Cardiovascular:     Rate and Rhythm: Normal rate and regular rhythm.     Heart sounds: Normal heart sounds.  Pulmonary:     Effort: Pulmonary effort is normal. No respiratory distress.     Breath sounds: Normal breath sounds.  Chest:     Breasts:        Right: No mass, skin change or tenderness.        Left: No mass, skin  change or tenderness.  Abdominal:     General: Bowel sounds are normal.     Palpations: Abdomen is soft. There is no mass.     Tenderness: There is no abdominal tenderness.  Musculoskeletal: Normal range of motion.        General: No tenderness.  Lymphadenopathy:     Cervical: No cervical adenopathy.  Skin:    General: Skin is warm and dry.     Findings: No rash.  Neurological:     Mental Status: She is alert and oriented to person, place, and time.     Cranial Nerves: No cranial nerve deficit.  Psychiatric:        Behavior: Behavior normal.     Results for orders placed or performed in visit on 02/26/18  UA/M w/rflx Culture, Routine  Result Value Ref Range   Specific Gravity, UA 1.015 1.005 - 1.030   pH, UA 5.5 5.0 - 7.5   Color, UA Yellow Yellow   Appearance Ur Clear Clear   Leukocytes, UA Negative Negative   Protein, UA Negative Negative/Trace   Glucose, UA Negative Negative   Ketones, UA Negative Negative   RBC, UA Negative Negative   Bilirubin, UA Negative Negative   Urobilinogen, Ur 0.2 0.2 - 1.0 mg/dL   Nitrite, UA Negative Negative      Assessment & Plan:   Problem List Items Addressed This Visit      Cardiovascular and Mediastinum   Essential hypertension - Primary   Relevant Orders   CBC with Differential/Platelet   Comprehensive metabolic panel     Respiratory  Asthma     Endocrine   Hypothyroidism     Musculoskeletal and Integument   DDD (degenerative disc disease), cervical (C5-6 and C6-7) (Chronic)   Relevant Orders   Ambulatory referral to Pain Clinic   DDD (degenerative disc disease), lumbar (L1-2 and L5-S1) (Chronic)   Relevant Orders   Ambulatory referral to Pain Clinic     Other   Chronic ankle pain (Primary Area of Pain) (Left) (Chronic)   Relevant Orders   Ambulatory referral to Pain Clinic   Hyperlipidemia   Relevant Orders   Lipid Panel w/o Chol/HDL Ratio   Major depressive disorder, single episode    Other Visit Diagnoses     Annual physical exam       Relevant Orders   TSH   UA/M w/rflx Culture, Routine (Completed)   Screening for breast cancer       Relevant Orders   MM DIGITAL SCREENING BILATERAL       Follow up plan: Return in about 6 months (around 08/27/2018) for 6 month f/u.   LABORATORY TESTING:  - Pap smear: up to date  IMMUNIZATIONS:   - Tdap: Tetanus vaccination status reviewed: last tetanus booster within 10 years. - Influenza: Up to date - Pneumovax: Not applicable - Prevnar: Not applicable - HPV: Not applicable - Zostavax vaccine: Refused  SCREENING: -Mammogram: Ordered today  - Colonoscopy: Up to date   PATIENT COUNSELING:   Advised to take 1 mg of folate supplement per day if capable of pregnancy.   Sexuality: Discussed sexually transmitted diseases, partner selection, use of condoms, avoidance of unintended pregnancy  and contraceptive alternatives.   Advised to avoid cigarette smoking.  I discussed with the patient that most people either abstain from alcohol or drink within safe limits (<=14/week and <=4 drinks/occasion for males, <=7/weeks and <= 3 drinks/occasion for females) and that the risk for alcohol disorders and other health effects rises proportionally with the number of drinks per week and how often a drinker exceeds daily limits.  Discussed cessation/primary prevention of drug use and availability of treatment for abuse.   Diet: Encouraged to adjust caloric intake to maintain  or achieve ideal body weight, to reduce intake of dietary saturated fat and total fat, to limit sodium intake by avoiding high sodium foods and not adding table salt, and to maintain adequate dietary potassium and calcium preferably from fresh fruits, vegetables, and low-fat dairy products.    stressed the importance of regular exercise  Injury prevention: Discussed safety belts, safety helmets, smoke detector, smoking near bedding or upholstery.   Dental health: Discussed importance of  regular tooth brushing, flossing, and dental visits.    NEXT PREVENTATIVE PHYSICAL DUE IN 1 YEAR. Return in about 6 months (around 08/27/2018) for 6 month f/u.

## 2018-02-26 NOTE — Patient Instructions (Signed)
You should receive a call to schedule with a new Pain Clinic  You can call [facility] to schedule your mammogram at [phone]

## 2018-02-27 ENCOUNTER — Encounter: Payer: Self-pay | Admitting: Family Medicine

## 2018-02-27 LAB — LIPID PANEL W/O CHOL/HDL RATIO
Cholesterol, Total: 179 mg/dL (ref 100–199)
HDL: 42 mg/dL (ref >39)
LDL Calculated: 109 mg/dL — ABNORMAL HIGH (ref 0–99)
Triglycerides: 139 mg/dL (ref 0–149)
VLDL Cholesterol Cal: 28 mg/dL (ref 5–40)

## 2018-02-27 LAB — CBC WITH DIFFERENTIAL/PLATELET
Basophils Absolute: 0 10*3/uL (ref 0.0–0.2)
Basos: 0 %
EOS (ABSOLUTE): 0.1 10*3/uL (ref 0.0–0.4)
Eos: 1 %
Hematocrit: 40.8 % (ref 34.0–46.6)
Hemoglobin: 13.6 g/dL (ref 11.1–15.9)
Immature Grans (Abs): 0 10*3/uL (ref 0.0–0.1)
Immature Granulocytes: 0 %
Lymphocytes Absolute: 2.7 10*3/uL (ref 0.7–3.1)
Lymphs: 29 %
MCH: 30.6 pg (ref 26.6–33.0)
MCHC: 33.3 g/dL (ref 31.5–35.7)
MCV: 92 fL (ref 79–97)
Monocytes Absolute: 0.6 10*3/uL (ref 0.1–0.9)
Monocytes: 6 %
Neutrophils Absolute: 5.7 10*3/uL (ref 1.4–7.0)
Neutrophils: 64 %
Platelets: 282 10*3/uL (ref 150–450)
RBC: 4.45 x10E6/uL (ref 3.77–5.28)
RDW: 14.3 % (ref 11.7–15.4)
WBC: 9.2 10*3/uL (ref 3.4–10.8)

## 2018-02-27 LAB — COMPREHENSIVE METABOLIC PANEL
ALT: 21 IU/L (ref 0–32)
AST: 18 IU/L (ref 0–40)
Albumin/Globulin Ratio: 1.8 (ref 1.2–2.2)
Albumin: 4.3 g/dL (ref 3.8–4.8)
Alkaline Phosphatase: 83 IU/L (ref 39–117)
BUN/Creatinine Ratio: 15 (ref 12–28)
BUN: 14 mg/dL (ref 8–27)
Bilirubin Total: 0.7 mg/dL (ref 0.0–1.2)
CO2: 21 mmol/L (ref 20–29)
Calcium: 9 mg/dL (ref 8.7–10.3)
Chloride: 102 mmol/L (ref 96–106)
Creatinine, Ser: 0.94 mg/dL (ref 0.57–1.00)
GFR calc Af Amer: 76 mL/min/{1.73_m2} (ref >59)
GFR calc non Af Amer: 66 mL/min/{1.73_m2} (ref >59)
Globulin, Total: 2.4 g/dL (ref 1.5–4.5)
Glucose: 113 mg/dL — ABNORMAL HIGH (ref 65–99)
Potassium: 4 mmol/L (ref 3.5–5.2)
Sodium: 138 mmol/L (ref 134–144)
Total Protein: 6.7 g/dL (ref 6.0–8.5)

## 2018-02-27 LAB — TSH: TSH: 2.65 u[IU]/mL (ref 0.450–4.500)

## 2018-03-02 ENCOUNTER — Telehealth: Payer: Self-pay | Admitting: Family Medicine

## 2018-03-02 NOTE — Telephone Encounter (Signed)
Copied from CRM [id number]. Topic: Quick Communication - See Telephone Encounter >> Mar 02, 2018 10:45 AM Fanny Bien wrote: CRM for notification. See Telephone encounter for: 03/02/18. Kathie Rhodes calling from preferred pain management called and stated that she can not schedule patient because she was previously a patient. Please advise Bettys Cb# [phone]

## 2018-03-02 NOTE — Telephone Encounter (Signed)
Please route referral to a different pain clinic

## 2018-03-22 NOTE — Telephone Encounter (Signed)
Pt called and stated that [facility] pain management also declined referral. Please advise Cb#[phone]

## 2018-03-22 NOTE — Telephone Encounter (Signed)
Please send to different pain clinic

## 2018-04-23 ENCOUNTER — Telehealth: Payer: Self-pay | Admitting: Family Medicine

## 2018-04-23 NOTE — Telephone Encounter (Signed)
LVM for pt to call back.

## 2018-04-23 NOTE — Telephone Encounter (Signed)
Please get her scheduled for video visit  Copied from CRM [id number]. Topic: Appointment Scheduling - Scheduling Inquiry for Clinic >> Apr 23, 2018  8:27 AM Kayla Wright wrote: Reason for CRM: Pt called requesting appt today. She woke up with her right eye red and pussy. Per FC advised pt that she will get a return call to see if video or in person visit. Pt noted ph# is a smartphone and I have updated email. Advised pt she will get a return call.

## 2018-04-24 NOTE — Telephone Encounter (Signed)
Spoke with pt. She is going to try to download app and give [place] a call back.

## 2018-04-25 ENCOUNTER — Other Ambulatory Visit: Payer: Self-pay

## 2018-04-25 ENCOUNTER — Ambulatory Visit (INDEPENDENT_AMBULATORY_CARE_PROVIDER_SITE_OTHER): Payer: Medicare Other | Admitting: Family Medicine

## 2018-04-25 ENCOUNTER — Encounter: Payer: Self-pay | Admitting: Family Medicine

## 2018-04-25 DIAGNOSIS — L237 Allergic contact dermatitis due to plants, except food: Secondary | ICD-10-CM | POA: Diagnosis not present

## 2018-04-25 MED ORDER — TRIAMCINOLONE ACETONIDE 0.1 % EX CREA: 1.0000 "application " | TOPICAL_CREAM | Freq: Two times a day (BID) | CUTANEOUS | 0 refills | Status: DC

## 2018-04-25 MED ORDER — PREDNISONE 10 MG PO TABS: ORAL_TABLET | ORAL | 0 refills | Status: DC

## 2018-04-25 NOTE — Progress Notes (Signed)
There were no vitals taken for this visit.   Subjective:    Patient ID: Kayla Wright, female    DOB: 07-19-1955, 63 y.o.   MRN: [id number]  HPI: Kayla Wright is a 63 y.o. female  Chief Complaint  Patient presents with  . Poison Ivy    Woke up monday with swollen eye. Right eye. Believes it may be poision oak or Ivy. Around eye on forehead and underbreas    . This visit was completed via telephone due to the restrictions of the COVID-19 pandemic. All issues as above were discussed and addressed but no physical exam was performed. If it was felt that the patient should be evaluated in the office, they were directed there. The patient verbally consented to this visit. Patient was unable to complete an audio/visual visit due to Technical difficulties,Lack of internet. Due to the catastrophic nature of the COVID-19 pandemic, this visit was done through audio contact only. . Location of the patient: home . Location of the provider: home . Those involved with this call:  . Provider: Roosvelt Maser, PA-C . CMA: Sheilah Mins, CMA . Front Desk/Registration: Harriet Pho  . Time spent on call: 15 minutes on the phone discussing health concerns   Skin around right eye red, swollen, itching, burning x2 days, now spreading down face, chest, and under breasts. Was trimming bushes over the weekend right before this started and thinks she got into some poison oak. Tried benadryl with no relief. No visual changes, eye pain, CP, SOB.   Relevant past medical, surgical, family and social history reviewed and updated as indicated. Interim medical history since our last visit reviewed. Allergies and medications reviewed and updated.  Review of Systems  Per HPI unless specifically indicated above     Objective:    There were no vitals taken for this visit.  Wt Readings from Last 3 Encounters:  02/26/18 213 lb (96.6 kg)  01/22/18 211 lb 14.4 oz (96.1 kg)  12/19/17 211 lb (95.7 kg)    Physical Exam  Unable to perform visual acuity testing or PE due to technical difficulties and COVID 19 restrictions.  Results for orders placed or performed in visit on 02/26/18  CBC with Differential/Platelet  Result Value Ref Range   WBC 9.2 3.4 - 10.8 x10E3/uL   RBC 4.45 3.77 - 5.28 x10E6/uL   Hemoglobin 13.6 11.1 - 15.9 g/dL   Hematocrit 03.0 13.1 - 46.6 %   MCV 92 79 - 97 fL   MCH 30.6 26.6 - 33.0 pg   MCHC 33.3 31.5 - 35.7 g/dL   RDW 43.8 88.7 - 57.9 %   Platelets 282 150 - 450 x10E3/uL   Neutrophils 64 Not Estab. %   Lymphs 29 Not Estab. %   Monocytes 6 Not Estab. %   Eos 1 Not Estab. %   Basos 0 Not Estab. %   Neutrophils Absolute 5.7 1.4 - 7.0 x10E3/uL   Lymphocytes Absolute 2.7 0.7 - 3.1 x10E3/uL   Monocytes Absolute 0.6 0.1 - 0.9 x10E3/uL   EOS (ABSOLUTE) 0.1 0.0 - 0.4 x10E3/uL   Basophils Absolute 0.0 0.0 - 0.2 x10E3/uL   Immature Granulocytes 0 Not Estab. %   Immature Grans (Abs) 0.0 0.0 - 0.1 x10E3/uL  Comprehensive metabolic panel  Result Value Ref Range   Glucose 113 (H) 65 - 99 mg/dL   BUN 14 8 - 27 mg/dL   Creatinine, Ser 7.28 0.57 - 1.00 mg/dL   GFR calc non Af Denyse Dago  66 >59 mL/min/1.73   GFR calc Af Amer 76 >59 mL/min/1.73   BUN/Creatinine Ratio 15 12 - 28   Sodium 138 134 - 144 mmol/L   Potassium 4.0 3.5 - 5.2 mmol/L   Chloride 102 96 - 106 mmol/L   CO2 21 20 - 29 mmol/L   Calcium 9.0 8.7 - 10.3 mg/dL   Total Protein 6.7 6.0 - 8.5 g/dL   Albumin 4.3 3.8 - 4.8 g/dL   Globulin, Total 2.4 1.5 - 4.5 g/dL   Albumin/Globulin Ratio 1.8 1.2 - 2.2   Bilirubin Total 0.7 0.0 - 1.2 mg/dL   Alkaline Phosphatase 83 39 - 117 IU/L   AST 18 0 - 40 IU/L   ALT 21 0 - 32 IU/L  Lipid Panel w/o Chol/HDL Ratio  Result Value Ref Range   Cholesterol, Total 179 100 - 199 mg/dL   Triglycerides 093 0 - 149 mg/dL   HDL 42 >23 mg/dL   VLDL Cholesterol Cal 28 5 - 40 mg/dL   LDL Calculated 557 (H) 0 - 99 mg/dL  TSH  Result Value Ref Range   TSH 2.650 0.450 - 4.500 uIU/mL  UA/M w/rflx  Culture, Routine  Result Value Ref Range   Specific Gravity, UA 1.015 1.005 - 1.030   pH, UA 5.5 5.0 - 7.5   Color, UA Yellow Yellow   Appearance Ur Clear Clear   Leukocytes, UA Negative Negative   Protein, UA Negative Negative/Trace   Glucose, UA Negative Negative   Ketones, UA Negative Negative   RBC, UA Negative Negative   Bilirubin, UA Negative Negative   Urobilinogen, Ur 0.2 0.2 - 1.0 mg/dL   Nitrite, UA Negative Negative      Assessment & Plan:   Problem List Items Addressed This Visit    None    Visit Diagnoses    Allergic contact dermatitis due to plants, except food    -  Primary   Tx with extended prednisone taper, benadryl BID, and triamcinolone cream BID prn everywhere other than near eyes. F/u if worsening or not improving       Follow up plan: Return for as scheduled.

## 2018-05-01 ENCOUNTER — Telehealth: Payer: Self-pay | Admitting: Family Medicine

## 2018-05-01 DIAGNOSIS — G4733 Obstructive sleep apnea (adult) (pediatric): Secondary | ICD-10-CM

## 2018-05-01 NOTE — Telephone Encounter (Signed)
Copied from CRM [id number]. Topic: General - Other >> May 01, 2018 11:17 AM Elliot Gault wrote: Caller name: Manda from [facility]  Relation to pt: Call back number: [phone] and fax # fax orders [phone]   Reason for call:  Due to patient insurance changing from commercial to [place]. Monia Pouch is requiring a face to face appointment to reflect a 6 minute walk or orders can be faxed over to a sleep study lab for  "CPAP titration".   [facility] provides patient with night time oxygen, please advise.

## 2018-05-03 NOTE — Telephone Encounter (Signed)
Called and left detailed message with information on patients vm. DPR was checked.   

## 2018-05-03 NOTE — Telephone Encounter (Signed)
Referral placed to [facility] for [facility]AP titration as requested by [facility]. Please let pt know

## 2018-05-05 IMAGING — CR DG LUMBAR SPINE COMPLETE 4+V
5 series · 5 of 5 positions shown · non-contrast
Comparison: None.

CLINICAL DATA: Low back pain

EXAM:
LUMBAR SPINE - COMPLETE 4+ VIEW

[l-spine ap]
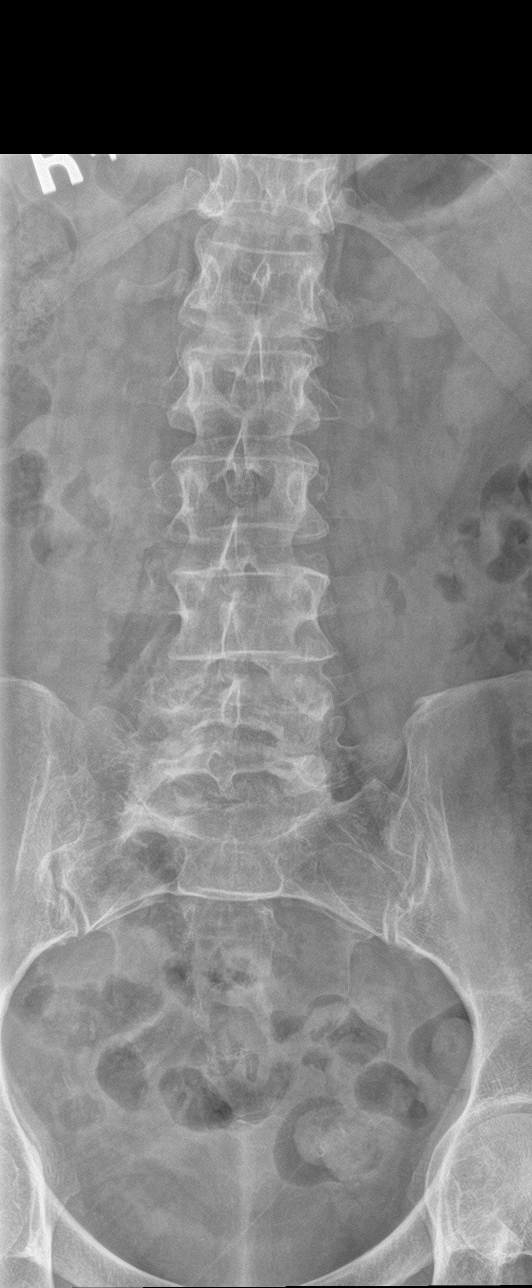

[l-spine obl (1 of 2)]
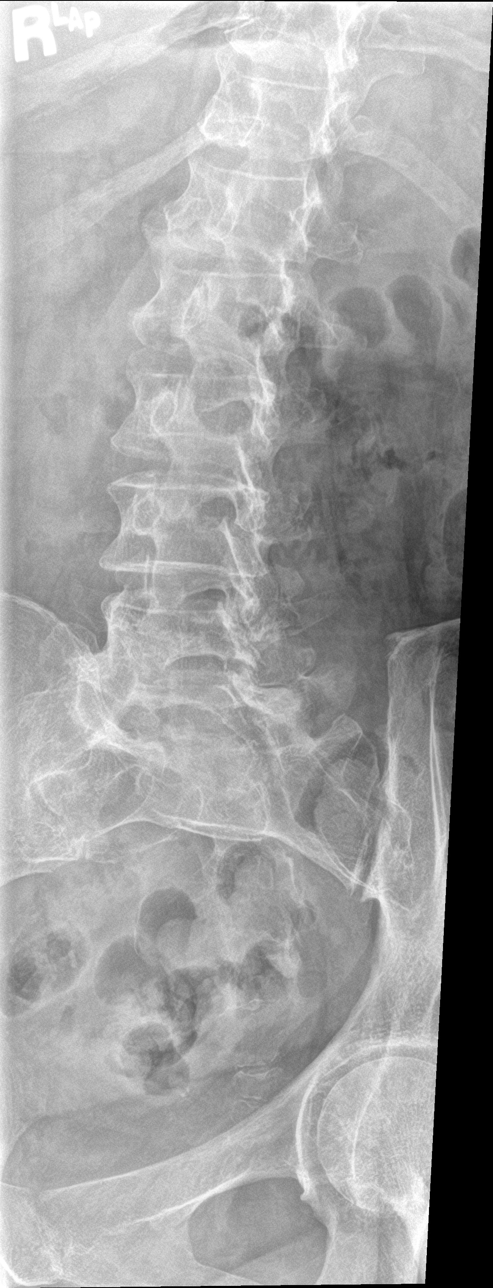

[l-spine obl (2 of 2)]
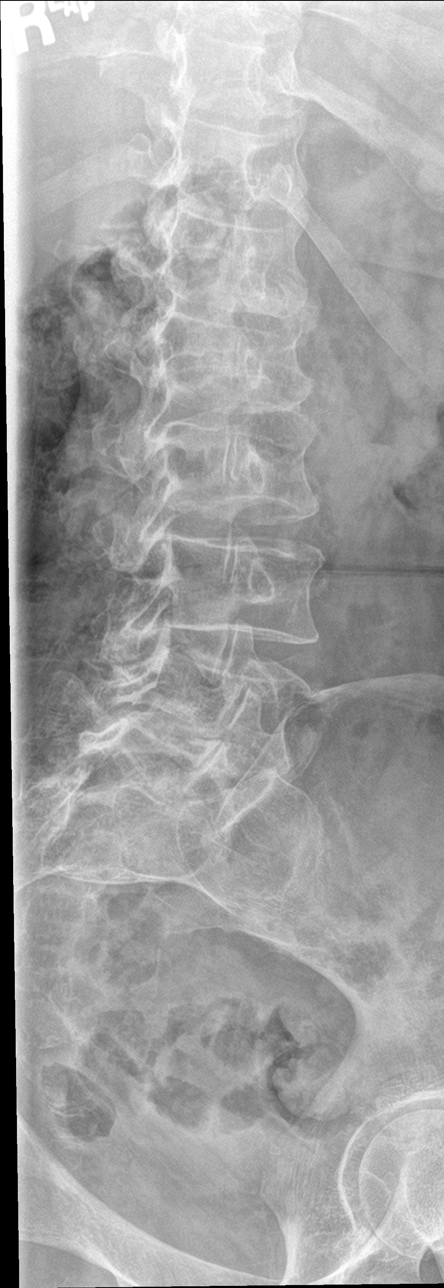

[l-spine lat]
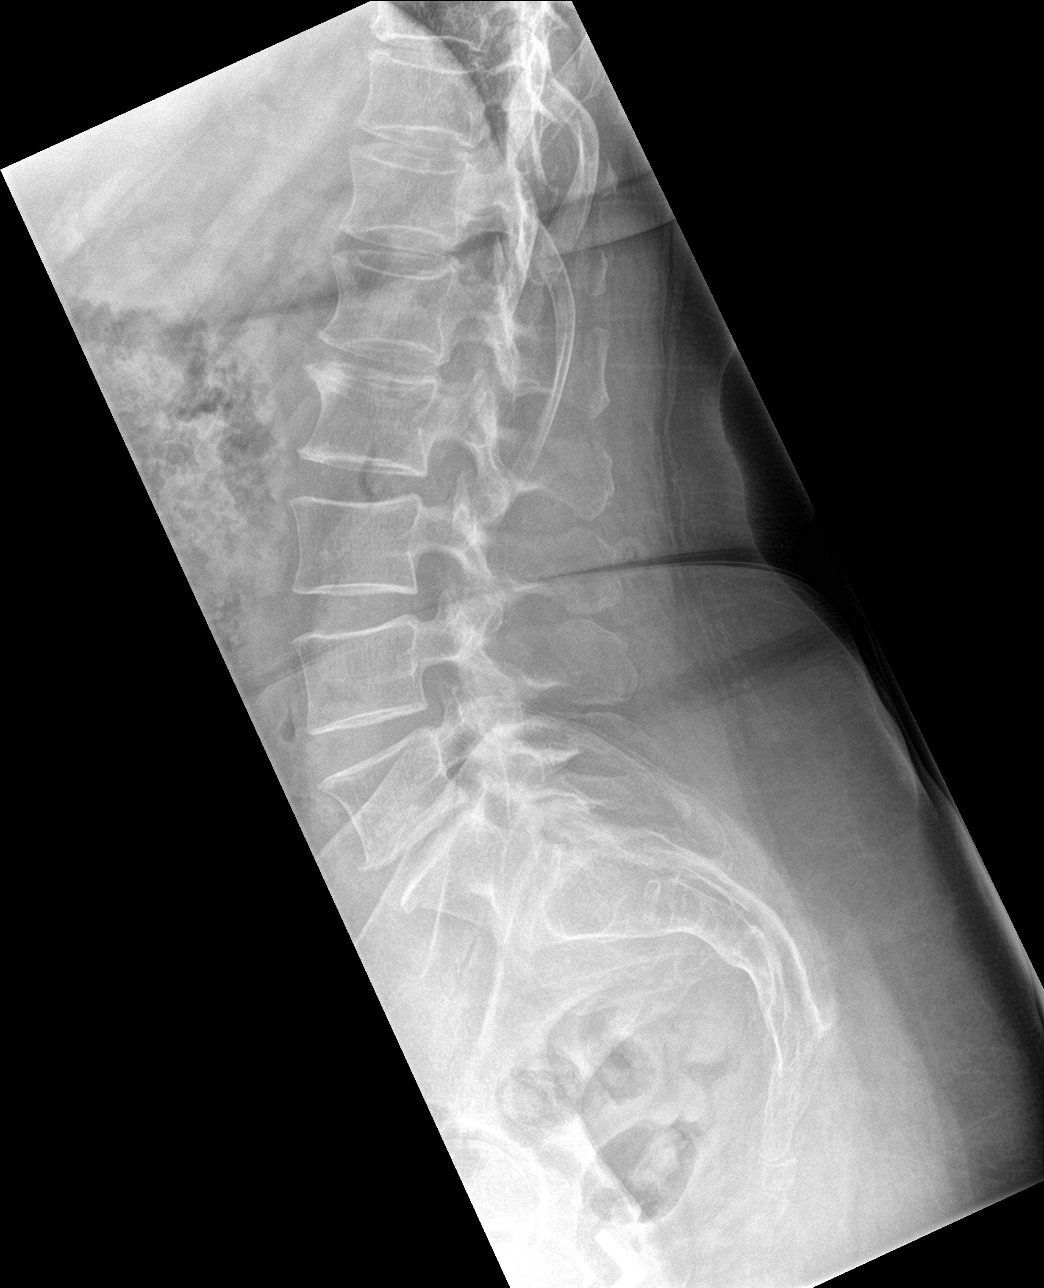

[l-spine spot]
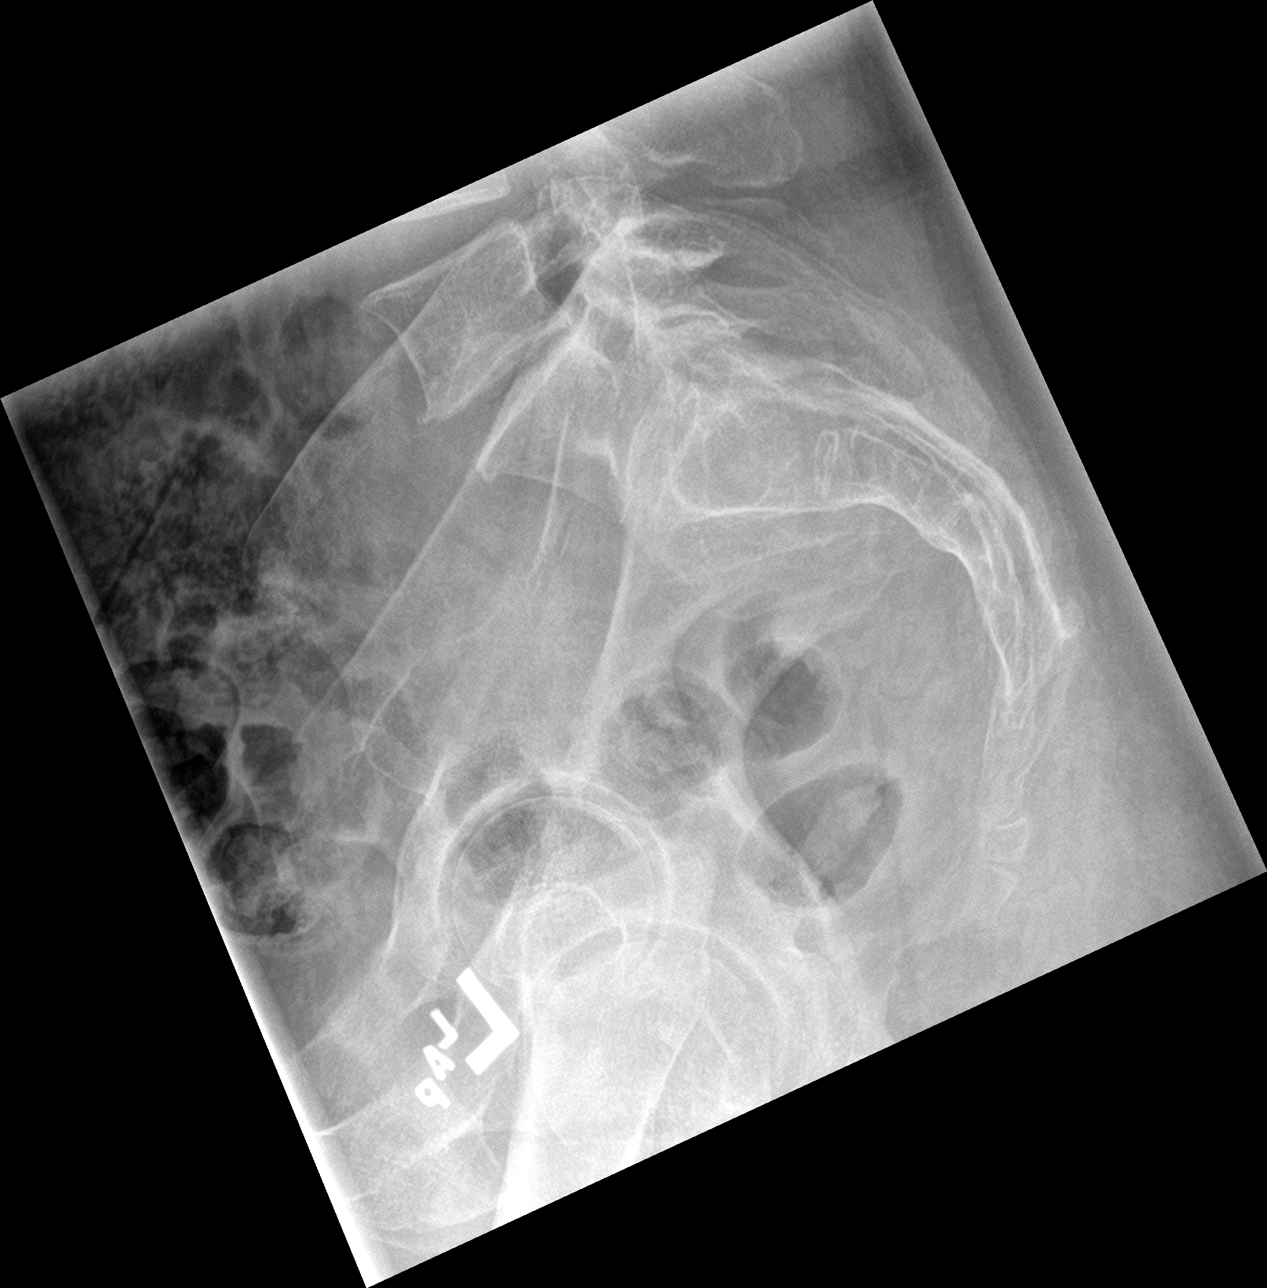

[5 of 5 positions shown; findings below may reference images not displayed]

FINDINGS: Bones are demineralized. No fracture. No subluxation. Loss of disc
height seen at L5-S1. Loss of disc height noted at L1-2.
IMPRESSION: Lower lumbar disc degeneration.  No fracture.

## 2018-05-10 NOTE — Telephone Encounter (Signed)
I thought she was getting a face to face with [facility] for this authorization?

## 2018-05-10 NOTE — Telephone Encounter (Signed)
Please see Kayla Wright's question.

## 2018-05-10 NOTE — Telephone Encounter (Signed)
Pt will need a face to face visit in order for insurance to cover CPAP titration, Thanks

## 2018-05-14 NOTE — Telephone Encounter (Signed)
Please get her scheduled for in office visit Wed - Fri this week for 6 min walk test

## 2018-05-14 NOTE — Telephone Encounter (Signed)
Scheduled 4/24 @ 1

## 2018-05-14 NOTE — Telephone Encounter (Signed)
Per Tiffany at [facility] they are needing to see what her oxygen drops to when she has walked for 6 mins. She states patient prefers to do this rather than CPAP titration which will also need documentation

## 2018-05-18 ENCOUNTER — Other Ambulatory Visit: Payer: Self-pay

## 2018-05-18 ENCOUNTER — Encounter: Payer: Self-pay | Admitting: Family Medicine

## 2018-05-18 ENCOUNTER — Ambulatory Visit (INDEPENDENT_AMBULATORY_CARE_PROVIDER_SITE_OTHER): Payer: Medicare Other | Admitting: Family Medicine

## 2018-05-18 VITALS — BP 116/69 | HR 83 | Temp 99.6°F

## 2018-05-18 DIAGNOSIS — G8929 Other chronic pain: Secondary | ICD-10-CM

## 2018-05-18 DIAGNOSIS — M5136 Other intervertebral disc degeneration, lumbar region: Secondary | ICD-10-CM | POA: Diagnosis not present

## 2018-05-18 DIAGNOSIS — G4730 Sleep apnea, unspecified: Secondary | ICD-10-CM | POA: Diagnosis not present

## 2018-05-18 DIAGNOSIS — M4802 Spinal stenosis, cervical region: Secondary | ICD-10-CM | POA: Diagnosis not present

## 2018-05-18 DIAGNOSIS — M25572 Pain in left ankle and joints of left foot: Secondary | ICD-10-CM

## 2018-05-18 DIAGNOSIS — M5030 Other cervical disc degeneration, unspecified cervical region: Secondary | ICD-10-CM | POA: Diagnosis not present

## 2018-05-18 NOTE — Assessment & Plan Note (Signed)
Known hx of nighttime desaturations. Referral placed back to [facility] for CPAP titration per [facility]'s recommendation to get her home O2 at nighttime covered. 6 min walk performed today resulted in lowest O2 saturation of 91% on room air. Will send this OV note to [facility] to see if nighttime O2 will be covered based on this information. Proceed with sleep referral if not

## 2018-05-18 NOTE — Progress Notes (Signed)
BP 116/69   Pulse 83   Temp 99.6 F (37.6 C) (Oral)   SpO2 94%    Subjective:    Patient ID: Kayla Wright, female    DOB: 14-Aug-1955, 63 y.o.   MRN: [id number]  HPI: Kayla Wright is a 63 y.o. female  Chief Complaint  Patient presents with  . Breathing Problem    pt states she was told to come to the office, states it is because of her oxygen levels at night. Completed 2 laps around the inside of the building, lowest O2 reading was 93%   Patient here today for 6 min walk test due to insurance requirements to cover her nighttime supplemental O2. Has issues with her respirations overnight which the O2 helps with. No DOE, wheezing, chest tightness, CP during daytime activities, only with sleep. Has undergone a sleep study in the past and dx'd with sleep apnea, currently on CPAP nightly.   Patient previously followed by pain specialist for her chronic back and left ankle issues, wanting a referral for a new clinic as [facility] denied her. Has been to [facility] and [facility] in the past. Wanting a new evaluation.   Relevant past medical, surgical, family and social history reviewed and updated as indicated. Interim medical history since our last visit reviewed. Allergies and medications reviewed and updated.  Review of Systems  Per HPI unless specifically indicated above     Objective:    BP 116/69   Pulse 83   Temp 99.6 F (37.6 C) (Oral)   SpO2 94%   Wt Readings from Last 3 Encounters:  02/26/18 213 lb (96.6 kg)  01/22/18 211 lb 14.4 oz (96.1 kg)  12/19/17 211 lb (95.7 kg)    Physical Exam Vitals signs and nursing note reviewed.  Constitutional:      Appearance: Normal appearance. She is not ill-appearing.  HENT:     Head: Atraumatic.  Eyes:     Extraocular Movements: Extraocular movements intact.     Conjunctiva/sclera: Conjunctivae normal.  Neck:     Musculoskeletal: Normal range of motion and neck supple.  Cardiovascular:     Rate and Rhythm: Normal rate  and regular rhythm.     Heart sounds: Normal heart sounds.  Pulmonary:     Effort: Pulmonary effort is normal.     Breath sounds: Normal breath sounds.  Musculoskeletal: Normal range of motion.  Skin:    General: Skin is warm and dry.  Neurological:     Mental Status: She is alert and oriented to person, place, and time.  Psychiatric:        Mood and Affect: Mood normal.        Thought Content: Thought content normal.        Judgment: Judgment normal.      6 Minute Walk Test Patient was monitored closely by Wilhemena Durie, CMA during a 6 minute walk down a hallway within the clinic. No supplemental O2 was used during testing. A pulse oximeter was worn throughout testing, which recorded 91% saturation at the lowest point and up to 95% at the highest point. Patient tolerated testing well without having to rest or slow down.     Results for orders placed or performed in visit on 02/26/18  CBC with Differential/Platelet  Result Value Ref Range   WBC 9.2 3.4 - 10.8 x10E3/uL   RBC 4.45 3.77 - 5.28 x10E6/uL   Hemoglobin 13.6 11.1 - 15.9 g/dL   Hematocrit 40.9 81.1 -  46.6 %   MCV 92 79 - 97 fL   MCH 30.6 26.6 - 33.0 pg   MCHC 33.3 31.5 - 35.7 g/dL   RDW 16.114.3 09.611.7 - 04.515.4 %   Platelets 282 150 - 450 x10E3/uL   Neutrophils 64 Not Estab. %   Lymphs 29 Not Estab. %   Monocytes 6 Not Estab. %   Eos 1 Not Estab. %   Basos 0 Not Estab. %   Neutrophils Absolute 5.7 1.4 - 7.0 x10E3/uL   Lymphocytes Absolute 2.7 0.7 - 3.1 x10E3/uL   Monocytes Absolute 0.6 0.1 - 0.9 x10E3/uL   EOS (ABSOLUTE) 0.1 0.0 - 0.4 x10E3/uL   Basophils Absolute 0.0 0.0 - 0.2 x10E3/uL   Immature Granulocytes 0 Not Estab. %   Immature Grans (Abs) 0.0 0.0 - 0.1 x10E3/uL  Comprehensive metabolic panel  Result Value Ref Range   Glucose 113 (H) 65 - 99 mg/dL   BUN 14 8 - 27 mg/dL   Creatinine, Ser 4.090.94 0.57 - 1.00 mg/dL   GFR calc non Af Amer 66 >59 mL/min/1.73   GFR calc Af Amer 76 >59 mL/min/1.73    BUN/Creatinine Ratio 15 12 - 28   Sodium 138 134 - 144 mmol/L   Potassium 4.0 3.5 - 5.2 mmol/L   Chloride 102 96 - 106 mmol/L   CO2 21 20 - 29 mmol/L   Calcium 9.0 8.7 - 10.3 mg/dL   Total Protein 6.7 6.0 - 8.5 g/dL   Albumin 4.3 3.8 - 4.8 g/dL   Globulin, Total 2.4 1.5 - 4.5 g/dL   Albumin/Globulin Ratio 1.8 1.2 - 2.2   Bilirubin Total 0.7 0.0 - 1.2 mg/dL   Alkaline Phosphatase 83 39 - 117 IU/L   AST 18 0 - 40 IU/L   ALT 21 0 - 32 IU/L  Lipid Panel w/o Chol/HDL Ratio  Result Value Ref Range   Cholesterol, Total 179 100 - 199 mg/dL   Triglycerides 811139 0 - 149 mg/dL   HDL 42 >91>39 mg/dL   VLDL Cholesterol Cal 28 5 - 40 mg/dL   LDL Calculated 478109 (H) 0 - 99 mg/dL  TSH  Result Value Ref Range   TSH 2.650 0.450 - 4.500 uIU/mL  UA/M w/rflx Culture, Routine  Result Value Ref Range   Specific Gravity, UA 1.015 1.005 - 1.030   pH, UA 5.5 5.0 - 7.5   Color, UA Yellow Yellow   Appearance Ur Clear Clear   Leukocytes, UA Negative Negative   Protein, UA Negative Negative/Trace   Glucose, UA Negative Negative   Ketones, UA Negative Negative   RBC, UA Negative Negative   Bilirubin, UA Negative Negative   Urobilinogen, Ur 0.2 0.2 - 1.0 mg/dL   Nitrite, UA Negative Negative      Assessment & Plan:   Problem List Items Addressed This Visit      Respiratory   Sleep apnea    Known hx of nighttime desaturations. Referral placed back to [facility] for CPAP titration per [employer]'s recommendation to get her home O2 at nighttime covered. 6 min walk performed today resulted in lowest O2 saturation of 91% on room air. Will send this OV note to [employer] to see if nighttime O2 will be covered based on this information. Proceed with sleep referral if not        Musculoskeletal and Integument   DDD (degenerative disc disease), cervical (C5-6 and C6-7) (Chronic)   Relevant Orders   Ambulatory referral to Pain Clinic   Chronic Cervical  foraminal stenosis (C3-4) (Right) (Chronic)   Relevant  Orders   Ambulatory referral to Pain Clinic   DDD (degenerative disc disease), lumbar (L1-2 and L5-S1) (Chronic)   Relevant Orders   Ambulatory referral to Pain Clinic     Other   Chronic ankle pain (Primary Area of Pain) (Left) - Primary (Chronic)   Relevant Orders   Ambulatory referral to Pain Clinic    New referral placed for pain management per patient request.    Greater than 25 min spent in direct care and coordination with patient today  Follow up plan: Return for as scheduled.

## 2018-05-19 ENCOUNTER — Other Ambulatory Visit: Payer: Self-pay | Admitting: Unknown Physician Specialty

## 2018-05-21 ENCOUNTER — Other Ambulatory Visit: Payer: Self-pay | Admitting: Family Medicine

## 2018-05-21 NOTE — Telephone Encounter (Signed)
Requested medication (s) are due for refill today: yes  Requested medication (s) are on the active medication list: yes  Last refill:  1/1*/2020  Future visit scheduled: no  Notes to clinic: not delegated    Requested Prescriptions  Pending Prescriptions Disp Refills   QUEtiapine Fumarate (SEROQUEL XR) 150 MG 24 hr tablet [Pharmacy Med Name: QUETIAPINE ER 150 MG TABLET] 90 tablet 0    Sig: Take 1 tablet (150 mg total) by mouth at bedtime.     Not Delegated - Psychiatry:  Antipsychotics - Second Generation (Atypical) - quetiapine Failed - 05/21/2018  1:52 PM      Failed - This refill cannot be delegated      Passed - ALT in normal range and within 180 days    ALT  Date Value Ref Range Status  02/26/2018 21 0 - 32 IU/L Final         Passed - AST in normal range and within 180 days    AST  Date Value Ref Range Status  02/26/2018 18 0 - 40 IU/L Final         Passed - Completed PHQ-2 or PHQ-9 in the last 360 days.      Passed - Last BP in normal range    BP Readings from Last 1 Encounters:  05/18/18 116/69         Passed - Valid encounter within last 6 months    Recent Outpatient Visits          3 days ago Chronic ankle pain (Primary Area of Pain) (Left)   [facility] Particia Nearing, PA-C   3 weeks ago Allergic contact dermatitis due to plants, except food   [facility] Particia Nearing, [place]   2 months ago Essential hypertension   [facility] Roosvelt Maser [place], [place]   3 months ago Moderate persistent asthma with acute exacerbation   [facility] Roosvelt Maser [place], [place]   5 months ago Moderate persistent asthma with acute exacerbation   [facility] [place], [place], DO

## 2018-05-22 DIAGNOSIS — G4733 Obstructive sleep apnea (adult) (pediatric): Secondary | ICD-10-CM | POA: Diagnosis not present

## 2018-05-30 NOTE — Telephone Encounter (Signed)
Called and spoke to patient. Message relayed. Scheduled a virtual visit for 05/31/18 at 11:15 am with Roosvelt Maser.

## 2018-05-30 NOTE — Telephone Encounter (Signed)
She should be working with [facility] about her CPAP machine, and she will need an appt to discuss sleep medications.

## 2018-05-30 NOTE — Telephone Encounter (Signed)
Patient calling to check and see when she will get her c-pap machine? She is not sleeping well at night at all and is very tired during the day. Also wants to know if she can get something called in to help with sleep?

## 2018-05-31 ENCOUNTER — Other Ambulatory Visit: Payer: Self-pay

## 2018-05-31 ENCOUNTER — Ambulatory Visit (INDEPENDENT_AMBULATORY_CARE_PROVIDER_SITE_OTHER): Payer: Medicare Other | Admitting: Family Medicine

## 2018-05-31 ENCOUNTER — Encounter: Payer: Self-pay | Admitting: Family Medicine

## 2018-05-31 DIAGNOSIS — F5101 Primary insomnia: Secondary | ICD-10-CM

## 2018-05-31 MED ORDER — HYDROXYZINE HCL 25 MG PO TABS: 25.0000 mg | ORAL_TABLET | Freq: Every evening | ORAL | 0 refills | Status: DC | PRN

## 2018-05-31 NOTE — Progress Notes (Signed)
There were no vitals taken for this visit.   Subjective:    Patient ID: Kayla Wright, female    DOB: 09/30/1955, 63 y.o.   MRN: [id number]  HPI: Kayla Wright is a 63 y.o. female  Chief Complaint  Patient presents with  . Insomnia    . This visit was completed via telephone due to the restrictions of the COVID-19 pandemic. All issues as above were discussed and addressed but no physical exam was performed. If it was felt that the patient should be evaluated in the office, they were directed there. The patient verbally consented to this visit. Patient was unable to complete an audio/visual visit due to Technical difficulties,Lack of internet. Due to the catastrophic nature of the COVID-19 pandemic, this visit was done through audio contact only. . Location of the patient: home . Location of the provider: work . Those involved with this call:  . Provider: Roosvelt Maserachel Lane, PA-C . CMA: Wilhemena DurieBrittany Russell, CMA . Front Desk/Registration: Harriet PhoJoliza Johnson  . Time spent on call: 15 minutes on the phone discussing health concerns. 5 minutes total spent in review of patient's record and preparation of their chart. I verified patient identity using two factors (patient name and date of birth). Patient consents verbally to being seen via telemedicine visit today.   Presenting today c/o about 4 months of worsening insomnia issues. Waking up about every 30 min and taking over an hour to fall back asleep. Significant daytime fatigue, brain fog, irritability. Awaiting CPAP from [facility] to treat her sleep apnea. Trying melatonin OTC without relief, has never tried anything else. Denies new stressors, medication changes, diet or lifestyle changes.   Relevant past medical, surgical, family and social history reviewed and updated as indicated. Interim medical history since our last visit reviewed. Allergies and medications reviewed and updated.  Review of Systems  Per HPI unless specifically indicated  above     Objective:    There were no vitals taken for this visit.  Wt Readings from Last 3 Encounters:  02/26/18 213 lb (96.6 kg)  01/22/18 211 lb 14.4 oz (96.1 kg)  12/19/17 211 lb (95.7 kg)    Physical Exam  Unable to perform PE due to technical difficulties connecting with video technology today  Results for orders placed or performed in visit on 02/26/18  CBC with Differential/Platelet  Result Value Ref Range   WBC 9.2 3.4 - 10.8 x10E3/uL   RBC 4.45 3.77 - 5.28 x10E6/uL   Hemoglobin 13.6 11.1 - 15.9 g/dL   Hematocrit 78.240.8 95.634.0 - 46.6 %   MCV 92 79 - 97 fL   MCH 30.6 26.6 - 33.0 pg   MCHC 33.3 31.5 - 35.7 g/dL   RDW 21.314.3 08.611.7 - 57.815.4 %   Platelets 282 150 - 450 x10E3/uL   Neutrophils 64 Not Estab. %   Lymphs 29 Not Estab. %   Monocytes 6 Not Estab. %   Eos 1 Not Estab. %   Basos 0 Not Estab. %   Neutrophils Absolute 5.7 1.4 - 7.0 x10E3/uL   Lymphocytes Absolute 2.7 0.7 - 3.1 x10E3/uL   Monocytes Absolute 0.6 0.1 - 0.9 x10E3/uL   EOS (ABSOLUTE) 0.1 0.0 - 0.4 x10E3/uL   Basophils Absolute 0.0 0.0 - 0.2 x10E3/uL   Immature Granulocytes 0 Not Estab. %   Immature Grans (Abs) 0.0 0.0 - 0.1 x10E3/uL  Comprehensive metabolic panel  Result Value Ref Range   Glucose 113 (H) 65 - 99 mg/dL   BUN  14 8 - 27 mg/dL   Creatinine, Ser 8.28 0.57 - 1.00 mg/dL   GFR calc non Af Amer 66 >59 mL/min/1.73   GFR calc Af Amer 76 >59 mL/min/1.73   BUN/Creatinine Ratio 15 12 - 28   Sodium 138 134 - 144 mmol/L   Potassium 4.0 3.5 - 5.2 mmol/L   Chloride 102 96 - 106 mmol/L   CO2 21 20 - 29 mmol/L   Calcium 9.0 8.7 - 10.3 mg/dL   Total Protein 6.7 6.0 - 8.5 g/dL   Albumin 4.3 3.8 - 4.8 g/dL   Globulin, Total 2.4 1.5 - 4.5 g/dL   Albumin/Globulin Ratio 1.8 1.2 - 2.2   Bilirubin Total 0.7 0.0 - 1.2 mg/dL   Alkaline Phosphatase 83 39 - 117 IU/L   AST 18 0 - 40 IU/L   ALT 21 0 - 32 IU/L  Lipid Panel w/o Chol/HDL Ratio  Result Value Ref Range   Cholesterol, Total 179 100 - 199 mg/dL    Triglycerides 833 0 - 149 mg/dL   HDL 42 >74 mg/dL   VLDL Cholesterol Cal 28 5 - 40 mg/dL   LDL Calculated 451 (H) 0 - 99 mg/dL  TSH  Result Value Ref Range   TSH 2.650 0.450 - 4.500 uIU/mL  UA/M w/rflx Culture, Routine  Result Value Ref Range   Specific Gravity, UA 1.015 1.005 - 1.030   pH, UA 5.5 5.0 - 7.5   Color, UA Yellow Yellow   Appearance Ur Clear Clear   Leukocytes, UA Negative Negative   Protein, UA Negative Negative/Trace   Glucose, UA Negative Negative   Ketones, UA Negative Negative   RBC, UA Negative Negative   Bilirubin, UA Negative Negative   Urobilinogen, Ur 0.2 0.2 - 1.0 mg/dL   Nitrite, UA Negative Negative      Assessment & Plan:   Problem List Items Addressed This Visit      Other   Insomnia - Primary    Suspect largely related to not having a CPAP at the moment. Will trial hydroxyzine at bedtime prn, good sleep hygiene. Await CPAP order through sleep medicine.           Follow up plan: Return for as scheduled.

## 2018-05-31 NOTE — Assessment & Plan Note (Signed)
Suspect largely related to not having a CPAP at the moment. Will trial hydroxyzine at bedtime prn, good sleep hygiene. Await CPAP order through sleep medicine.

## 2018-06-04 DIAGNOSIS — G4733 Obstructive sleep apnea (adult) (pediatric): Secondary | ICD-10-CM | POA: Diagnosis not present

## 2018-06-27 ENCOUNTER — Other Ambulatory Visit: Payer: Self-pay | Admitting: Family Medicine

## 2018-06-27 NOTE — Telephone Encounter (Signed)
Requested Prescriptions  Pending Prescriptions Disp Refills  . hydrOXYzine (ATARAX/VISTARIL) 25 MG tablet [Pharmacy Med Name: HYDROXYZINE HCL 25 MG TABLET] 30 tablet 0    Sig: Take 1 tablet (25 mg total) by mouth at bedtime as needed.     Ear, Nose, and Throat:  Antihistamines Passed - 06/27/2018 11:42 AM      Passed - Valid encounter within last 12 months    Recent Outpatient Visits          3 weeks ago Primary insomnia   [facility] Roosvelt Maser [place], [place]   1 month ago Chronic ankle pain (Primary Area of Pain) (Left)   [facility] Part[facility] Nearing, [place]   2 months ago Allergic contact dermatitis due to plants, except food   [facility] Particia Nearing, [place]   4 months ago Essential hypertension   [facility] Roosvelt Maser [place], [place]   5 months ago Moderate persistent asthma with acute exacerbation   [facility] Particia Nearing, [place]      Future Appointments            In 5 days Maurice March, Salley Hews, PA-C [facility]

## 2018-06-29 NOTE — Telephone Encounter (Signed)
Pt has to have cpap titration due to medicare guidelines in order to keep oxygen. If pt does not have titration completed the oxygen equipment will need to be picked up. Please advise on orders for cpap titration to be done.   Marchelle Folks w/[employer] (Other) [phone]

## 2018-07-02 ENCOUNTER — Ambulatory Visit (INDEPENDENT_AMBULATORY_CARE_PROVIDER_SITE_OTHER): Payer: Medicare Other | Admitting: Family Medicine

## 2018-07-02 ENCOUNTER — Encounter: Payer: Self-pay | Admitting: Family Medicine

## 2018-07-02 ENCOUNTER — Other Ambulatory Visit: Payer: Self-pay

## 2018-07-02 ENCOUNTER — Telehealth: Payer: Self-pay | Admitting: Family Medicine

## 2018-07-02 DIAGNOSIS — G4730 Sleep apnea, unspecified: Secondary | ICD-10-CM

## 2018-07-02 DIAGNOSIS — F5101 Primary insomnia: Secondary | ICD-10-CM

## 2018-07-02 MED ORDER — LEVOTHYROXINE SODIUM 50 MCG PO TABS: ORAL_TABLET | ORAL | 1 refills | Status: DC

## 2018-07-02 MED ORDER — HYDROXYZINE HCL 25 MG PO TABS: 25.0000 mg | ORAL_TABLET | Freq: Every evening | ORAL | 1 refills | Status: DC | PRN

## 2018-07-02 MED ORDER — FLUOXETINE HCL 40 MG PO CAPS: 40.0000 mg | ORAL_CAPSULE | Freq: Every day | ORAL | 1 refills | Status: DC

## 2018-07-02 NOTE — Telephone Encounter (Signed)
Apolonio Schneiders, did you discuss about this message with patient today at her virtual visit?

## 2018-07-02 NOTE — Telephone Encounter (Signed)
Patient has been working with [facility] on this

## 2018-07-02 NOTE — Telephone Encounter (Signed)
Noted - this may pertain to my other message feed about her CPAP so will route to clinical team for review as well  Copied from [place] [id number]. Topic: General - Other >> Jul 02, 2018 12:29 PM Rainey Pines A wrote: Patient called to let provider know that she has an appointment scheduled for 07/16/2018 at the sleep center.

## 2018-07-02 NOTE — Telephone Encounter (Signed)
I didn't get into specifics during our visit today but she said she now had her CPAP machine and was doing well. Please call her to confirm she's in with [facility] and getting what she needs to close this out as this situation has been ongoing back and forth for months

## 2018-07-02 NOTE — Telephone Encounter (Signed)
Pt scheduled to see [facility] June 22nd per pt. Does not need anything from [place] at this time.

## 2018-07-02 NOTE — Progress Notes (Signed)
There were no vitals taken for this visit.   Subjective:    Patient ID: Kayla Wright, female    DOB: 02/01/1955, 63 y.o.   MRN: [id number]  HPI: Kayla Wright is a 63 y.o. female  Chief Complaint  Patient presents with  . Insomnia    . This visit was completed via WebEx due to the restrictions of the COVID-19 pandemic. All issues as above were discussed and addressed. Physical exam was done as above through visual confirmation on WebEx. If it was felt that the patient should be evaluated in the office, they were directed there. The patient verbally consented to this visit. . Location of the patient: home . Location of the provider: home . Those involved with this call:  . Provider: Roosvelt Maserachel Lanetra Hartley, PA-C . CMA: Tiffany Reel, CMA . Front Desk/Registration: Harriet PhoJoliza Johnson  . Time spent on call: 15 minutes with patient face to face via video conference. More than 50% of this time was spent in counseling and coordination of care. 5 minutes total spent in review of patient's record and preparation of their chart. I verified patient identity using two factors (patient name and date of birth). Patient consents verbally to being seen via telemedicine visit today.   Here today for 1 month sleep f/u after adding hydroxyzine prn at bedtime. Taking 1 tab nightly with good relief. Denies grogginess, side effects, restlessness, bad dreams. Also notes she finally got her CPAP through sleep medicine and this has helped a lot.   Relevant past medical, surgical, family and social history reviewed and updated as indicated. Interim medical history since our last visit reviewed. Allergies and medications reviewed and updated.  Review of Systems  Per HPI unless specifically indicated above     Objective:    There were no vitals taken for this visit.  Wt Readings from Last 3 Encounters:  02/26/18 213 lb (96.6 kg)  01/22/18 211 lb 14.4 oz (96.1 kg)  12/19/17 211 lb (95.7 kg)    Physical Exam Vitals  signs and nursing note reviewed.  Constitutional:      General: She is not in acute distress.    Appearance: Normal appearance.  HENT:     Head: Atraumatic.     Right Ear: External ear normal.     Left Ear: External ear normal.     Nose: Nose normal. No congestion.     Mouth/Throat:     Mouth: Mucous membranes are moist.     Pharynx: Oropharynx is clear. No posterior oropharyngeal erythema.  Eyes:     Extraocular Movements: Extraocular movements intact.     Conjunctiva/sclera: Conjunctivae normal.  Neck:     Musculoskeletal: Normal range of motion.  Cardiovascular:     Comments: Unable to assess via virtual visit Pulmonary:     Effort: Pulmonary effort is normal. No respiratory distress.  Musculoskeletal: Normal range of motion.  Skin:    General: Skin is dry.     Findings: No erythema.  Neurological:     Mental Status: She is alert and oriented to person, place, and time.  Psychiatric:        Mood and Affect: Mood normal.        Thought Content: Thought content normal.        Judgment: Judgment normal.     Results for orders placed or performed in visit on 02/26/18  CBC with Differential/Platelet  Result Value Ref Range   WBC 9.2 3.4 - 10.8 x10E3/uL   RBC  4.45 3.77 - 5.28 x10E6/uL   Hemoglobin 13.6 11.1 - 15.9 g/dL   Hematocrit 40.8 34.0 - 46.6 %   MCV 92 79 - 97 fL   MCH 30.6 26.6 - 33.0 pg   MCHC 33.3 31.5 - 35.7 g/dL   RDW 14.3 11.7 - 15.4 %   Platelets 282 150 - 450 x10E3/uL   Neutrophils 64 Not Estab. %   Lymphs 29 Not Estab. %   Monocytes 6 Not Estab. %   Eos 1 Not Estab. %   Basos 0 Not Estab. %   Neutrophils Absolute 5.7 1.4 - 7.0 x10E3/uL   Lymphocytes Absolute 2.7 0.7 - 3.1 x10E3/uL   Monocytes Absolute 0.6 0.1 - 0.9 x10E3/uL   EOS (ABSOLUTE) 0.1 0.0 - 0.4 x10E3/uL   Basophils Absolute 0.0 0.0 - 0.2 x10E3/uL   Immature Granulocytes 0 Not Estab. %   Immature Grans (Abs) 0.0 0.0 - 0.1 x10E3/uL  Comprehensive metabolic panel  Result Value Ref Range    Glucose 113 (H) 65 - 99 mg/dL   BUN 14 8 - 27 mg/dL   Creatinine, Ser 0.94 0.57 - 1.00 mg/dL   GFR calc non Af Amer 66 >59 mL/min/1.73   GFR calc Af Amer 76 >59 mL/min/1.73   BUN/Creatinine Ratio 15 12 - 28   Sodium 138 134 - 144 mmol/L   Potassium 4.0 3.5 - 5.2 mmol/L   Chloride 102 96 - 106 mmol/L   CO2 21 20 - 29 mmol/L   Calcium 9.0 8.7 - 10.3 mg/dL   Total Protein 6.7 6.0 - 8.5 g/dL   Albumin 4.3 3.8 - 4.8 g/dL   Globulin, Total 2.4 1.5 - 4.5 g/dL   Albumin/Globulin Ratio 1.8 1.2 - 2.2   Bilirubin Total 0.7 0.0 - 1.2 mg/dL   Alkaline Phosphatase 83 39 - 117 IU/L   AST 18 0 - 40 IU/L   ALT 21 0 - 32 IU/L  Lipid Panel w/o Chol/HDL Ratio  Result Value Ref Range   Cholesterol, Total 179 100 - 199 mg/dL   Triglycerides 139 0 - 149 mg/dL   HDL 42 >39 mg/dL   VLDL Cholesterol Cal 28 5 - 40 mg/dL   LDL Calculated 109 (H) 0 - 99 mg/dL  TSH  Result Value Ref Range   TSH 2.650 0.450 - 4.500 uIU/mL  UA/M w/rflx Culture, Routine   Specimen: Urine   URINE  Result Value Ref Range   Specific Gravity, UA 1.015 1.005 - 1.030   pH, UA 5.5 5.0 - 7.5   Color, UA Yellow Yellow   Appearance Ur Clear Clear   Leukocytes, UA Negative Negative   Protein, UA Negative Negative/Trace   Glucose, UA Negative Negative   Ketones, UA Negative Negative   RBC, UA Negative Negative   Bilirubin, UA Negative Negative   Urobilinogen, Ur 0.2 0.2 - 1.0 mg/dL   Nitrite, UA Negative Negative      Assessment & Plan:   Problem List Items Addressed This Visit      Respiratory   Sleep apnea    Working with [facility], using CPAP device with good benefit        Other   Insomnia - Primary    Good improvement with hydroxyzine QHS prn. Continue current regimen          Follow up plan: Return for as scheduled.

## 2018-07-05 DIAGNOSIS — G4733 Obstructive sleep apnea (adult) (pediatric): Secondary | ICD-10-CM | POA: Diagnosis not present

## 2018-07-05 NOTE — Assessment & Plan Note (Signed)
Working with [employer], using CPAP device with good benefit

## 2018-07-05 NOTE — Assessment & Plan Note (Signed)
Good improvement with hydroxyzine QHS prn. Continue current regimen

## 2018-07-16 DIAGNOSIS — G4733 Obstructive sleep apnea (adult) (pediatric): Secondary | ICD-10-CM | POA: Diagnosis not present

## 2018-07-25 DIAGNOSIS — I10 Essential (primary) hypertension: Secondary | ICD-10-CM | POA: Diagnosis not present

## 2018-07-25 DIAGNOSIS — R0602 Shortness of breath: Secondary | ICD-10-CM | POA: Diagnosis not present

## 2018-07-25 DIAGNOSIS — E7800 Pure hypercholesterolemia, unspecified: Secondary | ICD-10-CM | POA: Diagnosis not present

## 2018-07-25 DIAGNOSIS — G4733 Obstructive sleep apnea (adult) (pediatric): Secondary | ICD-10-CM | POA: Diagnosis not present

## 2018-07-25 DIAGNOSIS — R079 Chest pain, unspecified: Secondary | ICD-10-CM | POA: Diagnosis not present

## 2018-08-01 ENCOUNTER — Ambulatory Visit (INDEPENDENT_AMBULATORY_CARE_PROVIDER_SITE_OTHER): Payer: Medicare Other | Admitting: Family Medicine

## 2018-08-01 ENCOUNTER — Other Ambulatory Visit: Payer: Self-pay

## 2018-08-01 ENCOUNTER — Encounter: Payer: Self-pay | Admitting: Family Medicine

## 2018-08-01 VITALS — BP 124/77 | HR 69 | Temp 98.3°F | Ht 68.0 in | Wt 217.2 lb

## 2018-08-01 DIAGNOSIS — E785 Hyperlipidemia, unspecified: Secondary | ICD-10-CM

## 2018-08-01 DIAGNOSIS — I10 Essential (primary) hypertension: Secondary | ICD-10-CM

## 2018-08-01 DIAGNOSIS — G4730 Sleep apnea, unspecified: Secondary | ICD-10-CM | POA: Diagnosis not present

## 2018-08-01 DIAGNOSIS — J4541 Moderate persistent asthma with (acute) exacerbation: Secondary | ICD-10-CM | POA: Diagnosis not present

## 2018-08-01 DIAGNOSIS — F5101 Primary insomnia: Secondary | ICD-10-CM

## 2018-08-01 DIAGNOSIS — F322 Major depressive disorder, single episode, severe without psychotic features: Secondary | ICD-10-CM

## 2018-08-01 NOTE — Progress Notes (Signed)
BP 124/77 (BP Location: Left Arm, Patient Position: Sitting, Cuff Size: Normal)   Pulse 69   Temp 98.3 F (36.8 C) (Oral)   Ht 5\' 8"  (1.727 m)   Wt 217 lb 4 oz (98.5 kg)   SpO2 96%   BMI 33.03 kg/m    Subjective:    Patient ID: LEXXI KOSLOW, female    DOB: Mar 29, 1955, 63 y.o.   MRN: [id number]  HPI: BLEN RANSOME is a 63 y.o. female  Chief Complaint  Patient presents with  . Insomnia    F/u. doing better   Patient presenting today for 6 month f/u chronic conditions. Doing well, no concerns. Taking medications faithfully without side effects. Denies CP, SOB, HAs, depressed moods, sleep issues.   HTN - home BPs 120s/70s when checked. Tolerating well. Staying active and eating healthy foods for the most part.   HLD - On simvastatin, tolerating well without myalgias.   Moods under good control with seroquel XR, prozac regimen. Now taking hydroxyzine at bedtime for sleep which seems to be working very well without any side effects or daytime grogginess. Back on nighttime CPAP which has improved sleep quality greatly.   Breathing stable on anoro and albuterol prn regimen. No recent exacerbations.   Depression screen [facility] 2/9 08/01/2018 02/26/2018 09/21/2017  Decreased Interest 1 2 0  Down, Depressed, Hopeless 2 0 0  PHQ - 2 Score 3 2 0  Altered sleeping 2 3 3   Tired, decreased energy 2 3 2   Change in appetite 3 0 1  Feeling bad or failure about yourself  1 1 1   Trouble concentrating 2 0 1  Moving slowly or fidgety/restless 0 0 1  Suicidal thoughts 0 0 0  PHQ-9 Score 13 9 9   Difficult doing work/chores - Not difficult at all -  Some recent data might be hidden   GAD 7 : Generalized Anxiety Score 09/21/2017 08/25/2017 04/13/2015  Nervous, Anxious, on Edge 2 1 3   Control/stop worrying 1 0 2  Worry too much - different things 2 2 3   Trouble relaxing 2 2 3   Restless 1 2 3   Easily annoyed or irritable 1 3 2   Afraid - awful might happen 1 2 2   Total GAD 7 Score 10 12 18   Anxiety  Difficulty - - Very difficult  Some encounter information is confidential and restricted. Go to Review Flowsheets activity to see all data.   Relevant past medical, surgical, family and social history reviewed and updated as indicated. Interim medical history since our last visit reviewed. Allergies and medications reviewed and updated.  Review of Systems  Per HPI unless specifically indicated above     Objective:    BP 124/77 (BP Location: Left Arm, Patient Position: Sitting, Cuff Size: Normal)   Pulse 69   Temp 98.3 F (36.8 C) (Oral)   Ht 5\' 8"  (1.727 m)   Wt 217 lb 4 oz (98.5 kg)   SpO2 96%   BMI 33.03 kg/m   Wt Readings from Last 3 Encounters:  08/01/18 217 lb 4 oz (98.5 kg)  02/26/18 213 lb (96.6 kg)  01/22/18 211 lb 14.4 oz (96.1 kg)    Physical Exam Vitals signs and nursing note reviewed.  Constitutional:      Appearance: Normal appearance. She is not ill-appearing.  HENT:     Head: Atraumatic.  Eyes:     Extraocular Movements: Extraocular movements intact.     Conjunctiva/sclera: Conjunctivae normal.  Neck:     Musculoskeletal:  Normal range of motion and neck supple.  Cardiovascular:     Rate and Rhythm: Normal rate and regular rhythm.     Heart sounds: Normal heart sounds.  Pulmonary:     Effort: Pulmonary effort is normal.     Breath sounds: Normal breath sounds.  Musculoskeletal: Normal range of motion.  Skin:    General: Skin is warm and dry.  Neurological:     Mental Status: She is alert and oriented to person, place, and time.  Psychiatric:        Mood and Affect: Mood normal.        Thought Content: Thought content normal.        Judgment: Judgment normal.    Results for orders placed or performed in visit on 08/01/18  Comprehensive metabolic panel  Result Value Ref Range   Glucose 107 (H) 65 - 99 mg/dL   BUN 11 8 - 27 mg/dL   Creatinine, Ser 1.610.98 0.57 - 1.00 mg/dL   GFR calc non Af Amer 62 >59 mL/min/1.73   GFR calc Af Amer 71 >59  mL/min/1.73   BUN/Creatinine Ratio 11 (L) 12 - 28   Sodium 142 134 - 144 mmol/L   Potassium 4.2 3.5 - 5.2 mmol/L   Chloride 104 96 - 106 mmol/L   CO2 23 20 - 29 mmol/L   Calcium 9.1 8.7 - 10.3 mg/dL   Total Protein 6.5 6.0 - 8.5 g/dL   Albumin 4.3 3.8 - 4.8 g/dL   Globulin, Total 2.2 1.5 - 4.5 g/dL   Albumin/Globulin Ratio 2.0 1.2 - 2.2   Bilirubin Total 0.7 0.0 - 1.2 mg/dL   Alkaline Phosphatase 75 39 - 117 IU/L   AST 21 0 - 40 IU/L   ALT 22 0 - 32 IU/L  Lipid Panel w/o Chol/HDL Ratio  Result Value Ref Range   Cholesterol, Total 167 100 - 199 mg/dL   Triglycerides 096155 (H) 0 - 149 mg/dL   HDL 46 >04>39 mg/dL   VLDL Cholesterol Cal 31 5 - 40 mg/dL   LDL Calculated 90 0 - 99 mg/dL      Assessment & Plan:   Problem List Items Addressed This Visit      Cardiovascular and Mediastinum   Essential hypertension    Stable and WNL, continue current regimen        Respiratory   Sleep apnea    Stable and under good control, followed by Feeling Great. Continue current regimen      Asthma    Breathing under good control with no recent exacerbations. Continue current regimen        Other   Hyperlipidemia - Primary    Recheck lipids, adjust as needed. Continue current regimen and good lifestyle modifications      Relevant Orders   Comprehensive metabolic panel (Completed)   Lipid Panel w/o Chol/HDL Ratio (Completed)   Insomnia    Significant improvement on hydroxyzine at bedtime. Continue current regimen      Major depressive disorder, single episode    Moods under good control with current regimen, continue present medications          Follow up plan: Return for CPE after 2/3.

## 2018-08-02 ENCOUNTER — Encounter: Payer: Self-pay | Admitting: Family Medicine

## 2018-08-02 LAB — COMPREHENSIVE METABOLIC PANEL
ALT: 22 IU/L (ref 0–32)
AST: 21 IU/L (ref 0–40)
Albumin/Globulin Ratio: 2 (ref 1.2–2.2)
Albumin: 4.3 g/dL (ref 3.8–4.8)
Alkaline Phosphatase: 75 IU/L (ref 39–117)
BUN/Creatinine Ratio: 11 — ABNORMAL LOW (ref 12–28)
BUN: 11 mg/dL (ref 8–27)
Bilirubin Total: 0.7 mg/dL (ref 0.0–1.2)
CO2: 23 mmol/L (ref 20–29)
Calcium: 9.1 mg/dL (ref 8.7–10.3)
Chloride: 104 mmol/L (ref 96–106)
Creatinine, Ser: 0.98 mg/dL (ref 0.57–1.00)
GFR calc Af Amer: 71 mL/min/{1.73_m2} (ref >59)
GFR calc non Af Amer: 62 mL/min/{1.73_m2} (ref >59)
Globulin, Total: 2.2 g/dL (ref 1.5–4.5)
Glucose: 107 mg/dL — ABNORMAL HIGH (ref 65–99)
Potassium: 4.2 mmol/L (ref 3.5–5.2)
Sodium: 142 mmol/L (ref 134–144)
Total Protein: 6.5 g/dL (ref 6.0–8.5)

## 2018-08-02 LAB — LIPID PANEL W/O CHOL/HDL RATIO
Cholesterol, Total: 167 mg/dL (ref 100–199)
HDL: 46 mg/dL (ref >39)
LDL Calculated: 90 mg/dL (ref 0–99)
Triglycerides: 155 mg/dL — ABNORMAL HIGH (ref 0–149)
VLDL Cholesterol Cal: 31 mg/dL (ref 5–40)

## 2018-08-04 DIAGNOSIS — G4733 Obstructive sleep apnea (adult) (pediatric): Secondary | ICD-10-CM | POA: Diagnosis not present

## 2018-08-05 NOTE — Assessment & Plan Note (Signed)
Moods under good control with current regimen, continue present medications

## 2018-08-05 NOTE — Assessment & Plan Note (Signed)
Breathing under good control with no recent exacerbations. Continue current regimen

## 2018-08-05 NOTE — Assessment & Plan Note (Signed)
Stable and under good control, followed by Feeling Great. Continue current regimen

## 2018-08-05 NOTE — Assessment & Plan Note (Signed)
Stable and WNL, continue current regimen 

## 2018-08-05 NOTE — Assessment & Plan Note (Signed)
Recheck lipids, adjust as needed. Continue current regimen and good lifestyle modifications 

## 2018-08-05 NOTE — Assessment & Plan Note (Signed)
Significant improvement on hydroxyzine at bedtime. Continue current regimen

## 2018-09-04 DIAGNOSIS — G4733 Obstructive sleep apnea (adult) (pediatric): Secondary | ICD-10-CM | POA: Diagnosis not present

## 2018-10-03 DIAGNOSIS — G4733 Obstructive sleep apnea (adult) (pediatric): Secondary | ICD-10-CM | POA: Diagnosis not present

## 2018-10-05 DIAGNOSIS — G4733 Obstructive sleep apnea (adult) (pediatric): Secondary | ICD-10-CM | POA: Diagnosis not present

## 2018-10-17 ENCOUNTER — Other Ambulatory Visit: Payer: Self-pay | Admitting: Unknown Physician Specialty

## 2018-10-31 ENCOUNTER — Ambulatory Visit
Admission: RE | Admit: 2018-10-31 | Discharge: 2018-10-31 | Disposition: A | Discharge status: Home / Self Care | Payer: Medicare Other | Source: Ambulatory Visit | Attending: Family Medicine | Admitting: Family Medicine

## 2018-10-31 DIAGNOSIS — Z1239 Encounter for other screening for malignant neoplasm of breast: Secondary | ICD-10-CM | POA: Diagnosis present

## 2018-10-31 DIAGNOSIS — Z1231 Encounter for screening mammogram for malignant neoplasm of breast: Secondary | ICD-10-CM | POA: Diagnosis not present

## 2018-11-04 DIAGNOSIS — G4733 Obstructive sleep apnea (adult) (pediatric): Secondary | ICD-10-CM | POA: Diagnosis not present

## 2018-11-12 DIAGNOSIS — G4733 Obstructive sleep apnea (adult) (pediatric): Secondary | ICD-10-CM | POA: Diagnosis not present

## 2018-11-17 ENCOUNTER — Other Ambulatory Visit: Payer: Self-pay | Admitting: Family Medicine

## 2018-11-17 NOTE — Telephone Encounter (Signed)
Requested medication (s) are due for refill today:  yes  Requested medication (s) are on the active medication list:  yes  Future visit scheduled:  No  Last Refill: 05/21/18; #90; no refill  Requested Prescriptions  Pending Prescriptions Disp Refills   QUEtiapine Fumarate (SEROQUEL XR) 150 MG 24 hr tablet [Pharmacy Med Name: QUETIAPINE ER 150 MG TABLET] 90 tablet 0    Sig: Take 1 tablet (150 mg total) by mouth at bedtime.     Not Delegated - Psychiatry:  Antipsychotics - Second Generation (Atypical) - quetiapine Failed - 11/17/2018  1:29 PM      Failed - This refill cannot be delegated      Passed - ALT in normal range and within 180 days    ALT  Date Value Ref Range Status  08/01/2018 22 0 - 32 IU/L Final         Passed - AST in normal range and within 180 days    AST  Date Value Ref Range Status  08/01/2018 21 0 - 40 IU/L Final         Passed - Last BP in normal range    BP Readings from Last 1 Encounters:  08/01/18 124/77         Passed - Valid encounter within last 6 months    Recent Outpatient Visits          3 months ago Hyperlipidemia, unspecified hyperlipidemia type   [facility] Volney American, [place]   4 months ago Primary insomnia   [facility] Volney American, [place]   5 months ago Primary insomnia   [place], [place], [place]   6 months ago Chronic ankle pain (Primary Area of Pain) (Left)   [facility] Volney American, PA-C   6 months ago Allergic contact dermatitis due to plants, except food   [place], [place], [place]             Passed - Completed PHQ-2 or PHQ-9 in the last 360 days.

## 2018-11-21 ENCOUNTER — Ambulatory Visit (INDEPENDENT_AMBULATORY_CARE_PROVIDER_SITE_OTHER): Payer: Medicare Other

## 2018-11-21 ENCOUNTER — Other Ambulatory Visit: Payer: Self-pay

## 2018-11-21 ENCOUNTER — Telehealth: Payer: Self-pay | Admitting: Family Medicine

## 2018-11-21 DIAGNOSIS — Z23 Encounter for immunization: Secondary | ICD-10-CM | POA: Diagnosis not present

## 2018-11-21 NOTE — Telephone Encounter (Signed)
Pt presented in office wanting to know about a bone density test stating her  insurance needed it.

## 2018-11-23 NOTE — Telephone Encounter (Signed)
Patient states that they said she needs one, the people from [facility] that does home visits. Patient states that she does not think she needs one and denies any broken bones.

## 2018-11-23 NOTE — Telephone Encounter (Signed)
Typically those are done at age 63 or if there are bone fractures - is her insurance specifically asking for it to be done at this time?

## 2018-11-23 NOTE — Telephone Encounter (Signed)
If she's comfortable doing so this can wait until age 63 when indicated

## 2018-12-05 DIAGNOSIS — M129 Arthropathy, unspecified: Secondary | ICD-10-CM | POA: Diagnosis not present

## 2018-12-05 DIAGNOSIS — E785 Hyperlipidemia, unspecified: Secondary | ICD-10-CM | POA: Diagnosis not present

## 2018-12-05 DIAGNOSIS — E559 Vitamin D deficiency, unspecified: Secondary | ICD-10-CM | POA: Diagnosis not present

## 2018-12-05 DIAGNOSIS — M62830 Muscle spasm of back: Secondary | ICD-10-CM | POA: Diagnosis not present

## 2018-12-05 DIAGNOSIS — Z03818 Encounter for observation for suspected exposure to other biological agents ruled out: Secondary | ICD-10-CM | POA: Diagnosis not present

## 2018-12-05 DIAGNOSIS — Z79899 Other long term (current) drug therapy: Secondary | ICD-10-CM | POA: Diagnosis not present

## 2018-12-05 DIAGNOSIS — M545 Low back pain: Secondary | ICD-10-CM | POA: Diagnosis not present

## 2018-12-05 DIAGNOSIS — M25572 Pain in left ankle and joints of left foot: Secondary | ICD-10-CM | POA: Diagnosis not present

## 2018-12-05 DIAGNOSIS — G4733 Obstructive sleep apnea (adult) (pediatric): Secondary | ICD-10-CM | POA: Diagnosis not present

## 2018-12-19 DIAGNOSIS — Z79899 Other long term (current) drug therapy: Secondary | ICD-10-CM | POA: Diagnosis not present

## 2018-12-19 DIAGNOSIS — M47817 Spondylosis without myelopathy or radiculopathy, lumbosacral region: Secondary | ICD-10-CM | POA: Diagnosis not present

## 2018-12-19 DIAGNOSIS — M25572 Pain in left ankle and joints of left foot: Secondary | ICD-10-CM | POA: Diagnosis not present

## 2018-12-19 DIAGNOSIS — M62830 Muscle spasm of back: Secondary | ICD-10-CM | POA: Diagnosis not present

## 2019-01-02 DIAGNOSIS — M62830 Muscle spasm of back: Secondary | ICD-10-CM | POA: Diagnosis not present

## 2019-01-02 DIAGNOSIS — M47817 Spondylosis without myelopathy or radiculopathy, lumbosacral region: Secondary | ICD-10-CM | POA: Diagnosis not present

## 2019-01-02 DIAGNOSIS — M25572 Pain in left ankle and joints of left foot: Secondary | ICD-10-CM | POA: Diagnosis not present

## 2019-01-02 DIAGNOSIS — Z79899 Other long term (current) drug therapy: Secondary | ICD-10-CM | POA: Diagnosis not present

## 2019-01-02 DIAGNOSIS — G4733 Obstructive sleep apnea (adult) (pediatric): Secondary | ICD-10-CM | POA: Diagnosis not present

## 2019-01-04 DIAGNOSIS — G4733 Obstructive sleep apnea (adult) (pediatric): Secondary | ICD-10-CM | POA: Diagnosis not present

## 2019-01-22 ENCOUNTER — Telehealth: Payer: Self-pay | Admitting: Family Medicine

## 2019-01-22 NOTE — Chronic Care Management (AMB) (Signed)
  Chronic Care Management   Outreach Note  01/22/2019 Name: Kayla Wright MRN: [id number] DOB: April 14, 1955  Referred by: Volney American, PA-C Reason for referral : Chronic Care Management (Initial CCM outreach was unsuccessful. )   An unsuccessful telephone outreach was attempted today. The patient was referred to the case management team by for assistance with care management and care coordination.   Follow Up Plan: A HIPPA compliant phone message was left for the patient providing contact information and requesting a return call.  The care management team will reach out to the patient again over the next 7 days.  If patient returns call to provider office, please advise to call [place] at [place], [place] Management  [place], [place] [address] Direct Dial: [email or web address]  Website: [email or web address]

## 2019-01-28 ENCOUNTER — Telehealth: Payer: Self-pay | Admitting: Family Medicine

## 2019-01-28 NOTE — Chronic Care Management (AMB) (Signed)
  Chronic Care Management   Outreach Note  01/28/2019 Name: Kayla Wright MRN: [id number] DOB: 08/01/1955  Referred by: Particia Nearing, PA-C Reason for referral : Chronic Care Management (Second CCM outreach was unsuccessful. )   A second unsuccessful telephone outreach was attempted today. The patient was referred to the case management team for assistance with care management and care coordination.   Follow Up Plan: A HIPPA compliant phone message was left for the patient providing contact information and requesting a return call.  The care management team will reach out to the patient again over the next 7 days.  If patient returns call to provider office, please advise to call Embedded Care Management Care Guide Randon Goldsmith at [phone]  [facility] Guide, Embedded Care Coordination [facility]  [place], [place] [address] Direct Dial: [phone] [email or web address]  [email or web address]: [email or web address]

## 2019-01-28 NOTE — Chronic Care Management (AMB) (Signed)
  Chronic Care Management   Outreach Note  01/28/2019 Name: Kayla Wright MRN: [id number] DOB: 1955-10-06  Referred by: Particia Nearing, PA-C Reason for referral : Chronic Care Management (Initial CCM outreach was unsuccessful. ) and Chronic Care Management (Second CCM outreach was unsuccessful. )   A second unsuccessful telephone outreach was attempted today. The patient was referred to the case management team for assistance with care management and care coordination.   Follow Up Plan: A HIPPA compliant phone message was left for the patient providing contact information and requesting a return call.  The care management team will reach out to the patient again over the next 7 days.  If patient returns call to provider office, please advise to call Embedded Care Management Care Guide Georgina Pillion at [phone]  [facility] Guide, Embedded Care Coordination [facility]  [place], [place] [address] Direct Dial: [phone] [email or web address]  Website: [email or web address]

## 2019-01-30 NOTE — Chronic Care Management (AMB) (Signed)
  Chronic Care Management   Note  01/30/2019 Name: Kayla Wright MRN: [id number] DOB: May 22, 1955  Kayla Wright is a 64 y.o. year old female who is a primary care patient of Volney American, [place]. I reached out to Myer Peer by phone today in response to a referral sent by [name]'s health plan.     [name] was given information about Chronic Care Management services today including:  1. CCM service includes personalized support from designated clinical staff supervised by her physician, including individualized plan of care and coordination with other care providers 2. 24/7 contact phone numbers for assistance for urgent and routine care needs. 3. Service will only be billed when office clinical staff spend 20 minutes or more in a month to coordinate care. 4. Only one practitioner may furnish and bill the service in a calendar month. 5. The patient may stop CCM services at any time (effective at the end of the month) by phone call to the office staff. 6. The patient will be responsible for cost sharing (co-pay) of up to 20% of the service fee (after annual deductible is met).  Patient agreed to services and verbal consent obtained.   Follow up plan: Telephone appointment with care management team member scheduled for: 03/12/2019  [place], [place] Management  [place], [place] [address] Direct Dial: [email or web address]  Website: [email or web address]

## 2019-02-01 DIAGNOSIS — Z79899 Other long term (current) drug therapy: Secondary | ICD-10-CM | POA: Diagnosis not present

## 2019-02-01 DIAGNOSIS — M47817 Spondylosis without myelopathy or radiculopathy, lumbosacral region: Secondary | ICD-10-CM | POA: Diagnosis not present

## 2019-02-01 DIAGNOSIS — M62830 Muscle spasm of back: Secondary | ICD-10-CM | POA: Diagnosis not present

## 2019-02-01 DIAGNOSIS — M25572 Pain in left ankle and joints of left foot: Secondary | ICD-10-CM | POA: Diagnosis not present

## 2019-02-04 DIAGNOSIS — G4733 Obstructive sleep apnea (adult) (pediatric): Secondary | ICD-10-CM | POA: Diagnosis not present

## 2019-02-08 ENCOUNTER — Other Ambulatory Visit: Payer: Self-pay | Admitting: Family Medicine

## 2019-02-08 NOTE — Telephone Encounter (Signed)
Requested medication (s) are due for refill today: yes  Requested medication (s) are on the active medication list: yes / Last refill:07/02/18  Future visit scheduled: yes  Notes to clinic:  hypothyroid agents failed  Requested Prescriptions  Pending Prescriptions Disp Refills   levothyroxine (SYNTHROID) 50 MCG tablet [Pharmacy Med Name: LEVOTHYROXINE 50 MCG TABLET] 90 tablet 0    Sig: TAKE 1 TABLET BY MOUTH ONCE DAILY BEFORE BREAKFAST      Endocrinology:  Hypothyroid Agents Failed - 02/08/2019  5:52 PM      Failed - TSH needs to be rechecked within 3 months after an abnormal result. Refill until TSH is due.      Passed - TSH in normal range and within 360 days    TSH  Date Value Ref Range Status  02/26/2018 2.650 0.450 - 4.500 uIU/mL Final          Passed - Valid encounter within last 12 months    Recent Outpatient Visits           6 months ago Hyperlipidemia, unspecified hyperlipidemia type   [facility] Particia Nearing, [place]   7 months ago Primary insomnia   [facility] Roosvelt Maser [place], [place]   8 months ago Primary insomnia   [facility] Roosvelt Maser [place], [place]   8 months ago Chronic ankle pain (Primary Area of Pain) (Left)   [facility] Roosvelt Maser [place], [place]   9 months ago Allergic contact dermatitis due to plants, except food   [facility] Roosvelt Maser [place], [place]               Signed Prescriptions Disp Refills   hydrOXYzine (ATARAX/VISTARIL) 25 MG tablet 90 tablet 0    Sig: Take 1 tablet (25 mg total) by mouth at bedtime as needed.      Ear, Nose, and Throat:  Antihistamines Passed - 02/08/2019  5:52 PM      Passed - Valid encounter within last 12 months    Recent Outpatient Visits           6 months ago Hyperlipidemia, unspecified hyperlipidemia type   [facility] Particia Nearing, [place]   7 months ago Primary insomnia   [facility] Roosvelt Maser [place], [place]   8 months ago Primary insomnia   [facility] Roosvelt Maser [place], [place]   8 months ago Chronic ankle pain (Primary Area of Pain) (Left)   [facility] Particia Nearing, [place]   9 months ago Allergic contact dermatitis due to plants, except food   [facility] Particia Nearing, [place]

## 2019-02-11 ENCOUNTER — Other Ambulatory Visit: Payer: Self-pay

## 2019-02-11 NOTE — Telephone Encounter (Signed)
Error-Disregard Duplicate Request

## 2019-02-11 NOTE — Telephone Encounter (Signed)
Patient last seen 08/01/18 

## 2019-02-13 DIAGNOSIS — G4733 Obstructive sleep apnea (adult) (pediatric): Secondary | ICD-10-CM | POA: Diagnosis not present

## 2019-02-13 DIAGNOSIS — E7800 Pure hypercholesterolemia, unspecified: Secondary | ICD-10-CM | POA: Diagnosis not present

## 2019-02-13 DIAGNOSIS — I10 Essential (primary) hypertension: Secondary | ICD-10-CM | POA: Diagnosis not present

## 2019-02-13 DIAGNOSIS — R0602 Shortness of breath: Secondary | ICD-10-CM | POA: Diagnosis not present

## 2019-03-04 ENCOUNTER — Other Ambulatory Visit: Payer: Self-pay | Admitting: Family Medicine

## 2019-03-04 DIAGNOSIS — M25572 Pain in left ankle and joints of left foot: Secondary | ICD-10-CM | POA: Diagnosis not present

## 2019-03-04 DIAGNOSIS — M62830 Muscle spasm of back: Secondary | ICD-10-CM | POA: Diagnosis not present

## 2019-03-04 DIAGNOSIS — M47817 Spondylosis without myelopathy or radiculopathy, lumbosacral region: Secondary | ICD-10-CM | POA: Diagnosis not present

## 2019-03-07 DIAGNOSIS — G4733 Obstructive sleep apnea (adult) (pediatric): Secondary | ICD-10-CM | POA: Diagnosis not present

## 2019-03-12 ENCOUNTER — Ambulatory Visit: Payer: Self-pay | Admitting: General Practice

## 2019-03-12 ENCOUNTER — Telehealth: Payer: Medicare Other

## 2019-03-12 NOTE — Chronic Care Management (AMB) (Signed)
  Chronic Care Management   Outreach Note  03/12/2019 Name: Kayla Wright MRN: [id number] DOB: 12/27/1955  Referred by: Particia Nearing, PA-C Reason for referral : Chronic Care Management (Initial outreach: Chronic Disease Management and Care coordination Needs)   An unsuccessful telephone outreach was attempted today. The patient was referred to the case management team for assistance with care management and care coordination.   Follow Up Plan: A HIPPA compliant phone message was left for the patient providing contact information and requesting a return call.   Alto Denver RN, MSN, CCM Community Care Coordinator [place]  [facility] Mobile: [phone]

## 2019-04-01 DIAGNOSIS — Z79899 Other long term (current) drug therapy: Secondary | ICD-10-CM | POA: Diagnosis not present

## 2019-04-01 DIAGNOSIS — M25572 Pain in left ankle and joints of left foot: Secondary | ICD-10-CM | POA: Diagnosis not present

## 2019-04-01 DIAGNOSIS — M47817 Spondylosis without myelopathy or radiculopathy, lumbosacral region: Secondary | ICD-10-CM | POA: Diagnosis not present

## 2019-04-01 DIAGNOSIS — M62830 Muscle spasm of back: Secondary | ICD-10-CM | POA: Diagnosis not present

## 2019-04-04 DIAGNOSIS — G4733 Obstructive sleep apnea (adult) (pediatric): Secondary | ICD-10-CM | POA: Diagnosis not present

## 2019-04-30 ENCOUNTER — Telehealth: Payer: Medicare Other

## 2019-04-30 ENCOUNTER — Ambulatory Visit: Payer: Self-pay | Admitting: General Practice

## 2019-04-30 DIAGNOSIS — Z79899 Other long term (current) drug therapy: Secondary | ICD-10-CM | POA: Diagnosis not present

## 2019-04-30 DIAGNOSIS — M62830 Muscle spasm of back: Secondary | ICD-10-CM | POA: Diagnosis not present

## 2019-04-30 DIAGNOSIS — Z1159 Encounter for screening for other viral diseases: Secondary | ICD-10-CM | POA: Diagnosis not present

## 2019-04-30 DIAGNOSIS — M129 Arthropathy, unspecified: Secondary | ICD-10-CM | POA: Diagnosis not present

## 2019-04-30 DIAGNOSIS — M25572 Pain in left ankle and joints of left foot: Secondary | ICD-10-CM | POA: Diagnosis not present

## 2019-04-30 DIAGNOSIS — E559 Vitamin D deficiency, unspecified: Secondary | ICD-10-CM | POA: Diagnosis not present

## 2019-04-30 DIAGNOSIS — M47817 Spondylosis without myelopathy or radiculopathy, lumbosacral region: Secondary | ICD-10-CM | POA: Diagnosis not present

## 2019-04-30 NOTE — Chronic Care Management (AMB) (Signed)
°  Chronic Care Management   Outreach Note  04/30/2019 Name: Kayla Wright MRN: [id number] DOB: [birth year]  Referred by: Particia Nearing, PA-C Reason for referral : Chronic Care Management (initial: 2nd attempt)   A second unsuccessful telephone outreach was attempted today. The patient was referred to the case management team for assistance with care management and care coordination.   Follow Up Plan: A HIPPA compliant phone message was left for the patient providing contact information and requesting a return call.   Alto Denver RN, MSN, CCM Community Care Coordinator [place]   [facility] Mobile: [phone]

## 2019-05-03 ENCOUNTER — Other Ambulatory Visit: Payer: Self-pay | Admitting: Family Medicine

## 2019-05-03 ENCOUNTER — Other Ambulatory Visit: Payer: Self-pay | Admitting: Unknown Physician Specialty

## 2019-05-03 NOTE — Telephone Encounter (Signed)
Requested Prescriptions  Pending Prescriptions Disp Refills  . simvastatin (ZOCOR) 40 MG tablet [Pharmacy Med Name: Simvastatin 40 MG Oral Tablet] 90 tablet 0    Sig: Take 1 tablet by mouth once daily     Cardiovascular:  Antilipid - Statins Failed - 05/03/2019 11:17 AM      Failed - Triglycerides in normal range and within 360 days    Triglycerides  Date Value Ref Range Status  08/01/2018 155 (H) 0 - 149 mg/dL Final         Passed - Total Cholesterol in normal range and within 360 days    Cholesterol, Total  Date Value Ref Range Status  08/01/2018 167 100 - 199 mg/dL Final         Passed - LDL in normal range and within 360 days    LDL Calculated  Date Value Ref Range Status  08/01/2018 90 0 - 99 mg/dL Final         Passed - HDL in normal range and within 360 days    HDL  Date Value Ref Range Status  08/01/2018 46 >39 mg/dL Final         Passed - Patient is not pregnant      Passed - Valid encounter within last 12 months    Recent Outpatient Visits          9 months ago Hyperlipidemia, unspecified hyperlipidemia type   [facility] Particia Nearing, [place]   10 months ago Primary insomnia   [facility] Particia Nearing, [place]   11 months ago Primary insomnia   [facility] Roosvelt Maser [place], [place]   11 months ago Chronic ankle pain (Primary Area of Pain) (Left)   [facility] Particia Nearing, [place]   1 year ago Allergic contact dermatitis due to plants, except food   [facility], [place], [place]

## 2019-05-03 NOTE — Telephone Encounter (Signed)
LOV 08/01/18

## 2019-05-05 DIAGNOSIS — G4733 Obstructive sleep apnea (adult) (pediatric): Secondary | ICD-10-CM | POA: Diagnosis not present

## 2019-05-13 DIAGNOSIS — G4733 Obstructive sleep apnea (adult) (pediatric): Secondary | ICD-10-CM | POA: Diagnosis not present

## 2019-05-30 DIAGNOSIS — M47817 Spondylosis without myelopathy or radiculopathy, lumbosacral region: Secondary | ICD-10-CM | POA: Diagnosis not present

## 2019-05-30 DIAGNOSIS — M62830 Muscle spasm of back: Secondary | ICD-10-CM | POA: Diagnosis not present

## 2019-05-30 DIAGNOSIS — Z79899 Other long term (current) drug therapy: Secondary | ICD-10-CM | POA: Diagnosis not present

## 2019-05-30 DIAGNOSIS — M25572 Pain in left ankle and joints of left foot: Secondary | ICD-10-CM | POA: Diagnosis not present

## 2019-06-04 DIAGNOSIS — G4733 Obstructive sleep apnea (adult) (pediatric): Secondary | ICD-10-CM | POA: Diagnosis not present

## 2019-06-07 ENCOUNTER — Ambulatory Visit: Payer: Self-pay | Admitting: General Practice

## 2019-06-07 ENCOUNTER — Telehealth: Payer: Medicare Other

## 2019-06-07 NOTE — Chronic Care Management (AMB) (Signed)
°  Chronic Care Management   Outreach Note  06/07/2019 Name: Kayla Wright MRN: [id number] DOB: [birth year]  Referred by: Particia Nearing, PA-C Reason for referral : Chronic Care Management (Initial outreach: 3rd attempt: RNCM Chronic disease Management and care coordination needs)   Third unsuccessful telephone outreach was attempted today. The patient was referred to the case management team for assistance with care management and care coordination. The patient's primary care provider has been notified of our unsuccessful attempts to make or maintain contact with the patient. The care management team is pleased to engage with this patient at any time in the future should he/she be interested in assistance from the care management team.   Follow Up Plan: The care management team is available to follow up with the patient after provider conversation with the patient regarding recommendation for care management engagement and subsequent re-referral to the care management team.   Alto Denver RN, MSN, CCM Community Care Coordinator [place]   [facility] Mobile: [phone]

## 2019-06-28 DIAGNOSIS — M47817 Spondylosis without myelopathy or radiculopathy, lumbosacral region: Secondary | ICD-10-CM | POA: Diagnosis not present

## 2019-06-28 DIAGNOSIS — M62830 Muscle spasm of back: Secondary | ICD-10-CM | POA: Diagnosis not present

## 2019-06-28 DIAGNOSIS — M25572 Pain in left ankle and joints of left foot: Secondary | ICD-10-CM | POA: Diagnosis not present

## 2019-06-28 DIAGNOSIS — Z79899 Other long term (current) drug therapy: Secondary | ICD-10-CM | POA: Diagnosis not present

## 2019-07-15 ENCOUNTER — Other Ambulatory Visit: Payer: Self-pay

## 2019-07-15 ENCOUNTER — Encounter: Payer: Self-pay | Admitting: Family Medicine

## 2019-07-15 ENCOUNTER — Ambulatory Visit (INDEPENDENT_AMBULATORY_CARE_PROVIDER_SITE_OTHER): Payer: Medicare Other | Admitting: Family Medicine

## 2019-07-15 VITALS — BP 117/66 | HR 72 | Temp 98.4°F | Wt 218.0 lb

## 2019-07-15 DIAGNOSIS — I10 Essential (primary) hypertension: Secondary | ICD-10-CM | POA: Diagnosis not present

## 2019-07-15 DIAGNOSIS — G4730 Sleep apnea, unspecified: Secondary | ICD-10-CM

## 2019-07-15 DIAGNOSIS — F419 Anxiety disorder, unspecified: Secondary | ICD-10-CM

## 2019-07-15 DIAGNOSIS — F5101 Primary insomnia: Secondary | ICD-10-CM

## 2019-07-15 DIAGNOSIS — R7309 Other abnormal glucose: Secondary | ICD-10-CM | POA: Diagnosis not present

## 2019-07-15 DIAGNOSIS — E785 Hyperlipidemia, unspecified: Secondary | ICD-10-CM | POA: Diagnosis not present

## 2019-07-15 DIAGNOSIS — F322 Major depressive disorder, single episode, severe without psychotic features: Secondary | ICD-10-CM

## 2019-07-15 DIAGNOSIS — E038 Other specified hypothyroidism: Secondary | ICD-10-CM | POA: Diagnosis not present

## 2019-07-15 MED ORDER — UMECLIDINIUM-VILANTEROL 62.5-25 MCG/INH IN AEPB: 1.0000 | INHALATION_SPRAY | Freq: Every day | RESPIRATORY_TRACT | 0 refills | Status: DC

## 2019-07-15 MED ORDER — LEVOTHYROXINE SODIUM 50 MCG PO TABS: 50.0000 ug | ORAL_TABLET | Freq: Every day | ORAL | 1 refills | Status: DC

## 2019-07-15 MED ORDER — SIMVASTATIN 40 MG PO TABS: 40.0000 mg | ORAL_TABLET | Freq: Every day | ORAL | 1 refills | Status: DC

## 2019-07-15 MED ORDER — CLONIDINE HCL 0.2 MG PO TABS: 0.2000 mg | ORAL_TABLET | Freq: Three times a day (TID) | ORAL | 1 refills | Status: DC

## 2019-07-15 MED ORDER — ALBUTEROL SULFATE HFA 108 (90 BASE) MCG/ACT IN AERS: 2.0000 | INHALATION_SPRAY | Freq: Four times a day (QID) | RESPIRATORY_TRACT | 1 refills | Status: DC | PRN

## 2019-07-15 MED ORDER — FLUOXETINE HCL 20 MG PO CAPS: 20.0000 mg | ORAL_CAPSULE | Freq: Every day | ORAL | 1 refills | Status: DC

## 2019-07-15 MED ORDER — HYDROXYZINE HCL 25 MG PO TABS: 25.0000 mg | ORAL_TABLET | Freq: Every evening | ORAL | 1 refills | Status: DC | PRN

## 2019-07-15 MED ORDER — FLUOXETINE HCL 40 MG PO CAPS: 40.0000 mg | ORAL_CAPSULE | Freq: Every day | ORAL | 1 refills | Status: DC

## 2019-07-15 MED ORDER — QUETIAPINE FUMARATE ER 150 MG PO TB24: 150.0000 mg | ORAL_TABLET | Freq: Every day | ORAL | 1 refills | Status: DC

## 2019-07-15 NOTE — Progress Notes (Signed)
BP 117/66   Pulse 72   Temp 98.4 F (36.9 C) (Oral)   Wt 218 lb (98.9 kg)   SpO2 96%   BMI 33.15 kg/m    Subjective:    Patient ID: Kayla Wright, female    DOB: 11/16/1955, 64 y.o.   MRN: [id number]  HPI: Kayla Wright is a 64 y.o. female  Chief Complaint  Patient presents with  . Anxiety  . Depression   Here today to discuss some anxiety issues exacerbated by some personal issues about a week ago. Prior to that things were pretty stable on current regimen. Having panic attacks, crying spells, depressed moods in this time. Currently on seroquel and prozac regimen, taking faithfully without side effects. Denies SI/HI.   Also overdue for 6 month f/u chronic conditions.   HTN - Home BPs 120s/70s range consistently. Taking medicines faithfully without side effects. Denies CP, SOB, HAs, dizziness.   Sleep apnea - On CPAP, overall sleeping well aside from the past week with her anxiety exacerbated.   Hypothyroidism - Tolerating synthroid well, no new concerns.   HLD - on simvastatin without myalgias or other side effects. Denies claudication, CP, SOB. Does not follow a strict diet or exercise regularly.   Depression screen [facility] 2/9 08/01/2018 02/26/2018 09/21/2017  Decreased Interest 1 2 0  Down, Depressed, Hopeless 2 0 0  PHQ - 2 Score 3 2 0  Altered sleeping 2 3 3   Tired, decreased energy 2 3 2   Change in appetite 3 0 1  Feeling bad or failure about yourself  1 1 1   Trouble concentrating 2 0 1  Moving slowly or fidgety/restless 0 0 1  Suicidal thoughts 0 0 0  PHQ-9 Score 13 9 9   Difficult doing work/chores - Not difficult at all -  Some recent data might be hidden   GAD 7 : Generalized Anxiety Score 09/21/2017 08/25/2017 04/13/2015  Nervous, Anxious, on Edge 2 1 3   Control/stop worrying 1 0 2  Worry too much - different things 2 2 3   Trouble relaxing 2 2 3   Restless 1 2 3   Easily annoyed or irritable 1 3 2   Afraid - awful might happen 1 2 2   Total GAD 7 Score 10 12 18     Anxiety Difficulty - - Very difficult  Some encounter information is confidential and restricted. Go to Review Flowsheets activity to see all data.     Relevant past medical, surgical, family and social history reviewed and updated as indicated. Interim medical history since our last visit reviewed. Allergies and medications reviewed and updated.  Review of Systems  Per HPI unless specifically indicated above     Objective:    BP 117/66   Pulse 72   Temp 98.4 F (36.9 C) (Oral)   Wt 218 lb (98.9 kg)   SpO2 96%   BMI 33.15 kg/m   Wt Readings from Last 3 Encounters:  07/15/19 218 lb (98.9 kg)  08/01/18 217 lb 4 oz (98.5 kg)  02/26/18 213 lb (96.6 kg)    Physical Exam Vitals and nursing note reviewed.  Constitutional:      Appearance: Normal appearance. She is not ill-appearing.  HENT:     Head: Atraumatic.  Eyes:     Extraocular Movements: Extraocular movements intact.     Conjunctiva/sclera: Conjunctivae normal.  Cardiovascular:     Rate and Rhythm: Normal rate and regular rhythm.     Heart sounds: Normal heart sounds.  Pulmonary:  Effort: Pulmonary effort is normal.     Breath sounds: Normal breath sounds.  Musculoskeletal:        General: Normal range of motion.     Cervical back: Normal range of motion and neck supple.  Skin:    General: Skin is warm and dry.  Neurological:     Mental Status: She is alert and oriented to person, place, and time.  Psychiatric:        Thought Content: Thought content normal.        Judgment: Judgment normal.     Comments: tearful     Results for orders placed or performed in visit on 07/15/19  Comprehensive metabolic panel  Result Value Ref Range   Glucose 86 65 - 99 mg/dL   BUN 11 8 - 27 mg/dL   Creatinine, Ser 5.02 (H) 0.57 - 1.00 mg/dL   GFR calc non Af Amer 51 (L) >59 mL/min/1.73   GFR calc Af Amer 59 (L) >59 mL/min/1.73   BUN/Creatinine Ratio 10 (L) 12 - 28   Sodium 139 134 - 144 mmol/L   Potassium 4.3 3.5  - 5.2 mmol/L   Chloride 102 96 - 106 mmol/L   CO2 23 20 - 29 mmol/L   Calcium 9.2 8.7 - 10.3 mg/dL   Total Protein 7.2 6.0 - 8.5 g/dL   Albumin 4.6 3.8 - 4.8 g/dL   Globulin, Total 2.6 1.5 - 4.5 g/dL   Albumin/Globulin Ratio 1.8 1.2 - 2.2   Bilirubin Total 0.9 0.0 - 1.2 mg/dL   Alkaline Phosphatase 97 48 - 121 IU/L   AST 24 0 - 40 IU/L   ALT 23 0 - 32 IU/L  Lipid Panel w/o Chol/HDL Ratio  Result Value Ref Range   Cholesterol, Total 165 100 - 199 mg/dL   Triglycerides 774 (H) 0 - 149 mg/dL   HDL 44 >12 mg/dL   VLDL Cholesterol Cal 28 5 - 40 mg/dL   LDL Chol Calc (NIH) 93 0 - 99 mg/dL  TSH  Result Value Ref Range   TSH 2.590 0.450 - 4.500 uIU/mL  HgB A1c  Result Value Ref Range   Hgb A1c MFr Bld 5.9 (H) 4.8 - 5.6 %   Est. average glucose Bld gHb Est-mCnc 123 mg/dL      Assessment & Plan:   Problem List Items Addressed This Visit      Cardiovascular and Mediastinum   Essential hypertension    Stable and well controlled, continue present medications      Relevant Medications   simvastatin (ZOCOR) 40 MG tablet   cloNIDine (CATAPRES) 0.2 MG tablet   Other Relevant Orders   Comprehensive metabolic panel (Completed)     Respiratory   Sleep apnea    Stable on CPAP. COntinue current regimen        Endocrine   Hypothyroidism    Recheck TSH, adjust as needed. Continue current regimen      Relevant Medications   levothyroxine (SYNTHROID) 50 MCG tablet   Other Relevant Orders   TSH (Completed)     Other   Chronic anxiety - Primary (Chronic)    Exacerbated, increase prozac and start prn hydroxyzine and continue remainder of regimen      Relevant Medications   FLUoxetine (PROZAC) 20 MG capsule   hydrOXYzine (ATARAX/VISTARIL) 25 MG tablet   FLUoxetine (PROZAC) 40 MG capsule   Hyperlipidemia    Recheck lipids, adjust as needed. Continue current regimen      Relevant Medications  simvastatin (ZOCOR) 40 MG tablet   cloNIDine (CATAPRES) 0.2 MG tablet   Other  Relevant Orders   Lipid Panel w/o Chol/HDL Ratio (Completed)   Insomnia    Stable and overall under good control. Continue current regimen      Major depressive disorder, single episode    Exacerbated by stressors. Increase prozac, continue seroquel and monitor for benefit. Recommended counseling, pt will consider      Relevant Medications   FLUoxetine (PROZAC) 20 MG capsule   hydrOXYzine (ATARAX/VISTARIL) 25 MG tablet   FLUoxetine (PROZAC) 40 MG capsule    Other Visit Diagnoses    Elevated glucose       Relevant Orders   HgB A1c (Completed)       Follow up plan: Return in about 4 weeks (around 08/12/2019) for anxiety f/u.

## 2019-07-15 NOTE — Patient Instructions (Signed)
Start taking the 20 mg prozac alongside the 40 mg prozac to total 60 mg daily Can try 1/2 tab to full tab hydroxyzine as needed up to 3 times per day for severe anxiety episodes

## 2019-07-16 LAB — COMPREHENSIVE METABOLIC PANEL
ALT: 23 IU/L (ref 0–32)
AST: 24 IU/L (ref 0–40)
Albumin/Globulin Ratio: 1.8 (ref 1.2–2.2)
Albumin: 4.6 g/dL (ref 3.8–4.8)
Alkaline Phosphatase: 97 IU/L (ref 48–121)
BUN/Creatinine Ratio: 10 — ABNORMAL LOW (ref 12–28)
BUN: 11 mg/dL (ref 8–27)
Bilirubin Total: 0.9 mg/dL (ref 0.0–1.2)
CO2: 23 mmol/L (ref 20–29)
Calcium: 9.2 mg/dL (ref 8.7–10.3)
Chloride: 102 mmol/L (ref 96–106)
Creatinine, Ser: 1.14 mg/dL — ABNORMAL HIGH (ref 0.57–1.00)
GFR calc Af Amer: 59 mL/min/{1.73_m2} — ABNORMAL LOW (ref >59)
GFR calc non Af Amer: 51 mL/min/{1.73_m2} — ABNORMAL LOW (ref >59)
Globulin, Total: 2.6 g/dL (ref 1.5–4.5)
Glucose: 86 mg/dL (ref 65–99)
Potassium: 4.3 mmol/L (ref 3.5–5.2)
Sodium: 139 mmol/L (ref 134–144)
Total Protein: 7.2 g/dL (ref 6.0–8.5)

## 2019-07-16 LAB — HEMOGLOBIN A1C
Est. average glucose Bld gHb Est-mCnc: 123 mg/dL
Hgb A1c MFr Bld: 5.9 % — ABNORMAL HIGH (ref 4.8–5.6)

## 2019-07-16 LAB — LIPID PANEL W/O CHOL/HDL RATIO
Cholesterol, Total: 165 mg/dL (ref 100–199)
HDL: 44 mg/dL (ref >39)
LDL Chol Calc (NIH): 93 mg/dL (ref 0–99)
Triglycerides: 163 mg/dL — ABNORMAL HIGH (ref 0–149)
VLDL Cholesterol Cal: 28 mg/dL (ref 5–40)

## 2019-07-16 LAB — TSH: TSH: 2.59 u[IU]/mL (ref 0.450–4.500)

## 2019-07-17 ENCOUNTER — Encounter: Payer: Self-pay | Admitting: Family Medicine

## 2019-07-17 DIAGNOSIS — R7301 Impaired fasting glucose: Secondary | ICD-10-CM | POA: Insufficient documentation

## 2019-07-22 NOTE — Assessment & Plan Note (Signed)
Stable and well controlled, continue present medications 

## 2019-07-22 NOTE — Assessment & Plan Note (Signed)
Recheck TSH, adjust as needed. Continue current regimen 

## 2019-07-22 NOTE — Assessment & Plan Note (Signed)
Stable and overall under good control. Continue current regimen. 

## 2019-07-22 NOTE — Assessment & Plan Note (Signed)
Stable on CPAP. COntinue current regimen

## 2019-07-22 NOTE — Assessment & Plan Note (Addendum)
Exacerbated, increase prozac and start prn hydroxyzine and continue remainder of regimen

## 2019-07-22 NOTE — Assessment & Plan Note (Signed)
Recheck lipids, adjust as needed. Continue current regimen 

## 2019-07-22 NOTE — Assessment & Plan Note (Signed)
Exacerbated by stressors. Increase prozac, continue seroquel and monitor for benefit. Recommended counseling, pt will consider

## 2019-07-26 DIAGNOSIS — M47817 Spondylosis without myelopathy or radiculopathy, lumbosacral region: Secondary | ICD-10-CM | POA: Diagnosis not present

## 2019-07-26 DIAGNOSIS — M62830 Muscle spasm of back: Secondary | ICD-10-CM | POA: Diagnosis not present

## 2019-07-26 DIAGNOSIS — Z79899 Other long term (current) drug therapy: Secondary | ICD-10-CM | POA: Diagnosis not present

## 2019-07-26 DIAGNOSIS — M25572 Pain in left ankle and joints of left foot: Secondary | ICD-10-CM | POA: Diagnosis not present

## 2019-08-12 ENCOUNTER — Encounter: Payer: Self-pay | Admitting: Family Medicine

## 2019-08-12 ENCOUNTER — Other Ambulatory Visit: Payer: Self-pay

## 2019-08-12 ENCOUNTER — Ambulatory Visit (INDEPENDENT_AMBULATORY_CARE_PROVIDER_SITE_OTHER): Payer: Medicare Other | Admitting: Family Medicine

## 2019-08-12 VITALS — BP 106/66 | HR 61 | Temp 98.7°F | Wt 218.0 lb

## 2019-08-12 DIAGNOSIS — F322 Major depressive disorder, single episode, severe without psychotic features: Secondary | ICD-10-CM | POA: Diagnosis not present

## 2019-08-12 DIAGNOSIS — G4733 Obstructive sleep apnea (adult) (pediatric): Secondary | ICD-10-CM | POA: Diagnosis not present

## 2019-08-12 DIAGNOSIS — F419 Anxiety disorder, unspecified: Secondary | ICD-10-CM

## 2019-08-12 NOTE — Assessment & Plan Note (Signed)
Improvement with increased prozac dose, continue this and seroquel regimen with close monitoring 

## 2019-08-12 NOTE — Assessment & Plan Note (Signed)
Improvement with increased prozac dose, continue this and seroquel regimen with close monitoring

## 2019-08-12 NOTE — Progress Notes (Signed)
BP 106/66   Pulse 61   Temp 98.7 F (37.1 C) (Oral)   Wt 218 lb (98.9 kg)   SpO2 96%   BMI 33.15 kg/m    Subjective:    Patient ID: Kayla Wright, female    DOB: [birth year], 64 y.o.   MRN: [id number]  HPI: Kayla Wright is a 63 y.o. female  Chief Complaint  Patient presents with  . Anxiety   Here today for 1 month f/u anxiety and depression after increasing prozac dose to 60 mg. Doing much better on this dose and notes her stressors are also improving. Also taking her seroquel per usual instructions. Denies SI/HI, side effects, anhedonia, frequent panic episodes.   Depression screen [facility] 2/9 08/12/2019 08/01/2018 02/26/2018  Decreased Interest 2 1 2   Down, Depressed, Hopeless 2 2 0  PHQ - 2 Score 4 3 2   Altered sleeping 1 2 3   Tired, decreased energy 2 2 3   Change in appetite 2 3 0  Feeling bad or failure about yourself  2 1 1   Trouble concentrating 1 2 0  Moving slowly or fidgety/restless 1 0 0  Suicidal thoughts 0 0 0  PHQ-9 Score 13 13 9   Difficult doing work/chores - - Not difficult at all  Some recent data might be hidden   GAD 7 : Generalized Anxiety Score 08/12/2019 09/21/2017 08/25/2017 04/13/2015  Nervous, Anxious, on Edge 2 2 1 3   Control/stop worrying 2 1 0 2  Worry too much - different things 3 2 2 3   Trouble relaxing 2 2 2 3   Restless 2 1 2 3   Easily annoyed or irritable 3 1 3 2   Afraid - awful might happen 2 1 2 2   Total GAD 7 Score 16 10 12 18   Anxiety Difficulty Somewhat difficult - - Very difficult  Some encounter information is confidential and restricted. Go to Review Flowsheets activity to see all data.   Relevant past medical, surgical, family and social history reviewed and updated as indicated. Interim medical history since our last visit reviewed. Allergies and medications reviewed and updated.  Review of Systems  Per HPI unless specifically indicated above     Objective:    BP 106/66   Pulse 61   Temp 98.7 F (37.1 C) (Oral)   Wt 218 lb  (98.9 kg)   SpO2 96%   BMI 33.15 kg/m   Wt Readings from Last 3 Encounters:  08/12/19 218 lb (98.9 kg)  07/15/19 218 lb (98.9 kg)  08/01/18 217 lb 4 oz (98.5 kg)    Physical Exam Vitals and nursing note reviewed.  Constitutional:      Appearance: Normal appearance. She is not ill-appearing.  HENT:     Head: Atraumatic.  Eyes:     Extraocular Movements: Extraocular movements intact.     Conjunctiva/sclera: Conjunctivae normal.  Cardiovascular:     Rate and Rhythm: Normal rate and regular rhythm.     Heart sounds: Normal heart sounds.  Pulmonary:     Effort: Pulmonary effort is normal.     Breath sounds: Normal breath sounds.  Musculoskeletal:        General: Normal range of motion.     Cervical back: Normal range of motion and neck supple.  Skin:    General: Skin is warm and dry.  Neurological:     Mental Status: She is alert and oriented to person, place, and time.  Psychiatric:        Mood and Affect: Mood  normal.        Thought Content: Thought content normal.        Judgment: Judgment normal.     Results for orders placed or performed in visit on 07/15/19  Comprehensive metabolic panel  Result Value Ref Range   Glucose 86 65 - 99 mg/dL   BUN 11 8 - 27 mg/dL   Creatinine, Ser 9.67 (H) 0.57 - 1.00 mg/dL   GFR calc non Af Amer 51 (L) >59 mL/min/1.73   GFR calc Af Amer 59 (L) >59 mL/min/1.73   BUN/Creatinine Ratio 10 (L) 12 - 28   Sodium 139 134 - 144 mmol/L   Potassium 4.3 3.5 - 5.2 mmol/L   Chloride 102 96 - 106 mmol/L   CO2 23 20 - 29 mmol/L   Calcium 9.2 8.7 - 10.3 mg/dL   Total Protein 7.2 6.0 - 8.5 g/dL   Albumin 4.6 3.8 - 4.8 g/dL   Globulin, Total 2.6 1.5 - 4.5 g/dL   Albumin/Globulin Ratio 1.8 1.2 - 2.2   Bilirubin Total 0.9 0.0 - 1.2 mg/dL   Alkaline Phosphatase 97 48 - 121 IU/L   AST 24 0 - 40 IU/L   ALT 23 0 - 32 IU/L  Lipid Panel w/o Chol/HDL Ratio  Result Value Ref Range   Cholesterol, Total 165 100 - 199 mg/dL   Triglycerides 893 (H) 0 -  149 mg/dL   HDL 44 >81 mg/dL   VLDL Cholesterol Cal 28 5 - 40 mg/dL   LDL Chol Calc (NIH) 93 0 - 99 mg/dL  TSH  Result Value Ref Range   TSH 2.590 0.450 - 4.500 uIU/mL  HgB A1c  Result Value Ref Range   Hgb A1c MFr Bld 5.9 (H) 4.8 - 5.6 %   Est. average glucose Bld gHb Est-mCnc 123 mg/dL      Assessment & Plan:   Problem List Items Addressed This Visit      Other   Chronic anxiety - Primary (Chronic)    Improvement with increased prozac dose, continue this and seroquel regimen with close monitoring      Severe depression (HCC)    Improvement with increased prozac dose, continue this and seroquel regimen with close monitoring          Follow up plan: Return in about 5 months (around 01/12/2020) for 6 month f/u.

## 2019-08-21 DIAGNOSIS — R0602 Shortness of breath: Secondary | ICD-10-CM | POA: Diagnosis not present

## 2019-08-21 DIAGNOSIS — R079 Chest pain, unspecified: Secondary | ICD-10-CM | POA: Diagnosis not present

## 2019-08-21 DIAGNOSIS — E7800 Pure hypercholesterolemia, unspecified: Secondary | ICD-10-CM | POA: Diagnosis not present

## 2019-08-22 IMAGING — CR DG CHEST 2V
2 series · 2 of 2 positions shown · non-contrast
Comparison: 03/07/2017.

CLINICAL DATA: Chest pain and shortness of breath for the past 2
days.

EXAM:
CHEST - 2 VIEW

[chest pa]
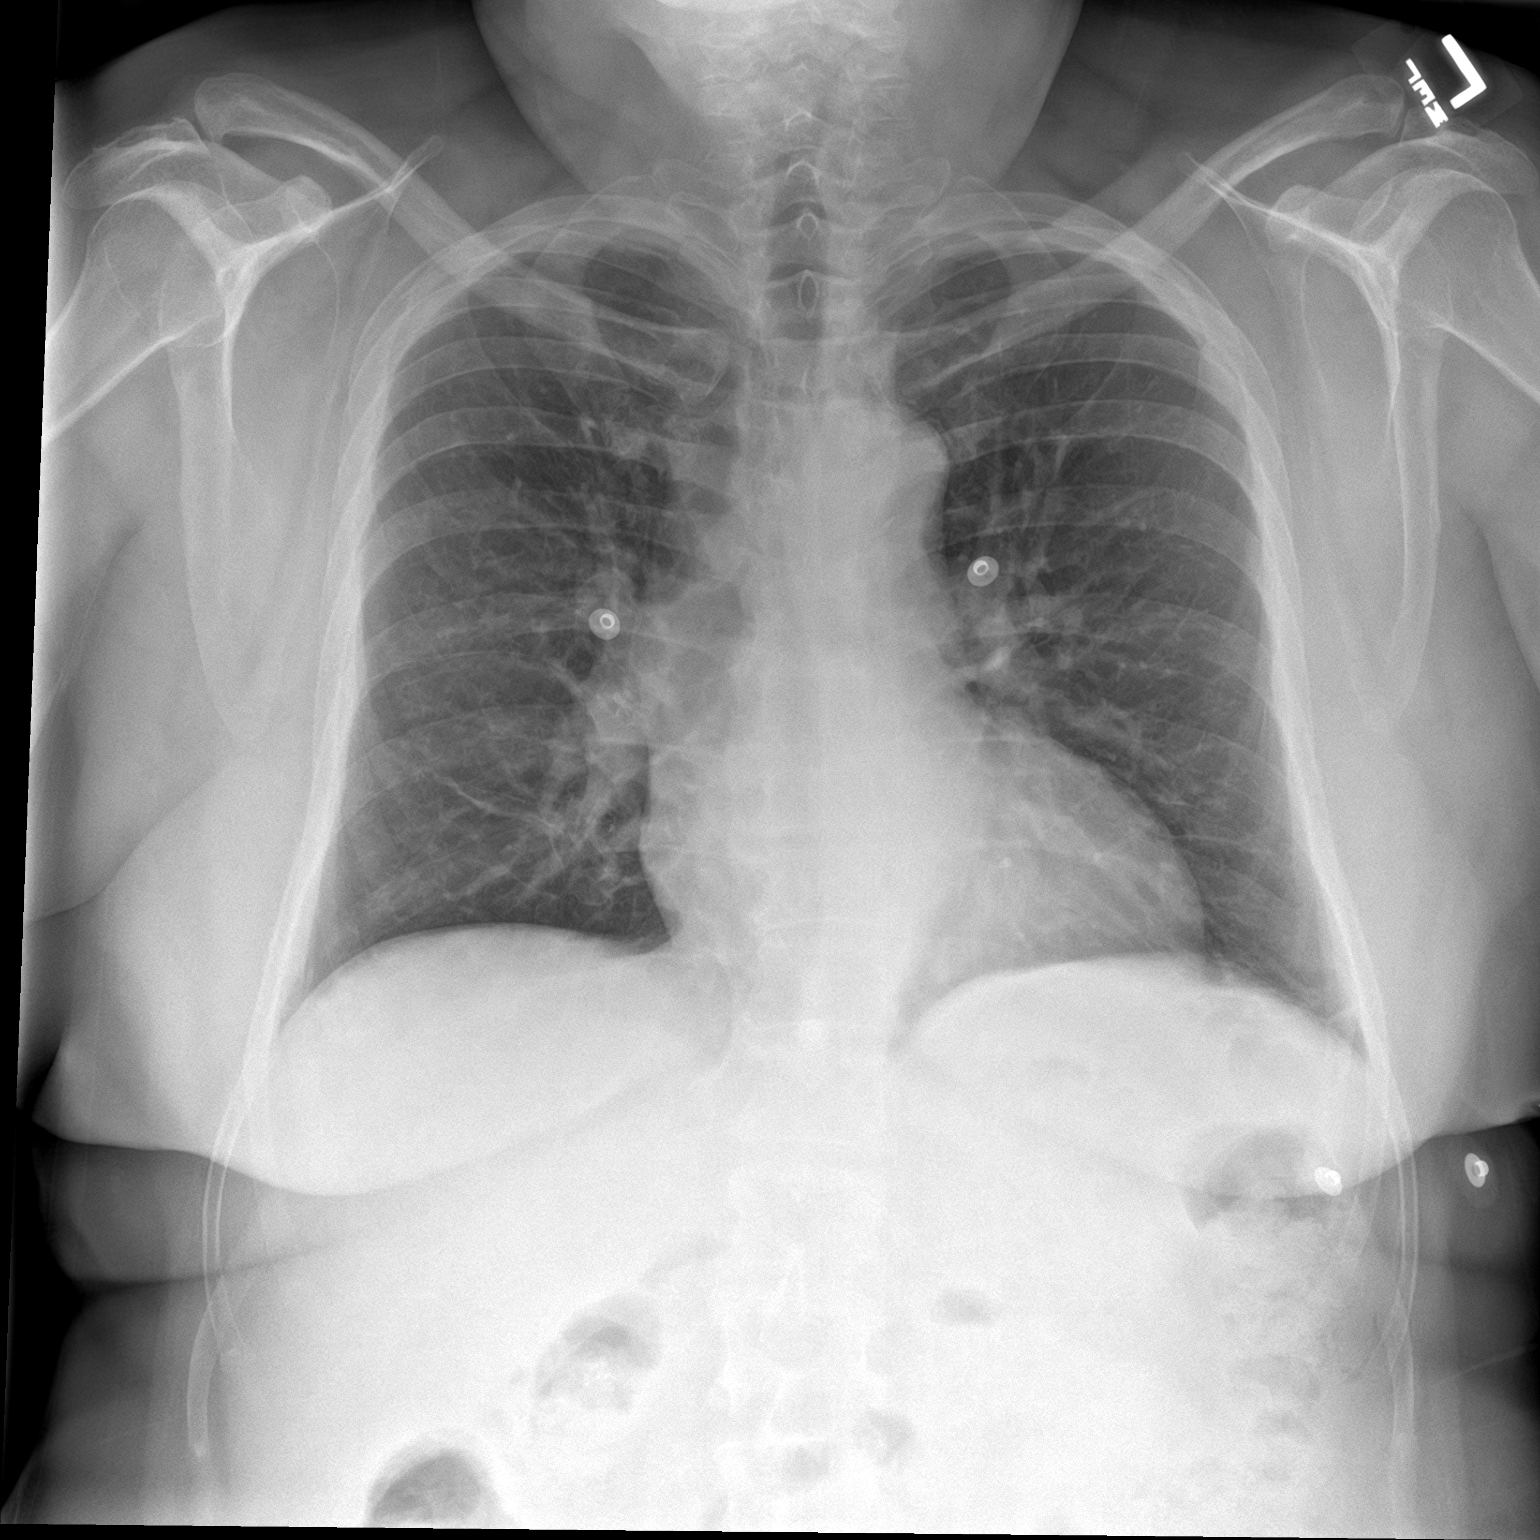

[chest lat]
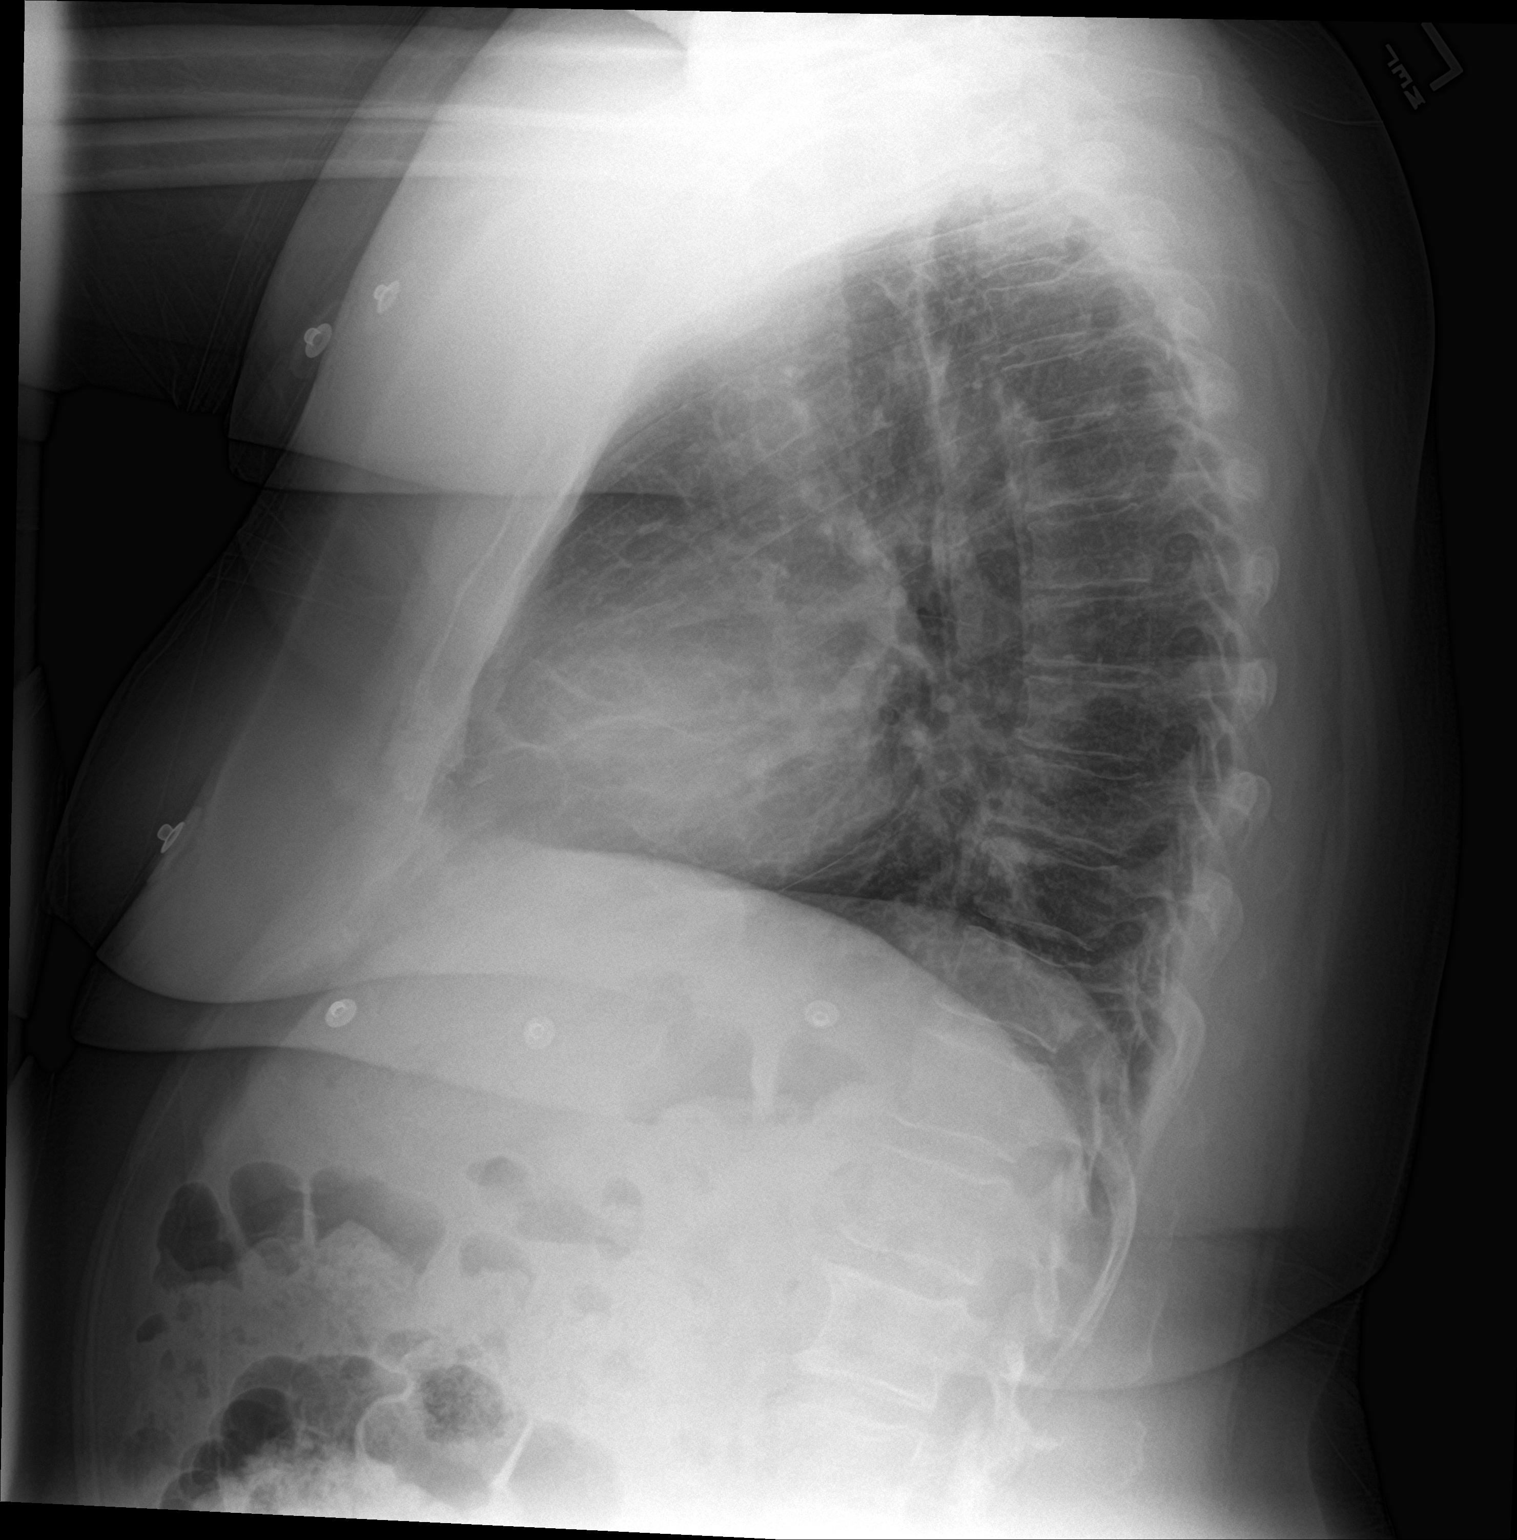

[2 of 2 positions shown; findings below may reference images not displayed]

FINDINGS: Normal sized heart. Stable linear scarring in both lower lung zones.
Otherwise, clear lungs. Mild thoracolumbar spine degenerative
changes and scoliosis.
IMPRESSION: No acute abnormality.

## 2019-08-26 DIAGNOSIS — Z79899 Other long term (current) drug therapy: Secondary | ICD-10-CM | POA: Diagnosis not present

## 2019-08-26 DIAGNOSIS — M47817 Spondylosis without myelopathy or radiculopathy, lumbosacral region: Secondary | ICD-10-CM | POA: Diagnosis not present

## 2019-08-26 DIAGNOSIS — M62830 Muscle spasm of back: Secondary | ICD-10-CM | POA: Diagnosis not present

## 2019-08-26 DIAGNOSIS — Z20822 Contact with and (suspected) exposure to covid-19: Secondary | ICD-10-CM | POA: Diagnosis not present

## 2019-08-26 DIAGNOSIS — E559 Vitamin D deficiency, unspecified: Secondary | ICD-10-CM | POA: Diagnosis not present

## 2019-08-26 DIAGNOSIS — M129 Arthropathy, unspecified: Secondary | ICD-10-CM | POA: Diagnosis not present

## 2019-08-26 DIAGNOSIS — M25572 Pain in left ankle and joints of left foot: Secondary | ICD-10-CM | POA: Diagnosis not present

## 2019-09-05 ENCOUNTER — Encounter: Payer: Self-pay | Admitting: Family Medicine

## 2019-09-26 DIAGNOSIS — M25572 Pain in left ankle and joints of left foot: Secondary | ICD-10-CM | POA: Diagnosis not present

## 2019-09-26 DIAGNOSIS — M62830 Muscle spasm of back: Secondary | ICD-10-CM | POA: Diagnosis not present

## 2019-09-26 DIAGNOSIS — Z9181 History of falling: Secondary | ICD-10-CM | POA: Diagnosis not present

## 2019-09-26 DIAGNOSIS — Z79899 Other long term (current) drug therapy: Secondary | ICD-10-CM | POA: Diagnosis not present

## 2019-09-26 DIAGNOSIS — M47817 Spondylosis without myelopathy or radiculopathy, lumbosacral region: Secondary | ICD-10-CM | POA: Diagnosis not present

## 2019-09-26 DIAGNOSIS — Z7189 Other specified counseling: Secondary | ICD-10-CM | POA: Diagnosis not present

## 2019-10-07 ENCOUNTER — Ambulatory Visit: Payer: Medicare Other | Attending: Internal Medicine

## 2019-10-07 DIAGNOSIS — Z23 Encounter for immunization: Secondary | ICD-10-CM

## 2019-10-07 NOTE — Progress Notes (Signed)
   Covid-19 Vaccination Clinic  Name:  Kayla Wright    MRN: [id number] DOB: 07-31-1955  10/07/2019  [name] was observed post Covid-19 immunization for 15 minutes without incident. She was provided with Vaccine Information Sheet and instruction to access the V-Safe system.   [name] was instructed to call 911 with any severe reactions post vaccine: Marland Kitchen Difficulty breathing  . Swelling of face and throat  . A fast heartbeat  . A bad rash all over body  . Dizziness and weakness   Immunizations Administered    Name Date Dose VIS Date Route   Pfizer COVID-19 Vaccine 10/07/2019  2:50 PM 0.3 mL 03/20/2018 Intramuscular   Manufacturer: [employer], [employer]   Lot: [id number]   NDC: [address]-5

## 2019-10-25 DIAGNOSIS — Z79899 Other long term (current) drug therapy: Secondary | ICD-10-CM | POA: Diagnosis not present

## 2019-10-25 DIAGNOSIS — M47817 Spondylosis without myelopathy or radiculopathy, lumbosacral region: Secondary | ICD-10-CM | POA: Diagnosis not present

## 2019-10-25 DIAGNOSIS — Z7189 Other specified counseling: Secondary | ICD-10-CM | POA: Diagnosis not present

## 2019-10-25 DIAGNOSIS — M62830 Muscle spasm of back: Secondary | ICD-10-CM | POA: Diagnosis not present

## 2019-10-25 DIAGNOSIS — Z9181 History of falling: Secondary | ICD-10-CM | POA: Diagnosis not present

## 2019-10-25 DIAGNOSIS — M25572 Pain in left ankle and joints of left foot: Secondary | ICD-10-CM | POA: Diagnosis not present

## 2019-10-28 ENCOUNTER — Ambulatory Visit: Payer: Medicare Other | Attending: Internal Medicine

## 2019-10-28 DIAGNOSIS — Z23 Encounter for immunization: Secondary | ICD-10-CM

## 2019-10-28 NOTE — Progress Notes (Signed)
   Covid-19 Vaccination Clinic  Name:  ANTASIA HAIDER    MRN: [id number] DOB: 23-May-1955  10/28/2019  Ms. Pillard was observed post Covid-19 immunization for 15 minutes without incident. She was provided with Vaccine Information Sheet and instruction to access the V-Safe system.   Ms. Crammer was instructed to call 911 with any severe reactions post vaccine: Marland Kitchen Difficulty breathing  . Swelling of face and throat  . A fast heartbeat  . A bad rash all over body  . Dizziness and weakness   Immunizations Administered    Name Date Dose VIS Date Route   Pfizer COVID-19 Vaccine 10/28/2019  2:46 PM 0.3 mL 03/20/2018 Intramuscular   Manufacturer: [employer], [employer]   Lot: [id number]   NDC: [address]-3

## 2019-11-08 ENCOUNTER — Ambulatory Visit: Payer: Self-pay

## 2019-11-11 ENCOUNTER — Ambulatory Visit (INDEPENDENT_AMBULATORY_CARE_PROVIDER_SITE_OTHER): Payer: Medicare Other | Admitting: Internal Medicine

## 2019-11-11 VITALS — BP 122/65 | HR 71 | Ht 68.0 in | Wt 221.0 lb

## 2019-11-11 DIAGNOSIS — Z9989 Dependence on other enabling machines and devices: Secondary | ICD-10-CM

## 2019-11-11 DIAGNOSIS — G4733 Obstructive sleep apnea (adult) (pediatric): Secondary | ICD-10-CM | POA: Diagnosis not present

## 2019-11-11 DIAGNOSIS — Z7189 Other specified counseling: Secondary | ICD-10-CM

## 2019-11-11 DIAGNOSIS — E038 Other specified hypothyroidism: Secondary | ICD-10-CM | POA: Diagnosis not present

## 2019-11-11 DIAGNOSIS — J45909 Unspecified asthma, uncomplicated: Secondary | ICD-10-CM

## 2019-11-11 DIAGNOSIS — I10 Essential (primary) hypertension: Secondary | ICD-10-CM

## 2019-11-11 NOTE — Patient Instructions (Signed)

## 2019-11-11 NOTE — Progress Notes (Signed)
[facility] [address] [place], [place] [address]  Pulmonary Sleep Medicine   Office Visit Note  Patient Name: Kayla Wright DOB: 08-01-1955 MRN [id number]    Chief Complaint: Obstructive Sleep Apnea visit  Brief History:  Ludell is seen today for follow up The patient has a 1+ year history of sleep apnea. Patient is using PAP nightly.  The patient feels more rested after sleeping with PAP.  The patient reports benefiting. from PAP use. She is nodding off in the living room without her mask at times. She sometimes has to get up and go to her son'ts house.Reported sleepiness is  improved and the Epworth Sleepiness Score is 4 out of 24. The patient rarely take naps. The patient complains of the following: no complaints  The compliance download shows excellent compliance with an average use time of 7.3 hours. The AHI is 2.6  The patient does not complain of limb movements disrupting sleep.  ROS  General: (-) fever, (-) chills, (-) night sweat Nose and Sinuses: (-) nasal stuffiness or itchiness, (-) postnasal drip, (-) nosebleeds, (-) sinus trouble. Mouth and Throat: (-) sore throat, (-) hoarseness. Neck: (-) swollen glands, (-) enlarged thyroid, (-) neck pain. Respiratory: - cough, - shortness of breath, - wheezing. Neurologic: - numbness, - tingling. Psychiatric: - anxiety, - depression   Current Medication: Outpatient Encounter Medications as of 11/11/2019  Medication Sig  . albuterol (VENTOLIN HFA) 108 (90 Base) MCG/ACT inhaler Inhale 2 puffs into the lungs every 6 (six) hours as needed for wheezing or shortness of breath.  . Ascorbic Acid (VITAMIN C) 1000 MG tablet Take 1,000 mg by mouth daily.  . Cholecalciferol (VITAMIN D3) 5000 units CAPS Take 5,000 Units by mouth daily.  . cloNIDine (CATAPRES) 0.2 MG tablet Take 1 tablet (0.2 mg total) by mouth 3 (three) times daily.  . Cyanocobalamin (VITAMIN B-12 PO) Take 1 tablet by mouth daily.  Marland Kitchen FLUoxetine (PROZAC) 20 MG  capsule Take 1 capsule (20 mg total) by mouth daily. Take alongside the 40 mg prozac to total 60 mg  . FLUoxetine (PROZAC) 40 MG capsule Take 1 capsule (40 mg total) by mouth daily.  . hydrOXYzine (ATARAX/VISTARIL) 25 MG tablet Take 1 tablet (25 mg total) by mouth at bedtime as needed.  Marland Kitchen levothyroxine (SYNTHROID) 50 MCG tablet Take 1 tablet (50 mcg total) by mouth daily before breakfast.  . oxyCODONE-acetaminophen (PERCOCET/ROXICET) 5-325 MG tablet Take 1 tablet by mouth 4 (four) times daily as needed.  Marland Kitchen QUEtiapine Fumarate (SEROQUEL XR) 150 MG 24 hr tablet Take 1 tablet (150 mg total) by mouth at bedtime.  . simvastatin (ZOCOR) 40 MG tablet Take 1 tablet (40 mg total) by mouth daily.  Marland Kitchen umeclidinium-vilanterol (ANORO ELLIPTA) 62.5-25 MCG/INH AEPB Inhale 1 puff into the lungs daily.   No facility-administered encounter medications on file as of 11/11/2019.    Surgical History: Past Surgical History:  Procedure Laterality Date  . ANKLE FRACTURE SURGERY  06/18/15  . COLONOSCOPY WITH PROPOFOL N/A 08/31/2017   Procedure: COLONOSCOPY WITH PROPOFOL;  Surgeon: Toney Reil, MD;  [facility] ENDOSCOPY;  Service: Gastroenterology;  Laterality: N/A;  . TUBAL LIGATION      Medical History: Past Medical History:  Diagnosis Date  . Allergy   . Common migraine   . Depression   . GERD (gastroesophageal reflux disease)   . Hyperlipidemia   . Hypertension   . Insomnia   . Menopausal symptom   . Osteopenia   . Thyroid disease  Family History: Non contributory to the present illness  Social History: Social History   Socioeconomic History  . Marital status: Divorced    Spouse name: Not on file  . Number of children: Not on file  . Years of education: Not on file  . Highest education level: Not on file  Occupational History  . Not on file  Tobacco Use  . Smoking status: Never Smoker  . Smokeless tobacco: Never Used  Vaping Use  . Vaping Use: Never used  Substance and  Sexual Activity  . Alcohol use: No    Alcohol/week: 0.0 standard drinks  . Drug use: No  . Sexual activity: Never    Birth control/protection: Surgical  Other Topics Concern  . Not on file  Social History Narrative  . Not on file   Social Determinants of Health   Financial Resource Strain:   . Difficulty of Paying Living Expenses: Not on file  Food Insecurity:   . Worried About Programme researcher, broadcasting/film/video in the Last Year: Not on file  . Ran Out of Food in the Last Year: Not on file  Transportation Needs:   . Lack of Transportation (Medical): Not on file  . Lack of Transportation (Non-Medical): Not on file  Physical Activity:   . Days of Exercise per Week: Not on file  . Minutes of Exercise per Session: Not on file  Stress:   . Feeling of Stress : Not on file  Social Connections:   . Frequency of Communication with Friends and Family: Not on file  . Frequency of Social Gatherings with Friends and Family: Not on file  . Attends Religious Services: Not on file  . Active Member of Clubs or Organizations: Not on file  . Attends Banker Meetings: Not on file  . Marital Status: Not on file  Intimate Partner Violence:   . Fear of Current or Ex-Partner: Not on file  . Emotionally Abused: Not on file  . Physically Abused: Not on file  . Sexually Abused: Not on file    Vital Signs: Blood pressure 122/65, pulse 71, height 5\' 8"  (1.727 m), weight 221 lb (100.2 kg), SpO2 97 %.  Examination: General Appearance: The patient is well-developed, well-nourished, and in no distress. Neck Circumference: 42 Skin: Gross inspection of skin unremarkable. Head: normocephalic, no gross deformities. Eyes: no gross deformities noted. ENT: ears appear grossly normal Neurologic: Alert and oriented. No involuntary movements.    EPWORTH SLEEPINESS SCALE:  Scale:  (0)= no chance of dozing; (1)= slight chance of dozing; (2)= moderate chance of dozing; (3)= high chance of dozing  Chance   Situtation    Sitting and reading: 1    Watching TV: 1    Sitting Inactive in public: 1    As a passenger in car: 0      Lying down to rest: 0    Sitting and talking: 0    Sitting quielty after lunch: 1    In a car, stopped in traffic: 0   TOTAL SCORE:   4 out of 24    SLEEP STUDIES:  1. PSG 05/22/18 AHI 44 SpO75min 79%   CPAP COMPLIANCE DATA:  Date Range: 11/08/18-11/08/19  Average Daily Use: 7.3 hours  Median Use: 7.5  Compliance for > 4 Hours: 94%  AHI: 2.6 respiratory events per hour  Days Used: 343/365  Mask Leak: 28.4  95th Percentile Pressure: 14.9         LABS: No results found for this  or any previous visit (from the past 2160 hour(s)).  Radiology: MM 3D SCREEN BREAST BILATERAL  Result Date: 10/31/2018 CLINICAL DATA:  Screening. EXAM: DIGITAL SCREENING BILATERAL MAMMOGRAM WITH TOMO AND CAD COMPARISON:  Previous exam(s). ACR Breast Density Category a: The breast tissue is almost entirely fatty. FINDINGS: There are no findings suspicious for malignancy. Images were processed with CAD. IMPRESSION: No mammographic evidence of malignancy. A result letter of this screening mammogram will be mailed directly to the patient. RECOMMENDATION: Screening mammogram in one year. (Code:[id number]-B-01Y) BI-RADS CATEGORY  1: Negative. Electronically Signed   By: Frederico HammanMichelle  Collins M.D.   On: 10/31/2018 15:24    No results found.  No results found.    Assessment and Plan: Patient Active Problem List   Diagnosis Date Noted  . IFG (impaired fasting glucose) 07/17/2019  . Chest pain with high risk for cardiac etiology 09/27/2017  . Essential hypertension 09/27/2017  . Fatigue due to depression 02/03/2017  . Cocaine use 12/06/2016  . Pain medication agreement broken 12/06/2016  . Osteoporosis of lumbar spine 11/09/2016  . Unspecified fracture of left talus, sequela (left ankle pain) 11/09/2016  . Neurogenic pain 11/09/2016  . Chronic bilateral low back pain  with left-sided sciatica 11/09/2016  . History of ankle fracture (Left) 10/11/2016  . Acute Herpes zoster without complication 10/11/2016  . DDD (degenerative disc disease), cervical (C5-6 and C6-7) 10/11/2016  . Chronic Cervical foraminal stenosis (C3-4) (Right) 10/11/2016  . Osteopenia of lumbar spine 10/11/2016  . DDD (degenerative disc disease), lumbar (L1-2 and L5-S1) 10/11/2016  . Shingles rash (Secondary Area of Pain) (Right) (Buttocks) 10/10/2016  . Chronic ankle pain (Primary Area of Pain) (Left) 10/09/2016  . Long term prescription benzodiazepine use 10/09/2016  . Chronic lower extremity pain (Left) 08/29/2016  . Chronic pain syndrome 08/29/2016  . Long term current use of opiate analgesic 08/29/2016  . Long term prescription opiate use 08/29/2016  . Opiate use 08/29/2016  . Obesity (BMI 30-39.9) 08/09/2016  . Asthma 08/09/2016  . Allergic rhinitis 06/17/2016  . Insomnia 11/02/2015  . Murmur 07/10/2015  . SOB (shortness of breath) on exertion 07/10/2015  . Severe depression (HCC) 10/21/2014  . Chronic anxiety 10/21/2014  . Hyperlipidemia 10/21/2014  . Hypothyroidism 10/21/2014  . Anxiety disorder 10/21/2014  . Major depressive disorder, single episode 10/21/2014  . Sleep apnea 07/02/2014      The patient does tolerate PAP and reports significant benefit from PAP use. The patient was reminded how to clean the CPAP  and advised to set a timer when sitting in the evening to prevent sleeping in the living room. The patient was also counselled on the importance of weight loss in treating the sleep apnea. She is walking and watching her diet. The compliance is very good. The apnea is well controlled.   1. OSA- continue nightly use of CPAP and avoid sleeping without it.  2. CPAP couseling-Discussed importance of adequate CPAP use as well as proper care and cleaning techniques of machine and all supplies. 3. HTN: BP well controlled today. Continue continue current therapy and  followup with PCP.  4. Asthma: Using ANORO every other day. Symptoms well-controlled, rarely feels need to use Albuterol inhaler. 5. Hypothyroidism: Using synthroid 50 mcg daily. TSH normal last visit. Due for annual physical soon.   General Counseling: I have discussed the findings of the evaluation and examination with Windell Mouldinguth.  I have also discussed any further diagnostic evaluation thatmay be needed or ordered today. Windell MouldingRuth verbalizes understanding of the findings  of todays visit. We also reviewed her medications today and discussed drug interactions and side effects including but not limited excessive drowsiness and altered mental states. We also discussed that there is always a risk not just to her but also people around her. she has been encouraged to call the office with any questions or concerns that should arise related to todays visit.  I have personally obtained a history, examined the patient, evaluated laboratory and imaging results, formulated the assessment and plan and placed orders.  This patient was seen by Leeanne Deed AGNP-C in Collaboration with Dr. Freda Munro as a part of collaborative care agreement.   Valentino Hue Sol Blazing, PhD, FAASM  Diplomate, American Board of Sleep Medicine    Yevonne Pax, MD [facility] Diplomate ABMS Pulmonary and Critical Care Medicine Sleep medicine

## 2019-11-25 DIAGNOSIS — Z9181 History of falling: Secondary | ICD-10-CM | POA: Diagnosis not present

## 2019-11-25 DIAGNOSIS — Z79899 Other long term (current) drug therapy: Secondary | ICD-10-CM | POA: Diagnosis not present

## 2019-11-25 DIAGNOSIS — M25572 Pain in left ankle and joints of left foot: Secondary | ICD-10-CM | POA: Diagnosis not present

## 2019-11-25 DIAGNOSIS — M62830 Muscle spasm of back: Secondary | ICD-10-CM | POA: Diagnosis not present

## 2019-11-25 DIAGNOSIS — M47817 Spondylosis without myelopathy or radiculopathy, lumbosacral region: Secondary | ICD-10-CM | POA: Diagnosis not present

## 2019-11-25 DIAGNOSIS — Z7185 Encounter for immunization safety counseling: Secondary | ICD-10-CM | POA: Diagnosis not present

## 2019-11-27 ENCOUNTER — Ambulatory Visit (INDEPENDENT_AMBULATORY_CARE_PROVIDER_SITE_OTHER): Payer: Medicare Other

## 2019-11-27 ENCOUNTER — Other Ambulatory Visit: Payer: Self-pay | Admitting: Family Medicine

## 2019-11-27 ENCOUNTER — Other Ambulatory Visit: Payer: Self-pay

## 2019-11-27 DIAGNOSIS — Z23 Encounter for immunization: Secondary | ICD-10-CM

## 2019-11-27 MED ORDER — LEVOTHYROXINE SODIUM 50 MCG PO TABS
50.0000 ug | ORAL_TABLET | Freq: Every day | ORAL | 1 refills | Status: DC
Start: 2019-11-27 — End: 2020-05-11

## 2019-11-27 MED ORDER — HYDROXYZINE HCL 25 MG PO TABS
25.0000 mg | ORAL_TABLET | Freq: Every evening | ORAL | 1 refills | Status: DC | PRN
Start: 2019-11-27 — End: 2020-08-03

## 2019-11-27 MED ORDER — SIMVASTATIN 40 MG PO TABS
40.0000 mg | ORAL_TABLET | Freq: Every day | ORAL | 1 refills | Status: DC
Start: 2019-11-27 — End: 2020-04-08

## 2019-11-27 MED ORDER — QUETIAPINE FUMARATE ER 150 MG PO TB24
150.0000 mg | ORAL_TABLET | Freq: Every day | ORAL | 1 refills | Status: DC
Start: 2019-11-27 — End: 2020-01-15

## 2019-11-27 MED ORDER — UMECLIDINIUM-VILANTEROL 62.5-25 MCG/INH IN AEPB
1.0000 | INHALATION_SPRAY | Freq: Every day | RESPIRATORY_TRACT | 0 refills | Status: DC
Start: 2019-11-27 — End: 2021-07-13

## 2019-11-27 MED ORDER — CLONIDINE HCL 0.2 MG PO TABS
0.2000 mg | ORAL_TABLET | Freq: Three times a day (TID) | ORAL | 1 refills | Status: DC
Start: 2019-11-27 — End: 2020-04-08

## 2019-11-27 MED ORDER — ALBUTEROL SULFATE HFA 108 (90 BASE) MCG/ACT IN AERS: 2.0000 | INHALATION_SPRAY | Freq: Four times a day (QID) | RESPIRATORY_TRACT | 1 refills | Status: DC | PRN

## 2019-11-27 MED ORDER — FLUOXETINE HCL 20 MG PO CAPS
20.0000 mg | ORAL_CAPSULE | Freq: Every day | ORAL | 1 refills | Status: DC
Start: 2019-11-27 — End: 2019-12-06

## 2019-11-27 MED ORDER — FLUOXETINE HCL 40 MG PO CAPS
40.0000 mg | ORAL_CAPSULE | Freq: Every day | ORAL | 1 refills | Status: DC
Start: 2019-11-27 — End: 2020-06-26

## 2019-11-27 NOTE — Telephone Encounter (Signed)
Patient is requesting that all medications be refilled and sent to [facility] so she is able to get through mail order. DO NOT send any refills to previous pharmacy.  Call Ms. Demeo @  [phone] with questions.  Pharmacy Phone: [phone] Fax: [phone]

## 2019-11-27 NOTE — Telephone Encounter (Signed)
Previous RL patient. Last seen 07/15/19 and has appointment 01/14/20. Just needs all prescriptions sent to [facility] as she has started using them now.

## 2019-12-05 ENCOUNTER — Other Ambulatory Visit: Payer: Self-pay | Admitting: Family Medicine

## 2019-12-05 NOTE — Telephone Encounter (Signed)
Requested medication (s) are due for refill today: no  Requested medication (s) are on the active medication list:yes  Last refill: 11/27/19   Future visit scheduled:yes  Notes to clinic:  attempted to contact patient. Rx sent to Mail order last week. Left VM for her return call to office for clarification as to which pharmacy she want s to use.    Requested Prescriptions  Pending Prescriptions Disp Refills   FLUoxetine (PROZAC) 20 MG capsule [Pharmacy Med Name: FLUOXETINE HCL 20 MG CAPSULE] 90 capsule 0    Sig: Take 1 capsule (20 mg total) by mouth daily. Take alongside the 40 mg prozac to total 60 mg      Psychiatry:  Antidepressants - SSRI Passed - 12/05/2019 11:32 AM      Passed - Completed PHQ-2 or PHQ-9 in the last 360 days      Passed - Valid encounter within last 6 months    Recent Outpatient Visits           3 months ago Chronic anxiety   [facility] [place], [place]   4 months ago Chronic anxiety   [facility] [place], [place]   1 year ago Hyperlipidemia, unspecified hyperlipidemia type   [facility], [place], [place]   1 year ago Primary insomnia   [facility] [place], [place], [place]   1 year ago Primary insomnia   [facility] [place], [place], [place]       Future Appointments             In 1 month Cannady, Dorie Rank, NP [employer], [facility]

## 2019-12-20 ENCOUNTER — Other Ambulatory Visit: Payer: Self-pay | Admitting: Nurse Practitioner

## 2019-12-23 ENCOUNTER — Ambulatory Visit (INDEPENDENT_AMBULATORY_CARE_PROVIDER_SITE_OTHER): Payer: Medicare Other

## 2019-12-23 VITALS — Ht 68.0 in | Wt 210.0 lb

## 2019-12-23 DIAGNOSIS — Z0000 Encounter for general adult medical examination without abnormal findings: Secondary | ICD-10-CM

## 2019-12-23 NOTE — Patient Instructions (Signed)
Kayla Wright , Thank you for taking time to come for your Medicare Wellness Visit. I appreciate your ongoing commitment to your health goals. Please review the following plan we discussed and let me know if I can assist you in the future.   Screening recommendations/referrals: Colonoscopy: completed 08/31/2017, due 09/01/2027 Mammogram: patient to schedule Bone Density: completed 06/17/2009 Recommended yearly ophthalmology/optometry visit for glaucoma screening and checkup Recommended yearly dental visit for hygiene and checkup  Vaccinations: Influenza vaccine: completed 11/27/2019, due 08/24/2020 Pneumococcal vaccine: n/a Tdap vaccine: completed 01/27/2016, due 01/26/2026 Shingles vaccine: discussed  Covid-19:  10/28/2019, 10/07/2019  Advanced directives: Please bring a copy of your POA (Power of Attorney) and/or Living Will to your next appointment.   Conditions/risks identified: none  Next appointment: Follow up in one year for your annual wellness visit.   Preventive Care 40-64 Years, Female Preventive care refers to lifestyle choices and visits with your health care provider that can promote health and wellness. What does preventive care include?  A yearly physical exam. This is also called an annual well check.  Dental exams once or twice a year.  Routine eye exams. Ask your health care provider how often you should have your eyes checked.  Personal lifestyle choices, including:  Daily care of your teeth and gums.  Regular physical activity.  Eating a healthy diet.  Avoiding tobacco and drug use.  Limiting alcohol use.  Practicing safe sex.  Taking low-dose aspirin daily starting at age 80.  Taking vitamin and mineral supplements as recommended by your health care provider. What happens during an annual well check? The services and screenings done by your health care provider during your annual well check will depend on your age, overall health, lifestyle risk factors, and  family history of disease. Counseling  Your health care provider may ask you questions about your:  Alcohol use.  Tobacco use.  Drug use.  Emotional well-being.  Home and relationship well-being.  Sexual activity.  Eating habits.  Work and work Statistician.  Method of birth control.  Menstrual cycle.  Pregnancy history. Screening  You may have the following tests or measurements:  Height, weight, and BMI.  Blood pressure.  Lipid and cholesterol levels. These may be checked every 5 years, or more frequently if you are over 72 years old.  Skin check.  Lung cancer screening. You may have this screening every year starting at age 9 if you have a 30-pack-year history of smoking and currently smoke or have quit within the past 15 years.  Fecal occult blood test (FOBT) of the stool. You may have this test every year starting at age 39.  Flexible sigmoidoscopy or colonoscopy. You may have a sigmoidoscopy every 5 years or a colonoscopy every 10 years starting at age 42.  Hepatitis C blood test.  Hepatitis B blood test.  Sexually transmitted disease (STD) testing.  Diabetes screening. This is done by checking your blood sugar (glucose) after you have not eaten for a while (fasting). You may have this done every 1-3 years.  Mammogram. This may be done every 1-2 years. Talk to your health care provider about when you should start having regular mammograms. This may depend on whether you have a family history of breast cancer.  BRCA-related cancer screening. This may be done if you have a family history of breast, ovarian, tubal, or peritoneal cancers.  Pelvic exam and Pap test. This may be done every 3 years starting at age 39. Starting at age 79, this  may be done every 5 years if you have a Pap test in combination with an HPV test.  Bone density scan. This is done to screen for osteoporosis. You may have this scan if you are at high risk for osteoporosis. Discuss your  test results, treatment options, and if necessary, the need for more tests with your health care provider. Vaccines  Your health care provider may recommend certain vaccines, such as:  Influenza vaccine. This is recommended every year.  Tetanus, diphtheria, and acellular pertussis (Tdap, Td) vaccine. You may need a Td booster every 10 years.  Zoster vaccine. You may need this after age 63.  Pneumococcal 13-valent conjugate (PCV13) vaccine. You may need this if you have certain conditions and were not previously vaccinated.  Pneumococcal polysaccharide (PPSV23) vaccine. You may need one or two doses if you smoke cigarettes or if you have certain conditions. Talk to your health care provider about which screenings and vaccines you need and how often you need them. This information is not intended to replace advice given to you by your health care provider. Make sure you discuss any questions you have with your health care provider. Document Released: 02/06/2015 Document Revised: 09/30/2015 Document Reviewed: 11/11/2014 Elsevier Interactive Patient Education  2017 [place] Prevention in the Home Falls can cause injuries. They can happen to people of all ages. There are many things you can do to make your home safe and to help prevent falls. What can I do on the outside of my home?  Regularly fix the edges of walkways and driveways and fix any cracks.  Remove anything that might make you trip as you walk through a door, such as a raised step or threshold.  Trim any bushes or trees on the path to your home.  Use bright outdoor lighting.  Clear any walking paths of anything that might make someone trip, such as rocks or tools.  Regularly check to see if handrails are loose or broken. Make sure that both sides of any steps have handrails.  Any raised decks and porches should have guardrails on the edges.  Have any leaves, snow, or ice cleared regularly.  Use sand or  salt on walking paths during winter.  Clean up any spills in your garage right away. This includes oil or grease spills. What can I do in the bathroom?  Use night lights.  Install grab bars by the toilet and in the tub and shower. Do not use towel bars as grab bars.  Use non-skid mats or decals in the tub or shower.  If you need to sit down in the shower, use a plastic, non-slip stool.  Keep the floor dry. Clean up any water that spills on the floor as soon as it happens.  Remove soap buildup in the tub or shower regularly.  Attach bath mats securely with double-sided non-slip rug tape.  Do not have throw rugs and other things on the floor that can make you trip. What can I do in the bedroom?  Use night lights.  Make sure that you have a light by your bed that is easy to reach.  Do not use any sheets or blankets that are too big for your bed. They should not hang down onto the floor.  Have a firm chair that has side arms. You can use this for support while you get dressed.  Do not have throw rugs and other things on the floor that can make you  trip. What can I do in the kitchen?  Clean up any spills right away.  Avoid walking on wet floors.  Keep items that you use a lot in easy-to-reach places.  If you need to reach something above you, use a strong step stool that has a grab bar.  Keep electrical cords out of the way.  Do not use floor polish or wax that makes floors slippery. If you must use wax, use non-skid floor wax.  Do not have throw rugs and other things on the floor that can make you trip. What can I do with my stairs?  Do not leave any items on the stairs.  Make sure that there are handrails on both sides of the stairs and use them. Fix handrails that are broken or loose. Make sure that handrails are as long as the stairways.  Check any carpeting to make sure that it is firmly attached to the stairs. Fix any carpet that is loose or worn.  Avoid having  throw rugs at the top or bottom of the stairs. If you do have throw rugs, attach them to the floor with carpet tape.  Make sure that you have a light switch at the top of the stairs and the bottom of the stairs. If you do not have them, ask someone to add them for you. What else can I do to help prevent falls?  Wear shoes that:  Do not have high heels.  Have rubber bottoms.  Are comfortable and fit you well.  Are closed at the toe. Do not wear sandals.  If you use a stepladder:  Make sure that it is fully opened. Do not climb a closed stepladder.  Make sure that both sides of the stepladder are locked into place.  Ask someone to hold it for you, if possible.  Clearly mark and make sure that you can see:  Any grab bars or handrails.  First and last steps.  Where the edge of each step is.  Use tools that help you move around (mobility aids) if they are needed. These include:  Canes.  Walkers.  Scooters.  Crutches.  Turn on the lights when you go into a dark area. Replace any light bulbs as soon as they burn out.  Set up your furniture so you have a clear path. Avoid moving your furniture around.  If any of your floors are uneven, fix them.  If there are any pets around you, be aware of where they are.  Review your medicines with your doctor. Some medicines can make you feel dizzy. This can increase your chance of falling. Ask your doctor what other things that you can do to help prevent falls. This information is not intended to replace advice given to you by your health care provider. Make sure you discuss any questions you have with your health care provider. Document Released: 11/06/2008 Document Revised: 06/18/2015 Document Reviewed: 02/14/2014 Elsevier Interactive Patient Education  2017 [employer].

## 2019-12-23 NOTE — Progress Notes (Signed)
I connected with Kayla Wright today by telephone and verified that I am speaking with the correct person using two identifiers. Location patient: home Location provider: work Persons participating in the virtual visit: [place]ellarose, Burtt LPN.   I discussed the limitations, risks, security and privacy concerns of performing an evaluation and management service by telephone and the availability of in person appointments. I also discussed with the patient that there may be a patient responsible charge related to this service. The patient expressed understanding and verbally consented to this telephonic visit.    Interactive audio and video telecommunications were attempted between this provider and patient, however failed, due to patient having technical difficulties OR patient did not have access to video capability.  We continued and completed visit with audio only.     Vital signs may be patient reported or missing.  Subjective:   Kayla Wright is a 64 y.o. female who presents for an Initial Medicare Annual Wellness Visit.  Review of Systems     Cardiac Risk Factors include: dyslipidemia;hypertension;obesity (BMI >30kg/m2)     Objective:    Today's Vitals   12/23/19 1041  Weight: 210 lb (95.3 kg)  Height: 5\' 8"  (1.727 m)   Body mass index is 31.93 kg/m.  Advanced Directives 12/23/2019 08/31/2017 12/06/2016 11/09/2016 10/10/2016 08/29/2016 06/01/2016  Does Patient Have a Medical Advance Directive? Yes Yes Yes Yes Yes Yes Yes  Type of 08/01/2016 of [place];Living will Healthcare Power of [place];Living will Living will Living will Living will Living will -  Does patient want to make changes to medical advance directive? - - (No Data) - - - -  Copy of Healthcare Power of Attorney in Chart? No - copy requested - - - - - -  Some encounter information is confidential and restricted. Go to Review Flowsheets activity to see all data.    Current Medications  (verified) Outpatient Encounter Medications as of 12/23/2019  Medication Sig  . albuterol (VENTOLIN HFA) 108 (90 Base) MCG/ACT inhaler Inhale 2 puffs into the lungs every 6 (six) hours as needed for wheezing or shortness of breath.  . Ascorbic Acid (VITAMIN C) 1000 MG tablet Take 1,000 mg by mouth daily.  . Cholecalciferol (VITAMIN D3) 5000 units CAPS Take 5,000 Units by mouth daily.  . cloNIDine (CATAPRES) 0.2 MG tablet Take 1 tablet (0.2 mg total) by mouth 3 (three) times daily.  . Cyanocobalamin (VITAMIN B-12 PO) Take 1 tablet by mouth daily.  12/25/2019 FLUoxetine (PROZAC) 20 MG capsule Take 1 capsule (20 mg total) by mouth daily. Take alongside the 40 mg prozac to total 60 mg  . FLUoxetine (PROZAC) 40 MG capsule Take 1 capsule (40 mg total) by mouth daily.  . hydrOXYzine (ATARAX/VISTARIL) 25 MG tablet Take 1 tablet (25 mg total) by mouth at bedtime as needed.  Marland Kitchen levothyroxine (SYNTHROID) 50 MCG tablet Take 1 tablet (50 mcg total) by mouth daily before breakfast.  . oxyCODONE-acetaminophen (PERCOCET/ROXICET) 5-325 MG tablet Take 1 tablet by mouth 4 (four) times daily as needed.  Marland Kitchen QUEtiapine Fumarate (SEROQUEL XR) 150 MG 24 hr tablet Take 1 tablet (150 mg total) by mouth at bedtime.  . simvastatin (ZOCOR) 40 MG tablet Take 1 tablet (40 mg total) by mouth daily.  Marland Kitchen umeclidinium-vilanterol (ANORO ELLIPTA) 62.5-25 MCG/INH AEPB Inhale 1 puff into the lungs daily.   No facility-administered encounter medications on file as of 12/23/2019.    Allergies (verified) Patient has no known allergies.   History: Past Medical  History:  Diagnosis Date  . Allergy   . Common migraine   . Depression   . GERD (gastroesophageal reflux disease)   . Hyperlipidemia   . Hypertension   . Insomnia   . Menopausal symptom   . Osteopenia   . Thyroid disease    Past Surgical History:  Procedure Laterality Date  . ANKLE FRACTURE SURGERY  06/18/15  . COLONOSCOPY WITH PROPOFOL N/A 08/31/2017   Procedure: COLONOSCOPY  WITH PROPOFOL;  Surgeon: Toney Reil, MD;  Location: [facility] ENDOSCOPY;  Service: Gastroenterology;  Laterality: N/A;  . TUBAL LIGATION     Family History  Problem Relation Age of Onset  . Diabetes Mother   . Heart disease Mother   . Cirrhosis Mother   . Heart disease Father    Social History   Socioeconomic History  . Marital status: Divorced    Spouse name: Not on file  . Number of children: Not on file  . Years of education: Not on file  . Highest education level: Not on file  Occupational History  . Occupation: retired  Tobacco Use  . Smoking status: Never Smoker  . Smokeless tobacco: Never Used  Vaping Use  . Vaping Use: Never used  Substance and Sexual Activity  . Alcohol use: No    Alcohol/week: 0.0 standard drinks  . Drug use: Yes    Types: Oxycodone  . Sexual activity: Not Currently    Birth control/protection: Surgical  Other Topics Concern  . Not on file  Social History Narrative  . Not on file   Social Determinants of Health   Financial Resource Strain: Low Risk   . Difficulty of Paying Living Expenses: Not hard at all  Food Insecurity: No Food Insecurity  . Worried About Programme researcher, broadcasting/film/video in the Last Year: Never true  . Ran Out of Food in the Last Year: Never true  Transportation Needs: No Transportation Needs  . Lack of Transportation (Medical): No  . Lack of Transportation (Non-Medical): No  Physical Activity: Sufficiently Active  . Days of Exercise per Week: 4 days  . Minutes of Exercise per Session: 60 min  Stress: No Stress Concern Present  . Feeling of Stress : Not at all  Social Connections:   . Frequency of Communication with Friends and Family: Not on file  . Frequency of Social Gatherings with Friends and Family: Not on file  . Attends Religious Services: Not on file  . Active Member of Clubs or Organizations: Not on file  . Attends Banker Meetings: Not on file  . Marital Status: Not on file    Tobacco  Counseling Counseling given: Not Answered   Clinical Intake:  Pre-visit preparation completed: Yes  Pain : No/denies pain     Nutritional Status: BMI > 30  Obese Nutritional Risks: None Diabetes: No  How often do you need to have someone help you when you read instructions, pamphlets, or other written materials from your doctor or pharmacy?: 1 - Never What is the last grade level you completed in school?: 9th grade  Diabetic? no  Interpreter Needed?: No  Information entered by :: NAllen LPN   Activities of Daily Living In your present state of health, do you have any difficulty performing the following activities: 12/23/2019 07/15/2019  Hearing? N N  Vision? N N  Difficulty concentrating or making decisions? Y Y  Comment a little -  Walking or climbing stairs? N N  Dressing or bathing? N N  Doing  errands, shopping? N Y  Quarry managerreparing Food and eating ? N -  Using the Toilet? N -  In the past six months, have you accidently leaked urine? N -  Do you have problems with loss of bowel control? N -  Managing your Medications? N -  Managing your Finances? N -  Housekeeping or managing your Housekeeping? N -  Some recent data might be hidden    Patient Care Team: Particia NearingLane, Rachel Elizabeth, PA-C as PCP - General (Family Medicine) Marlowe Saxate, Pamela J, RN as Case Manager (General Practice)  Indicate any recent Medical Services you may have received from other than [facility] providers in the past year (date may be approximate).     Assessment:   This is a routine wellness examination for Kayla Wright.  Hearing/Vision screen  Hearing Screening   125Hz  250Hz  500Hz  1000Hz  2000Hz  3000Hz  4000Hz  6000Hz  8000Hz   Right ear:           Left ear:           Vision Screening Comments: No regular eye exams, Dr. Clydene PughWoodard  Dietary issues and exercise activities discussed: Current Exercise Habits: Home exercise routine, Type of exercise: walking;strength training/weights, Time (Minutes): 60, Frequency  (Times/Week): 4, Weekly Exercise (Minutes/Week): 240  Goals    . Patient Stated     12/23/2019, no goals      Depression Screen PHQ 2/9 Scores 12/23/2019 08/12/2019 08/01/2018 02/26/2018 09/21/2017 08/25/2017 03/07/2017  PHQ - 2 Score 0 4 3 2  0 2 3  PHQ- 9 Score 3 13 13 9 9 13 5     Fall Risk Fall Risk  12/23/2019 08/12/2019 08/01/2018 02/26/2018 12/19/2017  Falls in the past year? 0 0 0 0 0  Number falls in past yr: - 0 - 0 -  Injury with Fall? - 0 - 0 -  Risk Factor Category  - - - - -  Risk for fall due to : Medication side effect - - - -  Follow up Falls evaluation completed;Education provided;Falls prevention discussed - - - Falls evaluation completed  Comment - - - - -    Any stairs in or around the home? Yes  If so, are there any without handrails? No  Home free of loose throw rugs in walkways, pet beds, electrical cords, etc? Yes  Adequate lighting in your home to reduce risk of falls? Yes   ASSISTIVE DEVICES UTILIZED TO PREVENT FALLS:  Life alert? No  Use of a cane, walker or w/c? Yes  Grab bars in the bathroom? Yes  Shower chair or bench in shower? No  Elevated toilet seat or a handicapped toilet? No   TIMED UP AND GO:  Was the test performed? No .  Cognitive Function:     6CIT Screen 12/23/2019  What Year? 0 points  What month? 0 points  What time? 0 points  Count back from 20 0 points  Months in reverse 4 points  Repeat phrase 6 points  Total Score 10    Immunizations Immunization History  Administered Date(s) Administered  . Influenza,inj,Quad PF,6+ Mos 11/04/2014, 11/02/2015, 11/15/2016, 10/23/2017, 11/21/2018, 11/27/2019  . PFIZER SARS-COV-2 Vaccination 10/07/2019, 10/28/2019  . Td 04/23/2004  . Tdap 01/27/2016    TDAP status: Up to date Flu Vaccine status: Up to date Pneumococcal vaccine status: Up to date Covid-19 vaccine status: Completed vaccines  Qualifies for Shingles Vaccine? Yes   Zostavax completed Yes   Shingrix Completed?: No.     Education has been provided regarding the importance of this  vaccine. Patient has been advised to call insurance company to determine out of pocket expense if they have not yet received this vaccine. Advised may also receive vaccine at local pharmacy or Health Dept. Verbalized acceptance and understanding.  Screening Tests Health Maintenance  Topic Date Due  . Hepatitis C Screening  Never done  . HIV Screening  Never done  . PAP SMEAR-Modifier  01/28/2019  . MAMMOGRAM  10/30/2020  . TETANUS/TDAP  01/26/2026  . COLONOSCOPY  09/01/2027  . INFLUENZA VACCINE  Completed  . COVID-19 Vaccine  Completed    Health Maintenance  Health Maintenance Due  Topic Date Due  . Hepatitis C Screening  Never done  . HIV Screening  Never done  . PAP SMEAR-Modifier  01/28/2019    Colorectal cancer screening: Completed 08/31/2017. Repeat every 10 years Mammogram status: patient to schedule Bone Density status: Completed 06/17/2009.  Lung Cancer Screening: (Low Dose CT Chest recommended if Age 67-80 years, 30 pack-year currently smoking OR have quit w/in 15years.) does not qualify.   Lung Cancer Screening Referral: no  Additional Screening:  Hepatitis C Screening: does qualify; due  Vision Screening: Recommended annual ophthalmology exams for early detection of glaucoma and other disorders of the eye. Is the patient up to date with their annual eye exam?  No  Who is the provider or what is the name of the office in which the patient attends annual eye exams? Dr. Clydene Pugh If pt is not established with a provider, would they like to be referred to a provider to establish care? No .   Dental Screening: Recommended annual dental exams for proper oral hygiene  Community Resource Referral / Chronic Care Management: CRR required this visit?  No   CCM required this visit?  No      Plan:     I have personally reviewed and noted the following in the patient's chart:   . Medical and social  history . Use of alcohol, tobacco or illicit drugs  . Current medications and supplements . Functional ability and status . Nutritional status . Physical activity . Advanced directives . List of other physicians . Hospitalizations, surgeries, and ER visits in previous 12 months . Vitals . Screenings to include cognitive, depression, and falls . Referrals and appointments  In addition, I have reviewed and discussed with patient certain preventive protocols, quality metrics, and best practice recommendations. A written personalized care plan for preventive services as well as general preventive health recommendations were provided to patient.     Barb Merino, LPN   [id number]   Nurse Notes:

## 2019-12-24 DIAGNOSIS — M62830 Muscle spasm of back: Secondary | ICD-10-CM | POA: Diagnosis not present

## 2019-12-24 DIAGNOSIS — Z9181 History of falling: Secondary | ICD-10-CM | POA: Diagnosis not present

## 2019-12-24 DIAGNOSIS — M25572 Pain in left ankle and joints of left foot: Secondary | ICD-10-CM | POA: Diagnosis not present

## 2019-12-24 DIAGNOSIS — M47817 Spondylosis without myelopathy or radiculopathy, lumbosacral region: Secondary | ICD-10-CM | POA: Diagnosis not present

## 2019-12-24 DIAGNOSIS — Z79899 Other long term (current) drug therapy: Secondary | ICD-10-CM | POA: Diagnosis not present

## 2019-12-26 ENCOUNTER — Other Ambulatory Visit: Payer: Self-pay | Admitting: Family Medicine

## 2019-12-26 ENCOUNTER — Other Ambulatory Visit: Payer: Self-pay | Admitting: Nurse Practitioner

## 2019-12-26 ENCOUNTER — Telehealth: Payer: Self-pay

## 2019-12-26 DIAGNOSIS — Z1231 Encounter for screening mammogram for malignant neoplasm of breast: Secondary | ICD-10-CM

## 2019-12-26 MED ORDER — VITAMIN C 1000 MG PO TABS: 1000.0000 mg | ORAL_TABLET | Freq: Every day | ORAL | 4 refills | Status: AC

## 2019-12-26 MED ORDER — VITAMIN D3 125 MCG (5000 UT) PO CAPS: 5000.0000 [IU] | ORAL_CAPSULE | Freq: Every day | ORAL | 4 refills | Status: AC

## 2019-12-26 MED ORDER — ALBUTEROL SULFATE HFA 108 (90 BASE) MCG/ACT IN AERS: 2.0000 | INHALATION_SPRAY | Freq: Four times a day (QID) | RESPIRATORY_TRACT | 0 refills | Status: DC | PRN

## 2019-12-26 NOTE — Telephone Encounter (Signed)
Order in.

## 2019-12-26 NOTE — Telephone Encounter (Signed)
Copied from CRM [id number]. Topic: Referral - Request for Referral >> Dec 26, 2019 10:56 AM Gwenlyn Fudge wrote: Has patient seen PCP for this complaint? Yes.   *If NO, is insurance requiring patient see PCP for this issue before PCP can refer them? Referral for which specialty: Mammography  Preferred provider/office: [facility]  Reason for referral: Pt called stating that she is needing to have a mammogram. Please advise.

## 2019-12-26 NOTE — Telephone Encounter (Signed)
Medication Refill - Medication: Albuterol, Clonidine, Fluoxetine, hydroxyzine, Vitamin C, Vitamin D3, Vitamin b 12, levothyroxine, Seroquel XR, simvastatin  Has the patient contacted their pharmacy? Yes.   (Agent: If no, request that the patient contact the pharmacy for the refill.) (Agent: If yes, when and what did the pharmacy advise?)  Preferred Pharmacy (with phone number or street name):  [facility] - [place], [place] - [address] [place], [address]  [address] [place], [address] [place]  [address]  Phone: [phone] Fax: [phone]  Hours: Not open 24 hours     Agent: Please be advised that RX refills may take up to 3 business days. We ask that you follow-up with your pharmacy.

## 2019-12-26 NOTE — Telephone Encounter (Signed)
Requested medication (s) are due for refill today:  Yes  Requested medication (s) are on the active medication list:  Yes  Future visit scheduled:  Yes  Last Refill: unknown  Notes to clinic:  Historical provider  Requested Prescriptions  Pending Prescriptions Disp Refills   Cholecalciferol (VITAMIN D3) 125 MCG (5000 UT) CAPS 30 capsule     Sig: Take 1 capsule (5,000 Units total) by mouth daily.      Endocrinology:  Vitamins - Vitamin D Supplementation Failed - 12/26/2019 11:14 AM      Failed - 50,000 IU strengths are not delegated      Failed - Phosphate in normal range and within 360 days    No results found for: PHOS        Failed - Vitamin D in normal range and within 360 days    Vit D, 25-Hydroxy  Date Value Ref Range Status  01/27/2016 39.5 30.0 - 100.0 ng/mL Final    Comment:    Vitamin D deficiency has been defined by the Institute of Medicine and an Endocrine Society practice guideline as a level of serum 25-OH vitamin D less than 20 ng/mL (1,2). The Endocrine Society went on to further define vitamin D insufficiency as a level between 21 and 29 ng/mL (2). 1. IOM (Institute of Medicine). 2010. Dietary reference    intakes for calcium and D. [place]: The    [employer]. 2. Holick MF, Binkley [place], Bischoff-Ferrari HA, et al.    Evaluation, treatment, and prevention of vitamin D    deficiency: an Endocrine Society clinical practice    guideline. JCEM. 2011 Jul; 96(7):1911-30.           Passed - Ca in normal range and within 360 days    Calcium  Date Value Ref Range Status  07/15/2019 9.2 8.7 - 10.3 mg/dL Final          Passed - Valid encounter within last 12 months    Recent Outpatient Visits           4 months ago Chronic anxiety   [facility] [place], Rachel [place], [place]   5 months ago Chronic anxiety   [facility] [place], Rachel [place], [place]   1 year ago Hyperlipidemia, unspecified hyperlipidemia type    [facility] Particia Nearing[place], Rachel [place], [place]   1 year ago Primary insomnia   [facility] [place] He[place], [place]   1 year ago Primary insomnia   [facility] [place], Sal[place] He[place], [place]       Future Appointments             In 2 weeks Cannady, Dorie RankJolene T, NP [facility]   In 12 months  [facility]              Ascorbic Acid (VITAMIN C) 1000 MG tablet      Sig: Take 1 tablet (1,000 mg total) by mouth daily.      Endocrinology:  Vitamins Passed - 12/26/2019 11:14 AM      Passed - Valid encounter within last 12 months    Recent Outpatient Visits           4 months ago Chronic anxiety   [facility] [place], Rachel [place], [place]   5 months ago Chronic anxiety   [facility] [place], Rachel [place], [place]   1 year ago Hyperlipidemia, unspecified hyperlipidemia type   [facility] [place], Rachel [place], [place]  1 year ago Primary insomnia   [facility] Roosvelt Maser [place], [place]   1 year ago Primary insomnia   [facility] Ro[place]velt Maser [place], [place]       Future Appointments             In 2 weeks Cannady, Dorie Rank, NP [employer], [facility]   In 12 months  [employer], [facility]             Signed Prescriptions Disp Refills   albuterol (VENTOLIN HFA) 108 (90 Base) MCG/ACT inhaler 54 g 0    Sig: Inhale 2 puffs into the lungs every 6 (six) hours as needed for wheezing or shortness of breath.      Pulmonology:  Beta Agonists Failed - 12/26/2019 11:14 AM      Failed - One inhaler should last at least one month. If the patient is requesting refills earlier, contact the patient to check for uncontrolled symptoms.      Passed - Valid encounter within last 12 months    Recent Outpatient Visits           4 months ago Chronic anxiety   [facility] Roosvelt Maser [place], [place]   5 months ago Chronic anxiety    [facility] Roosvelt Maser [place], [place]   1 year ago Hyperlipidemia, unspecified hyperlipidemia type   [facility] Particia [place]earing, [place]   1 year ago Primary insomnia   [facility] Roosvelt Maser [place], [place]   1 year ago Primary insomnia   [facility] [place], Salley Hews, [place]       Future Appointments             In 2 weeks Cannady, Dorie Rank, NP [employer], [facility]   In 12 months  [employer] [facility], [facility]             Refused Prescriptions Disp Refills   cloNIDine (CATAPRES) 0.2 MG tablet 360 tablet 1    Sig: Take 1 tablet (0.2 mg total) by mouth 3 (three) times daily.      Cardiovascular:  Alpha-2 Agonists Passed - 12/26/2019 11:14 AM      Passed - Last BP in normal range    BP Readings from Last 1 Encounters:  11/11/19 122/65          Passed - Last Heart Rate in normal range    Pulse Readings from Last 1 Encounters:  11/11/19 71          Passed - Valid encounter within last 6 months    Recent Outpatient Visits           4 months ago Chronic anxiety   [facility] Roosvelt Maser [place], [place]   5 months ago Chronic anxiety   [facility] [place] Maser [place], [place]   1 year ago Hyperlipidemia, unspecified hyperlipidemia type   [facility] Particia [place], [place]   1 year ago Primary insomnia   [facility] Particia [place]earing, [place]   1 year ago Primary insomnia   [facility] [place], Salley Hews, [place]       Future Appointments             In 2 weeks Cannady, Corrie Dandy T, NP [employer], [facility]   In 12 months  [facility]              FLUoxetine (PROZAC) 20 MG capsule 90 capsule  4    Sig: Take 1 capsule (20 mg total) by mouth daily. Take alongside the 40 mg prozac to total 60 mg      Psychiatry:  Antidepressants - SSRI Passed - 12/26/2019 11:14 AM      Passed -  Completed PHQ-2 or PHQ-9 in the last 360 days      Passed - Valid encounter within last 6 months    Recent Outpatient Visits           4 months ago Chronic anxiety   [facility] Roosvelt Maser [place], [place]   5 months ago Chronic anxiety   [facility] Roosvelt Maser [place], [place]   1 year ago Hyperlipidemia, unspecified hyperlipidemia type   [facility] Particia [place], [place]   1 year ago Primary insomnia   [facility] [place], Salley Hews, [place]   1 year ago Primary insomnia   [facility] [place], Salley Hews, [place]       Future Appointments             In 2 weeks Cannady, Dorie Rank, NP [employer], [facility]   In 12 months  [facility]              FLUoxetine (PROZAC) 40 MG capsule 90 capsule 1    Sig: Take 1 capsule (40 mg total) by mouth daily.      Psychiatry:  Antidepressants - SSRI Passed - 12/26/2019 11:14 AM      Passed - Completed PHQ-2 or PHQ-9 in the last 360 days      Passed - Valid encounter within last 6 months    Recent Outpatient Visits           4 months ago Chronic anxiety   [facility] Ro[place]velt Maser [place], [place]   5 months ago Chronic anxiety   [facility] Roosvelt Maser [place], [place]   1 year ago Hyperlipidemia, unspecified hyperlipidemia type   [facility] Particia [place]earing, [place]   1 year ago Primary insomnia   [facility] [place], Salley Hews, [place]   1 year ago Primary insomnia   [facility] [place], Salley Hews, [place]       Future Appointments             In 2 weeks Cannady, Dorie Rank, NP [employer], [facility]   In 12 months  [facility]              hydrOXYzine (ATARAX/VISTARIL) 25 MG tablet 90 tablet 1    Sig: Take 1 tablet (25 mg total) by mouth at bedtime as needed.      Ear, Nose, and Throat:  Antihistamines Passed - 12/26/2019  11:14 AM      Passed - Valid encounter within last 12 months    Recent Outpatient Visits           4 months ago Chronic anxiety   [facility] Roosvelt Maser [place], [place]   5 months ago Chronic anxiety   [facility] Roosvelt Maser [place], [place]   1 year ago Hyperlipidemia, unspecified hyperlipidemia type   [facility], Salley Hews, [place]   1 year ago Primary insomnia   [facility] [place], Salley Hews, [place]   1 year ago Primary insomnia   [facility] [place], Salley Hews, [place]       Future Appointments  In 2 weeks Cannady, Dorie Rank, NP [employer], [facility]   In 12 months  [employer] [facility]              levothyroxine (SYNTHROID) 50 MCG tablet 90 tablet 1    Sig: Take 1 tablet (50 mcg total) by mouth daily before breakfast.      Endocrinology:  Hypothyroid Agents Failed - 12/26/2019 11:14 AM      Failed - TSH needs to be rechecked within 3 months after an abnormal result. Refill until TSH is due.      Passed - TSH in normal range and within 360 days    TSH  Date Value Ref Range Status  07/15/2019 2.590 0.450 - 4.500 uIU/mL Final          Passed - Valid encounter within last 12 months    Recent Outpatient Visits           4 months ago Chronic anxiety   [facility] Roosvelt Maser [place], [place]   5 months ago Chronic anxiety   [facility] Roosvelt Maser [place], [place]   1 year ago Hyperlipidemia, unspecified hyperlipidemia type   [facility] Particia Nearing, [place]   1 year ago Primary insomnia   [facility] Roosvelt Maser [place], [place]   1 year ago Primary insomnia   [facility] [place], Salley Hews, [place]       Future Appointments             In 2 weeks Cannady, Dorie Rank, NP [employer], [facility]   In 12 months  [employer]              QUEtiapine  Fumarate (SEROQUEL XR) 150 MG 24 hr tablet 90 tablet 1    Sig: Take 1 tablet (150 mg total) by mouth at bedtime.      Not Delegated - Psychiatry:  Antipsychotics - Second Generation (Atypical) - quetiapine Failed - 12/26/2019 11:14 AM      Failed - This refill cannot be delegated      Passed - ALT in normal range and within 180 days    ALT  Date Value Ref Range Status  07/15/2019 23 0 - 32 IU/L Final          Passed - AST in normal range and within 180 days    AST  Date Value Ref Range Status  07/15/2019 24 0 - 40 IU/L Final          Passed - Completed PHQ-2 or PHQ-9 in the last 360 days      Passed - Last BP in normal range    BP Readings from Last 1 Encounters:  11/11/19 122/65          Passed - Valid encounter within last 6 months    Recent Outpatient Visits           4 months ago Chronic anxiety   [facility] Roosvelt Maser [place], [place]   5 months ago Chronic anxiety   [facility] Roosvelt Maser [place], [place]   1 year ago Hyperlipidemia, unspecified hyperlipidemia type   [facility], Salley Hews, [place]   1 year ago Primary insomnia   [facility] [place], Salley Hews, [place]   1 year ago Primary insomnia   [facility] [place], Salley Hews, [place]       Future Appointments  In 2 weeks Cannady, Dorie Rank, NP [employer], [facility]   In 12 months  [employer]              simvastatin (ZOCOR) 40 MG tablet 90 tablet 1    Sig: Take 1 tablet (40 mg total) by mouth daily.      Cardiovascular:  Antilipid - Statins Failed - 12/26/2019 11:14 AM      Failed - LDL in normal range and within 360 days    LDL Chol Calc (NIH)  Date Value Ref Range Status  07/15/2019 93 0 - 99 mg/dL Final          Failed - Triglycerides in normal range and within 360 days    Triglycerides  Date Value Ref Range Status  07/15/2019 163 (H) 0 - 149 mg/dL Final           Passed - Total Cholesterol in normal range and within 360 days    Cholesterol, Total  Date Value Ref Range Status  07/15/2019 165 100 - 199 mg/dL Final          Passed - HDL in normal range and within 360 days    HDL  Date Value Ref Range Status  07/15/2019 44 >39 mg/dL Final          Passed - Patient is not pregnant      Passed - Valid encounter within last 12 months    Recent Outpatient Visits           4 months ago Chronic anxiety   [facility] Roosvelt Maser [place], [place]   5 months ago Chronic anxiety   [facility] Roosvelt Maser [place], [place]   1 year ago Hyperlipidemia, unspecified hyperlipidemia type   [facility], Salley Hews, [place]   1 year ago Primary insomnia   [facility] [place], Salley Hews, [place]   1 year ago Primary insomnia   [facility] [place], Salley Hews, [place]       Future Appointments             In 2 weeks Cannady, Dorie Rank, NP [employer] [facility], [facility]   In 12 months  [facility] [facility]

## 2019-12-26 NOTE — Telephone Encounter (Signed)
Requested Prescriptions  Pending Prescriptions Disp Refills  . albuterol (VENTOLIN HFA) 108 (90 Base) MCG/ACT inhaler 54 g 0    Sig: Inhale 2 puffs into the lungs every 6 (six) hours as needed for wheezing or shortness of breath.     Pulmonology:  Beta Agonists Failed - 12/26/2019 11:14 AM      Failed - One inhaler should last at least one month. If the patient is requesting refills earlier, contact the patient to check for uncontrolled symptoms.      Passed - Valid encounter within last 12 months    Recent Outpatient Visits          4 months ago Chronic anxiety   [facility] Roosvelt Maser [place], [place]   5 months ago Chronic anxiety   [facility] Roosvelt Maser [place], [place]   1 year ago Hyperlipidemia, unspecified hyperlipidemia type   [facility] Particia [place], [place]   1 year ago Primary insomnia   [facility] Roosvelt Maser [place], [place]   1 year ago Primary insomnia   [facility], Salley Hews, [place]      Future Appointments            In 2 weeks Cannady, Dorie Rank, NP [facility]   In 12 months  [facility]           . Cholecalciferol (VITAMIN D3) 125 MCG (5000 UT) CAPS 30 capsule     Sig: Take 1 capsule (5,000 Units total) by mouth daily.     Endocrinology:  Vitamins - Vitamin D Supplementation Failed - 12/26/2019 11:14 AM      Failed - 50,000 IU strengths are not delegated      Failed - Phosphate in normal range and within 360 days    No results found for: PHOS       Failed - Vitamin D in normal range and within 360 days    Vit D, 25-Hydroxy  Date Value Ref Range Status  01/27/2016 39.5 30.0 - 100.0 ng/mL Final    Comment:    Vitamin D deficiency has been defined by the Institute of Medicine and an Endocrine Society practice guideline as a level of serum 25-OH vitamin D less than 20 ng/mL (1,2). The Endocrine Society went on to further define  vitamin D insufficiency as a level between 21 and 29 ng/mL (2). 1. IOM (Institute of Medicine). 2010. Dietary reference    intakes for calcium and D. [place]: The    [employer]. 2. Holick MF, Binkley Swayzee, Bischoff-Ferrari HA, et al.    Evaluation, treatment, and prevention of vitamin D    deficiency: an Endocrine Society clinical practice    guideline. JCEM. 2011 Jul; 96(7):1911-30.          Passed - Ca in normal range and within 360 days    Calcium  Date Value Ref Range Status  07/15/2019 9.2 8.7 - 10.3 mg/dL Final         Passed - Valid encounter within last 12 months    Recent Outpatient Visits          4 months ago Chronic anxiety   [facility] Roosvelt Maser [place], [place]   5 months ago Chronic anxiety   [facility] Roosvelt Maser [place], [place]   1 year ago Hyperlipidemia, unspecified hyperlipidemia type   [facility] Particia Nearing, [place]   1 year ago Primary insomnia  [facility], Salley Hews, [place]   1 year ago Primary insomnia   [facility] [place], Salley Hews, [place]      Future Appointments            In 2 weeks Cannady, Dorie Rank, NP [employer] [facility]   In 12 months  [employer] [facility]           . Ascorbic Acid (VITAMIN C) 1000 MG tablet      Sig: Take 1 tablet (1,000 mg total) by mouth daily.     Endocrinology:  Vitamins Passed - 12/26/2019 11:14 AM      Passed - Valid encounter within last 12 months    Recent Outpatient Visits          4 months ago Chronic anxiety   [facility] Roosvelt Maser [place], [place]   5 months ago Chronic anxiety   [facility] Roosvelt Maser [place], [place]   1 year ago Hyperlipidemia, unspecified hyperlipidemia type   [facility] Particia Nearing, [place]   1 year ago Primary insomnia   [facility] Particia [place], [place]   1 year ago Primary  insomnia   [facility] [place], Salley Hews, [place]      Future Appointments            In 2 weeks Cannady, Dorie Rank, NP [employer] [facility]   In 12 months  [employer] [facility]           Refused Prescriptions Disp Refills  . cloNIDine (CATAPRES) 0.2 MG tablet 360 tablet 1    Sig: Take 1 tablet (0.2 mg total) by mouth 3 (three) times daily.     Cardiovascular:  Alpha-2 Agonists Passed - 12/26/2019 11:14 AM      Passed - Last BP in normal range    BP Readings from Last 1 Encounters:  11/11/19 122/65         Passed - Last Heart Rate in normal range    Pulse Readings from Last 1 Encounters:  11/11/19 71         Passed - Valid encounter within last 6 months    Recent Outpatient Visits          4 months ago Chronic anxiety   [facility] Roosvelt Maser [place], [place]   5 months ago Chronic anxiety   [facility] Roosvelt Maser [place], [place]   1 year ago Hyperlipidemia, unspecified hyperlipidemia type   [facility], Salley Hews, [place]   1 year ago Primary insomnia   [facility] Particia Nearing, [place]   1 year ago Primary insomnia   [facility] [place], Salley Hews, [place]      Future Appointments            In 2 weeks Cannady, Dorie Rank, NP [employer] [facility]   In 12 months  [employer] [facility]           . FLUoxetine (PROZAC) 20 MG capsule 90 capsule 4    Sig: Take 1 capsule (20 mg total) by mouth daily. Take alongside the 40 mg prozac to total 60 mg     Psychiatry:  Antidepressants - SSRI Passed - 12/26/2019 11:14 AM      Passed - Completed PHQ-2 or PHQ-9 in the last 360 days      Passed - Valid encounter within last 6 months    Recent Outpatient  Visits          4 months ago Chronic anxiety   [facility] Roosvelt Maser [place], [place]   5 months ago Chronic anxiety   [facility] Roosvelt Maser [place], [place]    1 year ago Hyperlipidemia, unspecified hyperlipidemia type   [facility] Particia Nearing, [place]   1 year ago Primary insomnia   [facility] Roosvelt Maser [place], [place]   1 year ago Primary insomnia   [facility] [place], Salley Hews, [place]      Future Appointments            In 2 weeks Cannady, Dorie Rank, NP [employer]   In 12 months  [employer] [facility]           . FLUoxetine (PROZAC) 40 MG capsule 90 capsule 1    Sig: Take 1 capsule (40 mg total) by mouth daily.     Psychiatry:  Antidepressants - SSRI Passed - 12/26/2019 11:14 AM      Passed - Completed PHQ-2 or PHQ-9 in the last 360 days      Passed - Valid encounter within last 6 months    Recent Outpatient Visits          4 months ago Chronic anxiety   [facility] Roosvelt Maser [place], [place]   5 months ago Chronic anxiety   [facility] Roosvelt Maser [place], [place]   1 year ago Hyperlipidemia, unspecified hyperlipidemia type   [facility] Particia Nearing, [place]   1 year ago Primary insomnia   [facility] [place], Salley Hews, [place]   1 year ago Primary insomnia   [facility] [place], Salley Hews, [place]      Future Appointments            In 2 weeks Cannady, Dorie Rank, NP [employer]   In 12 months  [employer]           . hydrOXYzine (ATARAX/VISTARIL) 25 MG tablet 90 tablet 1    Sig: Take 1 tablet (25 mg total) by mouth at bedtime as needed.     Ear, Nose, and Throat:  Antihistamines Passed - 12/26/2019 11:14 AM      Passed - Valid encounter within last 12 months    Recent Outpatient Visits          4 months ago Chronic anxiety   [facility] Roosvelt Maser [place], [place]   5 months ago Chronic anxiety   [facility] Roosvelt Maser [place], [place]   1 year ago Hyperlipidemia, unspecified  hyperlipidemia type   [facility], Salley Hews, [place]   1 year ago Primary insomnia   [facility] [place], Salley Hews, [place]   1 year ago Primary insomnia   [facility] [place], Salley Hews, [place]      Future Appointments            In 2 weeks Cannady, Dorie Rank, NP [employer] [facility]   In 12 months  [employer]           . levothyroxine (SYNTHROID) 50 MCG tablet 90 tablet 1    Sig: Take 1 tablet (50 mcg total) by mouth daily before breakfast.     Endocrinology:  Hypothyroid Agents Failed - 12/26/2019 11:14 AM      Failed - TSH needs to be rechecked within 3 months after  an abnormal result. Refill until TSH is due.      Passed - TSH in normal range and within 360 days    TSH  Date Value Ref Range Status  07/15/2019 2.590 0.450 - 4.500 uIU/mL Final         Passed - Valid encounter within last 12 months    Recent Outpatient Visits          4 months ago Chronic anxiety   [facility] Roosvelt Maser [place], [place]   5 months ago Chronic anxiety   [facility] Roosvelt Maser [place], [place]   1 year ago Hyperlipidemia, unspecified hyperlipidemia type   [facility], Salley Hews, [place]   1 year ago Primary insomnia   [facility] Particia Nearing, [place]   1 year ago Primary insomnia   [facility] [place], Salley Hews, [place]      Future Appointments            In 2 weeks Cannady, Dorie Rank, NP [employer]   In 12 months  [employer]           . QUEtiapine Fumarate (SEROQUEL XR) 150 MG 24 hr tablet 90 tablet 1    Sig: Take 1 tablet (150 mg total) by mouth at bedtime.     Not Delegated - Psychiatry:  Antipsychotics - Second Generation (Atypical) - quetiapine Failed - 12/26/2019 11:14 AM      Failed - This refill cannot be delegated      Passed - ALT in normal range and within 180  days    ALT  Date Value Ref Range Status  07/15/2019 23 0 - 32 IU/L Final         Passed - AST in normal range and within 180 days    AST  Date Value Ref Range Status  07/15/2019 24 0 - 40 IU/L Final         Passed - Completed PHQ-2 or PHQ-9 in the last 360 days      Passed - Last BP in normal range    BP Readings from Last 1 Encounters:  11/11/19 122/65         Passed - Valid encounter within last 6 months    Recent Outpatient Visits          4 months ago Chronic anxiety   [facility] Roosvelt Maser [place], [place]   5 months ago Chronic anxiety   [facility] Roosvelt Maser [place], [place]   1 year ago Hyperlipidemia, unspecified hyperlipidemia type   [facility], Salley Hews, [place]   1 year ago Primary insomnia   [facility] [place], Salley Hews, [place]   1 year ago Primary insomnia   [facility] [place], Salley Hews, [place]      Future Appointments            In 2 weeks Cannady, Dorie Rank, NP [employer] [facility]   In 12 months  [employer]           . simvastatin (ZOCOR) 40 MG tablet 90 tablet 1    Sig: Take 1 tablet (40 mg total) by mouth daily.     Cardiovascular:  Antilipid - Statins Failed - 12/26/2019 11:14 AM      Failed - LDL in normal range and within 360 days    LDL Chol Calc (NIH)  Date Value Ref Range Status  07/15/2019  93 0 - 99 mg/dL Final         Failed - Triglycerides in normal range and within 360 days    Triglycerides  Date Value Ref Range Status  07/15/2019 163 (H) 0 - 149 mg/dL Final         Passed - Total Cholesterol in normal range and within 360 days    Cholesterol, Total  Date Value Ref Range Status  07/15/2019 165 100 - 199 mg/dL Final         Passed - HDL in normal range and within 360 days    HDL  Date Value Ref Range Status  07/15/2019 44 >39 mg/dL Final         Passed - Patient is not pregnant      Passed - Valid  encounter within last 12 months    Recent Outpatient Visits          4 months ago Chronic anxiety   [facility] [place]   5 months ago Chronic anxiety   [facility] [place], [place]   1 year ago Hyperlipidemia, unspecified hyperlipidemia type   [facility], [place], [place]   1 year ago Primary insomnia   [facility] [place], [place], [place]   1 year ago Primary insomnia   [facility] [place] [place]      Future Appointments            In 2 weeks Cannady, Dorie RankJolene T, NP [facility]   In 12 months  [facility]

## 2019-12-26 NOTE — Telephone Encounter (Signed)
Patient is requesting prescription vitamin d

## 2020-01-10 ENCOUNTER — Encounter: Payer: Self-pay | Admitting: Nurse Practitioner

## 2020-01-10 DIAGNOSIS — N183 Chronic kidney disease, stage 3 unspecified: Secondary | ICD-10-CM | POA: Insufficient documentation

## 2020-01-15 ENCOUNTER — Encounter: Payer: Self-pay | Admitting: Nurse Practitioner

## 2020-01-15 ENCOUNTER — Ambulatory Visit: Payer: Medicare Other | Admitting: Family Medicine

## 2020-01-15 ENCOUNTER — Ambulatory Visit (INDEPENDENT_AMBULATORY_CARE_PROVIDER_SITE_OTHER): Payer: Medicare Other | Admitting: Nurse Practitioner

## 2020-01-15 ENCOUNTER — Other Ambulatory Visit: Payer: Self-pay

## 2020-01-15 VITALS — BP 95/70 | HR 69 | Temp 98.1°F | Ht 65.47 in | Wt 229.4 lb

## 2020-01-15 DIAGNOSIS — F411 Generalized anxiety disorder: Secondary | ICD-10-CM | POA: Diagnosis not present

## 2020-01-15 DIAGNOSIS — F322 Major depressive disorder, single episode, severe without psychotic features: Secondary | ICD-10-CM | POA: Diagnosis not present

## 2020-01-15 DIAGNOSIS — E669 Obesity, unspecified: Secondary | ICD-10-CM

## 2020-01-15 MED ORDER — QUETIAPINE FUMARATE ER 150 MG PO TB24: 150.0000 mg | ORAL_TABLET | Freq: Every day | ORAL | 4 refills | Status: DC

## 2020-01-15 NOTE — Assessment & Plan Note (Signed)
Chronic, stable.  Denies SI/HI.  Continue current medication regimen and adjust as needed.  Refills sent in.  Would avoid benzo due to opioid use and past addiction history.  Plan for return in 6 months to meet new PCP.

## 2020-01-15 NOTE — Assessment & Plan Note (Signed)
Chronic, stable.  Denies SI/HI.  Continue current medication regimen and adjust as needed.  Refills sent in.  Plan for return in 6 months to meet new PCP.

## 2020-01-15 NOTE — Assessment & Plan Note (Signed)
Recommended eating smaller high protein, low fat meals more frequently and exercising 30 mins a day 5 times a week with a goal of 10-15lb weight loss in the next 3 months. Patient voiced their understanding and motivation to adhere to these recommendations.  

## 2020-01-15 NOTE — Patient Instructions (Signed)

## 2020-01-15 NOTE — Progress Notes (Signed)
BP 95/70   Pulse 69   Temp 98.1 F (36.7 C) (Oral)   Ht 5' 5.47" (1.663 m)   Wt 229 lb 6.4 oz (104.1 kg)   SpO2 97%   BMI 37.63 kg/m    Subjective:    Patient ID: Kayla Wright, female    DOB: 12/25/1955, 64 y.o.   MRN: [id number]  HPI: Kayla Wright is a 64 y.o. female  Chief Complaint  Patient presents with  . Anxiety  . Depression   DEPRESSION Continues on Prozac 20 MG daily, Seroquel 150 MG QHS, and Vistaril as needed at bedtime.  Reports stable mood with this regimen. Mood status: stable Satisfied with current treatment?: yes Symptom severity: moderate  Duration of current treatment : chronic Side effects: no Medication compliance: good compliance Psychotherapy/counseling: none Previous psychiatric medications: nothing Anxious mood: sometimes Anhedonia: no Significant weight loss or gain: no Insomnia: none Fatigue: no Feelings of worthlessness or guilt: no Impaired concentration/indecisiveness: sometimes Suicidal ideations: no Hopelessness: no Crying spells: no Depression screen [facility] 2/9 01/15/2020 12/23/2019 08/12/2019 08/01/2018 02/26/2018  Decreased Interest 2 0 2 1 2   Down, Depressed, Hopeless 2 0 2 2 0  PHQ - 2 Score 4 0 4 3 2   Altered sleeping 1 0 1 2 3   Tired, decreased energy 2 0 2 2 3   Change in appetite 1 0 2 3 0  Feeling bad or failure about yourself  1 1 2 1 1   Trouble concentrating 2 2 1 2  0  Moving slowly or fidgety/restless 0 0 1 0 0  Suicidal thoughts 0 0 0 0 0  PHQ-9 Score 11 3 13 13 9   Difficult doing work/chores Not difficult at all Not difficult at all - - Not difficult at all  Some recent data might be hidden   GAD 7 : Generalized Anxiety Score 01/15/2020 08/12/2019 09/21/2017 08/25/2017  Nervous, Anxious, on Edge 1 2 2 1   Control/stop worrying 2 2 1  0  Worry too much - different things 1 3 2 2   Trouble relaxing 2 2 2 2   Restless 1 2 1 2   Easily annoyed or irritable 1 3 1 3   Afraid - awful might happen 2 2 1 2   Total GAD 7 Score 10 16 10  12   Anxiety Difficulty Somewhat difficult Somewhat difficult - -  Some encounter information is confidential and restricted. Go to Review Flowsheets activity to see all data.     Relevant past medical, surgical, family and social history reviewed and updated as indicated. Interim medical history since our last visit reviewed. Allergies and medications reviewed and updated.  Review of Systems  Constitutional: Negative for activity change, appetite change, diaphoresis, fatigue and fever.  Respiratory: Negative for cough, chest tightness and shortness of breath.   Cardiovascular: Negative for chest pain, palpitations and leg swelling.  Gastrointestinal: Negative.   Endocrine: Negative for cold intolerance, heat intolerance, polydipsia, polyphagia and polyuria.  Neurological: Negative.   Psychiatric/Behavioral: Negative.     Per HPI unless specifically indicated above     Objective:    BP 95/70   Pulse 69   Temp 98.1 F (36.7 C) (Oral)   Ht 5' 5.47" (1.663 m)   Wt 229 lb 6.4 oz (104.1 kg)   SpO2 97%   BMI 37.63 kg/m   Wt Readings from Last 3 Encounters:  01/15/20 229 lb 6.4 oz (104.1 kg)  12/23/19 210 lb (95.3 kg)  11/11/19 221 lb (100.2 kg)    Physical Exam Vitals  and nursing note reviewed.  Constitutional:      General: She is awake. She is not in acute distress.    Appearance: She is well-developed and well-groomed. She is obese. She is not ill-appearing.  HENT:     Head: Normocephalic.     Right Ear: Hearing normal.     Left Ear: Hearing normal.     Nose: Nose normal.  Eyes:     General: Lids are normal.        Right eye: No discharge.        Left eye: No discharge.     Conjunctiva/sclera: Conjunctivae normal.     Pupils: Pupils are equal, round, and reactive to light.  Neck:     Thyroid: No thyromegaly.     Vascular: No carotid bruit.  Cardiovascular:     Rate and Rhythm: Normal rate and regular rhythm.     Heart sounds: Normal heart sounds. No murmur  heard. No gallop.   Pulmonary:     Effort: Pulmonary effort is normal. No accessory muscle usage or respiratory distress.     Breath sounds: Normal breath sounds.  Abdominal:     General: Bowel sounds are normal.     Palpations: Abdomen is soft.  Musculoskeletal:     Cervical back: Normal range of motion and neck supple.     Right lower leg: No edema.     Left lower leg: No edema.  Skin:    General: Skin is warm and dry.  Neurological:     Mental Status: She is alert and oriented to person, place, and time.  Psychiatric:        Attention and Perception: Attention normal.        Mood and Affect: Mood normal.        Speech: Speech normal.        Behavior: Behavior normal. Behavior is cooperative.        Thought Content: Thought content normal.     Results for orders placed or performed in visit on 07/15/19  Comprehensive metabolic panel  Result Value Ref Range   Glucose 86 65 - 99 mg/dL   BUN 11 8 - 27 mg/dL   Creatinine, Ser 9.03 (H) 0.57 - 1.00 mg/dL   GFR calc non Af Amer 51 (L) >59 mL/min/1.73   GFR calc Af Amer 59 (L) >59 mL/min/1.73   BUN/Creatinine Ratio 10 (L) 12 - 28   Sodium 139 134 - 144 mmol/L   Potassium 4.3 3.5 - 5.2 mmol/L   Chloride 102 96 - 106 mmol/L   CO2 23 20 - 29 mmol/L   Calcium 9.2 8.7 - 10.3 mg/dL   Total Protein 7.2 6.0 - 8.5 g/dL   Albumin 4.6 3.8 - 4.8 g/dL   Globulin, Total 2.6 1.5 - 4.5 g/dL   Albumin/Globulin Ratio 1.8 1.2 - 2.2   Bilirubin Total 0.9 0.0 - 1.2 mg/dL   Alkaline Phosphatase 97 48 - 121 IU/L   AST 24 0 - 40 IU/L   ALT 23 0 - 32 IU/L  Lipid Panel w/o Chol/HDL Ratio  Result Value Ref Range   Cholesterol, Total 165 100 - 199 mg/dL   Triglycerides 009 (H) 0 - 149 mg/dL   HDL 44 >23 mg/dL   VLDL Cholesterol Cal 28 5 - 40 mg/dL   LDL Chol Calc (NIH) 93 0 - 99 mg/dL  TSH  Result Value Ref Range   TSH 2.590 0.450 - 4.500 uIU/mL  HgB A1c  Result Value  Ref Range   Hgb A1c MFr Bld 5.9 (H) 4.8 - 5.6 %   Est. average glucose  Bld gHb Est-mCnc 123 mg/dL      Assessment & Plan:   Problem List Items Addressed This Visit      Other   Severe depression (HCC) - Primary    Chronic, stable.  Denies SI/HI.  Continue current medication regimen and adjust as needed.  Refills sent in.  Plan for return in 6 months to meet new PCP.      Obesity (BMI 30-39.9)    Recommended eating smaller high protein, low fat meals more frequently and exercising 30 mins a day 5 times a week with a goal of 10-15lb weight loss in the next 3 months. Patient voiced their understanding and motivation to adhere to these recommendations.       Anxiety disorder    Chronic, stable.  Denies SI/HI.  Continue current medication regimen and adjust as needed.  Refills sent in.  Would avoid benzo due to opioid use and past addiction history.  Plan for return in 6 months to meet new PCP.          Follow up plan: Return in about 6 months (around 07/15/2020) for MOOD and COPD -- would benefit from spirometry -- meet new PCP.

## 2020-01-23 DIAGNOSIS — M62830 Muscle spasm of back: Secondary | ICD-10-CM | POA: Diagnosis not present

## 2020-01-23 DIAGNOSIS — M47817 Spondylosis without myelopathy or radiculopathy, lumbosacral region: Secondary | ICD-10-CM | POA: Diagnosis not present

## 2020-01-23 DIAGNOSIS — Z79899 Other long term (current) drug therapy: Secondary | ICD-10-CM | POA: Diagnosis not present

## 2020-01-23 DIAGNOSIS — M25572 Pain in left ankle and joints of left foot: Secondary | ICD-10-CM | POA: Diagnosis not present

## 2020-02-21 DIAGNOSIS — M47817 Spondylosis without myelopathy or radiculopathy, lumbosacral region: Secondary | ICD-10-CM | POA: Diagnosis not present

## 2020-02-21 DIAGNOSIS — Z79899 Other long term (current) drug therapy: Secondary | ICD-10-CM | POA: Diagnosis not present

## 2020-02-21 DIAGNOSIS — Z9181 History of falling: Secondary | ICD-10-CM | POA: Diagnosis not present

## 2020-02-21 DIAGNOSIS — M62830 Muscle spasm of back: Secondary | ICD-10-CM | POA: Diagnosis not present

## 2020-02-21 DIAGNOSIS — M25572 Pain in left ankle and joints of left foot: Secondary | ICD-10-CM | POA: Diagnosis not present

## 2020-03-23 DIAGNOSIS — Z9181 History of falling: Secondary | ICD-10-CM | POA: Diagnosis not present

## 2020-03-23 DIAGNOSIS — M47817 Spondylosis without myelopathy or radiculopathy, lumbosacral region: Secondary | ICD-10-CM | POA: Diagnosis not present

## 2020-03-23 DIAGNOSIS — J449 Chronic obstructive pulmonary disease, unspecified: Secondary | ICD-10-CM | POA: Diagnosis not present

## 2020-03-23 DIAGNOSIS — M62830 Muscle spasm of back: Secondary | ICD-10-CM | POA: Diagnosis not present

## 2020-03-23 DIAGNOSIS — Z79899 Other long term (current) drug therapy: Secondary | ICD-10-CM | POA: Diagnosis not present

## 2020-03-23 DIAGNOSIS — M25572 Pain in left ankle and joints of left foot: Secondary | ICD-10-CM | POA: Diagnosis not present

## 2020-04-08 ENCOUNTER — Other Ambulatory Visit: Payer: Self-pay | Admitting: Nurse Practitioner

## 2020-04-08 NOTE — Telephone Encounter (Signed)
Courtesy refill, patient needs an office visit. Requested Prescriptions  Pending Prescriptions Disp Refills  . simvastatin (ZOCOR) 40 MG tablet [Pharmacy Med Name: Simvastatin 40 MG Oral Tablet] 30 tablet 0    Sig: TAKE 1 TABLET BY MOUTH  DAILY     Cardiovascular:  Antilipid - Statins Failed - 04/08/2020 12:46 AM      Failed - LDL in normal range and within 360 days    LDL Chol Calc (NIH)  Date Value Ref Range Status  07/15/2019 93 0 - 99 mg/dL Final         Failed - Triglycerides in normal range and within 360 days    Triglycerides  Date Value Ref Range Status  07/15/2019 163 (H) 0 - 149 mg/dL Final         Passed - Total Cholesterol in normal range and within 360 days    Cholesterol, Total  Date Value Ref Range Status  07/15/2019 165 100 - 199 mg/dL Final         Passed - HDL in normal range and within 360 days    HDL  Date Value Ref Range Status  07/15/2019 44 >39 mg/dL Final         Passed - Patient is not pregnant      Passed - Valid encounter within last 12 months    Recent Outpatient Visits          2 months ago Severe depression (HCC)   [facility] [place], Corrie Dandy T, NP   8 months ago Chronic anxiety   [facility] Particia [place], [place]   8 months ago Chronic anxiety   [facility] [place], [place], [place]   1 year ago Hyperlipidemia, unspecified hyperlipidemia type   [facility], Salley Hews, [place]   1 year ago Primary insomnia   [facility] [place], Salley Hews, [place]      Future Appointments            In 8 months [facility]

## 2020-04-21 DIAGNOSIS — J449 Chronic obstructive pulmonary disease, unspecified: Secondary | ICD-10-CM | POA: Diagnosis not present

## 2020-04-21 DIAGNOSIS — M25572 Pain in left ankle and joints of left foot: Secondary | ICD-10-CM | POA: Diagnosis not present

## 2020-04-21 DIAGNOSIS — E559 Vitamin D deficiency, unspecified: Secondary | ICD-10-CM | POA: Diagnosis not present

## 2020-04-21 DIAGNOSIS — Z79899 Other long term (current) drug therapy: Secondary | ICD-10-CM | POA: Diagnosis not present

## 2020-04-21 DIAGNOSIS — Z9181 History of falling: Secondary | ICD-10-CM | POA: Diagnosis not present

## 2020-04-21 DIAGNOSIS — M47817 Spondylosis without myelopathy or radiculopathy, lumbosacral region: Secondary | ICD-10-CM | POA: Diagnosis not present

## 2020-04-21 DIAGNOSIS — M129 Arthropathy, unspecified: Secondary | ICD-10-CM | POA: Diagnosis not present

## 2020-04-21 DIAGNOSIS — M62830 Muscle spasm of back: Secondary | ICD-10-CM | POA: Diagnosis not present

## 2020-05-01 ENCOUNTER — Other Ambulatory Visit: Payer: Self-pay | Admitting: Nurse Practitioner

## 2020-05-01 DIAGNOSIS — G4733 Obstructive sleep apnea (adult) (pediatric): Secondary | ICD-10-CM | POA: Diagnosis not present

## 2020-05-01 NOTE — Telephone Encounter (Signed)
Requested Prescriptions  Pending Prescriptions Disp Refills  . FLUoxetine (PROZAC) 20 MG capsule [Pharmacy Med Name: FLUoxetine HCl 20 MG Oral Capsule] 90 capsule 0    Sig: TAKE 1 CAPSULE BY MOUTH  DAILY , WITH A  40MG   CAPSULE.  TOTAL 60MG  DAILY     Psychiatry:  Antidepressants - SSRI Passed - 05/01/2020 11:22 PM      Passed - Completed PHQ-2 or PHQ-9 in the last 360 days      Passed - Valid encounter within last 6 months    Recent Outpatient Visits          3 months ago Severe depression [facility])   [facility] [place], [facility] T, NP   8 months ago Chronic anxiety   [facility] [place] D[facility] [place], Roosvelt Maser   9 months ago Chronic anxiety   [facility] [place], Ro[place] Maser   1 year ago Hyperlipidemia, unspecified hyperlipidemia type   [facility], [place], [facility]   1 year ago Primary insomnia   [facility] [place], [facility], [place]      Future Appointments            In 7 months [facility]

## 2020-05-10 ENCOUNTER — Other Ambulatory Visit: Payer: Self-pay | Admitting: Nurse Practitioner

## 2020-05-11 NOTE — Telephone Encounter (Signed)
PT SCHEDULED FOR 05/14/2020

## 2020-05-11 NOTE — Telephone Encounter (Signed)
Pt was to Return in about 6 months (around 07/15/2020) for MOOD and COPD -- would benefit from spirometry -- meet new PCP.

## 2020-05-11 NOTE — Telephone Encounter (Signed)
Requested medication (s) are due for refill today:   No.     Pharmacy requesting a 1 yr supply for both.   One is a non delegated rx  Requested medication (s) are on the active medication list:   Yes  Future visit scheduled:   No   Last ordered: Seroquel XR 01/15/2020 #90, 4 refills;   Synthroid 11/27/2019 #90, 1 refill  Returned because Seroquel is a non delegated refill and pharmacy is requesting a 1 yr supply for both.   Requested Prescriptions  Pending Prescriptions Disp Refills   QUEtiapine Fumarate (SEROQUEL XR) 150 MG 24 hr tablet [Pharmacy Med Name: QUETIAPINE  150MG   TAB  XR] 90 tablet 3    Sig: TAKE 1 TABLET BY MOUTH AT  BEDTIME      Not Delegated - Psychiatry:  Antipsychotics - Second Generation (Atypical) - quetiapine Failed - 05/10/2020 10:43 PM      Failed - This refill cannot be delegated      Failed - ALT in normal range and within 180 days    ALT  Date Value Ref Range Status  07/15/2019 23 0 - 32 IU/L Final          Failed - AST in normal range and within 180 days    AST  Date Value Ref Range Status  07/15/2019 24 0 - 40 IU/L Final          Passed - Completed PHQ-2 or PHQ-9 in the last 360 days      Passed - Last BP in normal range    BP Readings from Last 1 Encounters:  01/15/20 95/70          Passed - Valid encounter within last 6 months    Recent Outpatient Visits           3 months ago Severe depression (HCC)   [facility] [place], [place], NP   9 months ago Chronic anxiety   [facility], Particia Nearing   10 months ago Chronic anxiety   [facility] [place], [place], [place]   1 year ago Hyperlipidemia, unspecified hyperlipidemia type   [facility], [facility], Salley Hews   1 year ago Primary insomnia   [facility] [place], [place], [place] Hews       Future Appointments             In 7 months [facility], [facility]                 levothyroxine (SYNTHROID) 50 MCG tablet [Pharmacy Med Name: Levothyroxine Sodium 50 MCG Oral Tablet] 90 tablet 3    Sig: TAKE 1 TABLET BY MOUTH  DAILY BEFORE BREAKFAST      Endocrinology:  Hypothyroid Agents Failed - 05/10/2020 10:43 PM      Failed - TSH needs to be rechecked within 3 months after an abnormal result. Refill until TSH is due.      Passed - TSH in normal range and within 360 days    TSH  Date Value Ref Range Status  07/15/2019 2.590 0.450 - 4.500 uIU/mL Final          Passed - Valid encounter within last 12 months    Recent Outpatient Visits           3 months ago Severe depression (HCC)   [facility] [place], [place] T, NP   9 months ago Chronic anxiety   [facility]  Lanora Manis, PA-C   10 months ago Chronic anxiety   [facility] Ro[place]velt Maser [place], [place]   1 year ago Hyperlipidemia, unspecified hyperlipidemia type   [facility] Particia Nearing, [place]   1 year ago Primary insomnia   [facility] Particia Nearing, [place]       Future Appointments             In 7 months [facility], [facility]

## 2020-05-11 NOTE — Telephone Encounter (Signed)
Would she need to be seen sooner than 6 months per last note for medication refills or would pt need to be seen before then?

## 2020-05-15 ENCOUNTER — Encounter: Payer: Self-pay | Admitting: Nurse Practitioner

## 2020-05-15 ENCOUNTER — Other Ambulatory Visit: Payer: Self-pay

## 2020-05-15 ENCOUNTER — Ambulatory Visit (INDEPENDENT_AMBULATORY_CARE_PROVIDER_SITE_OTHER): Payer: Medicare Other | Admitting: Nurse Practitioner

## 2020-05-15 VITALS — BP 122/70 | HR 68 | Temp 98.7°F | Wt 224.0 lb

## 2020-05-15 DIAGNOSIS — E785 Hyperlipidemia, unspecified: Secondary | ICD-10-CM

## 2020-05-15 DIAGNOSIS — I10 Essential (primary) hypertension: Secondary | ICD-10-CM | POA: Diagnosis not present

## 2020-05-15 DIAGNOSIS — F5101 Primary insomnia: Secondary | ICD-10-CM | POA: Diagnosis not present

## 2020-05-15 DIAGNOSIS — F322 Major depressive disorder, single episode, severe without psychotic features: Secondary | ICD-10-CM

## 2020-05-15 DIAGNOSIS — N183 Chronic kidney disease, stage 3 unspecified: Secondary | ICD-10-CM

## 2020-05-15 DIAGNOSIS — E038 Other specified hypothyroidism: Secondary | ICD-10-CM

## 2020-05-15 DIAGNOSIS — R7301 Impaired fasting glucose: Secondary | ICD-10-CM | POA: Diagnosis not present

## 2020-05-15 DIAGNOSIS — F411 Generalized anxiety disorder: Secondary | ICD-10-CM | POA: Diagnosis not present

## 2020-05-15 LAB — URINALYSIS, ROUTINE W REFLEX MICROSCOPIC
Bilirubin, UA: NEGATIVE
Glucose, UA: NEGATIVE
Ketones, UA: NEGATIVE
Leukocytes,UA: NEGATIVE
Nitrite, UA: NEGATIVE
Protein,UA: NEGATIVE
RBC, UA: NEGATIVE
Specific Gravity, UA: 1.01 (ref 1.005–1.030)
Urobilinogen, Ur: 0.2 mg/dL (ref 0.2–1.0)
pH, UA: 5.5 (ref 5.0–7.5)

## 2020-05-15 NOTE — Assessment & Plan Note (Signed)
Stable and well controlled, continue present medications.  Labs ordered today.

## 2020-05-15 NOTE — Progress Notes (Signed)
BP 122/70   Pulse 68   Temp 98.7 F (37.1 C)   Wt 224 lb (101.6 kg)   SpO2 97%   BMI 36.74 kg/m    Subjective:    Patient ID: Kayla Wright, female    DOB: February 19, 1955, 65 y.o.   MRN: [id number]  HPI: Kayla Wright is a 65 y.o. female  No chief complaint on file.  HYPERTENSION Hypertension status: controlled  Satisfied with current treatment? no Duration of hypertension: years BP monitoring frequency:  not checking BP range:  BP medication side effects:  no Medication compliance: excellent compliance Previous BP meds:clonidine Aspirin: no Recurrent headaches: no Visual changes: no Palpitations: no Dyspnea: no Chest pain: no Lower extremity edema: no Dizzy/lightheaded: no  ANXIETY/STRESS/DEPRESSION Duration:stable Anxious mood: no  Excessive worrying: no Irritability: no  Sweating: no Nausea: no Palpitations:no Hyperventilation: no Panic attacks: no Agoraphobia: no  Obscessions/compulsions: no Depressed mood: no Depression screen [facility] 2/9 05/15/2020 01/15/2020 12/23/2019 08/12/2019 08/01/2018  Decreased Interest 1 2 0 2 1  Down, Depressed, Hopeless 1 2 0 2 2  PHQ - 2 Score 2 4 0 4 3  Altered sleeping 0 1 0 1 2  Tired, decreased energy 1 2 0 2 2  Change in appetite 0 1 0 2 3  Feeling bad or failure about yourself  0 1 1 2 1   Trouble concentrating 1 2 2 1 2   Moving slowly or fidgety/restless 0 0 0 1 0  Suicidal thoughts 0 0 0 0 0  PHQ-9 Score 4 11 3 13 13   Difficult doing work/chores Not difficult at all Not difficult at all Not difficult at all - -  Some recent data might be hidden   Anhedonia: no Weight changes: no Insomnia: no hard to stay asleep  Hypersomnia: no Fatigue/loss of energy: no Feelings of worthlessness: no Feelings of guilt: no Impaired concentration/indecisiveness: no Suicidal ideations: no  Crying spells: no Recent Stressors/Life Changes: no   Relationship problems: no   Family stress: no     Financial stress: no    Job stress: no     Recent death/loss: no  HYPOTHYROIDISM Thyroid control status:controlled Satisfied with current treatment? yes Medication side effects: yes Medication compliance: excellent compliance Etiology of hypothyroidism:  Recent dose adjustment:no Fatigue: no Cold intolerance: no Heat intolerance: no Weight gain: no Weight loss: no Constipation: no Diarrhea/loose stools: no Palpitations: no Lower extremity edema: no Anxiety/depressed mood: no  Relevant past medical, surgical, family and social history reviewed and updated as indicated. Interim medical history since our last visit reviewed. Allergies and medications reviewed and updated.  Review of Systems  Eyes: Negative for visual disturbance.  Respiratory: Negative for cough, chest tightness and shortness of breath.   Cardiovascular: Negative for chest pain, palpitations and leg swelling.  Endocrine: Negative for polydipsia and polyuria.  Neurological: Negative for dizziness, light-headedness, numbness and headaches.    Per HPI unless specifically indicated above     Objective:    BP 122/70   Pulse 68   Temp 98.7 F (37.1 C)   Wt 224 lb (101.6 kg)   SpO2 97%   BMI 36.74 kg/m   Wt Readings from Last 3 Encounters:  05/15/20 224 lb (101.6 kg)  01/15/20 229 lb 6.4 oz (104.1 kg)  12/23/19 210 lb (95.3 kg)    Physical Exam Vitals and nursing note reviewed.  Constitutional:      General: She is not in acute distress.    Appearance: Normal appearance. She is  normal weight. She is not ill-appearing, toxic-appearing or diaphoretic.  HENT:     Head: Normocephalic.     Right Ear: External ear normal.     Left Ear: External ear normal.     Nose: Nose normal.     Mouth/Throat:     Mouth: Mucous membranes are moist.     Pharynx: Oropharynx is clear.  Eyes:     General:        Right eye: No discharge.        Left eye: No discharge.     Extraocular Movements: Extraocular movements intact.     Conjunctiva/sclera:  Conjunctivae normal.     Pupils: Pupils are equal, round, and reactive to light.  Cardiovascular:     Rate and Rhythm: Normal rate and regular rhythm.     Heart sounds: No murmur heard.   Pulmonary:     Effort: Pulmonary effort is normal. No respiratory distress.     Breath sounds: Normal breath sounds. No wheezing or rales.  Musculoskeletal:     Cervical back: Normal range of motion and neck supple.     Comments: Leg brace and one forearm crutch.   Skin:    General: Skin is warm and dry.     Capillary Refill: Capillary refill takes less than 2 seconds.  Neurological:     General: No focal deficit present.     Mental Status: She is alert and oriented to person, place, and time. Mental status is at baseline.  Psychiatric:        Mood and Affect: Mood normal.        Behavior: Behavior normal.        Thought Content: Thought content normal.        Judgment: Judgment normal.     Results for orders placed or performed in visit on 07/15/19  Comprehensive metabolic panel  Result Value Ref Range   Glucose 86 65 - 99 mg/dL   BUN 11 8 - 27 mg/dL   Creatinine, Ser 1.61 (H) 0.57 - 1.00 mg/dL   GFR calc non Af Amer 51 (L) >59 mL/min/1.73   GFR calc Af Amer 59 (L) >59 mL/min/1.73   BUN/Creatinine Ratio 10 (L) 12 - 28   Sodium 139 134 - 144 mmol/L   Potassium 4.3 3.5 - 5.2 mmol/L   Chloride 102 96 - 106 mmol/L   CO2 23 20 - 29 mmol/L   Calcium 9.2 8.7 - 10.3 mg/dL   Total Protein 7.2 6.0 - 8.5 g/dL   Albumin 4.6 3.8 - 4.8 g/dL   Globulin, Total 2.6 1.5 - 4.5 g/dL   Albumin/Globulin Ratio 1.8 1.2 - 2.2   Bilirubin Total 0.9 0.0 - 1.2 mg/dL   Alkaline Phosphatase 97 48 - 121 IU/L   AST 24 0 - 40 IU/L   ALT 23 0 - 32 IU/L  Lipid Panel w/o Chol/HDL Ratio  Result Value Ref Range   Cholesterol, Total 165 100 - 199 mg/dL   Triglycerides 096 (H) 0 - 149 mg/dL   HDL 44 >04 mg/dL   VLDL Cholesterol Cal 28 5 - 40 mg/dL   LDL Chol Calc (NIH) 93 0 - 99 mg/dL  TSH  Result Value Ref Range    TSH 2.590 0.450 - 4.500 uIU/mL  HgB A1c  Result Value Ref Range   Hgb A1c MFr Bld 5.9 (H) 4.8 - 5.6 %   Est. average glucose Bld gHb Est-mCnc 123 mg/dL      Assessment & Plan:  Problem List Items Addressed This Visit      Cardiovascular and Mediastinum   Essential hypertension - Primary    Stable and well controlled, continue present medications.  Labs ordered today.       Relevant Orders   Basic metabolic panel   Urinalysis, Routine w reflex microscopic     Endocrine   Hypothyroidism    Recheck TSH, adjust as needed. Continue current regimen.      Relevant Orders   TSH   IFG (impaired fasting glucose)    Labs ordered today.  Will make medication recommendations based on lab results.       Relevant Orders   HgB A1c     Genitourinary   CKD (chronic kidney disease) stage 3, GFR 30-59 ml/min (HCC)    Chronic.  Labs ordered today.  Will make medication adjustments as needed.         Other   Severe depression (HCC)    Chronic, stable.  Denies SI/HI.  Continue current medication regimen and adjust as needed.  Refills sent in.  Plan for return in 6 months.      Hyperlipidemia    Recheck lipids, adjust as needed. Continue current regimen.      Relevant Orders   Lipid panel   Urinalysis, Routine w reflex microscopic   Insomnia    Stable and overall under good control. Continue current regimen.      Relevant Orders   Urinalysis, Routine w reflex microscopic   Anxiety disorder    Chronic, stable.  Denies SI/HI.  Continue current medication regimen and adjust as needed.  Refills sent in.  Would avoid benzo due to opioid use and past addiction history.  Plan for return in 6 months.      Relevant Orders   Urinalysis, Routine w reflex microscopic       Follow up plan: Return in about 6 months (around 11/14/2020) for Physical and Fasting labs.

## 2020-05-15 NOTE — Assessment & Plan Note (Signed)
Chronic, stable.  Denies SI/HI.  Continue current medication regimen and adjust as needed.  Refills sent in.  Would avoid benzo due to opioid use and past addiction history.  Plan for return in 6 months.

## 2020-05-15 NOTE — Assessment & Plan Note (Signed)
Chronic.  Labs ordered today.  Will make medication adjustments as needed.

## 2020-05-15 NOTE — Assessment & Plan Note (Signed)
Stable and overall under good control. Continue current regimen.

## 2020-05-15 NOTE — Assessment & Plan Note (Signed)
Chronic, stable.  Denies SI/HI.  Continue current medication regimen and adjust as needed.  Refills sent in.  Plan for return in 6 months.

## 2020-05-15 NOTE — Assessment & Plan Note (Signed)
Recheck lipids, adjust as needed. Continue current regimen 

## 2020-05-15 NOTE — Assessment & Plan Note (Signed)
Labs ordered today.  Will make medication recommendations based on lab results.

## 2020-05-15 NOTE — Assessment & Plan Note (Signed)
Recheck TSH, adjust as needed. Continue current regimen 

## 2020-05-16 LAB — BASIC METABOLIC PANEL
BUN/Creatinine Ratio: 11 — ABNORMAL LOW (ref 12–28)
BUN: 11 mg/dL (ref 8–27)
CO2: 22 mmol/L (ref 20–29)
Calcium: 9.7 mg/dL (ref 8.7–10.3)
Chloride: 103 mmol/L (ref 96–106)
Creatinine, Ser: 1 mg/dL (ref 0.57–1.00)
Glucose: 93 mg/dL (ref 65–99)
Potassium: 4.3 mmol/L (ref 3.5–5.2)
Sodium: 141 mmol/L (ref 134–144)
eGFR: 63 mL/min/{1.73_m2} (ref >59)

## 2020-05-16 LAB — LIPID PANEL
Chol/HDL Ratio: 3.8 ratio (ref 0.0–4.4)
Cholesterol, Total: 165 mg/dL (ref 100–199)
HDL: 44 mg/dL (ref >39)
LDL Chol Calc (NIH): 92 mg/dL (ref 0–99)
Triglycerides: 166 mg/dL — ABNORMAL HIGH (ref 0–149)
VLDL Cholesterol Cal: 29 mg/dL (ref 5–40)

## 2020-05-16 LAB — TSH: TSH: 3.79 u[IU]/mL (ref 0.450–4.500)

## 2020-05-16 LAB — HEMOGLOBIN A1C
Est. average glucose Bld gHb Est-mCnc: 134 mg/dL
Hgb A1c MFr Bld: 6.3 % — ABNORMAL HIGH (ref 4.8–5.6)

## 2020-05-18 NOTE — Progress Notes (Signed)
Please let patient know that her Triglycerides are still elevated.  Recommend decreasing processed foods.  A1c increased from 5.9 to 6.3.  Recommend decreasing Carbohydrates in her diet to help with this. Other lab work is stable.  Follow up as discussed.

## 2020-05-21 DIAGNOSIS — E7800 Pure hypercholesterolemia, unspecified: Secondary | ICD-10-CM | POA: Diagnosis not present

## 2020-05-21 DIAGNOSIS — I10 Essential (primary) hypertension: Secondary | ICD-10-CM | POA: Diagnosis not present

## 2020-05-21 DIAGNOSIS — R0602 Shortness of breath: Secondary | ICD-10-CM | POA: Diagnosis not present

## 2020-05-21 DIAGNOSIS — G4733 Obstructive sleep apnea (adult) (pediatric): Secondary | ICD-10-CM | POA: Diagnosis not present

## 2020-05-22 DIAGNOSIS — M62830 Muscle spasm of back: Secondary | ICD-10-CM | POA: Diagnosis not present

## 2020-05-22 DIAGNOSIS — M25572 Pain in left ankle and joints of left foot: Secondary | ICD-10-CM | POA: Diagnosis not present

## 2020-05-22 DIAGNOSIS — J449 Chronic obstructive pulmonary disease, unspecified: Secondary | ICD-10-CM | POA: Diagnosis not present

## 2020-05-22 DIAGNOSIS — Z9181 History of falling: Secondary | ICD-10-CM | POA: Diagnosis not present

## 2020-05-22 DIAGNOSIS — M47817 Spondylosis without myelopathy or radiculopathy, lumbosacral region: Secondary | ICD-10-CM | POA: Diagnosis not present

## 2020-05-22 DIAGNOSIS — Z79899 Other long term (current) drug therapy: Secondary | ICD-10-CM | POA: Diagnosis not present

## 2020-05-23 ENCOUNTER — Other Ambulatory Visit: Payer: Self-pay | Admitting: Nurse Practitioner

## 2020-05-24 NOTE — Telephone Encounter (Signed)
Requested Prescriptions  Pending Prescriptions Disp Refills  . simvastatin (ZOCOR) 40 MG tablet [Pharmacy Med Name: Simvastatin 40 MG Oral Tablet] 90 tablet 3    Sig: TAKE 1 TABLET BY MOUTH  DAILY     Cardiovascular:  Antilipid - Statins Failed - 05/23/2020 10:40 PM      Failed - Triglycerides in normal range and within 360 days    Triglycerides  Date Value Ref Range Status  05/15/2020 166 (H) 0 - 149 mg/dL Final         Passed - Total Cholesterol in normal range and within 360 days    Cholesterol, Total  Date Value Ref Range Status  05/15/2020 165 100 - 199 mg/dL Final         Passed - LDL in normal range and within 360 days    LDL Chol Calc (NIH)  Date Value Ref Range Status  05/15/2020 92 0 - 99 mg/dL Final         Passed - HDL in normal range and within 360 days    HDL  Date Value Ref Range Status  05/15/2020 44 >39 mg/dL Final         Passed - Patient is not pregnant      Passed - Valid encounter within last 12 months    Recent Outpatient Visits          1 week ago Essential hypertension   [facility] Larae Grooms, NP   4 months ago Severe depression [facility])   [facility] Marjie Skiff, NP   9 months ago Chronic anxiety   [facility] Particia Nearing, [place]   10 months ago Chronic anxiety   [facility] Roosvelt Maser [place], [place]   1 year ago Hyperlipidemia, unspecified hyperlipidemia type   [facility] [place], Salley Hews, [place]      Future Appointments            In 5 months Larae Grooms, NP [facility], [facility]   In 7 months  [facility], [facility]

## 2020-06-19 DIAGNOSIS — M25572 Pain in left ankle and joints of left foot: Secondary | ICD-10-CM | POA: Diagnosis not present

## 2020-06-19 DIAGNOSIS — Z9181 History of falling: Secondary | ICD-10-CM | POA: Diagnosis not present

## 2020-06-19 DIAGNOSIS — J449 Chronic obstructive pulmonary disease, unspecified: Secondary | ICD-10-CM | POA: Diagnosis not present

## 2020-06-19 DIAGNOSIS — M62830 Muscle spasm of back: Secondary | ICD-10-CM | POA: Diagnosis not present

## 2020-06-19 DIAGNOSIS — M47817 Spondylosis without myelopathy or radiculopathy, lumbosacral region: Secondary | ICD-10-CM | POA: Diagnosis not present

## 2020-06-19 DIAGNOSIS — Z79899 Other long term (current) drug therapy: Secondary | ICD-10-CM | POA: Diagnosis not present

## 2020-06-25 ENCOUNTER — Other Ambulatory Visit: Payer: Self-pay | Admitting: Nurse Practitioner

## 2020-07-20 DIAGNOSIS — M47817 Spondylosis without myelopathy or radiculopathy, lumbosacral region: Secondary | ICD-10-CM | POA: Diagnosis not present

## 2020-07-20 DIAGNOSIS — M62830 Muscle spasm of back: Secondary | ICD-10-CM | POA: Diagnosis not present

## 2020-07-20 DIAGNOSIS — M25572 Pain in left ankle and joints of left foot: Secondary | ICD-10-CM | POA: Diagnosis not present

## 2020-07-20 DIAGNOSIS — Z79899 Other long term (current) drug therapy: Secondary | ICD-10-CM | POA: Diagnosis not present

## 2020-07-20 DIAGNOSIS — J449 Chronic obstructive pulmonary disease, unspecified: Secondary | ICD-10-CM | POA: Diagnosis not present

## 2020-07-20 DIAGNOSIS — Z9181 History of falling: Secondary | ICD-10-CM | POA: Diagnosis not present

## 2020-07-25 ENCOUNTER — Other Ambulatory Visit: Payer: Self-pay | Admitting: Nurse Practitioner

## 2020-07-26 NOTE — Telephone Encounter (Signed)
Requested Prescriptions  Pending Prescriptions Disp Refills  . FLUoxetine (PROZAC) 20 MG capsule [Pharmacy Med Name: FLUoxetine HCl 20 MG Oral Capsule] 90 capsule 0    Sig: TAKE 1 CAPSULE BY MOUTH  DAILY WITH A 40 MG CAPSULE  . TOTAL 60 MG DAILY     Psychiatry:  Antidepressants - SSRI Passed - 07/25/2020 11:01 PM      Passed - Completed PHQ-2 or PHQ-9 in the last 360 days      Passed - Valid encounter within last 6 months    Recent Outpatient Visits          2 months ago Essential hypertension   [facility] Larae Grooms, NP   6 months ago Severe depression [facility])   [facility] Marjie Skiff, NP   11 months ago Chronic anxiety   [facility] Particia [place], [place]   1 year ago Chronic anxiety   [facility] Roosvelt Maser [place], [place]   1 year ago Hyperlipidemia, unspecified hyperlipidemia type   [facility], Salley Hews, [place]      Future Appointments            In 3 months Larae Grooms, NP [facility], [facility]   In 5 months  [facility], [facility]

## 2020-08-03 ENCOUNTER — Other Ambulatory Visit: Payer: Self-pay | Admitting: Nurse Practitioner

## 2020-08-09 ENCOUNTER — Other Ambulatory Visit: Payer: Self-pay | Admitting: Nurse Practitioner

## 2020-08-10 NOTE — Telephone Encounter (Signed)
Future visit in 3 months  

## 2020-08-19 DIAGNOSIS — M25572 Pain in left ankle and joints of left foot: Secondary | ICD-10-CM | POA: Diagnosis not present

## 2020-08-19 DIAGNOSIS — M62830 Muscle spasm of back: Secondary | ICD-10-CM | POA: Diagnosis not present

## 2020-08-19 DIAGNOSIS — Z79899 Other long term (current) drug therapy: Secondary | ICD-10-CM | POA: Diagnosis not present

## 2020-08-19 DIAGNOSIS — M47817 Spondylosis without myelopathy or radiculopathy, lumbosacral region: Secondary | ICD-10-CM | POA: Diagnosis not present

## 2020-08-19 DIAGNOSIS — J449 Chronic obstructive pulmonary disease, unspecified: Secondary | ICD-10-CM | POA: Diagnosis not present

## 2020-08-19 DIAGNOSIS — Z9181 History of falling: Secondary | ICD-10-CM | POA: Diagnosis not present

## 2020-10-21 ENCOUNTER — Other Ambulatory Visit: Payer: Self-pay | Admitting: Nurse Practitioner

## 2020-11-02 ENCOUNTER — Other Ambulatory Visit: Payer: Self-pay | Admitting: Nurse Practitioner

## 2020-11-09 ENCOUNTER — Other Ambulatory Visit: Payer: Self-pay

## 2020-11-09 ENCOUNTER — Ambulatory Visit (INDEPENDENT_AMBULATORY_CARE_PROVIDER_SITE_OTHER): Payer: Medicare Other | Admitting: Internal Medicine

## 2020-11-09 VITALS — BP 114/59 | HR 67 | Resp 16 | Ht 68.0 in | Wt 223.9 lb

## 2020-11-09 DIAGNOSIS — I10 Essential (primary) hypertension: Secondary | ICD-10-CM | POA: Diagnosis not present

## 2020-11-09 DIAGNOSIS — J4541 Moderate persistent asthma with (acute) exacerbation: Secondary | ICD-10-CM

## 2020-11-09 DIAGNOSIS — Z7189 Other specified counseling: Secondary | ICD-10-CM | POA: Diagnosis not present

## 2020-11-09 DIAGNOSIS — G4733 Obstructive sleep apnea (adult) (pediatric): Secondary | ICD-10-CM

## 2020-11-09 DIAGNOSIS — Z9989 Dependence on other enabling machines and devices: Secondary | ICD-10-CM

## 2020-11-09 NOTE — Patient Instructions (Signed)

## 2020-11-09 NOTE — Progress Notes (Signed)
[facility] [address] [place], [place] [address]  Pulmonary Sleep Medicine   Office Visit Note  Patient Name: Kayla Wright DOB: 11/07/1955 MRN [id number]    Chief Complaint: Obstructive Sleep Apnea visit  Brief History:  Jadie is seen today for one year follow up The patient has a 2 year history of sleep apnea. Patient is using PAP nightly @ APAP 5 - 20 cmH2O.  The patient feels good after sleeping with PAP.  The patient reports benefiting from PAP use. Reported sleepiness is  resolved and the Epworth Sleepiness Score is 1 out of 24. The patient does not take naps. The patient complains of the following: nothing  The compliance download shows  compliance with an average use time of 7:05 hours @ 94%. The AHI is 1.7  The patient does not complain of limb movements disrupting sleep.  ROS  General: (-) fever, (-) chills, (-) night sweat Nose and Sinuses: (-) nasal stuffiness or itchiness, (-) postnasal drip, (-) nosebleeds, (-) sinus trouble. Mouth and Throat: (-) sore throat, (-) hoarseness. Neck: (-) swollen glands, (-) enlarged thyroid, (-) neck pain. Respiratory: - cough, - shortness of breath, - wheezing. Neurologic: - numbness, - tingling. Psychiatric: + anxiety, + depression   Current Medication: Outpatient Encounter Medications as of 11/09/2020  Medication Sig   albuterol (VENTOLIN HFA) 108 (90 Base) MCG/ACT inhaler USE 1 INHALATION BY MOUTH  EVERY 6 HOURS AS NEEDED   Ascorbic Acid (VITAMIN C) 1000 MG tablet Take 1 tablet (1,000 mg total) by mouth daily.   Cholecalciferol (VITAMIN D3) 125 MCG (5000 UT) CAPS Take 1 capsule (5,000 Units total) by mouth daily.   cloNIDine (CATAPRES) 0.2 MG tablet TAKE 1 TABLET BY MOUTH 3  TIMES DAILY   Cyanocobalamin (VITAMIN B-12 PO) Take 1 tablet by mouth daily.   FLUoxetine (PROZAC) 20 MG capsule TAKE 1 CAPSULE BY MOUTH  DAILY WITH A 40 MG CAPSULE  FOR TOTAL 60 MG DAILY   FLUoxetine (PROZAC) 40 MG capsule TAKE 1 CAPSULE BY  MOUTH  DAILY   hydrOXYzine (ATARAX/VISTARIL) 25 MG tablet TAKE 1 TABLET BY MOUTH AT  BEDTIME AS NEEDED   levothyroxine (SYNTHROID) 50 MCG tablet TAKE 1 TABLET BY MOUTH  DAILY BEFORE BREAKFAST   oxyCODONE-acetaminophen (PERCOCET/ROXICET) 5-325 MG tablet Take 1 tablet by mouth 4 (four) times daily as needed.   QUEtiapine Fumarate (SEROQUEL XR) 150 MG 24 hr tablet TAKE 1 TABLET BY MOUTH AT  BEDTIME   simvastatin (ZOCOR) 40 MG tablet TAKE 1 TABLET BY MOUTH  DAILY   umeclidinium-vilanterol (ANORO ELLIPTA) 62.5-25 MCG/INH AEPB Inhale 1 puff into the lungs daily.   No facility-administered encounter medications on file as of 11/09/2020.    Surgical History: Past Surgical History:  Procedure Laterality Date   ANKLE FRACTURE SURGERY  06/18/15   COLONOSCOPY WITH PROPOFOL N/A 08/31/2017   Procedure: COLONOSCOPY WITH PROPOFOL;  Surgeon: Toney Reil, MD;  Location: [facility] ENDOSCOPY;  Service: Gastroenterology;  Laterality: N/A;   TUBAL LIGATION      Medical History: Past Medical History:  Diagnosis Date   Allergy    Common migraine    Depression    GERD (gastroesophageal reflux disease)    Hyperlipidemia    Hypertension    Insomnia    Menopausal symptom    Osteopenia    Thyroid disease     Family History: Non contributory to the present illness  Social History: Social History   Socioeconomic History   Marital status: Divorced  Spouse name: Not on file   Number of children: Not on file   Years of education: Not on file   Highest education level: Not on file  Occupational History   Occupation: retired  Tobacco Use   Smoking status: Never   Smokeless tobacco: Never  Vaping Use   Vaping Use: Never used  Substance and Sexual Activity   Alcohol use: No    Alcohol/week: 0.0 standard drinks   Drug use: Yes    Types: Oxycodone   Sexual activity: Not Currently    Birth control/protection: Surgical  Other Topics Concern   Not on file  Social History Narrative   Not on  file   Social Determinants of Health   Financial Resource Strain: Low Risk    Difficulty of Paying Living Expenses: Not hard at all  Food Insecurity: No Food Insecurity   Worried About Programme researcher, broadcasting/film/video in the Last Year: Never true   Ran Out of Food in the Last Year: Never true  Transportation Needs: No Transportation Needs   Lack of Transportation (Medical): No   Lack of Transportation (Non-Medical): No  Physical Activity: Sufficiently Active   Days of Exercise per Week: 4 days   Minutes of Exercise per Session: 60 min  Stress: No Stress Concern Present   Feeling of Stress : Not at all  Social Connections: Not on file  Intimate Partner Violence: Not on file    Vital Signs: There were no vitals taken for this visit.  Examination: General Appearance: The patient is well-developed, well-nourished, and in no distress. Neck Circumference: 42 Skin: Gross inspection of skin unremarkable. Head: normocephalic, no gross deformities. Eyes: no gross deformities noted. ENT: ears appear grossly normal Neurologic: Alert and oriented. No involuntary movements.    EPWORTH SLEEPINESS SCALE:  Scale:  (0)= no chance of dozing; (1)= slight chance of dozing; (2)= moderate chance of dozing; (3)= high chance of dozing  Chance  Situtation    Sitting and reading:     Watching TV: 0    Sitting Inactive in public: 0    As a passenger in car: 0      Lying down to rest: 0    Sitting and talking: 0    Sitting quielty after lunch: 0    In a car, stopped in traffic: 0   TOTAL SCORE:   1 out of 24    SLEEP STUDIES:  PSG 05/22/18 AHI 44 SpO11min 79%   CPAP COMPLIANCE DATA:  Date Range: 11/09/19-11/07/20  Average Daily Use: 7:05 hours  Median Use: 7:28  hours  Compliance for > 4 Hours: 94%  AHI: 1.7 respiratory events per hour  Days Used: 358/365  Mask Leak: 19.9 lpm  95th Percentile Pressure: 14.1 cmH2O         LABS: No results found for this or any  previous visit (from the past 2160 hour(s)).  Radiology: MM 3D SCREEN BREAST BILATERAL  Result Date: 10/31/2018 CLINICAL DATA:  Screening. EXAM: DIGITAL SCREENING BILATERAL MAMMOGRAM WITH TOMO AND CAD COMPARISON:  Previous exam(s). ACR Breast Density Category a: The breast tissue is almost entirely fatty. FINDINGS: There are no findings suspicious for malignancy. Images were processed with CAD. IMPRESSION: No mammographic evidence of malignancy. A result letter of this screening mammogram will be mailed directly to the patient. RECOMMENDATION: Screening mammogram in one year. (Code:SM-B-01Y) BI-RADS CATEGORY  1: Negative. Electronically Signed   By: Frederico Hamman M.D.   On: 10/31/2018 15:24    No results found.  No results found.    Assessment and Plan: Patient Active Problem List   Diagnosis Date Noted   CKD (chronic kidney disease) stage 3, GFR 30-59 ml/min (HCC) 01/10/2020   IFG (impaired fasting glucose) 07/17/2019   Essential hypertension 09/27/2017   History of cocaine use 12/06/2016   Pain medication agreement broken 12/06/2016   Neurogenic pain 11/09/2016   Chronic bilateral low back pain with left-sided sciatica 11/09/2016   DDD (degenerative disc disease), cervical (C5-6 and C6-7) 10/11/2016   Chronic Cervical foraminal stenosis (C3-4) (Right) 10/11/2016   Osteopenia of lumbar spine 10/11/2016   DDD (degenerative disc disease), lumbar (L1-2 and L5-S1) 10/11/2016   Chronic lower extremity pain (Left) 08/29/2016   Chronic pain syndrome 08/29/2016   Long term prescription opiate use 08/29/2016   Obesity (BMI 30-39.9) 08/09/2016   Asthma 08/09/2016   Allergic rhinitis 06/17/2016   Insomnia 11/02/2015   Murmur 07/10/2015   Severe depression (HCC) 10/21/2014   Hyperlipidemia 10/21/2014   Hypothyroidism 10/21/2014   Anxiety disorder 10/21/2014   Sleep apnea 07/02/2014    1. OSA on CPAP The patient does  tolerate PAP and reports  benefit from PAP use. The patient was  reminded how to clean equipment and advised to replace supplies regularly. The patient was also counselled on weight loss. The compliance is very good . The AHI is 1.7.   OSA- Continue with very good compliance. Stop using cleaning device. Cleaning instructions given. F/u one year.    2. CPAP use counseling CPAP Counseling: had a lengthy discussion with the patient regarding the importance of PAP therapy in management of the sleep apnea. Patient appears to understand the risk factor reduction and also understands the risks associated with untreated sleep apnea. Patient will try to make a good faith effort to remain compliant with therapy. Also instructed the patient on proper cleaning of the device including the water must be changed daily if possible and use of distilled water is preferred. Patient understands that the machine should be regularly cleaned with appropriate recommended cleaning solutions that do not damage the PAP machine for example given white vinegar and water rinses. Other methods such as ozone treatment may not be as good as these simple methods to achieve cleaning.   3. Essential hypertension Hypertension Counseling:   The following hypertensive lifestyle modification were recommended and discussed:  1. Limiting alcohol intake to less than 1 oz/day of ethanol:(24 oz of beer or 8 oz of wine or 2 oz of 100-proof whiskey). 2. Take baby ASA 81 mg daily. 3. Importance of regular aerobic exercise and losing weight. 4. Reduce dietary saturated fat and cholesterol intake for overall cardiovascular health. 5. Maintaining adequate dietary potassium, calcium, and magnesium intake. 6. Regular monitoring of the blood pressure. 7. Reduce sodium intake to less than 100 mmol/day (less than 2.3 gm of sodium or less than 6 gm of sodium choride)    4. Moderate persistent asthma with acute exacerbation Stable with the use of anora ellipta. Continue.    General Counseling: I have discussed the  findings of the evaluation and examination with Windell Moulding.  I have also discussed any further diagnostic evaluation thatmay be needed or ordered today. Keliyah verbalizes understanding of the findings of todays visit. We also reviewed her medications today and discussed drug interactions and side effects including but not limited excessive drowsiness and altered mental states. We also discussed that there is always a risk not just to her but also people around her. she has been encouraged  to call the office with any questions or concerns that should arise related to todays visit.  No orders of the defined types were placed in this encounter.       I have personally obtained a history, examined the patient, evaluated laboratory and imaging results, formulated the assessment and plan and placed orders.   This patient was seen today by Emmaline Kluver, PA-C in collaboration with Dr. Freda Munro.    Yevonne Pax, MD [employer] [facility] [employer] Diplomate ABMS Pulmonary and Critical Care Medicine Sleep medicine

## 2020-11-16 ENCOUNTER — Encounter: Payer: Medicare Other | Admitting: Nurse Practitioner

## 2020-11-16 NOTE — Progress Notes (Deleted)
There were no vitals taken for this visit.   Subjective:    Patient ID: Kayla Wright, female    DOB: 16-Mar-1955, 65 y.o.   MRN: [id number]  HPI: Kayla Wright is a 65 y.o. female presenting on 11/16/2020 for comprehensive medical examination. Current medical complaints include:{Blank single:19197::"none","***"}  She currently lives with: Menopausal Symptoms: {Blank single:19197::"yes","no"}  HYPERTENSION / [place] Satisfied with current treatment? {Blank single:19197::"yes","no"} Duration of hypertension: {Blank single:19197::"chronic","months","years"} BP monitoring frequency: {Blank single:19197::"not checking","rarely","daily","weekly","monthly","a few times a day","a few times a week","a few times a month"} BP range:  BP medication side effects: {Blank single:19197::"yes","no"} Past BP meds: {Blank XMIWOEHO:12248::"GNOI","BBCWUGQBVQ","XIHWTUUEKC/MKLKJZPHXT","AVWPVXYI","AXKPVVZSMO","LMBEMLJQGB/EEFE","OFHQRFXJOI (bystolic)","carvedilol","chlorthalidone","clonidine","diltiazem","exforge HCT","HCTZ","irbesartan (avapro)","labetalol","lisinopril","lisinopril-HCTZ","losartan (cozaar)","methyldopa","nifedipine","olmesartan (benicar)","olmesartan-HCTZ","quinapril","ramipril","spironalactone","tekturna","valsartan","valsartan-HCTZ","verapamil"} Duration of hyperlipidemia: {Blank single:19197::"chronic","months","years"} Cholesterol medication side effects: {Blank single:19197::"yes","no"} Cholesterol supplements: {Blank multiple:19196::"none","fish oil","niacin","red yeast rice"} Past cholesterol medications: {Blank multiple:19196::"none","atorvastain (lipitor)","lovastatin (mevacor)","pravastatin (pravachol)","rosuvastatin (crestor)","simvastatin (zocor)","vytorin","fenofibrate (tricor)","gemfibrozil","ezetimide (zetia)","niaspan","lovaza"} Medication compliance: {Blank single:19197::"excellent compliance","good compliance","fair compliance","poor compliance"} Aspirin: {Blank  single:19197::"yes","no"} Recent stressors: {Blank single:19197::"yes","no"} Recurrent headaches: {Blank single:19197::"yes","no"} Visual changes: {Blank single:19197::"yes","no"} Palpitations: {Blank single:19197::"yes","no"} Dyspnea: {Blank single:19197::"yes","no"} Chest pain: {Blank single:19197::"yes","no"} Lower extremity edema: {Blank single:19197::"yes","no"} Dizzy/lightheaded: {Blank single:19197::"yes","no"}  DEPRESSION/ANXIETY   CHRONIC KIDNEY DISEASE CKD status: {Blank single:19197::"controlled","uncontrolled","better","worse","exacerbated","stable"} Medications renally dose: {Blank single:19197::"yes","no"} Previous renal evaluation: {Blank single:19197::"yes","no"} Pneumovax:  {Blank single:19197::"Up to Date","Not up to Date","unknown"} Influenza Vaccine:  {Blank single:19197::"Up to Date","Not up to Date","unknown"}  HYPOTHYROIDISM Thyroid control status:{Blank single:19197::"controlled","uncontrolled","better","worse","exacerbated","stable"} Satisfied with current treatment? {Blank single:19197::"yes","no"} Medication side effects: {Blank single:19197::"yes","no"} Medication compliance: {Blank single:19197::"excellent compliance","good compliance","fair compliance","poor compliance"} Etiology of hypothyroidism:  Recent dose adjustment:{Blank single:19197::"yes","no"} Fatigue: {Blank single:19197::"yes","no"} Cold intolerance: {Blank single:19197::"yes","no"} Heat intolerance: {Blank single:19197::"yes","no"} Weight gain: {Blank single:19197::"yes","no"} Weight loss: {Blank single:19197::"yes","no"} Constipation: {Blank single:19197::"yes","no"} Diarrhea/loose stools: {Blank single:19197::"yes","no"} Palpitations: {Blank single:19197::"yes","no"} Lower extremity edema: {Blank single:19197::"yes","no"} Anxiety/depressed mood: {Blank single:19197::"yes","no"}     Depression Screen done today and results listed below:  Depression screen [facility] 2/9 05/15/2020  01/15/2020 12/23/2019 08/12/2019 08/01/2018  Decreased Interest 1 2 0 2 1  Down, Depressed, Hopeless 1 2 0 2 2  PHQ - 2 Score 2 4 0 4 3  Altered sleeping 0 1 0 1 2  Tired, decreased energy 1 2 0 2 2  Change in appetite 0 1 0 2 3  Feeling bad or failure about yourself  0 _0 Trouble concentrating _1 Moving slowly or fidgety/restless 0 0 0 1 0  Suicidal thoughts 0 0 0 0 0  PHQ-9 Score _2 Difficult doing work/chores Not difficult at all Not difficult at all Not difficult at all - -  Some recent data might be hidden    The patient {has/does not have:19849} a history of falls. I {did/did not:19850} complete a risk assessment for falls. A plan of care for falls {was/was not:19852} documented.   Past Medical History:  Past Medical History:  Diagnosis Date   Allergy    Common migraine    Depression    GERD (gastroesophageal reflux disease)    Hyperlipidemia    Hypertension    Insomnia    Menopausal symptom    Osteopenia    Thyroid disease     Surgical History:  Past Surgical History:  Procedure Laterality Date   ANKLE FRACTURE SURGERY  06/18/15   COLONOSCOPY WITH PROPOFOL N/A 08/31/2017   Procedure: COLONOSCOPY WITH PROPOFOL;  Surgeon: Lin Landsman, MD;  Location: [facility] ENDOSCOPY;  Service: Gastroenterology;  Laterality: N/A;   TUBAL LIGATION      Medications:  Current Outpatient Medications on File Prior  to Visit  Medication Sig   albuterol (VENTOLIN HFA) 108 (90 Base) MCG/ACT inhaler USE 1 INHALATION BY MOUTH  EVERY 6 HOURS AS NEEDED   Ascorbic Acid (VITAMIN C) 1000 MG tablet Take 1 tablet (1,000 mg total) by mouth daily.   Cholecalciferol (VITAMIN D3) 125 MCG (5000 UT) CAPS Take 1 capsule (5,000 Units total) by mouth daily.   cloNIDine (CATAPRES) 0.2 MG tablet TAKE 1 TABLET BY MOUTH 3  TIMES DAILY   Cyanocobalamin (VITAMIN B-12 PO) Take 1 tablet by mouth daily.   FLUoxetine (PROZAC) 20 MG capsule TAKE 1 CAPSULE BY MOUTH  DAILY WITH A 40 MG CAPSULE   FOR TOTAL 60 MG DAILY   FLUoxetine (PROZAC) 40 MG capsule TAKE 1 CAPSULE BY MOUTH  DAILY   hydrOXYzine (ATARAX/VISTARIL) 25 MG tablet TAKE 1 TABLET BY MOUTH AT  BEDTIME AS NEEDED   levothyroxine (SYNTHROID) 50 MCG tablet TAKE 1 TABLET BY MOUTH  DAILY BEFORE BREAKFAST   oxyCODONE-acetaminophen (PERCOCET/ROXICET) 5-325 MG tablet Take 1 tablet by mouth 4 (four) times daily as needed.   QUEtiapine Fumarate (SEROQUEL XR) 150 MG 24 hr tablet TAKE 1 TABLET BY MOUTH AT  BEDTIME   simvastatin (ZOCOR) 40 MG tablet TAKE 1 TABLET BY MOUTH  DAILY   umeclidinium-vilanterol (ANORO ELLIPTA) 62.5-25 MCG/INH AEPB Inhale 1 puff into the lungs daily.   No current facility-administered medications on file prior to visit.    Allergies:  No Known Allergies  Social History:  Social History   Socioeconomic History   Marital status: Divorced    Spouse name: Not on file   Number of children: Not on file   Years of education: Not on file   Highest education level: Not on file  Occupational History   Occupation: retired  Tobacco Use   Smoking status: Never   Smokeless tobacco: Never  Vaping Use   Vaping Use: Never used  Substance and Sexual Activity   Alcohol use: No    Alcohol/week: 0.0 standard drinks   Drug use: Yes    Types: Oxycodone   Sexual activity: Not Currently    Birth control/protection: Surgical  Other Topics Concern   Not on file  Social History Narrative   Not on file   Social Determinants of Health   Financial Resource Strain: Low Risk    Difficulty of Paying Living Expenses: Not hard at all  Food Insecurity: No Food Insecurity   Worried About Charity fundraiser in the Last Year: Never true   [place] in the Last Year: Never true  Transportation Needs: No Transportation Needs   Lack of Transportation (Medical): No   Lack of Transportation (Non-Medical): No  Physical Activity: Sufficiently Active   Days of Exercise per Week: 4 days   Minutes of Exercise per Session:  60 min  Stress: No Stress Concern Present   Feeling of Stress : Not at all  Social Connections: Not on file  Intimate Partner Violence: Not on file   Social History   Tobacco Use  Smoking Status Never  Smokeless Tobacco Never   Social History   Substance and Sexual Activity  Alcohol Use No   Alcohol/week: 0.0 standard drinks    Family History:  Family History  Problem Relation Age of Onset   Diabetes Mother    Heart disease Mother    Cirrhosis Mother    Heart disease Father     Past medical history, surgical history, medications, allergies, family history and social history reviewed with  patient today and changes made to appropriate areas of the chart.   ROS All other ROS negative except what is listed above and in the HPI.      Objective:    There were no vitals taken for this visit.  Wt Readings from Last 3 Encounters:  11/09/20 223 lb 14.4 oz (101.6 kg)  05/15/20 224 lb (101.6 kg)  01/15/20 229 lb 6.4 oz (104.1 kg)    Physical Exam  Results for orders placed or performed in visit on 05/15/20  Lipid panel  Result Value Ref Range   Cholesterol, Total 165 100 - 199 mg/dL   Triglycerides 166 (H) 0 - 149 mg/dL   HDL 44 >39 mg/dL   VLDL Cholesterol Cal 29 5 - 40 mg/dL   LDL Chol Calc (NIH) 92 0 - 99 mg/dL   Chol/HDL Ratio 3.8 0.0 - 4.4 ratio  Basic metabolic panel  Result Value Ref Range   Glucose 93 65 - 99 mg/dL   BUN 11 8 - 27 mg/dL   Creatinine, Ser 1.00 0.57 - 1.00 mg/dL   eGFR 63 >59 mL/min/1.73   BUN/Creatinine Ratio 11 (L) 12 - 28   Sodium 141 134 - 144 mmol/L   Potassium 4.3 3.5 - 5.2 mmol/L   Chloride 103 96 - 106 mmol/L   CO2 22 20 - 29 mmol/L   Calcium 9.7 8.7 - 10.3 mg/dL  Urinalysis, Routine w reflex microscopic  Result Value Ref Range   Specific Gravity, UA 1.010 1.005 - 1.030   pH, UA 5.5 5.0 - 7.5   Color, UA Yellow Yellow   Appearance Ur Clear Clear   Leukocytes,UA Negative Negative   Protein,UA Negative Negative/Trace   Glucose,  UA Negative Negative   Ketones, UA Negative Negative   RBC, UA Negative Negative   Bilirubin, UA Negative Negative   Urobilinogen, Ur 0.2 0.2 - 1.0 mg/dL   Nitrite, UA Negative Negative  HgB A1c  Result Value Ref Range   Hgb A1c MFr Bld 6.3 (H) 4.8 - 5.6 %   Est. average glucose Bld gHb Est-mCnc 134 mg/dL  TSH  Result Value Ref Range   TSH 3.790 0.450 - 4.500 uIU/mL      Assessment & Plan:   Problem List Items Addressed This Visit       Cardiovascular and Mediastinum   Essential hypertension - Primary     Endocrine   Hypothyroidism   IFG (impaired fasting glucose)     Genitourinary   CKD (chronic kidney disease) stage 3, GFR 30-59 ml/min (HCC)     Other   Severe depression (HCC)   Hyperlipidemia     Follow up plan: No follow-ups on file.   LABORATORY TESTING:  - Pap smear: {Blank HQIONG:29528::"UXL done","not applicable","up to date","done elsewhere"}  IMMUNIZATIONS:   - Tdap: Tetanus vaccination status reviewed: last tetanus booster within 10 years. - Influenza: {Blank single:19197::"Up to date","Administered today","Postponed to flu season","Refused","Given elsewhere"} - Pneumovax: Not applicable - Prevnar: Not applicable - HPV: Not applicable - Zostavax vaccine: {Blank single:19197::"Up to date","Administered today","Not applicable","Refused","Given elsewhere"}  SCREENING: -Mammogram: {Blank single:19197::"Up to date","Ordered today","Not applicable","Refused","Done elsewhere"}  - Colonoscopy: Up to date  - Bone Density: Not applicable  -Hearing Test: Not applicable  -Spirometry: Not applicable   PATIENT COUNSELING:   Advised to take 1 mg of folate supplement per day if capable of pregnancy.   Sexuality: Discussed sexually transmitted diseases, partner selection, use of condoms, avoidance of unintended pregnancy  and contraceptive alternatives.   Advised to  avoid cigarette smoking.  I discussed with the patient that most people either abstain from  alcohol or drink within safe limits (<=14/week and <=4 drinks/occasion for males, <=7/weeks and <= 3 drinks/occasion for females) and that the risk for alcohol disorders and other health effects rises proportionally with the number of drinks per week and how often a drinker exceeds daily limits.  Discussed cessation/primary prevention of drug use and availability of treatment for abuse.   Diet: Encouraged to adjust caloric intake to maintain  or achieve ideal body weight, to reduce intake of dietary saturated fat and total fat, to limit sodium intake by avoiding high sodium foods and not adding table salt, and to maintain adequate dietary potassium and calcium preferably from fresh fruits, vegetables, and low-fat dairy products.    stressed the importance of regular exercise  Injury prevention: Discussed safety belts, safety helmets, smoke detector, smoking near bedding or upholstery.   Dental health: Discussed importance of regular tooth brushing, flossing, and dental visits.    NEXT PREVENTATIVE PHYSICAL DUE IN 1 YEAR. No follow-ups on file.

## 2020-11-30 ENCOUNTER — Other Ambulatory Visit (HOSPITAL_COMMUNITY)
Admission: RE | Admit: 2020-11-30 | Discharge: 2020-11-30 | Disposition: A | Discharge status: Home / Self Care | Payer: Medicare Other | Source: Ambulatory Visit | Attending: Nurse Practitioner | Admitting: Nurse Practitioner

## 2020-11-30 ENCOUNTER — Other Ambulatory Visit: Payer: Self-pay

## 2020-11-30 ENCOUNTER — Encounter: Payer: Self-pay | Admitting: Nurse Practitioner

## 2020-11-30 ENCOUNTER — Ambulatory Visit (INDEPENDENT_AMBULATORY_CARE_PROVIDER_SITE_OTHER): Payer: Medicare Other | Admitting: Nurse Practitioner

## 2020-11-30 VITALS — BP 98/49 | HR 63 | Temp 98.8°F | Ht 65.0 in | Wt 221.8 lb

## 2020-11-30 DIAGNOSIS — N183 Chronic kidney disease, stage 3 unspecified: Secondary | ICD-10-CM | POA: Diagnosis not present

## 2020-11-30 DIAGNOSIS — Z124 Encounter for screening for malignant neoplasm of cervix: Secondary | ICD-10-CM | POA: Diagnosis present

## 2020-11-30 DIAGNOSIS — F411 Generalized anxiety disorder: Secondary | ICD-10-CM

## 2020-11-30 DIAGNOSIS — Z0000 Encounter for general adult medical examination without abnormal findings: Secondary | ICD-10-CM | POA: Diagnosis not present

## 2020-11-30 DIAGNOSIS — R7301 Impaired fasting glucose: Secondary | ICD-10-CM

## 2020-11-30 DIAGNOSIS — E785 Hyperlipidemia, unspecified: Secondary | ICD-10-CM

## 2020-11-30 DIAGNOSIS — Z1231 Encounter for screening mammogram for malignant neoplasm of breast: Secondary | ICD-10-CM

## 2020-11-30 DIAGNOSIS — F322 Major depressive disorder, single episode, severe without psychotic features: Secondary | ICD-10-CM | POA: Diagnosis not present

## 2020-11-30 DIAGNOSIS — E038 Other specified hypothyroidism: Secondary | ICD-10-CM

## 2020-11-30 DIAGNOSIS — I10 Essential (primary) hypertension: Secondary | ICD-10-CM

## 2020-11-30 NOTE — Progress Notes (Deleted)
There were no vitals taken for this visit.   Subjective:    Patient ID: Kayla Wright, female    DOB: Jul 15, 1955, 65 y.o.   MRN: [id number]  HPI: Kayla Wright is a 65 y.o. female  No chief complaint on file.  HYPERTENSION / HYPERLIPIDEMIA Satisfied with current treatment? {Blank single:19197::"yes","no"} Duration of hypertension: {Blank single:19197::"chronic","months","years"} BP monitoring frequency: {Blank single:19197::"not checking","rarely","daily","weekly","monthly","a few times a day","a few times a week","a few times a month"} BP range:  BP medication side effects: {Blank single:19197::"yes","no"} Past BP meds: {Blank MZTAEWYB:74935::"LEZV","GJFTNBZXYD","SWVTVNRWCH/JSCBIPJRPZ","PSUGAYGE","FUWTKTCCEQ","FDVOUZHQUI/QNVV","YXAJLUNGBM (bystolic)","carvedilol","chlorthalidone","clonidine","diltiazem","exforge HCT","HCTZ","irbesartan (avapro)","labetalol","lisinopril","lisinopril-HCTZ","losartan (cozaar)","methyldopa","nifedipine","olmesartan (benicar)","olmesartan-HCTZ","quinapril","ramipril","spironalactone","tekturna","valsartan","valsartan-HCTZ","verapamil"} Duration of hyperlipidemia: {Blank single:19197::"chronic","months","years"} Cholesterol medication side effects: {Blank single:19197::"yes","no"} Cholesterol supplements: {Blank multiple:19196::"none","fish oil","niacin","red yeast rice"} Past cholesterol medications: {Blank multiple:19196::"none","atorvastain (lipitor)","lovastatin (mevacor)","pravastatin (pravachol)","rosuvastatin (crestor)","simvastatin (zocor)","vytorin","fenofibrate (tricor)","gemfibrozil","ezetimide (zetia)","niaspan","lovaza"} Medication compliance: {Blank single:19197::"excellent compliance","good compliance","fair compliance","poor compliance"} Aspirin: {Blank single:19197::"yes","no"} Recent stressors: {Blank single:19197::"yes","no"} Recurrent headaches: {Blank single:19197::"yes","no"} Visual changes: {Blank single:19197::"yes","no"} Palpitations:  {Blank single:19197::"yes","no"} Dyspnea: {Blank single:19197::"yes","no"} Chest pain: {Blank single:19197::"yes","no"} Lower extremity edema: {Blank single:19197::"yes","no"} Dizzy/lightheaded: {Blank single:19197::"yes","no"}  HYPOTHYROIDISM Thyroid control status:{Blank single:19197::"controlled","uncontrolled","better","worse","exacerbated","stable"} Satisfied with current treatment? {Blank single:19197::"yes","no"} Medication side effects: {Blank single:19197::"yes","no"} Medication compliance: {Blank single:19197::"excellent compliance","good compliance","fair compliance","poor compliance"} Etiology of hypothyroidism:  Recent dose adjustment:{Blank single:19197::"yes","no"} Fatigue: {Blank single:19197::"yes","no"} Cold intolerance: {Blank single:19197::"yes","no"} Heat intolerance: {Blank single:19197::"yes","no"} Weight gain: {Blank single:19197::"yes","no"} Weight loss: {Blank single:19197::"yes","no"} Constipation: {Blank single:19197::"yes","no"} Diarrhea/loose stools: {Blank single:19197::"yes","no"} Palpitations: {Blank single:19197::"yes","no"} Lower extremity edema: {Blank single:19197::"yes","no"} Anxiety/depressed mood: {Blank single:19197::"yes","no"}  CHRONIC KIDNEY DISEASE CKD status: {Blank single:19197::"controlled","uncontrolled","better","worse","exacerbated","stable"} Medications renally dose: {Blank single:19197::"yes","no"} Previous renal evaluation: {Blank single:19197::"yes","no"} Pneumovax:  {Blank single:19197::"Up to Date","Not up to Date","unknown"} Influenza Vaccine:  {Blank single:19197::"Up to Date","Not up to Date","unknown"}  DEPRESSION/ANXIETY  Relevant past medical, surgical, family and social history reviewed and updated as indicated. Interim medical history since our last visit reviewed. Allergies and medications reviewed and updated.  Review of Systems  Per HPI unless specifically indicated above     Objective:    There were no  vitals taken for this visit.  Wt Readings from Last 3 Encounters:  11/09/20 223 lb 14.4 oz (101.6 kg)  05/15/20 224 lb (101.6 kg)  01/15/20 229 lb 6.4 oz (104.1 kg)    Physical Exam  Results for orders placed or performed in visit on 05/15/20  Lipid panel  Result Value Ref Range   Cholesterol, Total 165 100 - 199 mg/dL   Triglycerides 166 (H) 0 - 149 mg/dL   HDL 44 >39 mg/dL   VLDL Cholesterol Cal 29 5 - 40 mg/dL   LDL Chol Calc (NIH) 92 0 - 99 mg/dL   Chol/HDL Ratio 3.8 0.0 - 4.4 ratio  Basic metabolic panel  Result Value Ref Range   Glucose 93 65 - 99 mg/dL   BUN 11 8 - 27 mg/dL   Creatinine, Ser 1.00 0.57 - 1.00 mg/dL   eGFR 63 >59 mL/min/1.73   BUN/Creatinine Ratio 11 (L) 12 - 28   Sodium 141 134 - 144 mmol/L   Potassium 4.3 3.5 - 5.2 mmol/L   Chloride 103 96 - 106 mmol/L   CO2 22 20 - 29 mmol/L   Calcium 9.7 8.7 - 10.3 mg/dL  Urinalysis, Routine w reflex microscopic  Result Value Ref Range   Specific Gravity, UA 1.010 1.005 - 1.030   pH, UA 5.5 5.0 - 7.5   Color, UA Yellow Yellow   Appearance Ur Clear Clear   Leukocytes,UA Negative Negative   Protein,UA Negative Negative/Trace   Glucose, UA Negative Negative   Ketones, UA Negative Negative   RBC, UA  Negative Negative   Bilirubin, UA Negative Negative   Urobilinogen, Ur 0.2 0.2 - 1.0 mg/dL   Nitrite, UA Negative Negative  HgB A1c  Result Value Ref Range   Hgb A1c MFr Bld 6.3 (H) 4.8 - 5.6 %   Est. average glucose Bld gHb Est-mCnc 134 mg/dL  TSH  Result Value Ref Range   TSH 3.790 0.450 - 4.500 uIU/mL      Assessment & Plan:   Problem List Items Addressed This Visit       Cardiovascular and Mediastinum   Essential hypertension - Primary     Endocrine   Hypothyroidism   IFG (impaired fasting glucose)     Genitourinary   CKD (chronic kidney disease) stage 3, GFR 30-59 ml/min (HCC)     Other   Severe depression (HCC)   Hyperlipidemia   Anxiety disorder     Follow up plan: No follow-ups on  file.

## 2020-11-30 NOTE — Assessment & Plan Note (Signed)
Chronic.  Controlled.  Continue with current medication regimen.  Labs ordered today.  Return to clinic in 6 months for reevaluation.  Call sooner if concerns arise.  ? ?

## 2020-11-30 NOTE — Assessment & Plan Note (Signed)
Not well controlled.  Denies psychiatry referral.  Denies SI.  Continue with current medication regimen.  Follow up if symptoms worsen or fail to improve.

## 2020-11-30 NOTE — Progress Notes (Signed)
 BP (!) 98/49   Pulse 63   Temp 98.8 F (37.1 C) (Oral)   Ht 5' 5" (1.651 m)   Wt 221 lb 12.8 oz (100.6 kg)   SpO2 97%   BMI 36.91 kg/m    Subjective:    Patient ID: Kayla Wright, female    DOB: 02/25/1955, 65 y.o.   MRN: [id number]  HPI: Kayla Wright is a 65 y.o. female presenting on 11/30/2020 for comprehensive medical examination. Current medical complaints include:none  She currently lives with: Menopausal Symptoms: no  HYPERTENSION / HYPERLIPIDEMIA Satisfied with current treatment? yes Duration of hypertension: years BP monitoring frequency: not checking BP range:  BP medication side effects: no Past BP meds: clonidine Duration of hyperlipidemia: years Cholesterol medication side effects: no Cholesterol supplements: none Past cholesterol medications: simvastatin (zocor) Medication compliance: excellent compliance Aspirin: no Recent stressors: no Recurrent headaches: no Visual changes: no Palpitations: no Dyspnea: no Chest pain: no Lower extremity edema: no Dizzy/lightheaded: no  HYPOTHYROIDISM Thyroid control status:controlled Satisfied with current treatment? no Medication side effects: no Medication compliance: excellent compliance Etiology of hypothyroidism:  Recent dose adjustment:no Fatigue: no Cold intolerance: no Heat intolerance: no Weight gain: no Weight loss: no Constipation: no Diarrhea/loose stools: no Palpitations: no Lower extremity edema: no Anxiety/depressed mood: no  CHRONIC KIDNEY DISEASE CKD status: controlled Medications renally dose: yes Previous renal evaluation: no Pneumovax:  Up to Date Influenza Vaccine:  Up to Date  DEPRESSION/ANXIETY Sometimes she feels like its good and others its not.  Has trouble with motivation some days.  Denies SI. She does see a therapist.    Depression Screen done today and results listed below:  Depression screen PHQ 2/9 11/30/2020 05/15/2020 01/15/2020 12/23/2019 08/12/2019  Decreased  Interest 2 1 2 0 2  Down, Depressed, Hopeless 2 1 2 0 2  PHQ - 2 Score 4 2 4 0 4  Altered sleeping 2 0 1 0 1  Tired, decreased energy 3 1 2 0 2  Change in appetite 2 0 1 0 2  Feeling bad or failure about yourself  2 0 1 1 2  Trouble concentrating 2 1 2 2 1  Moving slowly or fidgety/restless 1 0 0 0 1  Suicidal thoughts 0 0 0 0 0  PHQ-9 Score 16 4 11 3 13  Difficult doing work/chores Somewhat difficult Not difficult at all Not difficult at all Not difficult at all -  Some recent data might be hidden    The patient does not have a history of falls. I did complete a risk assessment for falls. A plan of care for falls was documented.   Past Medical History:  Past Medical History:  Diagnosis Date   Allergy    Common migraine    Depression    GERD (gastroesophageal reflux disease)    Hyperlipidemia    Hypertension    Insomnia    Menopausal symptom    Osteopenia    Thyroid disease     Surgical History:  Past Surgical History:  Procedure Laterality Date   ANKLE FRACTURE SURGERY  06/18/15   COLONOSCOPY WITH PROPOFOL N/A 08/31/2017   Procedure: COLONOSCOPY WITH PROPOFOL;  Surgeon: Vanga, Rohini Reddy, MD;  Location: [facility] ENDOSCOPY;  Service: Gastroenterology;  Laterality: N/A;   TUBAL LIGATION      Medications:  Current Outpatient Medications on File Prior to Visit  Medication Sig   albuterol (VENTOLIN HFA) 108 (90 Base) MCG/ACT inhaler USE 1 INHALATION BY MOUTH  EVERY 6 HOURS AS   NEEDED   Ascorbic Acid (VITAMIN C) 1000 MG tablet Take 1 tablet (1,000 mg total) by mouth daily.   Cholecalciferol (VITAMIN D3) 125 MCG (5000 UT) CAPS Take 1 capsule (5,000 Units total) by mouth daily.   cloNIDine (CATAPRES) 0.2 MG tablet TAKE 1 TABLET BY MOUTH 3  TIMES DAILY   Cyanocobalamin (VITAMIN B-12 PO) Take 1 tablet by mouth daily.   FLUoxetine (PROZAC) 20 MG capsule TAKE 1 CAPSULE BY MOUTH  DAILY WITH A 40 MG CAPSULE  FOR TOTAL 60 MG DAILY   FLUoxetine (PROZAC) 40 MG capsule TAKE 1 CAPSULE BY  MOUTH  DAILY   hydrOXYzine (ATARAX/VISTARIL) 25 MG tablet TAKE 1 TABLET BY MOUTH AT  BEDTIME AS NEEDED   levothyroxine (SYNTHROID) 50 MCG tablet TAKE 1 TABLET BY MOUTH  DAILY BEFORE BREAKFAST   oxyCODONE-acetaminophen (PERCOCET/ROXICET) 5-325 MG tablet Take 1 tablet by mouth 4 (four) times daily as needed.   QUEtiapine Fumarate (SEROQUEL XR) 150 MG 24 hr tablet TAKE 1 TABLET BY MOUTH AT  BEDTIME   simvastatin (ZOCOR) 40 MG tablet TAKE 1 TABLET BY MOUTH  DAILY   umeclidinium-vilanterol (ANORO ELLIPTA) 62.5-25 MCG/INH AEPB Inhale 1 puff into the lungs daily.   No current facility-administered medications on file prior to visit.    Allergies:  No Known Allergies  Social History:  Social History   Socioeconomic History   Marital status: Divorced    Spouse name: Not on file   Number of children: Not on file   Years of education: Not on file   Highest education level: Not on file  Occupational History   Occupation: retired  Tobacco Use   Smoking status: Never   Smokeless tobacco: Never  Vaping Use   Vaping Use: Never used  Substance and Sexual Activity   Alcohol use: No    Alcohol/week: 0.0 standard drinks   Drug use: Yes    Types: Oxycodone   Sexual activity: Not Currently    Birth control/protection: Surgical  Other Topics Concern   Not on file  Social History Narrative   Not on file   Social Determinants of Health   Financial Resource Strain: Low Risk    Difficulty of Paying Living Expenses: Not hard at all  Food Insecurity: No Food Insecurity   Worried About Running Out of Food in the Last Year: Never true   Ran Out of Food in the Last Year: Never true  Transportation Needs: No Transportation Needs   Lack of Transportation (Medical): No   Lack of Transportation (Non-Medical): No  Physical Activity: Sufficiently Active   Days of Exercise per Week: 4 days   Minutes of Exercise per Session: 60 min  Stress: No Stress Concern Present   Feeling of Stress : Not at all   Social Connections: Not on file  Intimate Partner Violence: Not on file   Social History   Tobacco Use  Smoking Status Never  Smokeless Tobacco Never   Social History   Substance and Sexual Activity  Alcohol Use No   Alcohol/week: 0.0 standard drinks    Family History:  Family History  Problem Relation Age of Onset   Diabetes Mother    Heart disease Mother    Cirrhosis Mother    Heart disease Father     Past medical history, surgical history, medications, allergies, family history and social history reviewed with patient today and changes made to appropriate areas of the chart.   Review of Systems  Eyes:  Negative for blurred vision and double   vision.  Respiratory:  Negative for shortness of breath.   Cardiovascular:  Negative for chest pain, palpitations and leg swelling.  Neurological:  Negative for dizziness and headaches.  All other ROS negative except what is listed above and in the HPI.      Objective:    BP (!) 98/49   Pulse 63   Temp 98.8 F (37.1 C) (Oral)   Ht 5' 5" (1.651 m)   Wt 221 lb 12.8 oz (100.6 kg)   SpO2 97%   BMI 36.91 kg/m   Wt Readings from Last 3 Encounters:  11/30/20 221 lb 12.8 oz (100.6 kg)  11/09/20 223 lb 14.4 oz (101.6 kg)  05/15/20 224 lb (101.6 kg)    Physical Exam Vitals and nursing note reviewed. Exam conducted with a chaperone present (Brittany Russell, CMA).  Constitutional:      General: She is awake. She is not in acute distress.    Appearance: She is well-developed. She is not ill-appearing.  HENT:     Head: Normocephalic and atraumatic.     Right Ear: Hearing, tympanic membrane, ear canal and external ear normal. No drainage.     Left Ear: Hearing, tympanic membrane, ear canal and external ear normal. No drainage.     Nose: Nose normal.     Right Sinus: No maxillary sinus tenderness or frontal sinus tenderness.     Left Sinus: No maxillary sinus tenderness or frontal sinus tenderness.     Mouth/Throat:     Mouth:  Mucous membranes are moist.     Pharynx: Oropharynx is clear. Uvula midline. No pharyngeal swelling, oropharyngeal exudate or posterior oropharyngeal erythema.  Eyes:     General: Lids are normal.        Right eye: No discharge.        Left eye: No discharge.     Extraocular Movements: Extraocular movements intact.     Conjunctiva/sclera: Conjunctivae normal.     Pupils: Pupils are equal, round, and reactive to light.     Visual Fields: Right eye visual fields normal and left eye visual fields normal.  Neck:     Thyroid: No thyromegaly.     Vascular: No carotid bruit.     Trachea: Trachea normal.  Cardiovascular:     Rate and Rhythm: Normal rate and regular rhythm.     Heart sounds: Normal heart sounds. No murmur heard.   No gallop.  Pulmonary:     Effort: Pulmonary effort is normal. No accessory muscle usage or respiratory distress.     Breath sounds: Normal breath sounds.  Chest:  Breasts:    Right: Normal.     Left: Normal.  Abdominal:     General: Bowel sounds are normal.     Palpations: Abdomen is soft. There is no hepatomegaly or splenomegaly.     Tenderness: There is no abdominal tenderness.  Genitourinary:    Vagina: Normal.     Cervix: Normal.     Adnexa: Right adnexa normal and left adnexa normal.  Musculoskeletal:        General: Normal range of motion.     Cervical back: Normal range of motion and neck supple.     Right lower leg: No edema.     Left lower leg: No edema.  Lymphadenopathy:     Head:     Right side of head: No submental, submandibular, tonsillar, preauricular or posterior auricular adenopathy.     Left side of head: No submental, submandibular, tonsillar, preauricular or posterior   auricular adenopathy.     Cervical: No cervical adenopathy.     Upper Body:     Right upper body: No supraclavicular, axillary or pectoral adenopathy.     Left upper body: No supraclavicular, axillary or pectoral adenopathy.  Skin:    General: Skin is warm and dry.      Capillary Refill: Capillary refill takes less than 2 seconds.     Findings: No rash.  Neurological:     Mental Status: She is alert and oriented to person, place, and time.     Gait: Gait is intact.     Deep Tendon Reflexes: Reflexes are normal and symmetric.     Reflex Scores:      Brachioradialis reflexes are 2+ on the right side and 2+ on the left side.      Patellar reflexes are 2+ on the right side and 2+ on the left side. Psychiatric:        Attention and Perception: Attention normal.        Mood and Affect: Mood normal.        Speech: Speech normal.        Behavior: Behavior normal. Behavior is cooperative.        Thought Content: Thought content normal.        Judgment: Judgment normal.    Results for orders placed or performed in visit on 05/15/20  Lipid panel  Result Value Ref Range   Cholesterol, Total 165 100 - 199 mg/dL   Triglycerides 166 (H) 0 - 149 mg/dL   HDL 44 >39 mg/dL   VLDL Cholesterol Cal 29 5 - 40 mg/dL   LDL Chol Calc (NIH) 92 0 - 99 mg/dL   Chol/HDL Ratio 3.8 0.0 - 4.4 ratio  Basic metabolic panel  Result Value Ref Range   Glucose 93 65 - 99 mg/dL   BUN 11 8 - 27 mg/dL   Creatinine, Ser 1.00 0.57 - 1.00 mg/dL   eGFR 63 >59 mL/min/1.73   BUN/Creatinine Ratio 11 (L) 12 - 28   Sodium 141 134 - 144 mmol/L   Potassium 4.3 3.5 - 5.2 mmol/L   Chloride 103 96 - 106 mmol/L   CO2 22 20 - 29 mmol/L   Calcium 9.7 8.7 - 10.3 mg/dL  Urinalysis, Routine w reflex microscopic  Result Value Ref Range   Specific Gravity, UA 1.010 1.005 - 1.030   pH, UA 5.5 5.0 - 7.5   Color, UA Yellow Yellow   Appearance Ur Clear Clear   Leukocytes,UA Negative Negative   Protein,UA Negative Negative/Trace   Glucose, UA Negative Negative   Ketones, UA Negative Negative   RBC, UA Negative Negative   Bilirubin, UA Negative Negative   Urobilinogen, Ur 0.2 0.2 - 1.0 mg/dL   Nitrite, UA Negative Negative  HgB A1c  Result Value Ref Range   Hgb A1c MFr Bld 6.3 (H) 4.8 - 5.6 %    Est. average glucose Bld gHb Est-mCnc 134 mg/dL  TSH  Result Value Ref Range   TSH 3.790 0.450 - 4.500 uIU/mL      Assessment & Plan:   Problem List Items Addressed This Visit       Cardiovascular and Mediastinum   Essential hypertension    Chronic.  Controlled.  Continue with current medication regimen.  Labs ordered today.  Return to clinic in 6 months for reevaluation.  Call sooner if concerns arise.        Relevant Orders   Comp Met (CMET)       Endocrine   Hypothyroidism    Chronic.  Controlled.  Continue with current medication regimen on Levothyroxine 50mcg.  Labs ordered today.  Return to clinic in 6 months for reevaluation.  Call sooner if concerns arise.        Relevant Orders   TSH   T4, free   IFG (impaired fasting glucose)    Chronic.  Controlled.  Continue with current medication regimen.  Labs ordered today.  Return to clinic in 6 months for reevaluation.  Call sooner if concerns arise.        Relevant Orders   HgB A1c     Genitourinary   CKD (chronic kidney disease) stage 3, GFR 30-59 ml/min (HCC)    Chronic.  Controlled.  Continue with current medication regimen.  Labs ordered today.  Return to clinic in 6 months for reevaluation.  Call sooner if concerns arise.        Relevant Orders   Comp Met (CMET)     Other   Severe depression (HCC)    Not well controlled.  Denies psychiatry referral.  Denies SI.  Continue with current medication regimen.  Follow up if symptoms worsen or fail to improve.      Hyperlipidemia    Chronic.  Controlled.  Continue with current medication regimen.  Labs ordered today.  Return to clinic in 6 months for reevaluation.  Call sooner if concerns arise.        Relevant Orders   Lipid Profile   Anxiety disorder    Not well controlled.  Denies psychiatry referral.  Denies SI.  Continue with current medication regimen.  Follow up if symptoms worsen or fail to improve.      Other Visit Diagnoses     Annual  physical exam    -  Primary   Health maintenance reviewed during visit. Pneumonia vaccine given. Labs ordered today. PAP done today.    Encounter for screening mammogram for malignant neoplasm of breast       Relevant Orders   MM Digital Screening   Screening for cervical cancer       PAP obtained in normal fashion. Patient tolerated obtaining of specimen without complication. Escorted by Brittany Russell, CMA.   Relevant Orders   Cytology - PAP        Follow up plan: Return in about 3 months (around 03/02/2021) for HTN, HLD, DM2 FU.   LABORATORY TESTING:  - Pap smear: pap done  IMMUNIZATIONS:   - Tdap: Tetanus vaccination status reviewed: last tetanus booster within 10 years. - Influenza: Up to date - Pneumovax: Administered today - Prevnar: Not applicable - HPV: Not applicable - Zostavax vaccine:  states she has had it  SCREENING: -Mammogram: Ordered today  - Colonoscopy: Up to date  - Bone Density: Not applicable  -Hearing Test: Not applicable  -Spirometry: Not applicable   PATIENT COUNSELING:   Advised to take 1 mg of folate supplement per day if capable of pregnancy.   Sexuality: Discussed sexually transmitted diseases, partner selection, use of condoms, avoidance of unintended pregnancy  and contraceptive alternatives.   Advised to avoid cigarette smoking.  I discussed with the patient that most people either abstain from alcohol or drink within safe limits (<=14/week and <=4 drinks/occasion for males, <=7/weeks and <= 3 drinks/occasion for females) and that the risk for alcohol disorders and other health effects rises proportionally with the number of drinks per week and how often a drinker exceeds daily limits.  Discussed cessation/primary prevention of   drug use and availability of treatment for abuse.   Diet: Encouraged to adjust caloric intake to maintain  or achieve ideal body weight, to reduce intake of dietary saturated fat and total fat, to limit sodium  intake by avoiding high sodium foods and not adding table salt, and to maintain adequate dietary potassium and calcium preferably from fresh fruits, vegetables, and low-fat dairy products.    stressed the importance of regular exercise  Injury prevention: Discussed safety belts, safety helmets, smoke detector, smoking near bedding or upholstery.   Dental health: Discussed importance of regular tooth brushing, flossing, and dental visits.    NEXT PREVENTATIVE PHYSICAL DUE IN 1 YEAR. Return in about 3 months (around 03/02/2021) for HTN, HLD, DM2 FU.

## 2020-11-30 NOTE — Assessment & Plan Note (Signed)
Chronic.  Controlled.  Continue with current medication regimen on Levothyroxine .  Labs ordered today.  Return to clinic in 6 months for reevaluation.  Call sooner if concerns arise.

## 2020-12-01 LAB — T4, FREE: Free T4: 1.12 ng/dL (ref 0.82–1.77)

## 2020-12-01 LAB — COMPREHENSIVE METABOLIC PANEL
ALT: 21 IU/L (ref 0–32)
AST: 21 IU/L (ref 0–40)
Albumin/Globulin Ratio: 2 (ref 1.2–2.2)
Albumin: 4.7 g/dL (ref 3.8–4.8)
Alkaline Phosphatase: 88 IU/L (ref 44–121)
BUN/Creatinine Ratio: 10 — ABNORMAL LOW (ref 12–28)
BUN: 11 mg/dL (ref 8–27)
Bilirubin Total: 1.2 mg/dL (ref 0.0–1.2)
CO2: 22 mmol/L (ref 20–29)
Calcium: 9.3 mg/dL (ref 8.7–10.3)
Chloride: 102 mmol/L (ref 96–106)
Creatinine, Ser: 1.11 mg/dL — ABNORMAL HIGH (ref 0.57–1.00)
Globulin, Total: 2.3 g/dL (ref 1.5–4.5)
Glucose: 83 mg/dL (ref 70–99)
Potassium: 4.1 mmol/L (ref 3.5–5.2)
Sodium: 138 mmol/L (ref 134–144)
Total Protein: 7 g/dL (ref 6.0–8.5)
eGFR: 56 mL/min/{1.73_m2} — ABNORMAL LOW (ref >59)

## 2020-12-01 LAB — LIPID PANEL
Chol/HDL Ratio: 3.9 ratio (ref 0.0–4.4)
Cholesterol, Total: 158 mg/dL (ref 100–199)
HDL: 41 mg/dL (ref >39)
LDL Chol Calc (NIH): 93 mg/dL (ref 0–99)
Triglycerides: 132 mg/dL (ref 0–149)
VLDL Cholesterol Cal: 24 mg/dL (ref 5–40)

## 2020-12-01 LAB — HEMOGLOBIN A1C
Est. average glucose Bld gHb Est-mCnc: 126 mg/dL
Hgb A1c MFr Bld: 6 % — ABNORMAL HIGH (ref 4.8–5.6)

## 2020-12-01 LAB — TSH: TSH: 3.15 u[IU]/mL (ref 0.450–4.500)

## 2020-12-01 NOTE — Progress Notes (Signed)
Please let patient know that overall her lab work looks good.  Kidney function is slightly declined, recommend she increase her water intake and avoid NSAIDS such as ibuprofen, motrin, or aleve.  Other lab work looks good.  Follow up as discussed.

## 2020-12-03 LAB — CYTOLOGY - PAP
Diagnosis: NEGATIVE
Diagnosis: REACTIVE

## 2020-12-04 MED ORDER — METRONIDAZOLE 500 MG PO TABS: 500.0000 mg | ORAL_TABLET | Freq: Two times a day (BID) | ORAL | 0 refills | Status: AC

## 2020-12-04 NOTE — Progress Notes (Signed)
Please let patient know that her PAP showed that she had a lot of inflammation.  This can be common with post menopausal women.  There was also evidence of Trichomonas.  I have sent her flagyl to the pharmacy to treat this.  Please make sure she knows that drinking alcohol while taking flagyl can make her sick so make sure to avoid it until while taking the medication.  Please let me know if she has any questions.

## 2020-12-04 NOTE — Addendum Note (Signed)
Addended by: Larae Grooms on: 12/04/2020 02:08 PM   Modules accepted: Orders

## 2020-12-09 ENCOUNTER — Other Ambulatory Visit: Payer: Self-pay | Admitting: Nurse Practitioner

## 2020-12-10 NOTE — Telephone Encounter (Signed)
Requested Prescriptions  Pending Prescriptions Disp Refills  . FLUoxetine (PROZAC) 40 MG capsule [Pharmacy Med Name: FLUOXETINE  40MG   CAP] 90 capsule 1    Sig: TAKE 1 CAPSULE BY MOUTH  DAILY     Psychiatry:  Antidepressants - SSRI Passed - 12/09/2020 11:01 PM      Passed - Completed PHQ-2 or PHQ-9 in the last 360 days      Passed - Valid encounter within last 6 months    Recent Outpatient Visits          1 week ago Annual physical exam   [facility] [place] [facility], NP   6 months ago Essential hypertension   [facility], NP   11 months ago Severe depression [facility])   [facility], NP   1 year ago Chronic anxiety   [facility], Particia Nearing   1 year ago Chronic anxiety   [facility] [place], [place], Salley Hews      Future Appointments            In 1 week  [facility]   In 2 months [facility], NP [facility], [facility]

## 2020-12-23 ENCOUNTER — Ambulatory Visit (INDEPENDENT_AMBULATORY_CARE_PROVIDER_SITE_OTHER): Payer: Medicare Other | Admitting: *Deleted

## 2020-12-23 DIAGNOSIS — Z0000 Encounter for general adult medical examination without abnormal findings: Secondary | ICD-10-CM

## 2020-12-23 NOTE — Progress Notes (Signed)
Subjective:   Kayla Wright is a 65 y.o. female who presents for Medicare Annual (Subsequent) preventive examination.  I connected with  [name] on 12/23/20 by a telephone enabled telemedicine application and verified that I am speaking with the correct person using two identifiers.   I discussed the limitations of evaluation and management by telemedicine. The patient expressed understanding and agreed to proceed.  Patient location: home  Provider location: Tele-health  not in clinic    Review of Systems     Cardiac Risk Factors include: advanced age (>87men, >77 women);hypertension;obesity (BMI >30kg/m2);sedentary lifestyle     Objective:    Today's Vitals   There is no height or weight on file to calculate BMI.  Advanced Directives 12/23/2020 12/23/2019 08/31/2017 12/06/2016 11/09/2016 10/10/2016 08/29/2016  Does Patient Have a Medical Advance Directive? No Yes Yes Yes Yes Yes Yes  Type of Advance Directive - Healthcare Power of [place];Living will Healthcare Power of [place];Living will Living will Living will Living will Living will  Does patient want to make changes to medical advance directive? - - - (No Data) - - -  Copy of Healthcare Power of Attorney in Chart? - No - copy requested - - - - -  Would patient like information on creating a medical advance directive? No - Patient declined - - - - - -  Some encounter information is confidential and restricted. Go to Review Flowsheets activity to see all data.    Current Medications (verified) Outpatient Encounter Medications as of 12/23/2020  Medication Sig   albuterol (VENTOLIN HFA) 108 (90 Base) MCG/ACT inhaler USE 1 INHALATION BY MOUTH  EVERY 6 HOURS AS NEEDED   Ascorbic Acid (VITAMIN C) 1000 MG tablet Take 1 tablet (1,000 mg total) by mouth daily.   Cholecalciferol (VITAMIN D3) 125 MCG (5000 UT) CAPS Take 1 capsule (5,000 Units total) by mouth daily.   cloNIDine (CATAPRES) 0.2 MG tablet TAKE 1 TABLET BY MOUTH 3   TIMES DAILY   Cyanocobalamin (VITAMIN B-12 PO) Take 1 tablet by mouth daily.   FLUoxetine (PROZAC) 20 MG capsule TAKE 1 CAPSULE BY MOUTH  DAILY WITH A 40 MG CAPSULE  FOR TOTAL 60 MG DAILY   FLUoxetine (PROZAC) 40 MG capsule TAKE 1 CAPSULE BY MOUTH  DAILY   hydrOXYzine (ATARAX/VISTARIL) 25 MG tablet TAKE 1 TABLET BY MOUTH AT  BEDTIME AS NEEDED   levothyroxine (SYNTHROID) 50 MCG tablet TAKE 1 TABLET BY MOUTH  DAILY BEFORE BREAKFAST   oxyCODONE-acetaminophen (PERCOCET/ROXICET) 5-325 MG tablet Take 1 tablet by mouth 4 (four) times daily as needed.   QUEtiapine Fumarate (SEROQUEL XR) 150 MG 24 hr tablet TAKE 1 TABLET BY MOUTH AT  BEDTIME   simvastatin (ZOCOR) 40 MG tablet TAKE 1 TABLET BY MOUTH  DAILY   umeclidinium-vilanterol (ANORO ELLIPTA) 62.5-25 MCG/INH AEPB Inhale 1 puff into the lungs daily.   No facility-administered encounter medications on file as of 12/23/2020.    Allergies (verified) Patient has no known allergies.   History: Past Medical History:  Diagnosis Date   Allergy    Common migraine    Depression    GERD (gastroesophageal reflux disease)    Hyperlipidemia    Hypertension    Insomnia    Menopausal symptom    Osteopenia    Thyroid disease    Past Surgical History:  Procedure Laterality Date   ANKLE FRACTURE SURGERY  06/18/15   COLONOSCOPY WITH PROPOFOL N/A 08/31/2017   Procedure: COLONOSCOPY WITH PROPOFOL;  Surgeon: Lannette Donath  Betti Cruz, MD;  Location: [facility] ENDOSCOPY;  Service: Gastroenterology;  Laterality: N/A;   TUBAL LIGATION     Family History  Problem Relation Age of Onset   Diabetes Mother    Heart disease Mother    Cirrhosis Mother    Heart disease Father    Social History   Socioeconomic History   Marital status: Divorced    Spouse name: Not on file   Number of children: Not on file   Years of education: Not on file   Highest education level: Not on file  Occupational History   Occupation: retired  Tobacco Use   Smoking status: Never    Smokeless tobacco: Never  Vaping Use   Vaping Use: Never used  Substance and Sexual Activity   Alcohol use: No    Alcohol/week: 0.0 standard drinks   Drug use: Yes    Types: Oxycodone   Sexual activity: Not Currently    Birth control/protection: Surgical  Other Topics Concern   Not on file  Social History Narrative   Not on file   Social Determinants of Health   Financial Resource Strain: Low Risk    Difficulty of Paying Living Expenses: Not hard at all  Food Insecurity: Not on file  Transportation Needs: Not on file  Physical Activity: Inactive   Days of Exercise per Week: 0 days   Minutes of Exercise per Session: 0 min  Stress: No Stress Concern Present   Feeling of Stress : Not at all  Social Connections: Moderately Integrated   Frequency of Communication with Friends and Family: More than three times a week   Frequency of Social Gatherings with Friends and Family: More than three times a week   Attends Religious Services: More than 4 times per year   Active Member of [employer] or Organizations: No   Attends Engineer, structural: Never   Marital Status: Married    Tobacco Counseling Counseling given: Not Answered   Clinical Intake:  Pre-visit preparation completed: Yes  Pain : No/denies pain     Nutritional Risks: None Diabetes: No  How often do you need to have someone help you when you read instructions, pamphlets, or other written materials from your doctor or pharmacy?: 1 - Never  Diabetic?  no  Interpreter Needed?: No  Information entered by :: Remi Haggard LPN   Activities of Daily Living In your present state of health, do you have any difficulty performing the following activities: 12/23/2020 11/30/2020  Hearing? N N  Vision? N N  Difficulty concentrating or making decisions? N N  Walking or climbing stairs? Y N  Dressing or bathing? - N  Doing errands, shopping? N N  Preparing Food and eating ? N -  Using the Toilet? N -  In the past  six months, have you accidently leaked urine? N -  Do you have problems with loss of bowel control? N -  Managing your Medications? N -  Managing your Finances? N -  Housekeeping or managing your Housekeeping? N -  Some recent data might be hidden    Patient Care Team: Larae Grooms, NP as PCP - General Arlana Pouch Megan Mans, RN as Case Manager (General Practice)  Indicate any recent Medical Services you may have received from other than [facility] providers in the past year (date may be approximate).     Assessment:   This is a routine wellness examination for Briah.  Hearing/Vision screen Hearing Screening - Comments:: No trouble hearing Vision Screening - Comments::  Dr. Clydene Pugh Up to date  Dietary issues and exercise activities discussed: Current Exercise Habits: The patient does not participate in regular exercise at present   Goals Addressed             This Visit's Progress    Patient Stated       No goals        Depression Screen PHQ 2/9 Scores 12/23/2020 11/30/2020 05/15/2020 01/15/2020 12/23/2019 08/12/2019 08/01/2018  PHQ - 2 Score 0 4 3  PHQ- 9 Score Fall Risk Fall Risk  12/23/2020 11/30/2020 01/15/2020 12/23/2019 08/12/2019  Falls in the past year? 0 0 0 0 0  Number falls in past yr: 0 0 0 - 0  Injury with Fall? 0 0 0 - 0  Risk Factor Category  - - - - -  Risk for fall due to : - No Fall Risks - Medication side effect -  Follow up Falls evaluation completed;Falls prevention discussed Falls evaluation completed - Falls evaluation completed;Education provided;Falls prevention discussed -  Comment - - - - -    FALL RISK PREVENTION PERTAINING TO THE HOME:  Any stairs in or around the home? No  If so, are there any without handrails? No  Home free of loose throw rugs in walkways, pet beds, electrical cords, etc? Yes  Adequate lighting in your home to reduce risk of falls? Yes   ASSISTIVE DEVICES UTILIZED TO PREVENT FALLS:  Life alert?  No  Use of a cane, walker or w/c? Yes  Grab bars in the bathroom? Yes  Shower chair or bench in shower? Yes  Elevated toilet seat or a handicapped toilet? No   TIMED UP AND GO:  Was the test performed? No .    Cognitive Function:     6CIT Screen 12/23/2020 12/23/2019  What Year? 0 points 0 points  What month? 0 points 0 points  What time? 0 points 0 points  Count back from 20 0 points 0 points  Months in reverse 4 points 4 points  Repeat phrase 0 points 6 points  Total Score 4 10    Immunizations Immunization History  Administered Date(s) Administered   Influenza,inj,Quad PF,6+ Mos 11/04/2014, 11/02/2015, 11/15/2016, 10/23/2017, 11/21/2018, 11/27/2019   Influenza-Unspecified 11/06/2020   PFIZER(Purple Top)SARS-COV-2 Vaccination 10/07/2019, 10/28/2019   Td 04/23/2004   Tdap 01/27/2016    TDAP status: Up to date  Flu Vaccine status: Up to date    Covid-19 vaccine status: Information provided on how to obtain vaccines.   Qualifies for Shingles Vaccine? Yes   Zostavax completed No   Shingrix Completed?: No.    Education has been provided regarding the importance of this vaccine. Patient has been advised to call insurance company to determine out of pocket expense if they have not yet received this vaccine. Advised may also receive vaccine at local pharmacy or Health Dept. Verbalized acceptance and understanding.  Screening Tests Health Maintenance  Topic Date Due   Pneumococcal Vaccine 32-87 Years old (1 - PCV) Never done   Zoster Vaccines- Shingrix (1 of 2) Never done   COVID-19 Vaccine (3 - Pfizer risk series) 11/25/2019   MAMMOGRAM  10/30/2020   Hepatitis C Screening  01/14/2021 (Originally 03/27/1973)   HIV Screening  01/14/2021 (Originally 03/27/1970)   PAP SMEAR-Modifier  11/30/2025   TETANUS/TDAP  01/26/2026   COLONOSCOPY (Pts 45-72yrs Insurance coverage will need to be confirmed)  09/01/2027   INFLUENZA VACCINE  Completed   HPV VACCINES  Aged Out     Health Maintenance  Health Maintenance Due  Topic Date Due   Pneumococcal Vaccine 61-45 Years old (1 - PCV) Never done   Zoster Vaccines- Shingrix (1 of 2) Never done   COVID-19 Vaccine (3 - Pfizer risk series) 11/25/2019   MAMMOGRAM  10/30/2020    Colorectal cancer screening: Type of screening: Colonoscopy. Completed 2019. Repeat every 10 years  Mammogram status: Ordered  . Pt provided with contact info and advised to call to schedule appt.     Lung Cancer Screening: (Low Dose CT Chest recommended if Age 64-80 years, 30 pack-year currently smoking OR have quit w/in 15years.) does not qualify.   Lung Cancer Screening Referral:   Additional Screening:  Hepatitis C Screening: does qualify;   Vision Screening: Recommended annual ophthalmology exams for early detection of glaucoma and other disorders of the eye. Is the patient up to date with their annual eye exam?  Yes  Who is the provider or what is the name of the office in which the patient attends annual eye exams? [facility] If pt is not established with a provider, would they like to be referred to a provider to establish care? No .   Dental Screening: Recommended annual dental exams for proper oral hygiene  Community Resource Referral / Chronic Care Management: CRR required this visit?  No   CCM required this visit?  No      Plan:     I have personally reviewed and noted the following in the patient's chart:   Medical and social history Use of alcohol, tobacco or illicit drugs  Current medications and supplements including opioid prescriptions.  Functional ability and status Nutritional status Physical activity Advanced directives List of other physicians Hospitalizations, surgeries, and ER visits in previous 12 months Vitals Screenings to include cognitive, depression, and falls Referrals and appointments  In addition, I have reviewed and discussed with patient certain preventive protocols, quality  metrics, and best practice recommendations. A written personalized care plan for preventive services as well as general preventive health recommendations were provided to patient.     Remi Haggard, LPN   [id number]   Nurse Notes:

## 2020-12-27 ENCOUNTER — Other Ambulatory Visit: Payer: Self-pay | Admitting: Nurse Practitioner

## 2020-12-28 NOTE — Telephone Encounter (Signed)
Requested medication (s) are due for refill today: yes  Requested medication (s) are on the active medication list: yes  Last refill:  10/21/20 #90 0 refills   Future visit scheduled: yes in 2 months   Notes to clinic:  requesting 1 year supply      Requested Prescriptions  Pending Prescriptions Disp Refills   FLUoxetine (PROZAC) 20 MG capsule [Pharmacy Med Name: FLUoxetine HCl 20 MG Oral Capsule] 90 capsule 3    Sig: TAKE 1 CAPSULE BY MOUTH  DAILY WITH A 40 MG CAPSULE  FOR TOTAL 60 MG DAILY     Psychiatry:  Antidepressants - SSRI Passed - 12/27/2020 10:26 PM      Passed - Completed PHQ-2 or PHQ-9 in the last 360 days      Passed - Valid encounter within last 6 months    Recent Outpatient Visits           4 weeks ago Annual physical exam   [facility] Larae Grooms, NP   7 months ago Essential hypertension   [facility] Larae Grooms, NP   11 months ago Severe depression [facility])   [facility] [place], Corrie Dandy T, NP   1 year ago Chronic anxiety   [facility] N[facility], [place]   1 year ago Chronic anxiety   [facility] [place], Salley Hews, [place]       Future Appointments             In 2 months Larae Grooms, NP [facility], [facility]

## 2021-02-08 LAB — HM HEPATITIS C SCREENING LAB: HM Hepatitis Screen: NEGATIVE

## 2021-02-10 ENCOUNTER — Other Ambulatory Visit: Payer: Self-pay | Admitting: Nurse Practitioner

## 2021-02-10 NOTE — Telephone Encounter (Signed)
Requested Prescriptions  Pending Prescriptions Disp Refills   cloNIDine (CATAPRES) 0.2 MG tablet [Pharmacy Med Name: cloNIDine HCl 0.2 MG Oral Tablet] 270 tablet 3    Sig: TAKE 1 TABLET BY MOUTH 3  TIMES DAILY     Cardiovascular:  Alpha-2 Agonists Failed - 02/10/2021  5:10 PM      Failed - Last BP in normal range    BP Readings from Last 1 Encounters:  11/30/20 (!) 98/49         Passed - Last Heart Rate in normal range    Pulse Readings from Last 1 Encounters:  11/30/20 63         Passed - Valid encounter within last 6 months    Recent Outpatient Visits          2 months ago Annual physical exam   [facility] Jon Billings, NP   9 months ago Essential hypertension   [facility] Jon Billings, NP   1 year ago Severe depression [facility])   [place], Jolene T, NP   1 year ago Chronic anxiety   [facility] Volney American, [place]   1 year ago Chronic anxiety   [place], Lilia Argue, [place]      Future Appointments            In 3 weeks Jon Billings, NP [facility], [place]

## 2021-02-10 NOTE — Telephone Encounter (Signed)
Requested Prescriptions  Pending Prescriptions Disp Refills   hydrOXYzine (ATARAX) 25 MG tablet [Pharmacy Med Name: hydrOXYzine HCl 25 MG Oral Tablet] 90 tablet 0    Sig: TAKE 1 TABLET BY MOUTH AT  BEDTIME AS NEEDED     Ear, Nose, and Throat:  Antihistamines Passed - 02/10/2021  5:10 PM      Passed - Valid encounter within last 12 months    Recent Outpatient Visits          2 months ago Annual physical exam   [facility] Larae Grooms, NP   9 months ago Essential hypertension   [facility] Larae Grooms, NP   1 year ago Severe depression (HCC)   [facility] Marjie Skiff, NP   1 year ago Chronic anxiety   [facility] Particia Nearing, [place]   1 year ago Chronic anxiety   [facility] [place], Salley Hews, [place]      Future Appointments            In 3 weeks Larae Grooms, NP [facility], [facility]            FLUoxetine (PROZAC) 20 MG capsule [Pharmacy Med Name: FLUoxetine HCl 20 MG Oral Capsule] 90 capsule 3    Sig: TAKE 1 CAPSULE BY MOUTH  DAILY WITH A 40 MG CAPSULE  FOR TOTAL 60 MG DAILY     Psychiatry:  Antidepressants - SSRI Passed - 02/10/2021  5:10 PM      Passed - Completed PHQ-2 or PHQ-9 in the last 360 days      Passed - Valid encounter within last 6 months    Recent Outpatient Visits          2 months ago Annual physical exam   [facility] Larae Grooms, NP   9 months ago Essential hypertension   [facility] Larae Grooms, NP   1 year ago Severe depression [facility])   [facility] Marjie Skiff, NP   1 year ago Chronic anxiety   [facility] Particia Nearing, [place]   1 year ago Chronic anxiety   [facility] [place], Salley Hews, [place]      Future Appointments            In 3 weeks Larae Grooms, NP [facility], [facility]

## 2021-03-03 ENCOUNTER — Ambulatory Visit: Payer: Medicare Other | Admitting: Nurse Practitioner

## 2021-03-22 LAB — HEPATIC FUNCTION PANEL
ALT: 8 U/L (ref 7–35)
AST: 15 (ref 13–35)
Alkaline Phosphatase: 116 (ref 25–125)

## 2021-03-22 LAB — CBC AND DIFFERENTIAL
HCT: 35 — AB (ref 36–46)
Hemoglobin: 11.4 — AB (ref 12.0–16.0)
Platelets: 311 10*3/uL (ref 150–400)
WBC: 6.5

## 2021-03-22 LAB — BASIC METABOLIC PANEL WITH GFR
BUN: 13 (ref 4–21)
CO2: 25 — AB (ref 13–22)
Chloride: 104 (ref 99–108)
Creatinine: 0.9 (ref 0.5–1.1)
Glucose: 112
Potassium: 4.5 meq/L (ref 3.5–5.1)
Sodium: 140 (ref 137–147)

## 2021-03-22 LAB — COMPREHENSIVE METABOLIC PANEL WITH GFR
Albumin: 3.7 (ref 3.5–5.0)
Calcium: 9.3 (ref 8.7–10.7)
Globulin: 4
eGFR: 63

## 2021-03-22 LAB — CBC: RBC: 4.1 (ref 3.87–5.11)

## 2021-04-10 ENCOUNTER — Other Ambulatory Visit: Payer: Self-pay | Admitting: Nurse Practitioner

## 2021-04-12 NOTE — Telephone Encounter (Signed)
Requested medications are due for refill today.  yes ? ?Requested medications are on the active medications list.  yes ? ?Last refill. Statin & 05/24/2021 #90 3 refills. Seroquel and synthroid refilled 05/11/2020 #90 3 refills ? ?Future visit scheduled.   no ? ?Notes to clinic.  Pr note from [facility] of 11/30/2020 pt was to follow up 03/02/2021 - pt cancelled appointment. Please advise. ? ? ? ?Requested Prescriptions  ?Pending Prescriptions Disp Refills  ? simvastatin (ZOCOR) 40 MG tablet [Pharmacy Med Name: Simvastatin 40 MG Oral Tablet] 90 tablet 3  ?  Sig: TAKE 1 TABLET BY MOUTH  DAILY  ?  ? Cardiovascular:  Antilipid - Statins Failed - 04/10/2021 10:48 PM  ?  ?  Failed - Lipid Panel in normal range within the last 12 months  ?  Cholesterol, Total  ?Date Value Ref Range Status  ?11/30/2020 158 100 - 199 mg/dL Final  ? ?LDL Chol Calc (NIH)  ?Date Value Ref Range Status  ?11/30/2020 93 0 - 99 mg/dL Final  ? ?HDL  ?Date Value Ref Range Status  ?11/30/2020 41 >39 mg/dL Final  ? ?Triglycerides  ?Date Value Ref Range Status  ?11/30/2020 132 0 - 149 mg/dL Final  ? ?  ?  ?  Passed - Patient is not pregnant  ?  ?  Passed - Valid encounter within last 12 months  ?  Recent Outpatient Visits   ? ?      ? 4 months ago Annual physical exam  ? [place], NP  ? 11 months ago Essential hypertension  ? [place], NP  ? 1 year ago Severe depression ([place])  ? [place], Henrine Screws T, NP  ? 1 year ago Chronic anxiety  ? [place], [place]  ? 1 year ago Chronic anxiety  ? [place], [place], [place]  ? ?  ?  ? ?  ?  ?  ? QUEtiapine Fumarate (SEROQUEL XR) 150 MG 24 hr tablet [Pharmacy Med Name: QUETIAPINE  150MG  TAB  XR] 90 tablet 3  ?  Sig: TAKE 1 TABLET BY MOUTH AT  BEDTIME  ?  ? Not Delegated - Psychiatry:  Antipsychotics - Second Generation (Atypical) - quetiapine Failed - 04/10/2021 10:48 PM  ?  ?   Failed - This refill cannot be delegated  ?  ?  Failed - Last BP in normal range  ?  BP Readings from Last 1 Encounters:  ?11/30/20 (!) 98/49  ?  ?  ?  ?  Failed - Lipid Panel in normal range within the last 12 months  ?  Cholesterol, Total  ?Date Value Ref Range Status  ?11/30/2020 158 100 - 199 mg/dL Final  ? ?LDL Chol Calc (NIH)  ?Date Value Ref Range Status  ?11/30/2020 93 0 - 99 mg/dL Final  ? ?HDL  ?Date Value Ref Range Status  ?11/30/2020 41 >39 mg/dL Final  ? ?Triglycerides  ?Date Value Ref Range Status  ?11/30/2020 132 0 - 149 mg/dL Final  ? ?  ?  ?  Failed - CBC within normal limits and completed in the last 12 months  ?  WBC  ?Date Value Ref Range Status  ?02/26/2018 9.2 3.4 - 10.8 x10E3/uL Final  ?09/18/2017 8.8 3.6 - 11.0 K/uL Final  ? ?RBC  ?Date Value Ref Range Status  ?02/26/2018 4.45 3.77 - 5.28 x10E6/uL Final  ?09/18/2017 4.16 3.80 - 5.20 MIL/uL Final  ? ?  Hemoglobin  ?Date Value Ref Range Status  ?02/26/2018 13.6 11.1 - 15.9 g/dL Final  ? ?Hematocrit  ?Date Value Ref Range Status  ?02/26/2018 40.8 34.0 - 46.6 % Final  ? ?MCHC  ?Date Value Ref Range Status  ?02/26/2018 33.3 31.5 - 35.7 g/dL Final  ?09/18/2017 34.2 32.0 - 36.0 g/dL Final  ? ?MCH  ?Date Value Ref Range Status  ?02/26/2018 30.6 26.6 - 33.0 pg Final  ?09/18/2017 31.6 26.0 - 34.0 pg Final  ? ?MCV  ?Date Value Ref Range Status  ?02/26/2018 92 79 - 97 fL Final  ? ?No results found for: PLTCOUNTKUC, LABPLAT, [place] ?RDW  ?Date Value Ref Range Status  ?02/26/2018 14.3 11.7 - 15.4 % Final  ? ?  ?  ?  Passed - TSH in normal range and within 360 days  ?  TSH  ?Date Value Ref Range Status  ?11/30/2020 3.150 0.450 - 4.500 uIU/mL Final  ?  ?  ?  ?  Passed - Completed PHQ-2 or PHQ-9 in the last 360 days  ?  ?  Passed - Last Heart Rate in normal range  ?  Pulse Readings from Last 1 Encounters:  ?11/30/20 63  ?  ?  ?  ?  Passed - Valid encounter within last 6 months  ?  Recent Outpatient Visits   ? ?      ? 4 months ago Annual physical exam  ?  [place], NP  ? 11 months ago Essential hypertension  ? [place], NP  ? 1 year ago Severe depression ([place])  ? [place], Henrine Screws T, NP  ? 1 year ago Chronic anxiety  ? [place], [place]  ? 1 year ago Chronic anxiety  ? [place], [place], [place]  ? ?  ?  ? ?  ?  ?  Passed - CMP within normal limits and completed in the last 12 months  ?  Albumin  ?Date Value Ref Range Status  ?11/30/2020 4.7 3.8 - 4.8 g/dL Final  ? ?Alkaline Phosphatase  ?Date Value Ref Range Status  ?11/30/2020 88 44 - 121 IU/L Final  ? ?ALT  ?Date Value Ref Range Status  ?11/30/2020 21 0 - 32 IU/L Final  ? ?AST  ?Date Value Ref Range Status  ?11/30/2020 21 0 - 40 IU/L Final  ? ?BUN  ?Date Value Ref Range Status  ?11/30/2020 11 8 - 27 mg/dL Final  ? ?Calcium  ?Date Value Ref Range Status  ?11/30/2020 9.3 8.7 - 10.3 mg/dL Final  ? ?CO2  ?Date Value Ref Range Status  ?11/30/2020 22 20 - 29 mmol/L Final  ? ?Creatinine, Ser  ?Date Value Ref Range Status  ?11/30/2020 1.11 (H) 0.57 - 1.00 mg/dL Final  ? ?Glucose  ?Date Value Ref Range Status  ?11/30/2020 83 70 - 99 mg/dL Final  ? ?Glucose, Bld  ?Date Value Ref Range Status  ?09/18/2017 99 70 - 99 mg/dL Final  ? ?Potassium  ?Date Value Ref Range Status  ?11/30/2020 4.1 3.5 - 5.2 mmol/L Final  ? ?Sodium  ?Date Value Ref Range Status  ?11/30/2020 138 134 - 144 mmol/L Final  ? ?Bilirubin Total  ?Date Value Ref Range Status  ?11/30/2020 1.2 0.0 - 1.2 mg/dL Final  ? ?Bilirubin, Direct  ?Date Value Ref Range Status  ?09/18/2017 0.1 0.0 - 0.2 mg/dL Final  ? ?Indirect Bilirubin  ?Date Value Ref Range Status  ?09/18/2017 0.7  0.3 - 0.9 mg/dL Final  ?  Comment:  ?  Performed at [facility], [address] [place], [place] [address]  ? ?Protein,UA  ?Date Value Ref Range Status  ?05/15/2020 Negative Negative/Trace Final  ? ?Total Protein  ?Date Value Ref  Range Status  ?11/30/2020 7.0 6.0 - 8.5 g/dL Final  ? ?GFR calc Af Amer  ?Date Value Ref Range Status  ?07/15/2019 59 (L) >59 mL/min/1.73 Final  ?  Comment:  ?  **[facility] currently reports eGFR in compliance with the current** ?  recommendations of the [employer]. [facility] will ?  update reporting as new guidelines are published from the NKF-ASN ?  Task force. ?  ? ?eGFR  ?Date Value Ref Range Status  ?11/30/2020 56 (L) >59 mL/min/1.73 Final  ? ?GFR calc non Af Amer  ?Date Value Ref Range Status  ?07/15/2019 51 (L) >59 mL/min/1.73 Final  ? ?  ?  ?  ? levothyroxine (SYNTHROID) 50 MCG tablet [Pharmacy Med Name: Levothyroxine Sodium 50 MCG Oral Tablet] 90 tablet 3  ?  Sig: TAKE 1 TABLET BY MOUTH  DAILY BEFORE BREAKFAST  ?  ? Endocrinology:  Hypothyroid Agents Passed - 04/10/2021 10:48 PM  ?  ?  Passed - TSH in normal range and within 360 days  ?  TSH  ?Date Value Ref Range Status  ?11/30/2020 3.150 0.450 - 4.500 uIU/mL Final  ?  ?  ?  ?  Passed - Valid encounter within last 12 months  ?  Recent Outpatient Visits   ? ?      ? 4 months ago Annual physical exam  ? [place], NP  ? 11 months ago Essential hypertension  ? [place], NP  ? 1 year ago Severe depression ([place])  ? [place], Henrine Screws T, NP  ? 1 year ago Chronic anxiety  ? [place], [place]  ? 1 year ago Chronic anxiety  ? [facility] [place], [place]  ? ?  ?  ? ?  ?  ?  ?  ?

## 2021-04-13 NOTE — Telephone Encounter (Signed)
Pt scheduled for next available 3/29 and would like a refill to last her until appt since she will be out before appt date. ?

## 2021-04-13 NOTE — Telephone Encounter (Signed)
See note below from the [facility].  ?

## 2021-04-21 ENCOUNTER — Encounter: Payer: Self-pay | Admitting: Nurse Practitioner

## 2021-04-21 ENCOUNTER — Ambulatory Visit (INDEPENDENT_AMBULATORY_CARE_PROVIDER_SITE_OTHER): Payer: Medicare Other | Admitting: Nurse Practitioner

## 2021-04-21 VITALS — BP 116/63 | HR 72 | Temp 98.6°F | Wt 227.4 lb

## 2021-04-21 DIAGNOSIS — E038 Other specified hypothyroidism: Secondary | ICD-10-CM

## 2021-04-21 DIAGNOSIS — I10 Essential (primary) hypertension: Secondary | ICD-10-CM | POA: Diagnosis not present

## 2021-04-21 DIAGNOSIS — N183 Chronic kidney disease, stage 3 unspecified: Secondary | ICD-10-CM

## 2021-04-21 DIAGNOSIS — R7301 Impaired fasting glucose: Secondary | ICD-10-CM

## 2021-04-21 DIAGNOSIS — F322 Major depressive disorder, single episode, severe without psychotic features: Secondary | ICD-10-CM

## 2021-04-21 DIAGNOSIS — F411 Generalized anxiety disorder: Secondary | ICD-10-CM

## 2021-04-21 DIAGNOSIS — E785 Hyperlipidemia, unspecified: Secondary | ICD-10-CM

## 2021-04-21 MED ORDER — NYSTATIN 100000 UNIT/GM EX CREA: 1.0000 "application " | TOPICAL_CREAM | Freq: Two times a day (BID) | CUTANEOUS | 1 refills | Status: DC

## 2021-04-21 NOTE — Assessment & Plan Note (Signed)
Chronic.  Controlled.  Continue with current medication regimen.  Labs ordered today.  Return to clinic in 6 months for reevaluation.  Call sooner if concerns arise.  ? ?

## 2021-04-21 NOTE — Assessment & Plan Note (Signed)
Labs ordered today.  Will make recommendations based on lab results. ?

## 2021-04-21 NOTE — Assessment & Plan Note (Addendum)
Chronic.  Controlled.  Continue with current medication regimen of Clonidine.  Labs ordered today.  Return to clinic in 6 months for reevaluation.  Call sooner if concerns arise.   

## 2021-04-21 NOTE — Assessment & Plan Note (Signed)
Chronic.  Labs ordered today. Will make recommendations based on lab results.  

## 2021-04-21 NOTE — Assessment & Plan Note (Signed)
Chronic. Improved from last visit. Denies SI.  Continue with current medication regimen.  Follow up if symptoms worsen or fail to improve.  Follow up in 3 months for reevaluation.  ?

## 2021-04-21 NOTE — Progress Notes (Signed)
? ?BP 116/63   Pulse 72   Temp 98.6 ?F (37 ?C) (Oral)   Wt 227 lb 6.4 oz (103.1 kg)   SpO2 94%   BMI 37.84 kg/m?   ? ?Subjective:  ? ? Patient ID: Kayla Wright, female    DOB: 03/01/1955, 66 y.o.   MRN: [id number] ? ?HPI: ?Kayla Wright is a 66 y.o. female ? ?Chief Complaint  ?Patient presents with  ? Diabetes  ?  3 month f/u  ? Hyperlipidemia  ? Hypertension  ? Depression  ? ?HYPERTENSION / HYPERLIPIDEMIA ?Satisfied with current treatment? no ?Duration of hypertension: years ?BP monitoring frequency: daily ?BP range: 120-130/80 ?BP medication side effects: no ?Past BP meds: clonidine ?Duration of hyperlipidemia: chronic ?Cholesterol medication side effects: no ?Cholesterol supplements: none ?Past cholesterol medications: rosuvastatin (crestor) ?Medication compliance:  excellent ?Aspirin: no ?Recent stressors: no ?Recurrent headaches: no ?Visual changes: no ?Palpitations: no ?Dyspnea: no ?Chest pain: no ?Lower extremity edema: no ?Dizzy/lightheaded: no ? ?HYPOTHYROIDISM ?Thyroid control status:controlled ?Satisfied with current treatment? no ?Medication side effects: no ?Medication compliance: excellent compliance ?Etiology of hypothyroidism:  ?Recent dose adjustment:yes ?Fatigue: no ?Cold intolerance: no ?Heat intolerance: no ?Weight gain: no ?Weight loss: no ?Constipation: no ?Diarrhea/loose stools: no ?Palpitations: no ?Lower extremity edema: no ?Anxiety/depressed mood: no ? ?ANXIETY/DEPRESSION ?Patient states her mood is working well.  She feels like she is doing well.  Denies concerns regarding her mood at visit today.  ? ?[facility] Visit from 04/21/2021 in [place]  ?PHQ-9 Total Score 16  ? ?  ? ? ?  11/30/2020  ?  2:06 PM 05/15/2020  ?  2:22 PM 01/15/2020  ?  4:00 PM 08/12/2019  ?  3:48 PM  ?GAD 7 : Generalized Anxiety Score  ?Nervous, Anxious, on Edge 2 0 1 2  ?Control/stop worrying '3 1 2 2  ' ?Worry too much - different things '2 1 1 3  ' ?Trouble relaxing 2 0 2 2  ?Restless 2 0 1 2   ?Easily annoyed or irritable '2 1 1 3  ' ?Afraid - awful might happen 3 0 2 2  ?Total GAD 7 Score '16 3 10 16  ' ?Anxiety Difficulty Somewhat difficult Not difficult at all Somewhat difficult Somewhat difficult  ? ? ? ?Patient states she has a rash under her belly.  It is red and painful. ? ?Relevant past medical, surgical, family and social history reviewed and updated as indicated. Interim medical history since our last visit reviewed. ?Allergies and medications reviewed and updated. ? ?Review of Systems  ?Eyes:  Negative for visual disturbance.  ?Respiratory:  Negative for cough, chest tightness and shortness of breath.   ?Cardiovascular:  Negative for chest pain, palpitations and leg swelling.  ?Skin:  Positive for rash.  ?Neurological:  Negative for dizziness and headaches.  ?Psychiatric/Behavioral:  Positive for dysphoric mood. The patient is nervous/anxious.   ? ?Per HPI unless specifically indicated above ? ?   ?Objective:  ?  ?BP 116/63   Pulse 72   Temp 98.6 ?F (37 ?C) (Oral)   Wt 227 lb 6.4 oz (103.1 kg)   SpO2 94%   BMI 37.84 kg/m?   ?Wt Readings from Last 3 Encounters:  ?04/21/21 227 lb 6.4 oz (103.1 kg)  ?11/30/20 221 lb 12.8 oz (100.6 kg)  ?11/09/20 223 lb 14.4 oz (101.6 kg)  ?  ?Physical Exam ?Vitals and nursing note reviewed.  ?Constitutional:   ?   General: She is not in acute distress. ?   Appearance:  Normal appearance. She is normal weight. She is not ill-appearing, toxic-appearing or diaphoretic.  ?HENT:  ?   Head: Normocephalic.  ?   Right Ear: External ear normal.  ?   Left Ear: External ear normal.  ?   Nose: Nose normal.  ?   Mouth/Throat:  ?   Mouth: Mucous membranes are moist.  ?   Pharynx: Oropharynx is clear.  ?Eyes:  ?   General:     ?   Right eye: No discharge.     ?   Left eye: No discharge.  ?   Extraocular Movements: Extraocular movements intact.  ?   Conjunctiva/sclera: Conjunctivae normal.  ?   Pupils: Pupils are equal, round, and reactive to light.  ?Cardiovascular:  ?   Rate and  Rhythm: Normal rate and regular rhythm.  ?   Heart sounds: No murmur heard. ?Pulmonary:  ?   Effort: Pulmonary effort is normal. No respiratory distress.  ?   Breath sounds: Normal breath sounds. No wheezing or rales.  ?Musculoskeletal:  ?   Cervical back: Normal range of motion and neck supple.  ?Skin: ?   General: Skin is warm and dry.  ?   Capillary Refill: Capillary refill takes less than 2 seconds.  ? ?    ?Neurological:  ?   General: No focal deficit present.  ?   Mental Status: She is alert and oriented to person, place, and time. Mental status is at baseline.  ?Psychiatric:     ?   Mood and Affect: Mood normal.     ?   Behavior: Behavior normal.     ?   Thought Content: Thought content normal.     ?   Judgment: Judgment normal.  ? ? ?Results for orders placed or performed in visit on 04/21/21  ?CBC and differential  ?Result Value Ref Range  ? Hemoglobin 11.4 (A) 12.0 - 16.0  ? HCT 35 (A) 36 - 46  ? Platelets 311 150 - 400 K/uL  ? WBC 6.5   ?CBC  ?Result Value Ref Range  ? RBC 4.1 3.87 - 5.11  ?Basic metabolic panel  ?Result Value Ref Range  ? Glucose 112   ? BUN 13 4 - 21  ? CO2 25 (A) 13 - 22  ? Creatinine 0.9 0.5 - 1.1  ? Potassium 4.5 3.5 - 5.1 mEq/L  ? Sodium 140 137 - 147  ? Chloride 104 99 - 108  ?Comprehensive metabolic panel  ?Result Value Ref Range  ? Globulin 4.0   ? eGFR 63   ? Calcium 9.3 8.7 - 10.7  ? Albumin 3.7 3.5 - 5.0  ?Hepatic function panel  ?Result Value Ref Range  ? Alkaline Phosphatase 116 25 - 125  ? ALT 8 7 - 35 U/L  ? AST 15 13 - 35  ? ?   ?Assessment & Plan:  ? ?Problem List Items Addressed This Visit   ? ?  ? Cardiovascular and Mediastinum  ? Essential hypertension  ?  Chronic.  Controlled.  Continue with current medication regimen of Clonidine.  Labs ordered today.  Return to clinic in 6 months for reevaluation.  Call sooner if concerns arise.  ? ?  ?  ? Relevant Orders  ? Comp Met (CMET)  ?  ? Endocrine  ? Hypothyroidism  ?  Labs ordered today. Will make recommendations based on  lab results.  ?  ?  ? Relevant Orders  ? TSH  ? T4, free  ?  IFG (impaired fasting glucose)  ?  Labs ordered today. Will make recommendations based on lab results.  ?  ?  ? Relevant Orders  ? HgB A1c  ?  ? Genitourinary  ? CKD (chronic kidney disease) stage 3, GFR 30-59 ml/min (HCC) - Primary  ?  Chronic. Labs ordered today. Will make recommendations based on lab results.  ?  ?  ?  ? Other  ? Severe depression ([place])  ?  Chronic. Improved from last visit. Denies SI.  Continue with current medication regimen.  Follow up if symptoms worsen or fail to improve.  Follow up in 3 months for reevaluation.  ?  ?  ? Hyperlipidemia  ?  Chronic.  Controlled.  Continue with current medication regimen.  Labs ordered today.  Return to clinic in 6 months for reevaluation.  Call sooner if concerns arise.  ? ?  ?  ? Relevant Orders  ? Lipid Profile  ? Anxiety disorder  ?  Chronic. Improved from last visit. Denies SI.  Continue with current medication regimen.  Follow up if symptoms worsen or fail to improve.  Follow up in 3 months for reevaluation.  ?  ?  ?  ? ?Follow up plan: ?Return in about 6 months (around 10/22/2021) for HTN, HLD, DM2 FU. ? ? ? ? ? ?

## 2021-04-21 NOTE — Assessment & Plan Note (Signed)
Chronic. Improved from last visit. Denies SI.  Continue with current medication regimen.  Follow up if symptoms worsen or fail to improve.  Follow up in 3 months for reevaluation.  ?

## 2021-04-22 LAB — COMPREHENSIVE METABOLIC PANEL
ALT: 21 IU/L (ref 0–32)
AST: 18 IU/L (ref 0–40)
Albumin/Globulin Ratio: 1.5 (ref 1.2–2.2)
Albumin: 4.2 g/dL (ref 3.8–4.8)
Alkaline Phosphatase: 93 IU/L (ref 44–121)
BUN/Creatinine Ratio: 9 — ABNORMAL LOW (ref 12–28)
BUN: 10 mg/dL (ref 8–27)
Bilirubin Total: 0.6 mg/dL (ref 0.0–1.2)
CO2: 24 mmol/L (ref 20–29)
Calcium: 9.3 mg/dL (ref 8.7–10.3)
Chloride: 103 mmol/L (ref 96–106)
Creatinine, Ser: 1.09 mg/dL — ABNORMAL HIGH (ref 0.57–1.00)
Globulin, Total: 2.8 g/dL (ref 1.5–4.5)
Glucose: 91 mg/dL (ref 70–99)
Potassium: 4.2 mmol/L (ref 3.5–5.2)
Sodium: 140 mmol/L (ref 134–144)
Total Protein: 7 g/dL (ref 6.0–8.5)
eGFR: 56 mL/min/{1.73_m2} — ABNORMAL LOW (ref >59)

## 2021-04-22 LAB — LIPID PANEL
Chol/HDL Ratio: 4 ratio (ref 0.0–4.4)
Cholesterol, Total: 157 mg/dL (ref 100–199)
HDL: 39 mg/dL — ABNORMAL LOW (ref >39)
LDL Chol Calc (NIH): 91 mg/dL (ref 0–99)
Triglycerides: 155 mg/dL — ABNORMAL HIGH (ref 0–149)
VLDL Cholesterol Cal: 27 mg/dL (ref 5–40)

## 2021-04-22 LAB — T4, FREE: Free T4: 0.98 ng/dL (ref 0.82–1.77)

## 2021-04-22 LAB — HEMOGLOBIN A1C
Est. average glucose Bld gHb Est-mCnc: 131 mg/dL
Hgb A1c MFr Bld: 6.2 % — ABNORMAL HIGH (ref 4.8–5.6)

## 2021-04-22 LAB — TSH: TSH: 4.95 u[IU]/mL — ABNORMAL HIGH (ref 0.450–4.500)

## 2021-04-22 NOTE — Progress Notes (Signed)
Please let patient know that overall her lab work looks good. I recommend she increase her water intake.  Her thyroid was slightly abnormal but I would like her to continue her current dose and we will recheck it at her next visit.  A1c is well controlled.  Continue with current medication regimen.  Follow up as discussed.

## 2021-05-22 ENCOUNTER — Other Ambulatory Visit: Payer: Self-pay | Admitting: Nurse Practitioner

## 2021-05-24 NOTE — Telephone Encounter (Signed)
Requested Prescriptions  ?Pending Prescriptions Disp Refills  ?? levothyroxine (SYNTHROID) 50 MCG tablet [Pharmacy Med Name: Levothyroxine Sodium 50 MCG Oral Tablet] 30 tablet 11  ?  Sig: TAKE 1 TABLET BY MOUTH DAILY  BEFORE BREAKFAST  ?  ? Endocrinology:  Hypothyroid Agents Failed - 05/22/2021 10:51 PM  ?  ?  Failed - TSH in normal range and within 360 days  ?  TSH  ?Date Value Ref Range Status  ?04/21/2021 4.950 (H) 0.450 - 4.500 uIU/mL Final  ?   ?  ?  Passed - Valid encounter within last 12 months  ?  Recent Outpatient Visits   ?      ? 1 month ago Stage 3 chronic kidney disease, unspecified whether stage 3a or 3b CKD ([place])  ? [place], NP  ? 5 months ago Annual physical exam  ? [place], NP  ? 1 year ago Essential hypertension  ? [place], NP  ? 1 year ago Severe depression ([place])  ? [place], Henrine Screws T, NP  ? 1 year ago Chronic anxiety  ? [place], [place], [place]  ?  ?  ?Future Appointments   ?        ? In 5 months Jon Billings, NP [facility], [facility]  ?  ? ?  ?  ?  ?? QUEtiapine Fumarate (SEROQUEL XR) 150 MG 24 hr tablet [Pharmacy Med Name: QUETIAPINE  150MG  TAB  XR] 30 tablet 11  ?  Sig: TAKE 1 TABLET BY MOUTH AT  BEDTIME  ?  ? Not Delegated - Psychiatry:  Antipsychotics - Second Generation (Atypical) - quetiapine Failed - 05/22/2021 10:51 PM  ?  ?  Failed - This refill cannot be delegated  ?  ?  Failed - TSH in normal range and within 360 days  ?  TSH  ?Date Value Ref Range Status  ?04/21/2021 4.950 (H) 0.450 - 4.500 uIU/mL Final  ?   ?  ?  Failed - Lipid Panel in normal range within the last 12 months  ?  Cholesterol, Total  ?Date Value Ref Range Status  ?04/21/2021 157 100 - 199 mg/dL Final  ? ?LDL Chol Calc (NIH)  ?Date Value Ref Range Status  ?04/21/2021 91 0 - 99 mg/dL Final  ? ?HDL  ?Date Value Ref Range Status  ?04/21/2021 39 (L) >39  mg/dL Final  ? ?Triglycerides  ?Date Value Ref Range Status  ?04/21/2021 155 (H) 0 - 149 mg/dL Final  ? ?  ?  ?  Failed - CBC within normal limits and completed in the last 12 months  ?  WBC  ?Date Value Ref Range Status  ?03/22/2021 6.5  Final  ?09/18/2017 8.8 3.6 - 11.0 K/uL Final  ? ?RBC  ?Date Value Ref Range Status  ?03/22/2021 4.1 3.87 - 5.11 Final  ? ?Hemoglobin  ?Date Value Ref Range Status  ?03/22/2021 11.4 (A) 12.0 - 16.0 Final  ?02/26/2018 13.6 11.1 - 15.9 g/dL Final  ? ?HCT  ?Date Value Ref Range Status  ?03/22/2021 35 (A) 36 - 46 Final  ? ?Hematocrit  ?Date Value Ref Range Status  ?02/26/2018 40.8 34.0 - 46.6 % Final  ? ?MCHC  ?Date Value Ref Range Status  ?02/26/2018 33.3 31.5 - 35.7 g/dL Final  ?09/18/2017 34.2 32.0 - 36.0 g/dL Final  ? ?MCH  ?Date Value Ref Range Status  ?02/26/2018 30.6 26.6 - 33.0 pg Final  ?  09/18/2017 31.6 26.0 - 34.0 pg Final  ? ?MCV  ?Date Value Ref Range Status  ?02/26/2018 92 79 - 97 fL Final  ? ?No results found for: PLTCOUNTKUC, LABPLAT, [place] ?RDW  ?Date Value Ref Range Status  ?02/26/2018 14.3 11.7 - 15.4 % Final  ? ?  ?  ?  Passed - Completed PHQ-2 or PHQ-9 in the last 360 days  ?  ?  Passed - Last BP in normal range  ?  BP Readings from Last 1 Encounters:  ?04/21/21 116/63  ?   ?  ?  Passed - Last Heart Rate in normal range  ?  Pulse Readings from Last 1 Encounters:  ?04/21/21 72  ?   ?  ?  Passed - Valid encounter within last 6 months  ?  Recent Outpatient Visits   ?      ? 1 month ago Stage 3 chronic kidney disease, unspecified whether stage 3a or 3b CKD ([place])  ? [place], NP  ? 5 months ago Annual physical exam  ? [place], NP  ? 1 year ago Essential hypertension  ? [place], NP  ? 1 year ago Severe depression ([place])  ? [place], Henrine Screws T, NP  ? 1 year ago Chronic anxiety  ? [place], [place], [place]  ?  ?  ?Future  Appointments   ?        ? In 5 months Jon Billings, NP [facility], [facility]  ?  ? ?  ?  ?  Passed - CMP within normal limits and completed in the last 12 months  ?  Albumin  ?Date Value Ref Range Status  ?04/21/2021 4.2 3.8 - 4.8 g/dL Final  ? ?Alkaline Phosphatase  ?Date Value Ref Range Status  ?04/21/2021 93 44 - 121 IU/L Final  ? ?ALT  ?Date Value Ref Range Status  ?04/21/2021 21 0 - 32 IU/L Final  ? ?AST  ?Date Value Ref Range Status  ?04/21/2021 18 0 - 40 IU/L Final  ? ?BUN  ?Date Value Ref Range Status  ?04/21/2021 10 8 - 27 mg/dL Final  ? ?Calcium  ?Date Value Ref Range Status  ?04/21/2021 9.3 8.7 - 10.3 mg/dL Final  ? ?CO2  ?Date Value Ref Range Status  ?04/21/2021 24 20 - 29 mmol/L Final  ? ?Creatinine, Ser  ?Date Value Ref Range Status  ?04/21/2021 1.09 (H) 0.57 - 1.00 mg/dL Final  ? ?Glucose  ?Date Value Ref Range Status  ?04/21/2021 91 70 - 99 mg/dL Final  ?03/22/2021 112  Final  ? ?Glucose, Bld  ?Date Value Ref Range Status  ?09/18/2017 99 70 - 99 mg/dL Final  ? ?Potassium  ?Date Value Ref Range Status  ?04/21/2021 4.2 3.5 - 5.2 mmol/L Final  ? ?Sodium  ?Date Value Ref Range Status  ?04/21/2021 140 134 - 144 mmol/L Final  ? ?Bilirubin Total  ?Date Value Ref Range Status  ?04/21/2021 0.6 0.0 - 1.2 mg/dL Final  ? ?Bilirubin, Direct  ?Date Value Ref Range Status  ?09/18/2017 0.1 0.0 - 0.2 mg/dL Final  ? ?Indirect Bilirubin  ?Date Value Ref Range Status  ?09/18/2017 0.7 0.3 - 0.9 mg/dL Final  ?  Comment:  ?  Performed at [facility], [address] [place], [place] [address]  ? ?Protein,UA  ?Date Value Ref Range Status  ?05/15/2020 Negative Negative/Trace Final  ? ?Total Protein  ?Date Value Ref Range  Status  ?04/21/2021 7.0 6.0 - 8.5 g/dL Final  ? ?GFR calc Af Amer  ?Date Value Ref Range Status  ?07/15/2019 59 (L) >59 mL/min/1.73 Final  ?  Comment:  ?  **[facility] currently reports eGFR in compliance with the current** ?  recommendations of the [employer]. [facility]  will ?  update reporting as new guidelines are published from the NKF-ASN ?  Task force. ?  ? ?eGFR  ?Date Value Ref Range Status  ?04/21/2021 56 (L) >59 mL/min/1.73 Final  ? ?GFR calc non Af Amer  ?Date Value Ref Range Status  ?07/15/2019 51 (L) >59 mL/min/1.73 Final  ? ?  ?  ?  ?? simvastatin (ZOCOR) 40 MG tablet [Pharmacy Med Name: Simvastatin 40 MG Oral Tablet] 30 tablet 11  ?  Sig: TAKE 1 TABLET BY MOUTH DAILY  ?  ? Cardiovascular:  Antilipid - Statins Failed - 05/22/2021 10:51 PM  ?  ?  Failed - Lipid Panel in normal range within the last 12 months  ?  Cholesterol, Total  ?Date Value Ref Range Status  ?04/21/2021 157 100 - 199 mg/dL Final  ? ?LDL Chol Calc (NIH)  ?Date Value Ref Range Status  ?04/21/2021 91 0 - 99 mg/dL Final  ? ?HDL  ?Date Value Ref Range Status  ?04/21/2021 39 (L) >39 mg/dL Final  ? ?Triglycerides  ?Date Value Ref Range Status  ?04/21/2021 155 (H) 0 - 149 mg/dL Final  ? ?  ?  ?  Passed - Patient is not pregnant  ?  ?  Passed - Valid encounter within last 12 months  ?  Recent Outpatient Visits   ?      ? 1 month ago Stage 3 chronic kidney disease, unspecified whether stage 3a or 3b CKD ([place])  ? [place], NP  ? 5 months ago Annual physical exam  ? [place], NP  ? 1 year ago Essential hypertension  ? [place], NP  ? 1 year ago Severe depression ([place])  ? [place], Henrine Screws T, NP  ? 1 year ago Chronic anxiety  ? [place], [place], [place]  ?  ?  ?Future Appointments   ?        ? In 5 months Jon Billings, NP [facility], [facility]  ?  ? ?  ?  ?  ? ? ?

## 2021-05-24 NOTE — Telephone Encounter (Signed)
Requested medication (s) are due for refill today: yes ? ?Requested medication (s) are on the active medication list: yes ? ?Last refill:  04/13/21 #30/0 ? ?Future visit scheduled: yes ? ?Notes to clinic:  Unable to refill per protocol, cannot delegate. ? ?  ?Requested Prescriptions  ?Pending Prescriptions Disp Refills  ? QUEtiapine Fumarate (SEROQUEL XR) 150 MG 24 hr tablet [Pharmacy Med Name: QUETIAPINE  150MG  TAB  XR] 30 tablet 11  ?  Sig: TAKE 1 TABLET BY MOUTH AT  BEDTIME  ?  ? Not Delegated - Psychiatry:  Antipsychotics - Second Generation (Atypical) - quetiapine Failed - 05/22/2021 10:51 PM  ?  ?  Failed - This refill cannot be delegated  ?  ?  Failed - TSH in normal range and within 360 days  ?  TSH  ?Date Value Ref Range Status  ?04/21/2021 4.950 (H) 0.450 - 4.500 uIU/mL Final  ?  ?  ?  ?  Failed - Lipid Panel in normal range within the last 12 months  ?  Cholesterol, Total  ?Date Value Ref Range Status  ?04/21/2021 157 100 - 199 mg/dL Final  ? ?LDL Chol Calc (NIH)  ?Date Value Ref Range Status  ?04/21/2021 91 0 - 99 mg/dL Final  ? ?HDL  ?Date Value Ref Range Status  ?04/21/2021 39 (L) >39 mg/dL Final  ? ?Triglycerides  ?Date Value Ref Range Status  ?04/21/2021 155 (H) 0 - 149 mg/dL Final  ? ?  ?  ?  Failed - CBC within normal limits and completed in the last 12 months  ?  WBC  ?Date Value Ref Range Status  ?03/22/2021 6.5  Final  ?09/18/2017 8.8 3.6 - 11.0 K/uL Final  ? ?RBC  ?Date Value Ref Range Status  ?03/22/2021 4.1 3.87 - 5.11 Final  ? ?Hemoglobin  ?Date Value Ref Range Status  ?03/22/2021 11.4 (A) 12.0 - 16.0 Final  ?02/26/2018 13.6 11.1 - 15.9 g/dL Final  ? ?HCT  ?Date Value Ref Range Status  ?03/22/2021 35 (A) 36 - 46 Final  ? ?Hematocrit  ?Date Value Ref Range Status  ?02/26/2018 40.8 34.0 - 46.6 % Final  ? ?MCHC  ?Date Value Ref Range Status  ?02/26/2018 33.3 31.5 - 35.7 g/dL Final  ?09/18/2017 34.2 32.0 - 36.0 g/dL Final  ? ?MCH  ?Date Value Ref Range Status  ?02/26/2018 30.6 26.6 - 33.0 pg  Final  ?09/18/2017 31.6 26.0 - 34.0 pg Final  ? ?MCV  ?Date Value Ref Range Status  ?02/26/2018 92 79 - 97 fL Final  ? ?No results found for: PLTCOUNTKUC, LABPLAT, [place] ?RDW  ?Date Value Ref Range Status  ?02/26/2018 14.3 11.7 - 15.4 % Final  ? ?  ?  ?  Passed - Completed PHQ-2 or PHQ-9 in the last 360 days  ?  ?  Passed - Last BP in normal range  ?  BP Readings from Last 1 Encounters:  ?04/21/21 116/63  ?  ?  ?  ?  Passed - Last Heart Rate in normal range  ?  Pulse Readings from Last 1 Encounters:  ?04/21/21 72  ?  ?  ?  ?  Passed - Valid encounter within last 6 months  ?  Recent Outpatient Visits   ? ?      ? 1 month ago Stage 3 chronic kidney disease, unspecified whether stage 3a or 3b CKD ([place])  ? [place], NP  ? 5 months ago Annual physical exam  ?  [place], NP  ? 1 year ago Essential hypertension  ? [place], NP  ? 1 year ago Severe depression ([place])  ? [place], Henrine Screws T, NP  ? 1 year ago Chronic anxiety  ? [place], [place], [place]  ? ?  ?  ?Future Appointments   ? ?        ? In 5 months Jon Billings, NP [facility], [facility]  ? ?  ? ? ?  ?  ?  Passed - CMP within normal limits and completed in the last 12 months  ?  Albumin  ?Date Value Ref Range Status  ?04/21/2021 4.2 3.8 - 4.8 g/dL Final  ? ?Alkaline Phosphatase  ?Date Value Ref Range Status  ?04/21/2021 93 44 - 121 IU/L Final  ? ?ALT  ?Date Value Ref Range Status  ?04/21/2021 21 0 - 32 IU/L Final  ? ?AST  ?Date Value Ref Range Status  ?04/21/2021 18 0 - 40 IU/L Final  ? ?BUN  ?Date Value Ref Range Status  ?04/21/2021 10 8 - 27 mg/dL Final  ? ?Calcium  ?Date Value Ref Range Status  ?04/21/2021 9.3 8.7 - 10.3 mg/dL Final  ? ?CO2  ?Date Value Ref Range Status  ?04/21/2021 24 20 - 29 mmol/L Final  ? ?Creatinine, Ser  ?Date Value Ref Range Status  ?04/21/2021 1.09 (H) 0.57 - 1.00 mg/dL Final   ? ?Glucose  ?Date Value Ref Range Status  ?04/21/2021 91 70 - 99 mg/dL Final  ?03/22/2021 112  Final  ? ?Glucose, Bld  ?Date Value Ref Range Status  ?09/18/2017 99 70 - 99 mg/dL Final  ? ?Potassium  ?Date Value Ref Range Status  ?04/21/2021 4.2 3.5 - 5.2 mmol/L Final  ? ?Sodium  ?Date Value Ref Range Status  ?04/21/2021 140 134 - 144 mmol/L Final  ? ?Bilirubin Total  ?Date Value Ref Range Status  ?04/21/2021 0.6 0.0 - 1.2 mg/dL Final  ? ?Bilirubin, Direct  ?Date Value Ref Range Status  ?09/18/2017 0.1 0.0 - 0.2 mg/dL Final  ? ?Indirect Bilirubin  ?Date Value Ref Range Status  ?09/18/2017 0.7 0.3 - 0.9 mg/dL Final  ?  Comment:  ?  Performed at [facility], [address] [place], [place] [address]  ? ?Protein,UA  ?Date Value Ref Range Status  ?05/15/2020 Negative Negative/Trace Final  ? ?Total Protein  ?Date Value Ref Range Status  ?04/21/2021 7.0 6.0 - 8.5 g/dL Final  ? ?GFR calc Af Amer  ?Date Value Ref Range Status  ?07/15/2019 59 (L) >59 mL/min/1.73 Final  ?  Comment:  ?  **[facility] currently reports eGFR in compliance with the current** ?  recommendations of the [employer]. [facility] will ?  update reporting as new guidelines are published from the NKF-ASN ?  Task force. ?  ? ?eGFR  ?Date Value Ref Range Status  ?04/21/2021 56 (L) >59 mL/min/1.73 Final  ? ?GFR calc non Af Amer  ?Date Value Ref Range Status  ?07/15/2019 51 (L) >59 mL/min/1.73 Final  ? ?  ?  ?  ?Signed Prescriptions Disp Refills  ? levothyroxine (SYNTHROID) 50 MCG tablet 30 tablet 11  ?  Sig: TAKE 1 TABLET BY MOUTH DAILY  BEFORE BREAKFAST  ?  ? Endocrinology:  Hypothyroid Agents Failed - 05/22/2021 10:51 PM  ?  ?  Failed - TSH in normal range and within 360 days  ?  TSH  ?Date Value Ref Range Status  ?  04/21/2021 4.950 (H) 0.450 - 4.500 uIU/mL Final  ?  ?  ?  ?  Passed - Valid encounter within last 12 months  ?  Recent Outpatient Visits   ? ?      ? 1 month ago Stage 3 chronic kidney disease, unspecified whether stage 3a or  3b CKD ([place])  ? [place], NP  ? 5 months ago Annual physical exam  ? [place], NP  ? 1 year ago Essential hypertension  ? [place], NP  ? 1 year ago Severe depression ([place])  ? [place], Henrine Screws T, NP  ? 1 year ago Chronic anxiety  ? [place], [place], [place]  ? ?  ?  ?Future Appointments   ? ?        ? In 5 months Jon Billings, NP [facility], [facility]  ? ?  ? ? ?  ?  ?  ? simvastatin (ZOCOR) 40 MG tablet 30 tablet 11  ?  Sig: TAKE 1 TABLET BY MOUTH DAILY  ?  ? Cardiovascular:  Antilipid - Statins Failed - 05/22/2021 10:51 PM  ?  ?  Failed - Lipid Panel in normal range within the last 12 months  ?  Cholesterol, Total  ?Date Value Ref Range Status  ?04/21/2021 157 100 - 199 mg/dL Final  ? ?LDL Chol Calc (NIH)  ?Date Value Ref Range Status  ?04/21/2021 91 0 - 99 mg/dL Final  ? ?HDL  ?Date Value Ref Range Status  ?04/21/2021 39 (L) >39 mg/dL Final  ? ?Triglycerides  ?Date Value Ref Range Status  ?04/21/2021 155 (H) 0 - 149 mg/dL Final  ? ?  ?  ?  Passed - Patient is not pregnant  ?  ?  Passed - Valid encounter within last 12 months  ?  Recent Outpatient Visits   ? ?      ? 1 month ago Stage 3 chronic kidney disease, unspecified whether stage 3a or 3b CKD ([place])  ? [place], NP  ? 5 months ago Annual physical exam  ? [place], NP  ? 1 year ago Essential hypertension  ? [place], NP  ? 1 year ago Severe depression ([place])  ? [place], Henrine Screws T, NP  ? 1 year ago Chronic anxiety  ? [place], [place], [place]  ? ?  ?  ?Future Appointments   ? ?        ? In 5 months Jon Billings, NP [facility], [facility]  ? ?  ? ? ?  ?  ?  ? ?

## 2021-05-25 ENCOUNTER — Other Ambulatory Visit: Payer: Self-pay | Admitting: Nurse Practitioner

## 2021-05-26 NOTE — Telephone Encounter (Signed)
Requested Prescriptions  ?Pending Prescriptions Disp Refills  ?? FLUoxetine (PROZAC) 40 MG capsule [Pharmacy Med Name: FLUOXETINE  40MG   CAP] 90 capsule 0  ?  Sig: TAKE 1 CAPSULE BY MOUTH DAILY  ?  ? Psychiatry:  Antidepressants - SSRI Passed - 05/25/2021 10:03 PM  ?  ?  Passed - Completed PHQ-2 or PHQ-9 in the last 360 days  ?  ?  Passed - Valid encounter within last 6 months  ?  Recent Outpatient Visits   ?      ? 1 month ago Stage 3 chronic kidney disease, unspecified whether stage 3a or 3b CKD (HCC)  ? [facility], NP  ? 5 months ago Annual physical exam  ? [facility], NP  ? 1 year ago Essential hypertension  ? [facility], NP  ? 1 year ago Severe depression (HCC)  ? [facility] [place], [place] T, NP  ? 1 year ago Chronic anxiety  ? [facility] [place], [place], [place]  ?  ?  ?Future Appointments   ?        ? In 4 months [place], NP [facility], [facility]  ?  ? ?  ?  ?  ? ? ?

## 2021-07-10 ENCOUNTER — Other Ambulatory Visit: Payer: Self-pay | Admitting: Nurse Practitioner

## 2021-07-12 ENCOUNTER — Other Ambulatory Visit: Payer: Self-pay | Admitting: Nurse Practitioner

## 2021-07-12 ENCOUNTER — Ambulatory Visit: Payer: Self-pay

## 2021-07-12 NOTE — Telephone Encounter (Signed)
  Chief Complaint: foot pain Symptoms: L foot bone spur, pain 9-10/10 Frequency: 1 month Pertinent Negatives: Patient denies swelling or redness Disposition: [] ED /[] Urgent Care (no appt availability in office) / [x] Appointment(In office/virtual)/ []  [facility]/ [] Home Care/ [] Refused Recommended Disposition /[] [facility]/ []  Follow-up with PCP Additional Notes: pt was told about 1 month ago at pain clinic she had bone spur when they took xrays of her L ankle since she wears a boot on that foot. Scheduled for appt tomorrow at 1420 with Dr. .   Reason for Disposition  [1] SEVERE pain (e.g., excruciating, unable to do any normal activities) AND [2] not improved after 2 hours of pain medicine  Answer Assessment - Initial Assessment Questions 1. ONSET: "When did the pain start?"      1 month  2. LOCATION: "Where is the pain located?"      L foot  3. PAIN: "How bad is the pain?"    (Scale 1-10; or mild, moderate, severe)  - MILD (1-3): doesn't interfere with normal activities.   - MODERATE (4-7): interferes with normal activities (e.g., work or school) or awakens from sleep, limping.   - SEVERE (8-10): excruciating pain, unable to do any normal activities, unable to walk.      9-10/10 5. CAUSE: "What do you think is causing the foot pain?"     Bone spur  6. OTHER SYMPTOMS: "Do you have any other symptoms?" (e.g., leg pain, rash, fever, numbness)     no  Protocols used: Foot Pain-A-AH

## 2021-07-12 NOTE — Telephone Encounter (Signed)
Requested medications are due for refill today.  yes  Requested medications are on the active medications list.  yes  Last refill. Both refilled 02/10/2021  - 6 month supply  Future visit scheduled.   yes  Notes to clinic.  Pt is requesting a 1 year supply.    Requested Prescriptions  Pending Prescriptions Disp Refills   FLUoxetine (PROZAC) 20 MG capsule [Pharmacy Med Name: FLUoxetine HCl 20 MG Oral Capsule] 90 capsule 3    Sig: TAKE 1 CAPSULE BY MOUTH  DAILY WITH A 40 MG CAPSULE  FOR TOTAL 60 MG DAILY     Psychiatry:  Antidepressants - SSRI Passed - 07/10/2021  5:10 AM      Passed - Completed PHQ-2 or PHQ-9 in the last 360 days      Passed - Valid encounter within last 6 months    Recent Outpatient Visits           2 months ago Stage 3 chronic kidney disease, unspecified whether stage 3a or 3b CKD (HCC)   [facility] Larae Grooms, NP   7 months ago Annual physical exam   [facility] Larae Grooms, NP   1 year ago Essential hypertension   [facility] Larae Grooms, NP   1 year ago Severe depression (HCC)   [facility] Aura Dials T, NP   1 year ago Chronic anxiety   [facility] [place], Salley Hews, [place]       Future Appointments             In 3 months Larae Grooms, NP [facility], [facility]             cloNIDine (CATAPRES) 0.2 MG tablet [Pharmacy Med Name: cloNIDine HCl 0.2 MG Oral Tablet] 270 tablet 3    Sig: TAKE 1 TABLET BY MOUTH 3  TIMES DAILY     Cardiovascular:  Alpha-2 Agonists Passed - 07/10/2021  5:10 AM      Passed - Last BP in normal range    BP Readings from Last 1 Encounters:  04/21/21 116/63         Passed - Last Heart Rate in normal range    Pulse Readings from Last 1 Encounters:  04/21/21 72         Passed - Valid encounter within last 6 months    Recent Outpatient Visits           2 months ago Stage 3 chronic kidney disease, unspecified  whether stage 3a or 3b CKD (HCC)   [facility] Larae Grooms, NP   7 months ago Annual physical exam   [facility] Larae Grooms, NP   1 year ago Essential hypertension   [facility] Larae Grooms, NP   1 year ago Severe depression [facility])   [facility] Marjie Skiff, NP   1 year ago Chronic anxiety   [facility] Particia [place], [place]       Future Appointments             In 3 months Larae Grooms, NP [facility], [facility]

## 2021-07-13 ENCOUNTER — Ambulatory Visit (INDEPENDENT_AMBULATORY_CARE_PROVIDER_SITE_OTHER): Payer: Medicare Other | Admitting: Family Medicine

## 2021-07-13 ENCOUNTER — Encounter: Payer: Self-pay | Admitting: Family Medicine

## 2021-07-13 VITALS — BP 102/66 | HR 63 | Temp 98.4°F | Wt 227.8 lb

## 2021-07-13 DIAGNOSIS — M7752 Other enthesopathy of left foot: Secondary | ICD-10-CM

## 2021-07-13 NOTE — Progress Notes (Signed)
BP 102/66   Pulse 63   Temp 98.4 F (36.9 C)   Wt 227 lb 12.8 oz (103.3 kg)   SpO2 96%   BMI 37.91 kg/m    Subjective:    Patient ID: Kayla Wright, female    DOB: Jan 13, 1956, 66 y.o.   MRN: [id number]  HPI: Kayla Wright is a 66 y.o. female  Chief Complaint  Patient presents with   Foot Pain    Patient states she was told a month ago she has a bone spur in her left foot, patient states the past 3 weeks the pain has gotten worse. Patient has not tried any OTC medications    FOOT PAIN Duration: at least a month, worse in the last 3 weeks Involved foot: left Mechanism of injury: unknown Location: bottom of her foot Onset: sudden  Severity: severe  Quality:  sharp and aching Frequency: constant Radiation: no Aggravating factors: weight bearing and walking  Alleviating factors: nothing  Status: worse Treatments attempted: none and APAP  Relief with NSAIDs?:  No NSAIDs Taken Weakness with weight bearing or walking: no Morning stiffness: no Swelling: no Redness: no Bruising: no Paresthesias / decreased sensation: no  Fevers:no  Relevant past medical, surgical, family and social history reviewed and updated as indicated. Interim medical history since our last visit reviewed. Allergies and medications reviewed and updated.  Review of Systems  Constitutional: Negative.   Respiratory: Negative.    Cardiovascular: Negative.   Gastrointestinal: Negative.   Musculoskeletal:  Positive for myalgias. Negative for arthralgias, back pain, gait problem, joint swelling, neck pain and neck stiffness.  Psychiatric/Behavioral: Negative.      Per HPI unless specifically indicated above     Objective:    BP 102/66   Pulse 63   Temp 98.4 F (36.9 C)   Wt 227 lb 12.8 oz (103.3 kg)   SpO2 96%   BMI 37.91 kg/m   Wt Readings from Last 3 Encounters:  07/13/21 227 lb 12.8 oz (103.3 kg)  04/21/21 227 lb 6.4 oz (103.1 kg)  11/30/20 221 lb 12.8 oz (100.6 kg)    Physical  Exam Vitals and nursing note reviewed.  Constitutional:      General: She is not in acute distress.    Appearance: Normal appearance. She is obese. She is not ill-appearing, toxic-appearing or diaphoretic.  HENT:     Head: Normocephalic and atraumatic.     Right Ear: External ear normal.     Left Ear: External ear normal.     Nose: Nose normal.     Mouth/Throat:     Mouth: Mucous membranes are moist.     Pharynx: Oropharynx is clear.  Eyes:     General: No scleral icterus.       Right eye: No discharge.        Left eye: No discharge.     Extraocular Movements: Extraocular movements intact.     Conjunctiva/sclera: Conjunctivae normal.     Pupils: Pupils are equal, round, and reactive to light.  Cardiovascular:     Rate and Rhythm: Normal rate and regular rhythm.     Pulses: Normal pulses.     Heart sounds: Normal heart sounds. No murmur heard.    No friction rub. No gallop.  Pulmonary:     Effort: Pulmonary effort is normal. No respiratory distress.     Breath sounds: Normal breath sounds. No stridor. No wheezing, rhonchi or rales.  Chest:     Chest wall: No tenderness.  Musculoskeletal:        General: Tenderness present. Normal range of motion.     Cervical back: Normal range of motion and neck supple.  Skin:    General: Skin is warm and dry.     Capillary Refill: Capillary refill takes less than 2 seconds.     Coloration: Skin is not jaundiced or pale.     Findings: No bruising, erythema, lesion or rash.  Neurological:     General: No focal deficit present.     Mental Status: She is alert and oriented to person, place, and time. Mental status is at baseline.  Psychiatric:        Mood and Affect: Mood normal.        Behavior: Behavior normal.        Thought Content: Thought content normal.        Judgment: Judgment normal.     Results for orders placed or performed in visit on 04/21/21  Comp Met (CMET)  Result Value Ref Range   Glucose 91 70 - 99 mg/dL   BUN 10 8  - 27 mg/dL   Creatinine, Ser 1.09 (H) 0.57 - 1.00 mg/dL   eGFR 56 (L) >59 mL/min/1.73   BUN/Creatinine Ratio 9 (L) 12 - 28   Sodium 140 134 - 144 mmol/L   Potassium 4.2 3.5 - 5.2 mmol/L   Chloride 103 96 - 106 mmol/L   CO2 24 20 - 29 mmol/L   Calcium 9.3 8.7 - 10.3 mg/dL   Total Protein 7.0 6.0 - 8.5 g/dL   Albumin 4.2 3.8 - 4.8 g/dL   Globulin, Total 2.8 1.5 - 4.5 g/dL   Albumin/Globulin Ratio 1.5 1.2 - 2.2   Bilirubin Total 0.6 0.0 - 1.2 mg/dL   Alkaline Phosphatase 93 44 - 121 IU/L   AST 18 0 - 40 IU/L   ALT 21 0 - 32 IU/L  TSH  Result Value Ref Range   TSH 4.950 (H) 0.450 - 4.500 uIU/mL  T4, free  Result Value Ref Range   Free T4 0.98 0.82 - 1.77 ng/dL  HgB A1c  Result Value Ref Range   Hgb A1c MFr Bld 6.2 (H) 4.8 - 5.6 %   Est. average glucose Bld gHb Est-mCnc 131 mg/dL  Lipid Profile  Result Value Ref Range   Cholesterol, Total 157 100 - 199 mg/dL   Triglycerides 155 (H) 0 - 149 mg/dL   HDL 39 (L) >39 mg/dL   VLDL Cholesterol Cal 27 5 - 40 mg/dL   LDL Chol Calc (NIH) 91 0 - 99 mg/dL   Chol/HDL Ratio 4.0 0.0 - 4.4 ratio      Assessment & Plan:   Problem List Items Addressed This Visit   None Visit Diagnoses     Bone spur of left foot    -  Primary   Will take ibuprofen and do stretches. Referral to podiatry placed today.   Relevant Orders   Ambulatory referral to Podiatry        Follow up plan: Return as scheduled.

## 2021-07-13 NOTE — Telephone Encounter (Signed)
Requested Prescriptions  Pending Prescriptions Disp Refills  . albuterol (VENTOLIN HFA) 108 (90 Base) MCG/ACT inhaler [Pharmacy Med Name: ALBUTEROL HFA 90MCG/ACT (PA)] 17 g 2    Sig: USE 1 INHALATION BY MOUTH  EVERY 6 HOURS AS NEEDED     Pulmonology:  Beta Agonists 2 Passed - 07/12/2021 10:40 PM      Passed - Last BP in normal range    BP Readings from Last 1 Encounters:  04/21/21 116/63         Passed - Last Heart Rate in normal range    Pulse Readings from Last 1 Encounters:  04/21/21 72         Passed - Valid encounter within last 12 months    Recent Outpatient Visits          2 months ago Stage 3 chronic kidney disease, unspecified whether stage 3a or 3b CKD (HCC)   [facility] Larae Grooms, NP   7 months ago Annual physical exam   [facility] Larae Grooms, NP   1 year ago Essential hypertension   [facility] Larae Grooms, NP   1 year ago Severe depression [facility])   [facility] Marjie Skiff, NP   1 year ago Chronic anxiety   [facility] [place], Salley Hews, [place]      Future Appointments            Today Dorcas Carrow, DO [facility], [facility]   In 3 months Larae Grooms, NP [facility], [facility]

## 2021-08-03 ENCOUNTER — Ambulatory Visit: Payer: Medicare Other | Admitting: Podiatry

## 2021-08-03 ENCOUNTER — Ambulatory Visit (INDEPENDENT_AMBULATORY_CARE_PROVIDER_SITE_OTHER): Payer: Medicare Other

## 2021-08-03 ENCOUNTER — Encounter: Payer: Self-pay | Admitting: Podiatry

## 2021-08-03 DIAGNOSIS — M722 Plantar fascial fibromatosis: Secondary | ICD-10-CM

## 2021-08-04 NOTE — Progress Notes (Signed)
Subjective:  Patient ID: Kayla Wright, female    DOB: [birth year],  MRN: [id number]  Chief Complaint  Patient presents with   Foot Pain    "My left foot hurts on the bottom."    66 y.o. female presents with the above complaint.  Patient presents with complaint left heel pain that has been going for quite some time is progressive gotten worse.  Patient states it hurts with ambulation.  Now 2 months suddenly sharp pain hurts with walking standing she has not tried any treatment options for it.  She wanted to get it evaluated by me.  She has not seen anyone as prior to seeing me.   Review of Systems: Negative except as noted in the HPI. Denies N/V/F/Ch.  Past Medical History:  Diagnosis Date   Allergy    Common migraine    Depression    GERD (gastroesophageal reflux disease)    Hyperlipidemia    Hypertension    Insomnia    Menopausal symptom    Osteopenia    Thyroid disease     Current Outpatient Medications:    albuterol (VENTOLIN HFA) 108 (90 Base) MCG/ACT inhaler, USE 1 INHALATION BY MOUTH  EVERY 6 HOURS AS NEEDED, Disp: 17 g, Rfl: 2   Ascorbic Acid (VITAMIN C) 1000 MG tablet, Take 1 tablet (1,000 mg total) by mouth daily., Disp: 90 tablet, Rfl: 4   Cholecalciferol (VITAMIN D3) 125 MCG (5000 UT) CAPS, Take 1 capsule (5,000 Units total) by mouth daily., Disp: 90 capsule, Rfl: 4   cloNIDine (CATAPRES) 0.2 MG tablet, TAKE 1 TABLET BY MOUTH 3 TIMES  DAILY, Disp: 270 tablet, Rfl: 3   Cyanocobalamin (VITAMIN B-12 PO), Take 1 tablet by mouth daily., Disp: , Rfl:    FLUoxetine (PROZAC) 20 MG capsule, TAKE 1 CAPSULE BY MOUTH DAILY  WITH A 40 MG CAPSULE FOR TOTAL  60 MG DAILY, Disp: 90 capsule, Rfl: 3   FLUoxetine (PROZAC) 40 MG capsule, TAKE 1 CAPSULE BY MOUTH DAILY, Disp: 90 capsule, Rfl: 1   hydrOXYzine (ATARAX) 25 MG tablet, TAKE 1 TABLET BY MOUTH AT  BEDTIME AS NEEDED, Disp: 90 tablet, Rfl: 1   levothyroxine (SYNTHROID) 50 MCG tablet, TAKE 1 TABLET BY MOUTH DAILY  BEFORE BREAKFAST,  Disp: 30 tablet, Rfl: 11   nystatin cream (MYCOSTATIN), Apply 1 application. topically 2 (two) times daily., Disp: 60 g, Rfl: 1   oxyCODONE-acetaminophen (PERCOCET/ROXICET) 5-325 MG tablet, Take 1 tablet by mouth 4 (four) times daily as needed., Disp: , Rfl:    QUEtiapine Fumarate (SEROQUEL XR) 150 MG 24 hr tablet, TAKE 1 TABLET BY MOUTH AT  BEDTIME, Disp: 30 tablet, Rfl: 11   simvastatin (ZOCOR) 40 MG tablet, TAKE 1 TABLET BY MOUTH DAILY, Disp: 30 tablet, Rfl: 11  Social History   Tobacco Use  Smoking Status Never  Smokeless Tobacco Never    No Known Allergies Objective:  There were no vitals filed for this visit. There is no height or weight on file to calculate BMI. Constitutional Well developed. Well nourished.  Vascular Dorsalis pedis pulses palpable bilaterally. Posterior tibial pulses palpable bilaterally. Capillary refill normal to all digits.  No cyanosis or clubbing noted. Pedal hair growth normal.  Neurologic Normal speech. Oriented to person, place, and time. Epicritic sensation to light touch grossly present bilaterally.  Dermatologic Nails well groomed and normal in appearance. No open wounds. No skin lesions.  Orthopedic: Normal joint ROM without pain or crepitus bilaterally. No visible deformities. Tender to palpation at the calcaneal  tuber left. No pain with calcaneal squeeze left. Ankle ROM diminished range of motion left. Silfverskiold Test: positive left.   Radiographs: Taken and reviewed. No acute fractures or dislocations. No evidence of stress fracture.  Plantar heel spur present. Posterior heel spur present.   Assessment:   1. Plantar fasciitis of left foot    Plan:  Patient was evaluated and treated and all questions answered.  Plantar Fasciitis, left - XR reviewed as above.  - Educated on icing and stretching. Instructions given.  - Injection delivered to the plantar fascia as below. - DME: Plantar fascial brace dispensed to support the  medial longitudinal arch of the foot and offload pressure from the heel and prevent arch collapse during weightbearing - Pharmacologic management: None  Procedure: Injection Tendon/Ligament Location: Left plantar fascia at the glabrous junction; medial approach. Skin Prep: alcohol Injectate: 0.5 cc 0.5% marcaine plain, 0.5 cc of 1% Lidocaine, 0.5 cc kenalog 10. Disposition: Patient tolerated procedure well. Injection site dressed with a band-aid.  No follow-ups on file.

## 2021-08-09 ENCOUNTER — Other Ambulatory Visit: Payer: Self-pay | Admitting: Nurse Practitioner

## 2021-08-10 NOTE — Telephone Encounter (Signed)
Requested Prescriptions  Pending Prescriptions Disp Refills  . hydrOXYzine (ATARAX) 25 MG tablet [Pharmacy Med Name: hydrOXYzine HCl 25 MG Oral Tablet] 90 tablet 1    Sig: TAKE 1 TABLET BY MOUTH AT  BEDTIME AS NEEDED     Ear, Nose, and Throat:  Antihistamines 2 Failed - 08/09/2021  1:21 PM      Failed - Cr in normal range and within 360 days    Creatinine, Ser  Date Value Ref Range Status  04/21/2021 1.09 (H) 0.57 - 1.00 mg/dL Final         Passed - Valid encounter within last 12 months    Recent Outpatient Visits          4 weeks ago Bone spur of left foot   [facility], Megan P, DO   3 months ago Stage 3 chronic kidney disease, unspecified whether stage 3a or 3b CKD (HCC)   [facility] Larae Grooms, NP   8 months ago Annual physical exam   [facility] Larae Grooms, NP   1 year ago Essential hypertension   [facility] Larae Grooms, NP   1 year ago Severe depression [facility])   [facility] Marjie Skiff, NP      Future Appointments            In 2 months Larae Grooms, NP [facility], [facility]

## 2021-08-31 ENCOUNTER — Ambulatory Visit: Payer: Medicare Other | Admitting: Podiatry

## 2021-09-02 ENCOUNTER — Ambulatory Visit: Payer: Medicare Other | Admitting: Podiatry

## 2021-09-02 DIAGNOSIS — M25372 Other instability, left ankle: Secondary | ICD-10-CM

## 2021-09-02 DIAGNOSIS — M722 Plantar fascial fibromatosis: Secondary | ICD-10-CM

## 2021-09-02 NOTE — Progress Notes (Signed)
Subjective:  Patient ID: Kayla Wright, female    DOB: 1955-03-31,  MRN: [id number]  Chief Complaint  Patient presents with   Plantar Fasciitis    66 y.o. female presents with the above complaint.  Patient presents with follow-up to left Planter fasciitis.  She states she is doing a lot better.  Has improved she still has some residual pain.  She also has some ankle instability for for which she wears AFO brace.  AFO braces to wear down she would like to know if she can get another brace.  She denies any other acute complaints.   Review of Systems: Negative except as noted in the HPI. Denies N/V/F/Ch.  Past Medical History:  Diagnosis Date   Allergy    Common migraine    Depression    GERD (gastroesophageal reflux disease)    Hyperlipidemia    Hypertension    Insomnia    Menopausal symptom    Osteopenia    Thyroid disease     Current Outpatient Medications:    albuterol (VENTOLIN HFA) 108 (90 Base) MCG/ACT inhaler, USE 1 INHALATION BY MOUTH  EVERY 6 HOURS AS NEEDED, Disp: 17 g, Rfl: 2   Ascorbic Acid (VITAMIN C) 1000 MG tablet, Take 1 tablet (1,000 mg total) by mouth daily., Disp: 90 tablet, Rfl: 4   Cholecalciferol (VITAMIN D3) 125 MCG (5000 UT) CAPS, Take 1 capsule (5,000 Units total) by mouth daily., Disp: 90 capsule, Rfl: 4   cloNIDine (CATAPRES) 0.2 MG tablet, TAKE 1 TABLET BY MOUTH 3 TIMES  DAILY, Disp: 270 tablet, Rfl: 3   Cyanocobalamin (VITAMIN B-12 PO), Take 1 tablet by mouth daily., Disp: , Rfl:    FLUoxetine (PROZAC) 20 MG capsule, TAKE 1 CAPSULE BY MOUTH DAILY  WITH A 40 MG CAPSULE FOR TOTAL  60 MG DAILY, Disp: 90 capsule, Rfl: 3   FLUoxetine (PROZAC) 40 MG capsule, TAKE 1 CAPSULE BY MOUTH DAILY, Disp: 90 capsule, Rfl: 1   hydrOXYzine (ATARAX) 25 MG tablet, TAKE 1 TABLET BY MOUTH AT  BEDTIME AS NEEDED, Disp: 90 tablet, Rfl: 1   levothyroxine (SYNTHROID) 50 MCG tablet, TAKE 1 TABLET BY MOUTH DAILY  BEFORE BREAKFAST, Disp: 30 tablet, Rfl: 11   nystatin cream  (MYCOSTATIN), Apply 1 application. topically 2 (two) times daily., Disp: 60 g, Rfl: 1   oxyCODONE-acetaminophen (PERCOCET/ROXICET) 5-325 MG tablet, Take 1 tablet by mouth 4 (four) times daily as needed., Disp: , Rfl:    QUEtiapine Fumarate (SEROQUEL XR) 150 MG 24 hr tablet, TAKE 1 TABLET BY MOUTH AT  BEDTIME, Disp: 30 tablet, Rfl: 11   simvastatin (ZOCOR) 40 MG tablet, TAKE 1 TABLET BY MOUTH DAILY, Disp: 30 tablet, Rfl: 11  Social History   Tobacco Use  Smoking Status Never  Smokeless Tobacco Never    No Known Allergies Objective:  There were no vitals filed for this visit. There is no height or weight on file to calculate BMI. Constitutional Well developed. Well nourished.  Vascular Dorsalis pedis pulses palpable bilaterally. Posterior tibial pulses palpable bilaterally. Capillary refill normal to all digits.  No cyanosis or clubbing noted. Pedal hair growth normal.  Neurologic Normal speech. Oriented to person, place, and time. Epicritic sensation to light touch grossly present bilaterally.  Dermatologic Nails well groomed and normal in appearance. No open wounds. No skin lesions.  Orthopedic: Normal joint ROM without pain or crepitus bilaterally. No visible deformities. Tender to palpation at the calcaneal tuber left. No pain with calcaneal squeeze left. Ankle ROM diminished range  of motion left. Silfverskiold Test: positive left.   Radiographs: Taken and reviewed. No acute fractures or dislocations. No evidence of stress fracture.  Plantar heel spur present. Posterior heel spur present.   Assessment:   1. Plantar fasciitis of left foot   2. Ankle instability, left     Plan:  Patient was evaluated and treated and all questions answered.  Ankle instability left with antalgic gait -I explained to patient the etiology of ankle instability and pressure options were discussed.  Her current brace is starting to wear down and it is not giving her the appropriate reduction  of instability therefore I believe she will benefit from a new AFO brace.  I discussed this with patient she states understanding would like to proceed with obtaining new brace -A prescription was given to [facility] to obtain new bracing  Plantar Fasciitis, left - XR reviewed as above.  - Educated on icing and stretching. Instructions given.  -Second injection delivered to the plantar fascia as below. - DME: Plantar fascial brace dispensed to support the medial longitudinal arch of the foot and offload pressure from the heel and prevent arch collapse during weightbearing - Pharmacologic management: None  Procedure: Injection Tendon/Ligament Location: Left plantar fascia at the glabrous junction; medial approach. Skin Prep: alcohol Injectate: 0.5 cc 0.5% marcaine plain, 0.5 cc of 1% Lidocaine, 0.5 cc kenalog 10. Disposition: Patient tolerated procedure well. Injection site dressed with a band-aid.  No follow-ups on file.

## 2021-09-24 ENCOUNTER — Telehealth: Payer: Self-pay

## 2021-09-24 NOTE — Telephone Encounter (Signed)
Called and LVM asking for patient to please return my call. Patient is overdue for mammogram, need to find out if patient is interested in having once done. We can order and schedule for her.   OK for [facility] to speak to patient and find out the above information. Please also find out if she has a day or time of day preference if she would like to have this done.

## 2021-09-30 DIAGNOSIS — M19072 Primary osteoarthritis, left ankle and foot: Secondary | ICD-10-CM | POA: Diagnosis not present

## 2021-10-01 ENCOUNTER — Encounter: Payer: Self-pay | Admitting: Nurse Practitioner

## 2021-10-18 DIAGNOSIS — N1831 Chronic kidney disease, stage 3a: Secondary | ICD-10-CM | POA: Diagnosis not present

## 2021-10-18 DIAGNOSIS — N183 Chronic kidney disease, stage 3 unspecified: Secondary | ICD-10-CM | POA: Diagnosis not present

## 2021-10-18 DIAGNOSIS — J449 Chronic obstructive pulmonary disease, unspecified: Secondary | ICD-10-CM | POA: Diagnosis not present

## 2021-10-18 DIAGNOSIS — Z9181 History of falling: Secondary | ICD-10-CM | POA: Diagnosis not present

## 2021-10-18 DIAGNOSIS — Z79899 Other long term (current) drug therapy: Secondary | ICD-10-CM | POA: Diagnosis not present

## 2021-10-18 DIAGNOSIS — R030 Elevated blood-pressure reading, without diagnosis of hypertension: Secondary | ICD-10-CM | POA: Diagnosis not present

## 2021-10-18 DIAGNOSIS — M25572 Pain in left ankle and joints of left foot: Secondary | ICD-10-CM | POA: Diagnosis not present

## 2021-10-19 ENCOUNTER — Ambulatory Visit (INDEPENDENT_AMBULATORY_CARE_PROVIDER_SITE_OTHER): Payer: Medicare Other | Admitting: Nurse Practitioner

## 2021-10-19 ENCOUNTER — Encounter: Payer: Self-pay | Admitting: Nurse Practitioner

## 2021-10-19 VITALS — BP 117/68 | HR 90 | Temp 98.9°F | Wt 235.2 lb

## 2021-10-19 DIAGNOSIS — Z114 Encounter for screening for human immunodeficiency virus [HIV]: Secondary | ICD-10-CM

## 2021-10-19 DIAGNOSIS — Z1231 Encounter for screening mammogram for malignant neoplasm of breast: Secondary | ICD-10-CM

## 2021-10-19 DIAGNOSIS — R7301 Impaired fasting glucose: Secondary | ICD-10-CM | POA: Diagnosis not present

## 2021-10-19 DIAGNOSIS — Z23 Encounter for immunization: Secondary | ICD-10-CM | POA: Diagnosis not present

## 2021-10-19 DIAGNOSIS — E038 Other specified hypothyroidism: Secondary | ICD-10-CM

## 2021-10-19 DIAGNOSIS — E785 Hyperlipidemia, unspecified: Secondary | ICD-10-CM | POA: Diagnosis not present

## 2021-10-19 DIAGNOSIS — I10 Essential (primary) hypertension: Secondary | ICD-10-CM | POA: Diagnosis not present

## 2021-10-19 DIAGNOSIS — N183 Chronic kidney disease, stage 3 unspecified: Secondary | ICD-10-CM

## 2021-10-19 DIAGNOSIS — F411 Generalized anxiety disorder: Secondary | ICD-10-CM

## 2021-10-19 DIAGNOSIS — F322 Major depressive disorder, single episode, severe without psychotic features: Secondary | ICD-10-CM | POA: Diagnosis not present

## 2021-10-19 DIAGNOSIS — Z780 Asymptomatic menopausal state: Secondary | ICD-10-CM | POA: Diagnosis not present

## 2021-10-19 NOTE — Assessment & Plan Note (Signed)
Chronic. Exacerbated. Denies SI.  Does not want to change medications at visit today.  Continue with current medication regimen.  Follow up if symptoms worsen or fail to improve.  Follow up in 6 months for reevaluation.

## 2021-10-19 NOTE — Assessment & Plan Note (Signed)
Chronic.  Controlled.  Continue with current medication regimen of Simvastatin.  Labs ordered today.  Return to clinic in 6 months for reevaluation.  Call sooner if concerns arise.   

## 2021-10-19 NOTE — Assessment & Plan Note (Signed)
Chronic. Exacerbated. Denies SI.  Does not want to change medications at visit today.  Continue with current medication regimen.  Follow up if symptoms worsen or fail to improve.  Follow up in 6 months for reevaluation.  

## 2021-10-19 NOTE — Progress Notes (Signed)
BP 117/68   Pulse 90   Temp 98.9 F (37.2 C) (Oral)   Wt 235 lb 3.2 oz (106.7 kg)   SpO2 95%   BMI 39.14 kg/m    Subjective:    Patient ID: Kayla Wright, female    DOB: Apr 17, 1955, 66 y.o.   MRN: [id number]  HPI: Kayla Wright is a 66 y.o. female  Chief Complaint  Patient presents with   Hyperlipidemia   Diabetes   Hypertension    6 month follow up    HYPERTENSION / HYPERLIPIDEMIA Satisfied with current treatment? no Duration of hypertension: years BP monitoring frequency: not checking BP range:  BP medication side effects: no Past BP meds: clonidine Duration of hyperlipidemia: chronic Cholesterol medication side effects: no Cholesterol supplements: none Past cholesterol medications: simvastatin Medication compliance:  excellent Aspirin: no Recent stressors: no Recurrent headaches: no Visual changes: no Palpitations: no Dyspnea: no Chest pain: no Lower extremity edema: no Dizzy/lightheaded: no  HYPOTHYROIDISM Thyroid control status:controlled Satisfied with current treatment? no Medication side effects: no Medication compliance: excellent compliance Etiology of hypothyroidism:  Recent dose adjustment:yes Fatigue: no Cold intolerance: no Heat intolerance: no Weight gain: no Weight loss: no Constipation: no Diarrhea/loose stools: no Palpitations: no Lower extremity edema: no Anxiety/depressed mood: no  ANXIETY/DEPRESSION Patient states her mood has been so so.  She states she has more stress going on in her life.  She feels like she would like to levae her medication the same as they have been.  Denies concerns regarding her mood at visit today.   [place] Office Visit from 10/19/2021 in [place]  PHQ-9 Total Score 18         10/19/2021    2:54 PM 07/13/2021    2:31 PM 11/30/2020    2:06 PM 05/15/2020    2:22 PM  GAD 7 : Generalized Anxiety Score  Nervous, Anxious, on Edge _0 0  Control/stop worrying _1 Worry too  much - different things _2 Trouble relaxing _3 0  Restless _4 0  Easily annoyed or irritable _5 Afraid - awful might happen _6 0  Total GAD 7 Score _7 Anxiety Difficulty Somewhat difficult Somewhat difficult Somewhat difficult Not difficult at all     Patient states she has a rash under her belly.  It is red and painful.  Relevant past medical, surgical, family and social history reviewed and updated as indicated. Interim medical history since our last visit reviewed. Allergies and medications reviewed and updated.  Review of Systems  Constitutional:  Negative for fever and unexpected weight change.  Eyes:  Negative for visual disturbance.  Respiratory:  Negative for cough, chest tightness and shortness of breath.   Cardiovascular:  Negative for chest pain, palpitations and leg swelling.  Endocrine: Negative for cold intolerance.  Skin:  Positive for rash.  Neurological:  Negative for dizziness and headaches.  Psychiatric/Behavioral:  Positive for dysphoric mood. The patient is nervous/anxious.     Per HPI unless specifically indicated above     Objective:    BP 117/68   Pulse 90   Temp 98.9 F (37.2 C) (Oral)   Wt 235 lb 3.2 oz (106.7 kg)   SpO2 95%   BMI 39.14 kg/m   Wt Readings from Last 3 Encounters:  10/19/21 235 lb 3.2 oz (106.7 kg)  07/13/21 227 lb 12.8 oz (  103.3 kg)  04/21/21 227 lb 6.4 oz (103.1 kg)    Physical Exam Vitals and nursing note reviewed.  Constitutional:      General: She is not in acute distress.    Appearance: Normal appearance. She is obese. She is not ill-appearing, toxic-appearing or diaphoretic.  HENT:     Head: Normocephalic.     Right Ear: External ear normal.     Left Ear: External ear normal.     Nose: Nose normal.     Mouth/Throat:     Mouth: Mucous membranes are moist.     Pharynx: Oropharynx is clear.  Eyes:     General:        Right eye: No discharge.        Left eye: No discharge.      Extraocular Movements: Extraocular movements intact.     Conjunctiva/sclera: Conjunctivae normal.     Pupils: Pupils are equal, round, and reactive to light.  Cardiovascular:     Rate and Rhythm: Normal rate and regular rhythm.     Heart sounds: No murmur heard. Pulmonary:     Effort: Pulmonary effort is normal. No respiratory distress.     Breath sounds: Normal breath sounds. No wheezing or rales.  Musculoskeletal:     Cervical back: Normal range of motion and neck supple.  Skin:    General: Skin is warm and dry.     Capillary Refill: Capillary refill takes less than 2 seconds.  Neurological:     General: No focal deficit present.     Mental Status: She is alert and oriented to person, place, and time. Mental status is at baseline.  Psychiatric:        Mood and Affect: Mood normal.        Behavior: Behavior normal.        Thought Content: Thought content normal.        Judgment: Judgment normal.     Results for orders placed or performed in visit on 04/21/21  Comp Met (CMET)  Result Value Ref Range   Glucose 91 70 - 99 mg/dL   BUN 10 8 - 27 mg/dL   Creatinine, Ser 1.09 (H) 0.57 - 1.00 mg/dL   eGFR 56 (L) >59 mL/min/1.73   BUN/Creatinine Ratio 9 (L) 12 - 28   Sodium 140 134 - 144 mmol/L   Potassium 4.2 3.5 - 5.2 mmol/L   Chloride 103 96 - 106 mmol/L   CO2 24 20 - 29 mmol/L   Calcium 9.3 8.7 - 10.3 mg/dL   Total Protein 7.0 6.0 - 8.5 g/dL   Albumin 4.2 3.8 - 4.8 g/dL   Globulin, Total 2.8 1.5 - 4.5 g/dL   Albumin/Globulin Ratio 1.5 1.2 - 2.2   Bilirubin Total 0.6 0.0 - 1.2 mg/dL   Alkaline Phosphatase 93 44 - 121 IU/L   AST 18 0 - 40 IU/L   ALT 21 0 - 32 IU/L  TSH  Result Value Ref Range   TSH 4.950 (H) 0.450 - 4.500 uIU/mL  T4, free  Result Value Ref Range   Free T4 0.98 0.82 - 1.77 ng/dL  HgB A1c  Result Value Ref Range   Hgb A1c MFr Bld 6.2 (H) 4.8 - 5.6 %   Est. average glucose Bld gHb Est-mCnc 131 mg/dL  Lipid Profile  Result Value Ref Range    Cholesterol, Total 157 100 - 199 mg/dL   Triglycerides 155 (H) 0 - 149 mg/dL   HDL 39 (L) >39 mg/dL  VLDL Cholesterol Cal 27 5 - 40 mg/dL   LDL Chol Calc (NIH) 91 0 - 99 mg/dL   Chol/HDL Ratio 4.0 0.0 - 4.4 ratio      Assessment & Plan:   Problem List Items Addressed This Visit       Cardiovascular and Mediastinum   Essential hypertension    Chronic.  Controlled.  Continue with current medication regimen of Clonidine.  Labs ordered today.  Return to clinic in 6 months for reevaluation.  Call sooner if concerns arise.        Relevant Orders   Comp Met (CMET)     Endocrine   Hypothyroidism    Labs ordered today. Will make recommendations based on lab results. Continue with current dose of Levothyroxine. Follow up in 6 months.  Call sooner if concerns arise.       Relevant Orders   TSH   T4, free   IFG (impaired fasting glucose)    Labs ordered at visit today.  Will make recommendations based on lab results.        Relevant Orders   HgB A1c     Genitourinary   CKD (chronic kidney disease) stage 3, GFR 30-59 ml/min (HCC) - Primary    Labs ordered at visit today.  Will make recommendations based on lab results.           Other   Severe depression (HCC)    Chronic. Exacerbated. Denies SI.  Does not want to change medications at visit today.  Continue with current medication regimen.  Follow up if symptoms worsen or fail to improve.  Follow up in 6 months for reevaluation.       Hyperlipidemia    Chronic.  Controlled.  Continue with current medication regimen of Simvastatin.  Labs ordered today.  Return to clinic in 6 months for reevaluation.  Call sooner if concerns arise.        Relevant Orders   Lipid Profile   Anxiety disorder    Chronic. Exacerbated. Denies SI.  Does not want to change medications at visit today.  Continue with current medication regimen.  Follow up if symptoms worsen or fail to improve.  Follow up in 6 months for reevaluation.        Other Visit Diagnoses     Screening for HIV (human immunodeficiency virus)       Encounter for screening mammogram for malignant neoplasm of breast       Relevant Orders   MM 3D SCREEN BREAST BILATERAL   Need for influenza vaccination       Relevant Orders   Flu Vaccine QUAD High Dose(Fluad)   Need for pneumococcal 20-valent conjugate vaccination       Relevant Orders   Pneumococcal conjugate vaccine 20-valent (Prevnar 20)   Post-menopausal       Relevant Orders   DG Bone Density        Follow up plan: No follow-ups on file.

## 2021-10-19 NOTE — Assessment & Plan Note (Signed)
Chronic.  Controlled.  Continue with current medication regimen of Clonidine.  Labs ordered today.  Return to clinic in 6 months for reevaluation.  Call sooner if concerns arise.

## 2021-10-19 NOTE — Assessment & Plan Note (Signed)
Labs ordered at visit today.  Will make recommendations based on lab results.   

## 2021-10-19 NOTE — Assessment & Plan Note (Signed)
Labs ordered today. Will make recommendations based on lab results. Continue with current dose of Levothyroxine. Follow up in 6 months.  Call sooner if concerns arise.

## 2021-10-20 ENCOUNTER — Telehealth: Payer: Self-pay

## 2021-10-20 LAB — TSH: TSH: 2.26 u[IU]/mL (ref 0.450–4.500)

## 2021-10-20 LAB — COMPREHENSIVE METABOLIC PANEL
ALT: 24 IU/L (ref 0–32)
AST: 20 IU/L (ref 0–40)
Albumin/Globulin Ratio: 2 (ref 1.2–2.2)
Albumin: 4.3 g/dL (ref 3.9–4.9)
Alkaline Phosphatase: 82 IU/L (ref 44–121)
BUN/Creatinine Ratio: 10 — ABNORMAL LOW (ref 12–28)
BUN: 11 mg/dL (ref 8–27)
Bilirubin Total: 0.8 mg/dL (ref 0.0–1.2)
CO2: 22 mmol/L (ref 20–29)
Calcium: 9.3 mg/dL (ref 8.7–10.3)
Chloride: 102 mmol/L (ref 96–106)
Creatinine, Ser: 1.05 mg/dL — ABNORMAL HIGH (ref 0.57–1.00)
Globulin, Total: 2.2 g/dL (ref 1.5–4.5)
Glucose: 117 mg/dL — ABNORMAL HIGH (ref 70–99)
Potassium: 4 mmol/L (ref 3.5–5.2)
Sodium: 139 mmol/L (ref 134–144)
Total Protein: 6.5 g/dL (ref 6.0–8.5)
eGFR: 59 mL/min/{1.73_m2} — ABNORMAL LOW (ref >59)

## 2021-10-20 LAB — LIPID PANEL
Chol/HDL Ratio: 4.2 ratio (ref 0.0–4.4)
Cholesterol, Total: 162 mg/dL (ref 100–199)
HDL: 39 mg/dL — ABNORMAL LOW (ref >39)
LDL Chol Calc (NIH): 92 mg/dL (ref 0–99)
Triglycerides: 181 mg/dL — ABNORMAL HIGH (ref 0–149)
VLDL Cholesterol Cal: 31 mg/dL (ref 5–40)

## 2021-10-20 LAB — HEMOGLOBIN A1C
Est. average glucose Bld gHb Est-mCnc: 140 mg/dL
Hgb A1c MFr Bld: 6.5 % — ABNORMAL HIGH (ref 4.8–5.6)

## 2021-10-20 LAB — T4, FREE: Free T4: 0.99 ng/dL (ref 0.82–1.77)

## 2021-10-20 NOTE — Progress Notes (Signed)
Please let patient know that her lab work looks good.  Kidney function remains stable.  A1c did increase slightly and cholesterol is elevated from prior.  I recommend a low fat low carb diet.  Thyroid labs look good.  Continue with current regimen.  No other concerns at this time.  Follow up as discussed.

## 2021-10-20 NOTE — Telephone Encounter (Signed)
Called [facility] in [place], scheduled both the mammogram and bone density for the patient. She is scheduled on 12/14/21 at 2:00 pm to do both exams back to back.    Called and LVM asking for patient to please return my call to be given appointment.    OK for P[facility] to give the patient the above appointment date and time if she calls back.

## 2021-10-20 NOTE — Telephone Encounter (Signed)
-----   Message from Jon Billings, NP sent at 10/19/2021  3:05 PM EDT ----- Can we make her Mammogram and Bone Density appt?

## 2021-10-22 ENCOUNTER — Ambulatory Visit: Payer: Medicare Other | Admitting: Nurse Practitioner

## 2021-10-25 DIAGNOSIS — J208 Acute bronchitis due to other specified organisms: Secondary | ICD-10-CM | POA: Diagnosis not present

## 2021-10-25 DIAGNOSIS — J441 Chronic obstructive pulmonary disease with (acute) exacerbation: Secondary | ICD-10-CM | POA: Diagnosis not present

## 2021-10-25 DIAGNOSIS — Z20822 Contact with and (suspected) exposure to covid-19: Secondary | ICD-10-CM | POA: Diagnosis not present

## 2021-11-01 ENCOUNTER — Encounter: Payer: Self-pay | Admitting: Physician Assistant

## 2021-11-01 ENCOUNTER — Ambulatory Visit (INDEPENDENT_AMBULATORY_CARE_PROVIDER_SITE_OTHER): Payer: Medicare Other | Admitting: Physician Assistant

## 2021-11-01 VITALS — BP 110/74 | HR 64 | Temp 98.8°F | Ht 65.0 in | Wt 235.1 lb

## 2021-11-01 DIAGNOSIS — J4541 Moderate persistent asthma with (acute) exacerbation: Secondary | ICD-10-CM | POA: Diagnosis not present

## 2021-11-01 DIAGNOSIS — J309 Allergic rhinitis, unspecified: Secondary | ICD-10-CM

## 2021-11-01 MED ORDER — FLUTICASONE-SALMETEROL 45-21 MCG/ACT IN AERO: 2.0000 | INHALATION_SPRAY | Freq: Two times a day (BID) | RESPIRATORY_TRACT | 1 refills | Status: DC

## 2021-11-01 NOTE — Progress Notes (Signed)
Acute Office Visit   Patient: Kayla Wright   DOB: 12-24-1955   66 y.o. Female  MRN: [id number] Visit Date: 11/01/2021  Today's healthcare provider: Dani Gobble Raheel Kunkle, PA-C  Introduced myself to the patient as a Journalist, newspaper and provided education on APPs in clinical practice.    Chief Complaint  Patient presents with   Cough    Has had for 2 weeks, patients says it is a dry heaving cough   Subjective    Cough Associated symptoms include wheezing. Pertinent negatives include no chills, ear pain, fever, rhinorrhea or shortness of breath.   HPI     Cough    Additional comments: Has had for 2 weeks, patients says it is a dry heaving cough      Last edited by Jerelene Redden, CMA on 11/01/2021  2:17 PM.       States she has had ongoing dry cough for the past 2 weeks States she went to [facility] on Monday and was given cough medicine but states it didn't work She denies recent sick contacts  She states she got her pneumonia shot and flu shot about 2 weeks ago - reports ongoing arm soreness  She is using her albuterol inhaler 2-3 times per day - does not report improvement in cough with use  She states the walk-in clinic checked her for [place] and it was negative   Medications: Outpatient Medications Prior to Visit  Medication Sig   albuterol (VENTOLIN HFA) 108 (90 Base) MCG/ACT inhaler USE 1 INHALATION BY MOUTH  EVERY 6 HOURS AS NEEDED   Ascorbic Acid (VITAMIN C) 1000 MG tablet Take 1 tablet (1,000 mg total) by mouth daily.   Cholecalciferol (VITAMIN D3) 125 MCG (5000 UT) CAPS Take 1 capsule (5,000 Units total) by mouth daily.   cloNIDine (CATAPRES) 0.2 MG tablet TAKE 1 TABLET BY MOUTH 3 TIMES  DAILY   Cyanocobalamin (VITAMIN B-12 PO) Take 1 tablet by mouth daily.   FLUoxetine (PROZAC) 20 MG capsule TAKE 1 CAPSULE BY MOUTH DAILY  WITH A 40 MG CAPSULE FOR TOTAL  60 MG DAILY   FLUoxetine (PROZAC) 40 MG capsule TAKE 1 CAPSULE BY MOUTH DAILY   hydrOXYzine (ATARAX) 25 MG tablet TAKE 1  TABLET BY MOUTH AT  BEDTIME AS NEEDED   levothyroxine (SYNTHROID) 50 MCG tablet TAKE 1 TABLET BY MOUTH DAILY  BEFORE BREAKFAST   nystatin cream (MYCOSTATIN) Apply 1 application. topically 2 (two) times daily.   oxyCODONE-acetaminophen (PERCOCET/ROXICET) 5-325 MG tablet Take 1 tablet by mouth 4 (four) times daily as needed.   QUEtiapine Fumarate (SEROQUEL XR) 150 MG 24 hr tablet TAKE 1 TABLET BY MOUTH AT  BEDTIME   simvastatin (ZOCOR) 40 MG tablet TAKE 1 TABLET BY MOUTH DAILY   [DISCONTINUED] benzonatate (TESSALON) 200 MG capsule Take by mouth.   No facility-administered medications prior to visit.    Review of Systems  Constitutional:  Negative for chills, diaphoresis, fatigue and fever.  HENT:  Negative for congestion, ear pain and rhinorrhea.   Respiratory:  Positive for cough and wheezing. Negative for chest tightness and shortness of breath.   Gastrointestinal:  Negative for diarrhea, nausea and vomiting.       Objective    BP 110/74   Pulse 64   Temp 98.8 F (37.1 C) (Oral)   Ht 5\' 5"  (1.651 m)   Wt 235 lb 1.6 oz (106.6 kg)   SpO2 97%   BMI 39.12 kg/m  Physical Exam Vitals reviewed.  Constitutional:      General: She is awake.     Appearance: Normal appearance. She is well-developed and well-groomed. She is obese.  HENT:     Head: Normocephalic and atraumatic.  Cardiovascular:     Rate and Rhythm: Normal rate and regular rhythm.     Heart sounds: Normal heart sounds. No murmur heard.    No friction rub. No gallop.  Pulmonary:     Effort: Pulmonary effort is normal.     Breath sounds: Normal breath sounds. Decreased air movement present. No wheezing, rhonchi or rales.  Neurological:     General: No focal deficit present.     Mental Status: She is alert and oriented to person, place, and time.     GCS: GCS eye subscore is 4. GCS verbal subscore is 5. GCS motor subscore is 6.  Psychiatric:        Behavior: Behavior is cooperative.       No results found for  any visits on 11/01/21.  Assessment & Plan      No follow-ups on file.       Problem List Items Addressed This Visit       Respiratory   Allergic rhinitis    Chronic, recurrent Suspect current dry cough is secondary to allergy and asthma exacerbation  Recommend adding antihistamine daily during allergy season and will add Advair to daily regimen with goal of improving cough and wheezing Discussed asthma exacerbations and she can dc advair once she is feeling back to normal Follow up as needed for progressing or persistent symptoms.       Asthma - Primary    Chronic, historic condition Concerned for acute exacerbation with dry cough and increased use of Albuterol inhaler (using 2-3 times per day) Will add Advair as controller to assist with acute exacerbation. Can continue to use Albuterol inhaler for SOB and wheezing as needed Recommend adding antihistamine of choice as well given hx of allergies and asthma as well as current season.  Follow up as needed for persistent or progressing symptoms       Relevant Medications   fluticasone-salmeterol (ADVAIR HFA) 45-21 MCG/ACT inhaler     No follow-ups on file.   I, Kham Zuckerman E Rosemary Pentecost, PA-C, have reviewed all documentation for this visit. The documentation on 11/01/21 for the exam, diagnosis, procedures, and orders are all accurate and complete.   Jacquelin Hawking, MHS, PA-C [facility]

## 2021-11-01 NOTE — Patient Instructions (Addendum)
I recommend the following for your cough Please start an antihistamine such as Claritin, Zyrtec, Allegra - the generics of these work just as well as brand   I am sending in a new inhaler that I want you to take daily to help calm this down, continue to use your albuterol inhaler as needed for acute shortness of breath  You can use Dextromethorphan, Robitussin to help with the cough while these are getting in your system.

## 2021-11-01 NOTE — Assessment & Plan Note (Signed)
Chronic, recurrent Suspect current dry cough is secondary to allergy and asthma exacerbation  Recommend adding antihistamine daily during allergy season and will add Advair to daily regimen with goal of improving cough and wheezing Discussed asthma exacerbations and she can dc advair once she is feeling back to normal Follow up as needed for progressing or persistent symptoms.

## 2021-11-01 NOTE — Assessment & Plan Note (Addendum)
Chronic, historic condition Concerned for acute exacerbation with dry cough and increased use of Albuterol inhaler (using 2-3 times per day) Will add Advair as controller to assist with acute exacerbation. Can continue to use Albuterol inhaler for SOB and wheezing as needed Recommend adding antihistamine of choice as well given hx of allergies and asthma as well as current season.  Follow up as needed for persistent or progressing symptoms

## 2021-11-08 ENCOUNTER — Ambulatory Visit (INDEPENDENT_AMBULATORY_CARE_PROVIDER_SITE_OTHER): Payer: Medicare Other | Admitting: Internal Medicine

## 2021-11-08 VITALS — BP 129/74 | HR 79 | Resp 16 | Ht 68.0 in | Wt 234.6 lb

## 2021-11-08 DIAGNOSIS — E669 Obesity, unspecified: Secondary | ICD-10-CM | POA: Diagnosis not present

## 2021-11-08 DIAGNOSIS — Z7189 Other specified counseling: Secondary | ICD-10-CM | POA: Diagnosis not present

## 2021-11-08 DIAGNOSIS — G4733 Obstructive sleep apnea (adult) (pediatric): Secondary | ICD-10-CM | POA: Diagnosis not present

## 2021-11-08 DIAGNOSIS — I10 Essential (primary) hypertension: Secondary | ICD-10-CM | POA: Diagnosis not present

## 2021-11-08 NOTE — Patient Instructions (Signed)

## 2021-11-08 NOTE — Progress Notes (Signed)
[facility] [place], [place] [address]  Pulmonary Sleep Medicine   Office Visit Note  Patient Name: Kayla Wright DOB: [birth year] MRN [id number]    Chief Complaint: Obstructive Sleep Apnea visit  Brief History:  Cameron is seen today for an annual follow up on APAP at 5-20 cmh20. The patient has a 3 year history of sleep apnea. Patient is using PAP nightly.  The patient feels rested after sleeping with PAP.  The patient reports benefit from PAP use. Reported sleepiness is improved and the Epworth Sleepiness Score is 9 out of 24. The patient does not take naps. The patient complains of the following: no complaints with her therapy. She does have a cough she has kept for 3 weeks that doctors are evaluating.  The compliance download shows 97% compliance with an average use time of 8 hours 14 minutes. The AHI is 1.4.  The patient does not complain of limb movements disrupting sleep.  ROS  General: (-) fever, (-) chills, (-) night sweat Nose and Sinuses: (-) nasal stuffiness or itchiness, (-) postnasal drip, (-) nosebleeds, (-) sinus trouble. Mouth and Throat: (-) sore throat, (-) hoarseness. Neck: (-) swollen glands, (-) enlarged thyroid, (-) neck pain. Respiratory: + cough, - shortness of breath, - wheezing. Neurologic: - numbness, - tingling. Psychiatric: - anxiety, - depression   Current Medication: Outpatient Encounter Medications as of 11/08/2021  Medication Sig   albuterol (VENTOLIN HFA) 108 (90 Base) MCG/ACT inhaler USE 1 INHALATION BY MOUTH  EVERY 6 HOURS AS NEEDED   Ascorbic Acid (VITAMIN C) 1000 MG tablet Take 1 tablet (1,000 mg total) by mouth daily.   Cholecalciferol (VITAMIN D3) 125 MCG (5000 UT) CAPS Take 1 capsule (5,000 Units total) by mouth daily.   cloNIDine (CATAPRES) 0.2 MG tablet TAKE 1 TABLET BY MOUTH 3 TIMES  DAILY   Cyanocobalamin (VITAMIN B-12 PO) Take 1 tablet by mouth daily.   FLUoxetine (PROZAC) 20 MG capsule TAKE 1 CAPSULE BY MOUTH  DAILY  WITH A 40 MG CAPSULE FOR TOTAL  60 MG DAILY   FLUoxetine (PROZAC) 40 MG capsule TAKE 1 CAPSULE BY MOUTH DAILY   fluticasone-salmeterol (ADVAIR HFA) 45-21 MCG/ACT inhaler Inhale 2 puffs into the lungs 2 (two) times daily.   hydrOXYzine (ATARAX) 25 MG tablet TAKE 1 TABLET BY MOUTH AT  BEDTIME AS NEEDED   levothyroxine (SYNTHROID) 50 MCG tablet TAKE 1 TABLET BY MOUTH DAILY  BEFORE BREAKFAST   nystatin cream (MYCOSTATIN) Apply 1 application. topically 2 (two) times daily.   oxyCODONE-acetaminophen (PERCOCET/ROXICET) 5-325 MG tablet Take 1 tablet by mouth 4 (four) times daily as needed.   QUEtiapine Fumarate (SEROQUEL XR) 150 MG 24 hr tablet TAKE 1 TABLET BY MOUTH AT  BEDTIME   simvastatin (ZOCOR) 40 MG tablet TAKE 1 TABLET BY MOUTH DAILY   No facility-administered encounter medications on file as of 11/08/2021.    Surgical History: Past Surgical History:  Procedure Laterality Date   ANKLE FRACTURE SURGERY  06/18/15   COLONOSCOPY WITH PROPOFOL N/A 08/31/2017   Procedure: COLONOSCOPY WITH PROPOFOL;  Surgeon: Lin Landsman, MD;  Location: [facility] ENDOSCOPY;  Service: Gastroenterology;  Laterality: N/A;   TUBAL LIGATION      Medical History: Past Medical History:  Diagnosis Date   Allergy    Common migraine    Depression    GERD (gastroesophageal reflux disease)    Hyperlipidemia    Hypertension    Insomnia    Menopausal symptom    Osteopenia  Thyroid disease     Family History: Non contributory to the present illness  Social History: Social History   Socioeconomic History   Marital status: Divorced    Spouse name: Not on file   Number of children: Not on file   Years of education: Not on file   Highest education level: Not on file  Occupational History   Occupation: retired  Tobacco Use   Smoking status: Never   Smokeless tobacco: Never  Vaping Use   Vaping Use: Never used  Substance and Sexual Activity   Alcohol use: No    Alcohol/week: 0.0 standard drinks  of alcohol   Drug use: Yes    Types: Oxycodone   Sexual activity: Not Currently    Birth control/protection: Surgical  Other Topics Concern   Not on file  Social History Narrative   Not on file   Social Determinants of Health   Financial Resource Strain: Low Risk  (12/23/2020)   Overall Financial Resource Strain (CARDIA)    Difficulty of Paying Living Expenses: Not hard at all  Food Insecurity: No Food Insecurity (12/23/2019)   Hunger Vital Sign    Worried About Running Out of Food in the Last Year: Never true    [place] in the Last Year: Never true  Transportation Needs: No Transportation Needs (12/23/2019)   PRAPARE - Hydrologist (Medical): No    Lack of Transportation (Non-Medical): No  Physical Activity: Inactive (12/23/2020)   Exercise Vital Sign    Days of Exercise per Week: 0 days    Minutes of Exercise per Session: 0 min  Stress: No Stress Concern Present (12/23/2020)   [place]    Feeling of Stress : Not at all  Social Connections: Moderately Integrated (12/23/2020)   Social Connection and Isolation Panel [NHANES]    Frequency of Communication with Friends and Family: More than three times a week    Frequency of Social Gatherings with Friends and Family: More than three times a week    Attends Religious Services: More than 4 times per year    Active Member of [employer] or Organizations: No    Attends Archivist Meetings: Never    Marital Status: Married  Human resources officer Violence: Not on file    Vital Signs: Blood pressure 129/74, pulse 79, resp. rate 16, height '5\' 8"'  (1.727 m), weight 234 lb 9.6 oz (106.4 kg), SpO2 97 %. Body mass index is 35.67 kg/m.    Examination: General Appearance: The patient is well-developed, well-nourished, and in no distress. Neck Circumference: 49cm Skin: Gross inspection of skin unremarkable. Head: normocephalic, no  gross deformities. Eyes: no gross deformities noted. ENT: ears appear grossly normal Neurologic: Alert and oriented. No involuntary movements.  STOP BANG RISK ASSESSMENT    T (tired) Are you often tired, fatigued, or sleepy during the day?   YES  O (obstruction) Do you stop breathing, choke, or gasp during sleep? NO   P (pressure) Do you have or are you being treated for high blood pressure? NO   B (BMI) Is your body index greater than 35 kg/m? YES   A (age) Are you 65 years old or older? YES   N (neck) Do you have a neck circumference greater than 16 inches?   YES   G (gender) Are you a female? NO   TOTAL STOP/BANG "YES" ANSWERS 4       A STOP-Bang  score of 2 or less is considered low risk, and a score of 5 or more is high risk for having either moderate or severe OSA. For people who score 3 or 4, doctors may need to perform further assessment to determine how likely they are to have OSA.         EPWORTH SLEEPINESS SCALE:  Scale:  (0)= no chance of dozing; (1)= slight chance of dozing; (2)= moderate chance of dozing; (3)= high chance of dozing  Chance  Situtation    Sitting and reading: 3    Watching TV: 3    Sitting Inactive in public: 0    As a passenger in car: 0      Lying down to rest: 3    Sitting and talking: 0    Sitting quielty after lunch: 0    In a car, stopped in traffic: 0   TOTAL SCORE:   9 out of 24    SLEEP STUDIES:  PSG (05/22/18)  AHI 44.2, REM AHI 63.9, SPO2  79%   CPAP COMPLIANCE DATA:  Date Range: 11/03/20 - 11/02/21  Average Daily Use: 8 hours 14 minutes  Median Use: 8 hours 30 minutes  Compliance for > 4 Hours: 355 days  AHI: 1.4 respiratory events per hour  Days Used: 364/365  Mask Leak: 14.7  95th Percentile Pressure: 9.9 cmh20         LABS: Recent Results (from the past 2160 hour(s))  Comp Met (CMET)     Status: Abnormal   Collection Time: 10/19/21  3:12 PM  Result Value Ref Range   Glucose 117 (H) 70 -  99 mg/dL   BUN 11 8 - 27 mg/dL   Creatinine, Ser 1.05 (H) 0.57 - 1.00 mg/dL   eGFR 59 (L) >59 mL/min/1.73   BUN/Creatinine Ratio 10 (L) 12 - 28   Sodium 139 134 - 144 mmol/L   Potassium 4.0 3.5 - 5.2 mmol/L   Chloride 102 96 - 106 mmol/L   CO2 22 20 - 29 mmol/L   Calcium 9.3 8.7 - 10.3 mg/dL   Total Protein 6.5 6.0 - 8.5 g/dL   Albumin 4.3 3.9 - 4.9 g/dL   Globulin, Total 2.2 1.5 - 4.5 g/dL   Albumin/Globulin Ratio 2.0 1.2 - 2.2   Bilirubin Total 0.8 0.0 - 1.2 mg/dL   Alkaline Phosphatase 82 44 - 121 IU/L   AST 20 0 - 40 IU/L   ALT 24 0 - 32 IU/L  TSH     Status: None   Collection Time: 10/19/21  3:12 PM  Result Value Ref Range   TSH 2.260 0.450 - 4.500 uIU/mL  T4, free     Status: None   Collection Time: 10/19/21  3:12 PM  Result Value Ref Range   Free T4 0.99 0.82 - 1.77 ng/dL  Lipid Profile     Status: Abnormal   Collection Time: 10/19/21  3:12 PM  Result Value Ref Range   Cholesterol, Total 162 100 - 199 mg/dL   Triglycerides 181 (H) 0 - 149 mg/dL   HDL 39 (L) >39 mg/dL   VLDL Cholesterol Cal 31 5 - 40 mg/dL   LDL Chol Calc (NIH) 92 0 - 99 mg/dL   Chol/HDL Ratio 4.2 0.0 - 4.4 ratio    Comment:  T. Chol/HDL Ratio                                             Men  Women                               1/2 Avg.Risk  3.4    3.3                                   Avg.Risk  5.0    4.4                                2X Avg.Risk  9.6    7.1                                3X Avg.Risk 23.4   11.0   HgB A1c     Status: Abnormal   Collection Time: 10/19/21  3:12 PM  Result Value Ref Range   Hgb A1c MFr Bld 6.5 (H) 4.8 - 5.6 %    Comment:          Prediabetes: 5.7 - 6.4          Diabetes: >6.4          Glycemic control for adults with diabetes: <7.0    Est. average glucose Bld gHb Est-mCnc 140 mg/dL    Radiology: No results found.  No results found.  No results found.    Assessment and Plan: Patient Active Problem List   Diagnosis  Date Noted   OSA on CPAP 11/09/2020   CPAP use counseling 11/09/2020   CKD (chronic kidney disease) stage 3, GFR 30-59 ml/min (HCC) 01/10/2020   IFG (impaired fasting glucose) 07/17/2019   Essential hypertension 09/27/2017   History of cocaine use 12/06/2016   Pain medication agreement broken 12/06/2016   Neurogenic pain 11/09/2016   Chronic bilateral low back pain with left-sided sciatica 11/09/2016   DDD (degenerative disc disease), cervical (C5-6 and C6-7) 10/11/2016   Chronic Cervical foraminal stenosis (C3-4) (Right) 10/11/2016   Osteopenia of lumbar spine 10/11/2016   DDD (degenerative disc disease), lumbar (L1-2 and L5-S1) 10/11/2016   Chronic lower extremity pain (Left) 08/29/2016   Chronic pain syndrome 08/29/2016   Long term prescription opiate use 08/29/2016   Obesity (BMI 30-39.9) 08/09/2016   Asthma 08/09/2016   Allergic rhinitis 06/17/2016   Insomnia 11/02/2015   Murmur 07/10/2015   Severe depression ([place]) 10/21/2014   Hyperlipidemia 10/21/2014   Hypothyroidism 10/21/2014   Anxiety disorder 10/21/2014   Sleep apnea 07/02/2014   1. OSA on CPAP The patient does tolerate PAP and reports  benefit from PAP use. The patient was reminded how to clean equipment and advised to replace supplies routinely. The patient was also counselled on weight loss. The compliance is excellent. The AHI is 1.4.   OSA on cpap- CPAP continues to be medically necessary to treat this patient's OSA.  Continue with excellent compliance with pap. F/u one year.   2. CPAP use counseling CPAP Counseling: had a lengthy discussion with the patient regarding the importance of PAP therapy in management of the sleep  apnea. Patient appears to understand the risk factor reduction and also understands the risks associated with untreated sleep apnea. Patient will try to make a good faith effort to remain compliant with therapy. Also instructed the patient on proper cleaning of the device including the water  must be changed daily if possible and use of distilled water is preferred. Patient understands that the machine should be regularly cleaned with appropriate recommended cleaning solutions that do not damage the PAP machine for example given white vinegar and water rinses. Other methods such as ozone treatment may not be as good as these simple methods to achieve cleaning.   3. Essential hypertension Hypertension Counseling:   The following hypertensive lifestyle modification were recommended and discussed:  1. Limiting alcohol intake to less than 1 oz/day of ethanol:(24 oz of beer or 8 oz of wine or 2 oz of 100-proof whiskey). 2. Take baby ASA 81 mg daily. 3. Importance of regular aerobic exercise and losing weight. 4. Reduce dietary saturated fat and cholesterol intake for overall cardiovascular health. 5. Maintaining adequate dietary potassium, calcium, and magnesium intake. 6. Regular monitoring of the blood pressure. 7. Reduce sodium intake to less than 100 mmol/day (less than 2.3 gm of sodium or less than 6 gm of sodium choride)    4. Obesity (BMI 30-39.9) Obesity Counseling: Had a lengthy discussion regarding patients BMI and weight issues. Patient was instructed on portion control as well as increased activity. Also discussed caloric restrictions with trying to maintain intake less than 2000 Kcal. Discussions were made in accordance with the 5As of weight management. Simple actions such as not eating late and if able to, taking a walk is suggested.   General Counseling: I have discussed the findings of the evaluation and examination with Rod Holler.  I have also discussed any further diagnostic evaluation thatmay be needed or ordered today. [place] verbalizes understanding of the findings of todays visit. We also reviewed her medications today and discussed drug interactions and side effects including but not limited excessive drowsiness and altered mental states. We also discussed that there is always  a risk not just to her but also people around her. she has been encouraged to call the office with any questions or concerns that should arise related to todays visit.  No orders of the defined types were placed in this encounter.       I have personally obtained a history, examined the patient, evaluated laboratory and imaging results, formulated the assessment and plan and placed orders. This patient was seen today by Tressie Ellis, PA-C in collaboration with Dr. Devona Konig.   Allyne Gee, MD [facility] Diplomate ABMS Pulmonary Critical Care Medicine and Sleep Medicine

## 2021-11-09 ENCOUNTER — Encounter: Payer: Self-pay | Admitting: Family Medicine

## 2021-11-09 ENCOUNTER — Ambulatory Visit (INDEPENDENT_AMBULATORY_CARE_PROVIDER_SITE_OTHER): Payer: Medicare Other | Admitting: Family Medicine

## 2021-11-09 VITALS — BP 105/66 | HR 70 | Temp 98.2°F | Wt 234.0 lb

## 2021-11-09 DIAGNOSIS — J189 Pneumonia, unspecified organism: Secondary | ICD-10-CM | POA: Diagnosis not present

## 2021-11-09 MED ORDER — TRIAMCINOLONE ACETONIDE 40 MG/ML IJ SUSP
40.0000 mg | Freq: Once | INTRAMUSCULAR | Status: AC
  Administered 2021-11-09: 40 mg via INTRAMUSCULAR

## 2021-11-09 MED ORDER — BENZONATATE 200 MG PO CAPS: 200.0000 mg | ORAL_CAPSULE | Freq: Three times a day (TID) | ORAL | 1 refills | Status: DC | PRN

## 2021-11-09 MED ORDER — HYDROCOD POLI-CHLORPHE POLI ER 10-8 MG/5ML PO SUER: 5.0000 mL | Freq: Two times a day (BID) | ORAL | 0 refills | Status: DC

## 2021-11-09 MED ORDER — PREDNISONE 10 MG PO TABS: ORAL_TABLET | ORAL | 0 refills | Status: DC

## 2021-11-09 MED ORDER — AMOXICILLIN-POT CLAVULANATE 875-125 MG PO TABS: 1.0000 | ORAL_TABLET | Freq: Two times a day (BID) | ORAL | 0 refills | Status: DC

## 2021-11-09 NOTE — Progress Notes (Signed)
 BP 105/66   Pulse 70   Temp 98.2 F (36.8 C)   Wt 234 lb (106.1 kg)   SpO2 96%   BMI 35.58 kg/m    Subjective:    Patient ID: Kayla Wright, female    DOB: 11/02/1955, 66 y.o.   MRN: [id number]  HPI: Kayla Wright is a 66 y.o. female  Chief Complaint  Patient presents with   Cough    Patient states she has been coughing for 3 weeks, has been using OTC cough syrup, inhaler and Claritin. Patient states she is coughing through the night and not able to sleep.    COUGH Duration: 3 weeks Circumstances of initial development of cough: URI Cough severity: moderate Cough description: non-productive and dry Aggravating factors:  worse at night Alleviating factors: cold/sinus, mucinex, and cough syrup Status:  stable Treatments attempted: cold/sinus, mucinex, cough syrup, and antibiotics Wheezing: no Shortness of breath: no Chest pain: no Chest tightness:no Nasal congestion: no Runny nose: no Postnasal drip: no Frequent throat clearing or swallowing: no Hemoptysis: no Fevers: no Night sweats: no Weight loss: no Heartburn: no Recent foreign travel: no Tuberculosis contacts: no   Relevant past medical, surgical, family and social history reviewed and updated as indicated. Interim medical history since our last visit reviewed. Allergies and medications reviewed and updated.  Review of Systems  Constitutional:  Positive for fatigue. Negative for activity change, appetite change, chills, diaphoresis, fever and unexpected weight change.  HENT:  Positive for congestion. Negative for dental problem, drooling, ear discharge, ear pain, facial swelling, hearing loss, mouth sores, nosebleeds, postnasal drip, rhinorrhea, sinus pressure, sinus pain, sneezing, sore throat, tinnitus, trouble swallowing and voice change.   Eyes: Negative.   Respiratory:  Positive for cough, chest tightness and shortness of breath. Negative for apnea, choking, wheezing and stridor.   Cardiovascular:  Negative.   Gastrointestinal: Negative.   Genitourinary: Negative.   Musculoskeletal: Negative.   Neurological: Negative.   Psychiatric/Behavioral: Negative.      Per HPI unless specifically indicated above     Objective:    BP 105/66   Pulse 70   Temp 98.2 F (36.8 C)   Wt 234 lb (106.1 kg)   SpO2 96%   BMI 35.58 kg/m   Wt Readings from Last 3 Encounters:  11/09/21 234 lb (106.1 kg)  11/08/21 234 lb 9.6 oz (106.4 kg)  11/01/21 235 lb 1.6 oz (106.6 kg)    Physical Exam Vitals and nursing note reviewed.  Constitutional:      General: She is not in acute distress.    Appearance: Normal appearance. She is not ill-appearing, toxic-appearing or diaphoretic.  HENT:     Head: Normocephalic and atraumatic.     Right Ear: External ear normal.     Left Ear: External ear normal.     Nose: Nose normal. No congestion or rhinorrhea.     Mouth/Throat:     Mouth: Mucous membranes are moist.     Pharynx: Oropharynx is clear.  Eyes:     General: No scleral icterus.       Right eye: No discharge.        Left eye: No discharge.     Extraocular Movements: Extraocular movements intact.     Conjunctiva/sclera: Conjunctivae normal.     Pupils: Pupils are equal, round, and reactive to light.  Cardiovascular:     Rate and Rhythm: Normal rate and regular rhythm.     Pulses: Normal pulses.     Heart   sounds: Normal heart sounds. No murmur heard.    No friction rub. No gallop.  Pulmonary:     Effort: Pulmonary effort is normal. No respiratory distress.     Breath sounds: No stridor. Rhonchi (LLL) present. No wheezing or rales.  Chest:     Chest wall: No tenderness.  Musculoskeletal:        General: Normal range of motion.     Cervical back: Normal range of motion and neck supple.  Skin:    General: Skin is warm and dry.     Capillary Refill: Capillary refill takes less than 2 seconds.     Coloration: Skin is not jaundiced or pale.     Findings: No bruising, erythema, lesion or rash.   Neurological:     General: No focal deficit present.     Mental Status: She is alert and oriented to person, place, and time. Mental status is at baseline.  Psychiatric:        Mood and Affect: Mood normal.        Behavior: Behavior normal.        Thought Content: Thought content normal.        Judgment: Judgment normal.     Results for orders placed or performed in visit on 10/19/21  Comp Met (CMET)  Result Value Ref Range   Glucose 117 (H) 70 - 99 mg/dL   BUN 11 8 - 27 mg/dL   Creatinine, Ser 1.05 (H) 0.57 - 1.00 mg/dL   eGFR 59 (L) >59 mL/min/1.73   BUN/Creatinine Ratio 10 (L) 12 - 28   Sodium 139 134 - 144 mmol/L   Potassium 4.0 3.5 - 5.2 mmol/L   Chloride 102 96 - 106 mmol/L   CO2 22 20 - 29 mmol/L   Calcium 9.3 8.7 - 10.3 mg/dL   Total Protein 6.5 6.0 - 8.5 g/dL   Albumin 4.3 3.9 - 4.9 g/dL   Globulin, Total 2.2 1.5 - 4.5 g/dL   Albumin/Globulin Ratio 2.0 1.2 - 2.2   Bilirubin Total 0.8 0.0 - 1.2 mg/dL   Alkaline Phosphatase 82 44 - 121 IU/L   AST 20 0 - 40 IU/L   ALT 24 0 - 32 IU/L  TSH  Result Value Ref Range   TSH 2.260 0.450 - 4.500 uIU/mL  T4, free  Result Value Ref Range   Free T4 0.99 0.82 - 1.77 ng/dL  Lipid Profile  Result Value Ref Range   Cholesterol, Total 162 100 - 199 mg/dL   Triglycerides 181 (H) 0 - 149 mg/dL   HDL 39 (L) >39 mg/dL   VLDL Cholesterol Cal 31 5 - 40 mg/dL   LDL Chol Calc (NIH) 92 0 - 99 mg/dL   Chol/HDL Ratio 4.2 0.0 - 4.4 ratio  HgB A1c  Result Value Ref Range   Hgb A1c MFr Bld 6.5 (H) 4.8 - 5.6 %   Est. average glucose Bld gHb Est-mCnc 140 mg/dL      Assessment & Plan:   Problem List Items Addressed This Visit   None Visit Diagnoses     Pneumonia of left lower lobe due to infectious organism    -  Primary   Will treat with predinsone, augmentin, tessalon and tussionex. Call if not getting better or getting worse. Recheck lungs to confirm resolution in 2 weeks.    Relevant Medications   triamcinolone acetonide  (KENALOG-40) injection 40 mg   amoxicillin-clavulanate (AUGMENTIN) 875-125 MG tablet   chlorpheniramine-HYDROcodone (TUSSIONEX) 10-8 MG/5ML     benzonatate (TESSALON) 200 MG capsule        Follow up plan: Return in about 2 weeks (around 11/23/2021), or with me or Santiago Glad.

## 2021-11-11 DIAGNOSIS — M25572 Pain in left ankle and joints of left foot: Secondary | ICD-10-CM | POA: Diagnosis not present

## 2021-11-11 DIAGNOSIS — Z79899 Other long term (current) drug therapy: Secondary | ICD-10-CM | POA: Diagnosis not present

## 2021-11-11 DIAGNOSIS — J449 Chronic obstructive pulmonary disease, unspecified: Secondary | ICD-10-CM | POA: Diagnosis not present

## 2021-11-11 DIAGNOSIS — Z9181 History of falling: Secondary | ICD-10-CM | POA: Diagnosis not present

## 2021-11-11 DIAGNOSIS — N183 Chronic kidney disease, stage 3 unspecified: Secondary | ICD-10-CM | POA: Diagnosis not present

## 2021-11-11 DIAGNOSIS — N1831 Chronic kidney disease, stage 3a: Secondary | ICD-10-CM | POA: Diagnosis not present

## 2021-11-11 DIAGNOSIS — R030 Elevated blood-pressure reading, without diagnosis of hypertension: Secondary | ICD-10-CM | POA: Diagnosis not present

## 2021-11-15 ENCOUNTER — Other Ambulatory Visit: Payer: Self-pay | Admitting: Nurse Practitioner

## 2021-11-16 NOTE — Telephone Encounter (Signed)
Patient was given 1 year supply- #100 sent per request for insurance

## 2021-11-25 ENCOUNTER — Ambulatory Visit: Payer: Medicare Other | Admitting: Physician Assistant

## 2021-11-26 ENCOUNTER — Ambulatory Visit (INDEPENDENT_AMBULATORY_CARE_PROVIDER_SITE_OTHER): Payer: Medicare Other | Admitting: Physician Assistant

## 2021-11-26 ENCOUNTER — Encounter: Payer: Self-pay | Admitting: Physician Assistant

## 2021-11-26 VITALS — BP 110/70 | HR 59 | Temp 98.9°F | Wt 234.4 lb

## 2021-11-26 DIAGNOSIS — B372 Candidiasis of skin and nail: Secondary | ICD-10-CM | POA: Diagnosis not present

## 2021-11-26 DIAGNOSIS — G479 Sleep disorder, unspecified: Secondary | ICD-10-CM

## 2021-11-26 DIAGNOSIS — J189 Pneumonia, unspecified organism: Secondary | ICD-10-CM

## 2021-11-26 MED ORDER — NYSTATIN 100000 UNIT/GM EX CREA: 1.0000 | TOPICAL_CREAM | Freq: Two times a day (BID) | CUTANEOUS | 1 refills | Status: AC

## 2021-11-26 NOTE — Progress Notes (Signed)
Acute Office Visit   Patient: Kayla Wright   DOB: 10-04-1955   66 y.o. Female  MRN: [id number] Visit Date: 11/26/2021  Today's healthcare provider: Dani Gobble Jenilyn Magana, PA-C  Introduced myself to the patient as a Journalist, newspaper and provided education on APPs in clinical practice.    Chief Complaint  Patient presents with   Pneumonia    Patient is here for a two week follow up on Pneumonia. Patient says she is doing better.    Rash    Patient says she would like something for a heat rash under her breast.    Insomnia   Subjective    HPI HPI     Pneumonia    Additional comments: Patient is here for a two week follow up on Pneumonia. Patient says she is doing better.         Rash    Additional comments: Patient says she would like something for a heat rash under her breast.       Last edited by Irena Reichmann, CMA on 11/26/2021  1:58 PM.         Pneumonia Follow up  Was treated with Augmentin, Tussionex and Tessalon pearls as well as kenalog injection  Reports she is feeling better and breathing is improved She has finished all of her medications She denies needing to use her rescue inhaler much at all   Rash She reports she has developed a rash under her right breast  She has been using a powder on the area - unsure what it is right now  States she was using a cream for it but ran out of this Reports relief and improvement with Nystatin cream   Difficulty sleeping  She reports she broke up with her boyfriend last week and is having trouble sleeping States she has tried melatonin but this is not helping She is taking Atarax and Seroquel every night - she reports she falls asleep for about five minutes then wakes up Denies other changes other than recent breakup     Medications: Outpatient Medications Prior to Visit  Medication Sig   albuterol (VENTOLIN HFA) 108 (90 Base) MCG/ACT inhaler USE 1 INHALATION BY MOUTH  EVERY 6 HOURS AS NEEDED   Ascorbic Acid (VITAMIN  C) 1000 MG tablet Take 1 tablet (1,000 mg total) by mouth daily.   Cholecalciferol (VITAMIN D3) 125 MCG (5000 UT) CAPS Take 1 capsule (5,000 Units total) by mouth daily.   cloNIDine (CATAPRES) 0.2 MG tablet TAKE 1 TABLET BY MOUTH 3 TIMES  DAILY   Cyanocobalamin (VITAMIN B-12 PO) Take 1 tablet by mouth daily.   FLUoxetine (PROZAC) 20 MG capsule TAKE 1 CAPSULE BY MOUTH DAILY  WITH A 40 MG CAPSULE FOR TOTAL  60 MG DAILY   FLUoxetine (PROZAC) 40 MG capsule TAKE 1 CAPSULE BY MOUTH DAILY   fluticasone-salmeterol (ADVAIR HFA) 45-21 MCG/ACT inhaler Inhale 2 puffs into the lungs 2 (two) times daily.   hydrOXYzine (ATARAX) 25 MG tablet TAKE 1 TABLET BY MOUTH AT  BEDTIME AS NEEDED   levothyroxine (SYNTHROID) 50 MCG tablet TAKE 1 TABLET BY MOUTH DAILY  BEFORE BREAKFAST   oxyCODONE (OXY IR/ROXICODONE) 5 MG immediate release tablet Take 5 mg by mouth 4 (four) times daily as needed.   oxyCODONE-acetaminophen (PERCOCET/ROXICET) 5-325 MG tablet Take 1 tablet by mouth 4 (four) times daily as needed.   QUEtiapine Fumarate (SEROQUEL XR) 150 MG 24 hr tablet TAKE 1 TABLET BY MOUTH AT  BEDTIME   simvastatin (ZOCOR) 40 MG tablet TAKE 1 TABLET BY MOUTH DAILY   [DISCONTINUED] amoxicillin-clavulanate (AUGMENTIN) 875-125 MG tablet Take 1 tablet by mouth 2 (two) times daily. (Patient not taking: Reported on 11/26/2021)   [DISCONTINUED] benzonatate (TESSALON) 200 MG capsule Take 1 capsule (200 mg total) by mouth 3 (three) times daily as needed for cough. (Patient not taking: Reported on 11/26/2021)   [DISCONTINUED] chlorpheniramine-HYDROcodone (TUSSIONEX) 10-8 MG/5ML Take 5 mLs by mouth 2 (two) times daily. (Patient not taking: Reported on 11/26/2021)   [DISCONTINUED] nystatin cream (MYCOSTATIN) Apply 1 application. topically 2 (two) times daily. (Patient not taking: Reported on 11/26/2021)   [DISCONTINUED] predniSONE (DELTASONE) 10 MG tablet Take 6 pills in the AM days 1 and 2, take 5 pills days 3 and 4. Decrease by 1 every other  day until gone (Patient not taking: Reported on 11/26/2021)   No facility-administered medications prior to visit.    Review of Systems  Constitutional:  Negative for chills and fever.  Respiratory:  Negative for cough, chest tightness, shortness of breath and wheezing.   Cardiovascular:  Negative for chest pain.       Objective    BP 110/70   Pulse (!) 59   Temp 98.9 F (37.2 C) (Oral)   Wt 234 lb 6.4 oz (106.3 kg)   SpO2 97%   BMI 35.64 kg/m    Physical Exam Vitals reviewed.  Constitutional:      General: She is awake.     Appearance: Normal appearance. She is well-developed and well-groomed.  HENT:     Head: Normocephalic and atraumatic.  Cardiovascular:     Rate and Rhythm: Normal rate and regular rhythm.     Heart sounds: Normal heart sounds. No murmur heard.    No friction rub. No gallop.  Pulmonary:     Effort: Pulmonary effort is normal.     Breath sounds: No decreased air movement. No decreased breath sounds, wheezing, rhonchi or rales.  Neurological:     General: No focal deficit present.     Mental Status: She is alert and oriented to person, place, and time.     GCS: GCS eye subscore is 4. GCS verbal subscore is 5. GCS motor subscore is 6.  Psychiatric:        Attention and Perception: Attention and perception normal.        Mood and Affect: Mood and affect normal.        Speech: Speech normal.        Behavior: Behavior is withdrawn. Behavior is cooperative.       No results found for any visits on 11/26/21.  Assessment & Plan      No follow-ups on file.      Problem List Items Addressed This Visit       Musculoskeletal and Integument   Skin yeast infection    Acute, recurrent Reports rash under right breast consistent with candida infection  Will provide refill of Nystatin cream to assist with resolution Recommend gentle washing and making sure area is dry and clean throughout the day Follow up as needed for persistent symptoms        Relevant Medications   nystatin cream (MYCOSTATIN)     Other   Disturbance of sleep    Acute, ongoing Reports she recently broke up with boyfriend and now is having trouble sleeping Appears to be ongoing for the past few nights and not improving with Melatonin She is taking Hydroxyzine 25 mg PO  QHS and Seroquel 150 mg PO QHS  Discussed transient sleep disturbance vs insomnia and need to use sleep hygiene before adding another medication or sedating agent Follow up as needed for persistent or progressing symptoms       Other Visit Diagnoses     Pneumonia of left lower lobe due to infectious organism    -  Primary Acute, resolved She was treated with Augmentin, Tussionex and Tessalon pearls as well as kenalog injection  Reports she has finished all medications and is feeling better PE was reassuring for resolution today Follow up as needed for recurrent symptoms                No follow-ups on file.   I, Omya Winfield E Quetzaly Ebner, PA-C, have reviewed all documentation for this visit. The documentation on 11/26/21 for the exam, diagnosis, procedures, and orders are all accurate and complete.   Jacquelin Hawking, MHS, PA-C [facility]

## 2021-11-26 NOTE — Assessment & Plan Note (Signed)
Acute, recurrent Reports rash under right breast consistent with candida infection  Will provide refill of Nystatin cream to assist with resolution Recommend gentle washing and making sure area is dry and clean throughout the day Follow up as needed for persistent symptoms

## 2021-11-26 NOTE — Assessment & Plan Note (Signed)
Acute, ongoing Reports she recently broke up with boyfriend and now is having trouble sleeping Appears to be ongoing for the past few nights and not improving with Melatonin She is taking Hydroxyzine 25 mg PO QHS and Seroquel 150 mg PO QHS  Discussed transient sleep disturbance vs insomnia and need to use sleep hygiene before adding another medication or sedating agent Follow up as needed for persistent or progressing symptoms

## 2021-11-26 NOTE — Patient Instructions (Addendum)
Sleep hygiene (Practices to help improve sleep)  *Try to go to bed at the same time every night *Make your room as dark and quiet as possible for sleep (ambient noises or sleep machine is okay too) *Try to establish a bedtime routine before bed (shower, reading for a little bit, etc.) *No TVs or phone for at least 30min to an hour before bed. (The blue light from these devices can affect your sleep-wake cycle and make it harder to go to sleep) *Avoid caffeine in the afternoon and especially before bedtime.  The goal is to establish a relaxing environment and routine that signals your body that it is time to go to sleep.    

## 2021-12-10 DIAGNOSIS — G4733 Obstructive sleep apnea (adult) (pediatric): Secondary | ICD-10-CM | POA: Diagnosis not present

## 2021-12-14 ENCOUNTER — Ambulatory Visit
Admission: RE | Admit: 2021-12-14 | Discharge: 2021-12-14 | Disposition: A | Discharge status: Home / Self Care | Payer: Medicare Other | Source: Ambulatory Visit | Attending: Nurse Practitioner | Admitting: Nurse Practitioner

## 2021-12-14 DIAGNOSIS — Z780 Asymptomatic menopausal state: Secondary | ICD-10-CM

## 2021-12-14 DIAGNOSIS — M25572 Pain in left ankle and joints of left foot: Secondary | ICD-10-CM | POA: Diagnosis not present

## 2021-12-14 DIAGNOSIS — N183 Chronic kidney disease, stage 3 unspecified: Secondary | ICD-10-CM | POA: Diagnosis not present

## 2021-12-14 DIAGNOSIS — E559 Vitamin D deficiency, unspecified: Secondary | ICD-10-CM | POA: Diagnosis not present

## 2021-12-14 DIAGNOSIS — Z79899 Other long term (current) drug therapy: Secondary | ICD-10-CM | POA: Diagnosis not present

## 2021-12-14 DIAGNOSIS — R030 Elevated blood-pressure reading, without diagnosis of hypertension: Secondary | ICD-10-CM | POA: Diagnosis not present

## 2021-12-14 DIAGNOSIS — M8589 Other specified disorders of bone density and structure, multiple sites: Secondary | ICD-10-CM | POA: Diagnosis not present

## 2021-12-14 DIAGNOSIS — Z1231 Encounter for screening mammogram for malignant neoplasm of breast: Secondary | ICD-10-CM | POA: Diagnosis not present

## 2021-12-14 DIAGNOSIS — Z9181 History of falling: Secondary | ICD-10-CM | POA: Diagnosis not present

## 2021-12-14 DIAGNOSIS — J449 Chronic obstructive pulmonary disease, unspecified: Secondary | ICD-10-CM | POA: Diagnosis not present

## 2021-12-15 NOTE — Progress Notes (Signed)
Please let patient know that her bone density scan shows that she has osteopenia.  Her bones are not as dense as we would like.  I recommend she take calcium with vitamin D over the counter to help with this.

## 2021-12-20 NOTE — Progress Notes (Signed)
Please let patient know her Mammogram did not show any evidence of a malignancy.  The recommendation is to repeat the Mammogram in 1 year.  

## 2022-01-07 ENCOUNTER — Other Ambulatory Visit: Payer: Self-pay | Admitting: Nurse Practitioner

## 2022-01-07 NOTE — Telephone Encounter (Signed)
Requested Prescriptions  Pending Prescriptions Disp Refills   hydrOXYzine (ATARAX) 25 MG tablet [Pharmacy Med Name: hydrOXYzine HCl 25 MG Oral Tablet] 80 tablet 0    Sig: TAKE 1 TABLET BY MOUTH AT  BEDTIME AS NEEDED     Ear, Nose, and Throat:  Antihistamines 2 Failed - 01/07/2022  3:42 PM      Failed - Cr in normal range and within 360 days    Creatinine, Ser  Date Value Ref Range Status  10/19/2021 1.05 (H) 0.57 - 1.00 mg/dL Final         Passed - Valid encounter within last 12 months    Recent Outpatient Visits           1 month ago Pneumonia of left lower lobe due to infectious organism   [employer], Erin E, PA-C   1 month ago Pneumonia of left lower lobe due to infectious organism   [employer], Megan P, DO   2 months ago Moderate persistent asthma with acute exacerbation   [facility] Mecum, Erin E, PA-C   2 months ago Stage 3 chronic kidney disease, unspecified whether stage 3a or 3b CKD (HCC)   [facility] Larae Grooms, NP   5 months ago Bone spur of left foot   [facility] [place], Oralia Rud, DO       Future Appointments             In 3 months Larae Grooms, NP [facility], [facility]

## 2022-01-13 DIAGNOSIS — Z9181 History of falling: Secondary | ICD-10-CM | POA: Diagnosis not present

## 2022-01-13 DIAGNOSIS — M25572 Pain in left ankle and joints of left foot: Secondary | ICD-10-CM | POA: Diagnosis not present

## 2022-01-13 DIAGNOSIS — E559 Vitamin D deficiency, unspecified: Secondary | ICD-10-CM | POA: Diagnosis not present

## 2022-01-13 DIAGNOSIS — N183 Chronic kidney disease, stage 3 unspecified: Secondary | ICD-10-CM | POA: Diagnosis not present

## 2022-01-13 DIAGNOSIS — R030 Elevated blood-pressure reading, without diagnosis of hypertension: Secondary | ICD-10-CM | POA: Diagnosis not present

## 2022-01-13 DIAGNOSIS — J449 Chronic obstructive pulmonary disease, unspecified: Secondary | ICD-10-CM | POA: Diagnosis not present

## 2022-01-13 DIAGNOSIS — Z79899 Other long term (current) drug therapy: Secondary | ICD-10-CM | POA: Diagnosis not present

## 2022-01-19 DIAGNOSIS — Z79899 Other long term (current) drug therapy: Secondary | ICD-10-CM | POA: Diagnosis not present

## 2022-02-16 ENCOUNTER — Other Ambulatory Visit: Payer: Self-pay | Admitting: Nurse Practitioner

## 2022-02-16 DIAGNOSIS — J449 Chronic obstructive pulmonary disease, unspecified: Secondary | ICD-10-CM | POA: Diagnosis not present

## 2022-02-16 DIAGNOSIS — E559 Vitamin D deficiency, unspecified: Secondary | ICD-10-CM | POA: Diagnosis not present

## 2022-02-16 DIAGNOSIS — M25572 Pain in left ankle and joints of left foot: Secondary | ICD-10-CM | POA: Diagnosis not present

## 2022-02-16 DIAGNOSIS — N1831 Chronic kidney disease, stage 3a: Secondary | ICD-10-CM | POA: Diagnosis not present

## 2022-02-16 DIAGNOSIS — N183 Chronic kidney disease, stage 3 unspecified: Secondary | ICD-10-CM | POA: Diagnosis not present

## 2022-02-16 DIAGNOSIS — G894 Chronic pain syndrome: Secondary | ICD-10-CM | POA: Diagnosis not present

## 2022-02-16 DIAGNOSIS — Z79891 Long term (current) use of opiate analgesic: Secondary | ICD-10-CM | POA: Diagnosis not present

## 2022-02-16 DIAGNOSIS — R030 Elevated blood-pressure reading, without diagnosis of hypertension: Secondary | ICD-10-CM | POA: Diagnosis not present

## 2022-02-16 DIAGNOSIS — Z79899 Other long term (current) drug therapy: Secondary | ICD-10-CM | POA: Diagnosis not present

## 2022-02-16 DIAGNOSIS — Z9181 History of falling: Secondary | ICD-10-CM | POA: Diagnosis not present

## 2022-02-16 NOTE — Telephone Encounter (Signed)
Requested Prescriptions  Pending Prescriptions Disp Refills   simvastatin (ZOCOR) 40 MG tablet [Pharmacy Med Name: Simvastatin 40 MG Oral Tablet] 100 tablet 2    Sig: TAKE 1 TABLET BY MOUTH DAILY     Cardiovascular:  Antilipid - Statins Failed - 02/16/2022  6:41 AM      Failed - Lipid Panel in normal range within the last 12 months    Cholesterol, Total  Date Value Ref Range Status  10/19/2021 162 100 - 199 mg/dL Final   LDL Chol Calc (NIH)  Date Value Ref Range Status  10/19/2021 92 0 - 99 mg/dL Final   HDL  Date Value Ref Range Status  10/19/2021 39 (L) >39 mg/dL Final   Triglycerides  Date Value Ref Range Status  10/19/2021 181 (H) 0 - 149 mg/dL Final         Passed - Patient is not pregnant      Passed - Valid encounter within last 12 months    Recent Outpatient Visits           2 months ago Pneumonia of left lower lobe due to infectious organism   [place] [facility] Mecum, Erin E, PA-C   3 months ago Pneumonia of left lower lobe due to infectious organism   [place], Megan P, DO   3 months ago Moderate persistent asthma with acute exacerbation   [place] [facility] Mecum, Erin E, PA-C   4 months ago Stage 3 chronic kidney disease, unspecified whether stage 3a or 3b CKD ([place])   [place] Jon Billings, NP   7 months ago Bone spur of left foot   [place], Barb Merino, DO       Future Appointments             In 2 months Jon Billings, NP [place], [facility]             levothyroxine (SYNTHROID) 50 MCG tablet [Pharmacy Med Name: Levothyroxine Sodium 50 MCG Oral Tablet] 100 tablet 2    Sig: TAKE 1 TABLET BY MOUTH DAILY  BEFORE BREAKFAST     Endocrinology:  Hypothyroid Agents Passed - 02/16/2022  6:41 AM      Passed - TSH in normal range and within 360 days    TSH  Date Value Ref Range Status   10/19/2021 2.260 0.450 - 4.500 uIU/mL Final         Passed - Valid encounter within last 12 months    Recent Outpatient Visits           2 months ago Pneumonia of left lower lobe due to infectious organism   [place], Erin E, PA-C   3 months ago Pneumonia of left lower lobe due to infectious organism   [place], Megan P, DO   3 months ago Moderate persistent asthma with acute exacerbation   [place] [facility] Mecum, Erin E, PA-C   4 months ago Stage 3 chronic kidney disease, unspecified whether stage 3a or 3b CKD ([place])   [place], Karen, NP   7 months ago Bone spur of left foot   [place], [place], DO       Future Appointments             In 2 months Jon Billings,  NP [place], [facility]

## 2022-02-19 ENCOUNTER — Other Ambulatory Visit: Payer: Self-pay | Admitting: Nurse Practitioner

## 2022-02-21 NOTE — Telephone Encounter (Signed)
Requested medication (s) are due for refill today - no  Requested medication (s) are on the active medication list -yes  Future visit scheduled -yes  Last refill: 05/24/21 #30 11RF  Notes to clinic: non delegated Rx  Requested Prescriptions  Pending Prescriptions Disp Refills   QUEtiapine Fumarate (SEROQUEL XR) 150 MG 24 hr tablet [Pharmacy Med Name: QUETIAPINE  150MG   TAB  XR] 30 tablet 11    Sig: TAKE 1 TABLET BY MOUTH AT  BEDTIME     Not Delegated - Psychiatry:  Antipsychotics - Second Generation (Atypical) - quetiapine Failed - 02/19/2022 10:37 PM      Failed - This refill cannot be delegated      Failed - Lipid Panel in normal range within the last 12 months    Cholesterol, Total  Date Value Ref Range Status  10/19/2021 162 100 - 199 mg/dL Final   LDL Chol Calc (NIH)  Date Value Ref Range Status  10/19/2021 92 0 - 99 mg/dL Final   HDL  Date Value Ref Range Status  10/19/2021 39 (L) >39 mg/dL Final   Triglycerides  Date Value Ref Range Status  10/19/2021 181 (H) 0 - 149 mg/dL Final         Failed - CBC within normal limits and completed in the last 12 months    WBC  Date Value Ref Range Status  03/22/2021 6.5  Final  09/18/2017 8.8 3.6 - 11.0 K/uL Final   RBC  Date Value Ref Range Status  03/22/2021 4.1 3.87 - 5.11 Final   Hemoglobin  Date Value Ref Range Status  03/22/2021 11.4 (A) 12.0 - 16.0 Final  02/26/2018 13.6 11.1 - 15.9 g/dL Final   HCT  Date Value Ref Range Status  03/22/2021 35 (A) 36 - 46 Final   Hematocrit  Date Value Ref Range Status  02/26/2018 40.8 34.0 - 46.6 % Final   MCHC  Date Value Ref Range Status  02/26/2018 33.3 31.5 - 35.7 g/dL Final  04/27/2018 32/44/0102 32.0 - 36.0 g/dL Final   [facility]  Date Value Ref Range Status  02/26/2018 30.6 26.6 - 33.0 pg Final  09/18/2017 31.6 26.0 - 34.0 pg Final   MCV  Date Value Ref Range Status  02/26/2018 92 79 - 97 fL Final   No results found for: "PLTCOUNTKUC", "LABPLAT", "POCPLA" RDW  Date  Value Ref Range Status  02/26/2018 14.3 11.7 - 15.4 % Final         Passed - TSH in normal range and within 360 days    TSH  Date Value Ref Range Status  10/19/2021 2.260 0.450 - 4.500 uIU/mL Final         Passed - Completed PHQ-2 or PHQ-9 in the last 360 days      Passed - Last BP in normal range    BP Readings from Last 1 Encounters:  11/26/21 110/70         Passed - Last Heart Rate in normal range    Pulse Readings from Last 1 Encounters:  11/26/21 (!) 59         Passed - Valid encounter within last 6 months    Recent Outpatient Visits           2 months ago Pneumonia of left lower lobe due to infectious organism   [place] [facility] Mecum, Erin E, PA-C   3 months ago Pneumonia of left lower lobe due to infectious organism   [place] [facility] [place], Megan P, DO   3 months ago Moderate persistent asthma with acute exacerbation   [place] [facility] Mecum, Erin E, PA-C   4 months ago Stage 3 chronic kidney disease, unspecified whether stage 3a or 3b CKD ([place])   [place] Jon Billings, NP   7 months ago Bone spur of left foot   [place], [place], DO       Future Appointments             In 1 month Jon Billings, NP [place], [facility]            Passed - CMP within normal limits and completed in the last 12 months    Albumin  Date Value Ref Range Status  10/19/2021 4.3 3.9 - 4.9 g/dL Final   Alkaline Phosphatase  Date Value Ref Range Status  10/19/2021 82 44 - 121 IU/L Final   ALT  Date Value Ref Range Status  10/19/2021 24 0 - 32 IU/L Final   AST  Date Value Ref Range Status  10/19/2021 20 0 - 40 IU/L Final   BUN  Date Value Ref Range Status  10/19/2021 11 8 - 27 mg/dL Final   Calcium  Date Value Ref Range Status  10/19/2021 9.3 8.7 - 10.3 mg/dL Final   CO2  Date Value Ref Range Status   10/19/2021 22 20 - 29 mmol/L Final   Creatinine, Ser  Date Value Ref Range Status  10/19/2021 1.05 (H) 0.57 - 1.00 mg/dL Final   Glucose  Date Value Ref Range Status  10/19/2021 117 (H) 70 - 99 mg/dL Final  03/22/2021 112  Final   Glucose, Bld  Date Value Ref Range Status  09/18/2017 99 70 - 99 mg/dL Final   Potassium  Date Value Ref Range Status  10/19/2021 4.0 3.5 - 5.2 mmol/L Final   Sodium  Date Value Ref Range Status  10/19/2021 139 134 - 144 mmol/L Final   Bilirubin Total  Date Value Ref Range Status  10/19/2021 0.8 0.0 - 1.2 mg/dL Final   Bilirubin, Direct  Date Value Ref Range Status  09/18/2017 0.1 0.0 - 0.2 mg/dL Final   Indirect Bilirubin  Date Value Ref Range Status  09/18/2017 0.7 0.3 - 0.9 mg/dL Final    Comment:    Performed at [facility], [address] [place], [place] [address]   [place]  Date Value Ref Range Status  05/15/2020 Negative Negative/Trace Final   Total Protein  Date Value Ref Range Status  10/19/2021 6.5 6.0 - 8.5 g/dL Final   GFR calc Af Amer  Date Value Ref Range Status  07/15/2019 59 (L) >59 mL/min/1.73 Final    Comment:    **[facility] currently reports eGFR in compliance with the current**   recommendations of the [employer]. [facility] will   update reporting as new guidelines are published from the NKF-ASN   Task force.    eGFR  Date Value Ref Range Status  10/19/2021 59 (L) >59 mL/min/1.73 Final   GFR calc non Af Amer  Date Value Ref Range Status  07/15/2019 51 (L) >59 mL/min/1.73 Final            Requested Prescriptions  Pending Prescriptions Disp Refills   QUEtiapine Fumarate (SEROQUEL XR) 150 MG 24 hr tablet [Pharmacy Med Name: QUETIAPINE  150MG   TAB  XR] 30 tablet 11    Sig: TAKE 1 TABLET  BY MOUTH AT  BEDTIME     Not Delegated - Psychiatry:  Antipsychotics - Second Generation (Atypical) - quetiapine Failed - 02/19/2022 10:37 PM      Failed - This refill cannot be delegated       Failed - Lipid Panel in normal range within the last 12 months    Cholesterol, Total  Date Value Ref Range Status  10/19/2021 162 100 - 199 mg/dL Final   LDL Chol Calc (NIH)  Date Value Ref Range Status  10/19/2021 92 0 - 99 mg/dL Final   HDL  Date Value Ref Range Status  10/19/2021 39 (L) >39 mg/dL Final   Triglycerides  Date Value Ref Range Status  10/19/2021 181 (H) 0 - 149 mg/dL Final         Failed - CBC within normal limits and completed in the last 12 months    WBC  Date Value Ref Range Status  03/22/2021 6.5  Final  09/18/2017 8.8 3.6 - 11.0 K/uL Final   RBC  Date Value Ref Range Status  03/22/2021 4.1 3.87 - 5.11 Final   Hemoglobin  Date Value Ref Range Status  03/22/2021 11.4 (A) 12.0 - 16.0 Final  02/26/2018 13.6 11.1 - 15.9 g/dL Final   HCT  Date Value Ref Range Status  03/22/2021 35 (A) 36 - 46 Final   Hematocrit  Date Value Ref Range Status  02/26/2018 40.8 34.0 - 46.6 % Final   MCHC  Date Value Ref Range Status  02/26/2018 33.3 31.5 - 35.7 g/dL Final  32/35/5732 20.2 32.0 - 36.0 g/dL Final   [facility]  Date Value Ref Range Status  02/26/2018 30.6 26.6 - 33.0 pg Final  09/18/2017 31.6 26.0 - 34.0 pg Final   MCV  Date Value Ref Range Status  02/26/2018 92 79 - 97 fL Final   No results found for: "PLTCOUNTKUC", "LABPLAT", "POCPLA" RDW  Date Value Ref Range Status  02/26/2018 14.3 11.7 - 15.4 % Final         Passed - TSH in normal range and within 360 days    TSH  Date Value Ref Range Status  10/19/2021 2.260 0.450 - 4.500 uIU/mL Final         Passed - Completed PHQ-2 or PHQ-9 in the last 360 days      Passed - Last BP in normal range    BP Readings from Last 1 Encounters:  11/26/21 110/70         Passed - Last Heart Rate in normal range    Pulse Readings from Last 1 Encounters:  11/26/21 (!) 59         Passed - Valid encounter within last 6 months    Recent Outpatient Visits           2 months ago Pneumonia of left  lower lobe due to infectious organism   [place] [facility] Mecum, Erin E, PA-C   3 months ago Pneumonia of left lower lobe due to infectious organism   [place] [facility] [place], Megan P, DO   3 months ago Moderate persistent asthma with acute exacerbation   [place] [facility] Mecum, Erin E, PA-C   4 months ago Stage 3 chronic kidney disease, unspecified whether stage 3a or 3b CKD (HCC)   [place] [facility] Larae Grooms, NP   7 months ago Bone spur of left foot   [facility] [place], [place], DO  Future Appointments             In 1 month Jon Billings, NP [place], [facility]            Passed - CMP within normal limits and completed in the last 12 months    Albumin  Date Value Ref Range Status  10/19/2021 4.3 3.9 - 4.9 g/dL Final   Alkaline Phosphatase  Date Value Ref Range Status  10/19/2021 82 44 - 121 IU/L Final   ALT  Date Value Ref Range Status  10/19/2021 24 0 - 32 IU/L Final   AST  Date Value Ref Range Status  10/19/2021 20 0 - 40 IU/L Final   BUN  Date Value Ref Range Status  10/19/2021 11 8 - 27 mg/dL Final   Calcium  Date Value Ref Range Status  10/19/2021 9.3 8.7 - 10.3 mg/dL Final   CO2  Date Value Ref Range Status  10/19/2021 22 20 - 29 mmol/L Final   Creatinine, Ser  Date Value Ref Range Status  10/19/2021 1.05 (H) 0.57 - 1.00 mg/dL Final   Glucose  Date Value Ref Range Status  10/19/2021 117 (H) 70 - 99 mg/dL Final  03/22/2021 112  Final   Glucose, Bld  Date Value Ref Range Status  09/18/2017 99 70 - 99 mg/dL Final   Potassium  Date Value Ref Range Status  10/19/2021 4.0 3.5 - 5.2 mmol/L Final   Sodium  Date Value Ref Range Status  10/19/2021 139 134 - 144 mmol/L Final   Bilirubin Total  Date Value Ref Range Status  10/19/2021 0.8 0.0 - 1.2 mg/dL Final   Bilirubin, Direct  Date Value Ref Range  Status  09/18/2017 0.1 0.0 - 0.2 mg/dL Final   Indirect Bilirubin  Date Value Ref Range Status  09/18/2017 0.7 0.3 - 0.9 mg/dL Final    Comment:    Performed at [facility], [address] [place], [place] [address]   [place]  Date Value Ref Range Status  05/15/2020 Negative Negative/Trace Final   Total Protein  Date Value Ref Range Status  10/19/2021 6.5 6.0 - 8.5 g/dL Final   GFR calc Af Amer  Date Value Ref Range Status  07/15/2019 59 (L) >59 mL/min/1.73 Final    Comment:    **[facility] currently reports eGFR in compliance with the current**   recommendations of the [employer]. [facility] will   update reporting as new guidelines are published from the NKF-ASN   Task force.    eGFR  Date Value Ref Range Status  10/19/2021 59 (L) >59 mL/min/1.73 Final   GFR calc non Af Amer  Date Value Ref Range Status  07/15/2019 51 (L) >59 mL/min/1.73 Final

## 2022-03-06 ENCOUNTER — Other Ambulatory Visit: Payer: Self-pay | Admitting: Nurse Practitioner

## 2022-03-07 ENCOUNTER — Ambulatory Visit (INDEPENDENT_AMBULATORY_CARE_PROVIDER_SITE_OTHER): Payer: Medicare Other

## 2022-03-07 VITALS — Wt 234.0 lb

## 2022-03-07 DIAGNOSIS — Z0000 Encounter for general adult medical examination without abnormal findings: Secondary | ICD-10-CM | POA: Diagnosis not present

## 2022-03-07 NOTE — Progress Notes (Signed)
Virtual Visit via Telephone Note  I connected with  Kayla Wright on 03/07/22 at  9:15 AM EST by telephone and verified that I am speaking with the correct person using two identifiers.  Location: Patient: home Provider: [facility] Persons participating in the virtual visit: patient/Nurse Health Advisor   I discussed the limitations, risks, security and privacy concerns of performing an evaluation and management service by telephone and the availability of in person appointments. The patient expressed understanding and agreed to proceed.  Interactive audio and video telecommunications were attempted between this nurse and patient, however failed, due to patient having technical difficulties OR patient did not have access to video capability.  We continued and completed visit with audio only.  Some vital signs may be absent or patient reported.   Dionisio David, LPN  Subjective:   Kayla Wright is a 67 y.o. female who presents for Medicare Annual (Subsequent) preventive examination.  Review of Systems     Cardiac Risk Factors include: advanced age (>74mn, >>47women);hypertension;obesity (BMI >30kg/m2);sedentary lifestyle     Objective:    There were no vitals filed for this visit. There is no height or weight on file to calculate BMI.     03/07/2022    9:16 AM 12/23/2020   10:35 AM 12/23/2019   10:46 AM 08/31/2017    8:55 AM 12/06/2016    1:21 PM 11/09/2016    2:07 PM 10/10/2016    8:18 AM  Advanced Directives  Does Patient Have a Medical Advance Directive? No No Yes Yes Yes Yes Yes  Type of AComptrollerLiving will HByersLiving will Living will Living will Living will  Copy of HHendersonin Chart?   No - copy requested      Would patient like information on creating a medical advance directive? No - Patient declined No - Patient declined         Current Medications (verified) Outpatient Encounter  Medications as of 03/07/2022  Medication Sig   albuterol (VENTOLIN HFA) 108 (90 Base) MCG/ACT inhaler USE 1 INHALATION BY MOUTH  EVERY 6 HOURS AS NEEDED   Ascorbic Acid (VITAMIN C) 1000 MG tablet Take 1 tablet (1,000 mg total) by mouth daily.   Cholecalciferol (VITAMIN D3) 125 MCG (5000 UT) CAPS Take 1 capsule (5,000 Units total) by mouth daily.   cloNIDine (CATAPRES) 0.2 MG tablet TAKE 1 TABLET BY MOUTH 3 TIMES  DAILY   Cyanocobalamin (VITAMIN B-12 PO) Take 1 tablet by mouth daily.   FLUoxetine (PROZAC) 20 MG capsule TAKE 1 CAPSULE BY MOUTH DAILY  WITH A 40 MG CAPSULE FOR TOTAL  60 MG DAILY   FLUoxetine (PROZAC) 40 MG capsule TAKE 1 CAPSULE BY MOUTH DAILY   fluticasone-salmeterol (ADVAIR HFA) 45-21 MCG/ACT inhaler Inhale 2 puffs into the lungs 2 (two) times daily.   hydrOXYzine (ATARAX) 25 MG tablet TAKE 1 TABLET BY MOUTH AT  BEDTIME AS NEEDED   levothyroxine (SYNTHROID) 50 MCG tablet TAKE 1 TABLET BY MOUTH DAILY  BEFORE BREAKFAST   oxyCODONE (OXY IR/ROXICODONE) 5 MG immediate release tablet Take 5 mg by mouth 4 (four) times daily as needed.   oxyCODONE-acetaminophen (PERCOCET/ROXICET) 5-325 MG tablet Take 1 tablet by mouth 4 (four) times daily as needed.   QUEtiapine Fumarate (SEROQUEL XR) 150 MG 24 hr tablet TAKE 1 TABLET BY MOUTH AT  BEDTIME   simvastatin (ZOCOR) 40 MG tablet TAKE 1 TABLET BY MOUTH DAILY   nystatin  cream (MYCOSTATIN) Apply 1 Application topically 2 (two) times daily. (Patient not taking: Reported on 03/07/2022)   No facility-administered encounter medications on file as of 03/07/2022.    Allergies (verified) Patient has no known allergies.   History: Past Medical History:  Diagnosis Date   Allergy    Common migraine    Depression    GERD (gastroesophageal reflux disease)    Hyperlipidemia    Hypertension    Insomnia    Menopausal symptom    Osteopenia    Thyroid disease    Past Surgical History:  Procedure Laterality Date   ANKLE FRACTURE SURGERY  06/18/15    COLONOSCOPY WITH PROPOFOL N/A 08/31/2017   Procedure: COLONOSCOPY WITH PROPOFOL;  Surgeon: Lin Landsman, MD;  Location: [facility] ENDOSCOPY;  Service: Gastroenterology;  Laterality: N/A;   TUBAL LIGATION     Family History  Problem Relation Age of Onset   Diabetes Mother    Heart disease Mother    Cirrhosis Mother    Heart disease Father    Social History   Socioeconomic History   Marital status: Divorced    Spouse name: Not on file   Number of children: Not on file   Years of education: Not on file   Highest education level: Not on file  Occupational History   Occupation: retired  Tobacco Use   Smoking status: Never   Smokeless tobacco: Never  Vaping Use   Vaping Use: Never used  Substance and Sexual Activity   Alcohol use: No    Alcohol/week: 0.0 standard drinks of alcohol   Drug use: Yes    Types: Oxycodone   Sexual activity: Not Currently    Birth control/protection: Surgical  Other Topics Concern   Not on file  Social History Narrative   Not on file   Social Determinants of Health   Financial Resource Strain: Low Risk  (03/07/2022)   Overall Financial Resource Strain (CARDIA)    Difficulty of Paying Living Expenses: Not hard at all  Food Insecurity: No Food Insecurity (03/07/2022)   Hunger Vital Sign    Worried About Running Out of Food in the Last Year: Never true    [place] in the Last Year: Never true  Transportation Needs: No Transportation Needs (03/07/2022)   PRAPARE - Hydrologist (Medical): No    Lack of Transportation (Non-Medical): No  Physical Activity: Insufficiently Active (03/07/2022)   Exercise Vital Sign    Days of Exercise per Week: 2 days    Minutes of Exercise per Session: 20 min  Stress: No Stress Concern Present (03/07/2022)   [place]    Feeling of Stress : Not at all  Social Connections: Moderately Isolated (03/07/2022)   Social  Connection and Isolation Panel [NHANES]    Frequency of Communication with Friends and Family: More than three times a week    Frequency of Social Gatherings with Friends and Family: Twice a week    Attends Religious Services: More than 4 times per year    Active Member of [employer] or Organizations: No    Attends Archivist Meetings: Never    Marital Status: Widowed    Tobacco Counseling Counseling given: Not Answered   Clinical Intake:  Pre-visit preparation completed: Yes  Pain : No/denies pain     Nutritional Risks: None Diabetes: No  How often do you need to have someone help you when you read instructions, pamphlets,  or other written materials from your doctor or pharmacy?: 1 - Never  Diabetic?no  Interpreter Needed?: No  Information entered by :: Kirke Shaggy LPN   Activities of Daily Living    03/07/2022    9:16 AM  In your present state of health, do you have any difficulty performing the following activities:  Hearing? 0  Vision? 0  Difficulty concentrating or making decisions? 0  Walking or climbing stairs? 0  Dressing or bathing? 0  Doing errands, shopping? 0  Preparing Food and eating ? N  Using the Toilet? N  In the past six months, have you accidently leaked urine? N  Do you have problems with loss of bowel control? N  Managing your Medications? N  Managing your Finances? N  Housekeeping or managing your Housekeeping? N    Patient Care Team: Jon Billings, NP as PCP - General  Indicate any recent Medical Services you may have received from other than [facility] providers in the past year (date may be approximate).     Assessment:   This is a routine wellness examination for Kayla Wright.  Hearing/Vision screen Hearing Screening - Comments:: No aids Vision Screening - Comments:: readers  Dietary issues and exercise activities discussed: Current Exercise Habits: Home exercise routine, Type of exercise: walking, Time (Minutes): 20, Frequency  (Times/Week): 2, Weekly Exercise (Minutes/Week): 40, Intensity: Mild   Goals Addressed             This Visit's Progress    DIET - EAT MORE FRUITS AND VEGETABLES         Depression Screen    03/07/2022    9:14 AM 11/01/2021    2:33 PM 10/19/2021    2:54 PM 07/13/2021    2:30 PM 04/21/2021    3:33 PM 12/23/2020   10:36 AM 11/30/2020    2:06 PM  PHQ 2/9 Scores  PHQ - 2 Score 0 6 5 3 4 1 4  $ PHQ- 9 Score 0 22 18 13 16 7 16    $ Fall Risk    03/07/2022    9:16 AM 11/01/2021    2:33 PM 10/19/2021    2:53 PM 04/21/2021    3:31 PM 12/23/2020   10:38 AM  Fall Risk   Falls in the past year? 0 0 0 0 0  Number falls in past yr: 0 0 0 0 0  Injury with Fall? 0 0 0 0 0  Risk for fall due to : No Fall Risks No Fall Risks No Fall Risks History of fall(s);No Fall Risks   Follow up Falls prevention discussed;Falls evaluation completed Falls evaluation completed Falls evaluation completed Falls evaluation completed Falls evaluation completed;Falls prevention discussed    FALL RISK PREVENTION PERTAINING TO THE HOME:  Any stairs in or around the home? Yes  If so, are there any without handrails? No  Home free of loose throw rugs in walkways, pet beds, electrical cords, etc? Yes  Adequate lighting in your home to reduce risk of falls? Yes   ASSISTIVE DEVICES UTILIZED TO PREVENT FALLS:  Life alert? No  Use of a cane, walker or w/c? Yes  Grab bars in the bathroom? Yes  Shower chair or bench in shower? Yes  Elevated toilet seat or a handicapped toilet? No    Cognitive Function:        03/07/2022    9:17 AM 12/23/2020   10:39 AM 12/23/2019   10:50 AM  6CIT Screen  What Year? 0 points 0 points 0 points  What month? 0 points 0 points 0 points  What time? 0 points 0 points 0 points  Count back from 20 0 points 0 points 0 points  Months in reverse 4 points 4 points 4 points  Repeat phrase 2 points 0 points 6 points  Total Score 6 points 4 points 10 points     Immunizations Immunization History  Administered Date(s) Administered   Fluad Quad(high Dose 65+) 10/19/2021   Influenza,inj,Quad PF,6+ Mos 11/04/2014, 11/02/2015, 11/15/2016, 10/23/2017, 11/21/2018, 11/27/2019   Influenza-Unspecified 11/06/2020   PFIZER(Purple Top)SARS-COV-2 Vaccination 10/07/2019, 10/28/2019   PNEUMOCOCCAL CONJUGATE-20 10/19/2021   Td 04/23/2004   Tdap 01/27/2016    TDAP status: Up to date  Flu Vaccine status: Up to date  Pneumococcal vaccine status: Up to date  Covid-19 vaccine status: Completed vaccines  Qualifies for Shingles Vaccine? Yes   Zostavax completed No   Shingrix Completed?: No.    Education has been provided regarding the importance of this vaccine. Patient has been advised to call insurance company to determine out of pocket expense if they have not yet received this vaccine. Advised may also receive vaccine at local pharmacy or Health Dept. Verbalized acceptance and understanding.  Screening Tests Health Maintenance  Topic Date Due   Zoster Vaccines- Shingrix (1 of 2) Never done   COVID-19 Vaccine (3 - Pfizer risk series) 11/25/2019   Medicare Annual Wellness (AWV)  03/08/2023   MAMMOGRAM  12/15/2023   PAP SMEAR-Modifier  11/30/2025   DTaP/Tdap/Td (3 - Td or Tdap) 01/26/2026   COLONOSCOPY (Pts 45-50yr Insurance coverage will need to be confirmed)  09/01/2027   Pneumonia Vaccine 66 Years old  Completed   INFLUENZA VACCINE  Completed   DEXA SCAN  Completed   Hepatitis C Screening  Completed   HIV Screening  Completed   HPV VACCINES  Aged Out    Health Maintenance  Health Maintenance Due  Topic Date Due   Zoster Vaccines- Shingrix (1 of 2) Never done   COVID-19 Vaccine (3 - Pfizer risk series) 11/25/2019    Colorectal cancer screening: Type of screening: Colonoscopy. Completed 08/31/17. Repeat every 10 years  Mammogram status: Completed 12/14/21. Repeat every year  Bone Density status: Completed 12/14/21. Results reflect:  Bone density results: OSTEOPENIA. Repeat every 5 years.  Lung Cancer Screening: (Low Dose CT Chest recommended if Age 66-80years, 30 pack-year currently smoking OR have quit w/in 15years.) does not qualify.   Additional Screening:  Hepatitis C Screening: does qualify; Completed 02/08/21  Vision Screening: Recommended annual ophthalmology exams for early detection of glaucoma and other disorders of the eye. Is the patient up to date with their annual eye exam?  No  Who is the provider or what is the name of the office in which the patient attends annual eye exams? No one If pt is not established with a provider, would they like to be referred to a provider to establish care? No .   Dental Screening: Recommended annual dental exams for proper oral hygiene  Community Resource Referral / Chronic Care Management: CRR required this visit?  No   CCM required this visit?  No      Plan:     I have personally reviewed and noted the following in the patient's chart:   Medical and social history Use of alcohol, tobacco or illicit drugs  Current medications and supplements including opioid prescriptions. Patient is currently taking opioid prescriptions. Information provided to patient regarding non-opioid alternatives. Patient advised to discuss non-opioid treatment plan with  their provider. Functional ability and status Nutritional status Physical activity Advanced directives List of other physicians Hospitalizations, surgeries, and ER visits in previous 12 months Vitals Screenings to include cognitive, depression, and falls Referrals and appointments  In addition, I have reviewed and discussed with patient certain preventive protocols, quality metrics, and best practice recommendations. A written personalized care plan for preventive services as well as general preventive health recommendations were provided to patient.     Dionisio David, LPN   [id number]   Nurse Notes:  none

## 2022-03-07 NOTE — Patient Instructions (Signed)
Kayla Wright , Thank you for taking time to come for your Medicare Wellness Visit. I appreciate your ongoing commitment to your health goals. Please review the following plan we discussed and let me know if I can assist you in the future.   These are the goals we discussed:  Goals      DIET - EAT MORE FRUITS AND VEGETABLES     Patient Stated     12/23/2019, no goals     Patient Stated     No goals         This is a list of the screening recommended for you and due dates:  Health Maintenance  Topic Date Due   Zoster (Shingles) Vaccine (1 of 2) Never done   COVID-19 Vaccine (3 - Pfizer risk series) 11/25/2019   Medicare Annual Wellness Visit  03/08/2023   Mammogram  12/15/2023   Pap Smear  11/30/2025   DTaP/Tdap/Td vaccine (3 - Td or Tdap) 01/26/2026   Colon Cancer Screening  09/01/2027   Pneumonia Vaccine  Completed   Flu Shot  Completed   DEXA scan (bone density measurement)  Completed   Hepatitis C Screening: USPSTF Recommendation to screen - Ages 20-79 yo.  Completed   HIV Screening  Completed   HPV Vaccine  Aged Out    Advanced directives: no  Conditions/risks identified: none  Next appointment: Follow up in one year for your annual wellness visit 03/13/23 @ 9:15 am by phone   Preventive Care 65 Years and Older, Female Preventive care refers to lifestyle choices and visits with your health care provider that can promote health and wellness. What does preventive care include? A yearly physical exam. This is also called an annual well check. Dental exams once or twice a year. Routine eye exams. Ask your health care provider how often you should have your eyes checked. Personal lifestyle choices, including: Daily care of your teeth and gums. Regular physical activity. Eating a healthy diet. Avoiding tobacco and drug use. Limiting alcohol use. Practicing safe sex. Taking low-dose aspirin every day. Taking vitamin and mineral supplements as recommended by your health  care provider. What happens during an annual well check? The services and screenings done by your health care provider during your annual well check will depend on your age, overall health, lifestyle risk factors, and family history of disease. Counseling  Your health care provider may ask you questions about your: Alcohol use. Tobacco use. Drug use. Emotional well-being. Home and relationship well-being. Sexual activity. Eating habits. History of falls. Memory and ability to understand (cognition). Work and work Statistician. Reproductive health. Screening  You may have the following tests or measurements: Height, weight, and BMI. Blood pressure. Lipid and cholesterol levels. These may be checked every 5 years, or more frequently if you are over 53 years old. Skin check. Lung cancer screening. You may have this screening every year starting at age 6 if you have a 30-pack-year history of smoking and currently smoke or have quit within the past 15 years. Fecal occult blood test (FOBT) of the stool. You may have this test every year starting at age 46. Flexible sigmoidoscopy or colonoscopy. You may have a sigmoidoscopy every 5 years or a colonoscopy every 10 years starting at age 59. Hepatitis C blood test. Hepatitis B blood test. Sexually transmitted disease (STD) testing. Diabetes screening. This is done by checking your blood sugar (glucose) after you have not eaten for a while (fasting). You may have this done every 1-3  years. Bone density scan. This is done to screen for osteoporosis. You may have this done starting at age 80. Mammogram. This may be done every 1-2 years. Talk to your health care provider about how often you should have regular mammograms. Talk with your health care provider about your test results, treatment options, and if necessary, the need for more tests. Vaccines  Your health care provider may recommend certain vaccines, such as: Influenza vaccine. This is  recommended every year. Tetanus, diphtheria, and acellular pertussis (Tdap, Td) vaccine. You may need a Td booster every 10 years. Zoster vaccine. You may need this after age 76. Pneumococcal 13-valent conjugate (PCV13) vaccine. One dose is recommended after age 66. Pneumococcal polysaccharide (PPSV23) vaccine. One dose is recommended after age 81. Talk to your health care provider about which screenings and vaccines you need and how often you need them. This information is not intended to replace advice given to you by your health care provider. Make sure you discuss any questions you have with your health care provider. Document Released: 02/06/2015 Document Revised: 09/30/2015 Document Reviewed: 11/11/2014 Elsevier Interactive Patient Education  2017 [place] Prevention in the Home Falls can cause injuries. They can happen to people of all ages. There are many things you can do to make your home safe and to help prevent falls. What can I do on the outside of my home? Regularly fix the edges of walkways and driveways and fix any cracks. Remove anything that might make you trip as you walk through a door, such as a raised step or threshold. Trim any bushes or trees on the path to your home. Use bright outdoor lighting. Clear any walking paths of anything that might make someone trip, such as rocks or tools. Regularly check to see if handrails are loose or broken. Make sure that both sides of any steps have handrails. Any raised decks and porches should have guardrails on the edges. Have any leaves, snow, or ice cleared regularly. Use sand or salt on walking paths during winter. Clean up any spills in your garage right away. This includes oil or grease spills. What can I do in the bathroom? Use night lights. Install grab bars by the toilet and in the tub and shower. Do not use towel bars as grab bars. Use non-skid mats or decals in the tub or shower. If you need to sit down in  the shower, use a plastic, non-slip stool. Keep the floor dry. Clean up any water that spills on the floor as soon as it happens. Remove soap buildup in the tub or shower regularly. Attach bath mats securely with double-sided non-slip rug tape. Do not have throw rugs and other things on the floor that can make you trip. What can I do in the bedroom? Use night lights. Make sure that you have a light by your bed that is easy to reach. Do not use any sheets or blankets that are too big for your bed. They should not hang down onto the floor. Have a firm chair that has side arms. You can use this for support while you get dressed. Do not have throw rugs and other things on the floor that can make you trip. What can I do in the kitchen? Clean up any spills right away. Avoid walking on wet floors. Keep items that you use a lot in easy-to-reach places. If you need to reach something above you, use a strong step stool that has a grab  bar. Keep electrical cords out of the way. Do not use floor polish or wax that makes floors slippery. If you must use wax, use non-skid floor wax. Do not have throw rugs and other things on the floor that can make you trip. What can I do with my stairs? Do not leave any items on the stairs. Make sure that there are handrails on both sides of the stairs and use them. Fix handrails that are broken or loose. Make sure that handrails are as long as the stairways. Check any carpeting to make sure that it is firmly attached to the stairs. Fix any carpet that is loose or worn. Avoid having throw rugs at the top or bottom of the stairs. If you do have throw rugs, attach them to the floor with carpet tape. Make sure that you have a light switch at the top of the stairs and the bottom of the stairs. If you do not have them, ask someone to add them for you. What else can I do to help prevent falls? Wear shoes that: Do not have high heels. Have rubber bottoms. Are comfortable  and fit you well. Are closed at the toe. Do not wear sandals. If you use a stepladder: Make sure that it is fully opened. Do not climb a closed stepladder. Make sure that both sides of the stepladder are locked into place. Ask someone to hold it for you, if possible. Clearly mark and make sure that you can see: Any grab bars or handrails. First and last steps. Where the edge of each step is. Use tools that help you move around (mobility aids) if they are needed. These include: Canes. Walkers. Scooters. Crutches. Turn on the lights when you go into a dark area. Replace any light bulbs as soon as they burn out. Set up your furniture so you have a clear path. Avoid moving your furniture around. If any of your floors are uneven, fix them. If there are any pets around you, be aware of where they are. Review your medicines with your doctor. Some medicines can make you feel dizzy. This can increase your chance of falling. Ask your doctor what other things that you can do to help prevent falls. This information is not intended to replace advice given to you by your health care provider. Make sure you discuss any questions you have with your health care provider. Document Released: 11/06/2008 Document Revised: 06/18/2015 Document Reviewed: 02/14/2014 Elsevier Interactive Patient Education  2017 [employer].

## 2022-03-07 NOTE — Telephone Encounter (Signed)
Unable to refill per protocol, Rx request is too soon. Last refill 06/15/21 for 30 and 11 refills.  Requested Prescriptions  Pending Prescriptions Disp Refills   simvastatin (ZOCOR) 40 MG tablet [Pharmacy Med Name: Simvastatin 40 MG Oral Tablet] 100 tablet 2    Sig: TAKE 1 TABLET BY MOUTH DAILY     Cardiovascular:  Antilipid - Statins Failed - 03/06/2022 10:42 PM      Failed - Lipid Panel in normal range within the last 12 months    Cholesterol, Total  Date Value Ref Range Status  10/19/2021 162 100 - 199 mg/dL Final   LDL Chol Calc (NIH)  Date Value Ref Range Status  10/19/2021 92 0 - 99 mg/dL Final   HDL  Date Value Ref Range Status  10/19/2021 39 (L) >39 mg/dL Final   Triglycerides  Date Value Ref Range Status  10/19/2021 181 (H) 0 - 149 mg/dL Final         Passed - Patient is not pregnant      Passed - Valid encounter within last 12 months    Recent Outpatient Visits           3 months ago Pneumonia of left lower lobe due to infectious organism   [place] [facility] Mecum, Erin E, PA-C   3 months ago Pneumonia of left lower lobe due to infectious organism   [place], Megan P, DO   4 months ago Moderate persistent asthma with acute exacerbation   [place] [facility] Mecum, Erin E, PA-C   4 months ago Stage 3 chronic kidney disease, unspecified whether stage 3a or 3b CKD ([place])   [place] Jon Billings, NP   7 months ago Bone spur of left foot   [place], Barb Merino, DO       Future Appointments             In 1 month Jon Billings, NP [place], [facility]             levothyroxine (SYNTHROID) 50 MCG tablet [Pharmacy Med Name: Levothyroxine Sodium 50 MCG Oral Tablet] 100 tablet 2    Sig: TAKE 1 TABLET BY MOUTH DAILY  BEFORE BREAKFAST     Endocrinology:  Hypothyroid Agents Passed - 03/06/2022 10:42 PM       Passed - TSH in normal range and within 360 days    TSH  Date Value Ref Range Status  10/19/2021 2.260 0.450 - 4.500 uIU/mL Final         Passed - Valid encounter within last 12 months    Recent Outpatient Visits           3 months ago Pneumonia of left lower lobe due to infectious organism   [place], Erin E, PA-C   3 months ago Pneumonia of left lower lobe due to infectious organism   [place], Megan P, DO   4 months ago Moderate persistent asthma with acute exacerbation   [place] [facility] Mecum, Erin E, PA-C   4 months ago Stage 3 chronic kidney disease, unspecified whether stage 3a or 3b CKD ([place])   [place], Karen, NP   7 months ago Bone spur of left foot   [place], Barb Merino, DO       Future Appointments  In 1 month Jon Billings, NP [place]

## 2022-03-19 ENCOUNTER — Other Ambulatory Visit: Payer: Self-pay | Admitting: Nurse Practitioner

## 2022-03-21 DIAGNOSIS — Z9181 History of falling: Secondary | ICD-10-CM | POA: Diagnosis not present

## 2022-03-21 DIAGNOSIS — N1831 Chronic kidney disease, stage 3a: Secondary | ICD-10-CM | POA: Diagnosis not present

## 2022-03-21 DIAGNOSIS — Z79899 Other long term (current) drug therapy: Secondary | ICD-10-CM | POA: Diagnosis not present

## 2022-03-21 DIAGNOSIS — Z79891 Long term (current) use of opiate analgesic: Secondary | ICD-10-CM | POA: Diagnosis not present

## 2022-03-21 DIAGNOSIS — R030 Elevated blood-pressure reading, without diagnosis of hypertension: Secondary | ICD-10-CM | POA: Diagnosis not present

## 2022-03-21 DIAGNOSIS — E559 Vitamin D deficiency, unspecified: Secondary | ICD-10-CM | POA: Diagnosis not present

## 2022-03-21 DIAGNOSIS — G894 Chronic pain syndrome: Secondary | ICD-10-CM | POA: Diagnosis not present

## 2022-03-21 DIAGNOSIS — J449 Chronic obstructive pulmonary disease, unspecified: Secondary | ICD-10-CM | POA: Diagnosis not present

## 2022-03-21 DIAGNOSIS — M25572 Pain in left ankle and joints of left foot: Secondary | ICD-10-CM | POA: Diagnosis not present

## 2022-03-21 DIAGNOSIS — N183 Chronic kidney disease, stage 3 unspecified: Secondary | ICD-10-CM | POA: Diagnosis not present

## 2022-03-21 NOTE — Telephone Encounter (Signed)
Requested medications are due for refill today.  no  Requested medications are on the active medications list.  yes  Last refill. 05/2021 #30 11 rf  Future visit scheduled.   yes  Notes to clinic.  Refill not delegated.    Requested Prescriptions  Pending Prescriptions Disp Refills   QUEtiapine Fumarate (SEROQUEL XR) 150 MG 24 hr tablet [Pharmacy Med Name: QUETIAPINE  '150MG'$   TAB  XR] 30 tablet 11    Sig: TAKE 1 TABLET BY MOUTH AT  BEDTIME     Not Delegated - Psychiatry:  Antipsychotics - Second Generation (Atypical) - quetiapine Failed - 03/19/2022 10:20 PM      Failed - This refill cannot be delegated      Failed - Lipid Panel in normal range within the last 12 months    Cholesterol, Total  Date Value Ref Range Status  10/19/2021 162 100 - 199 mg/dL Final   LDL Chol Calc (NIH)  Date Value Ref Range Status  10/19/2021 92 0 - 99 mg/dL Final   HDL  Date Value Ref Range Status  10/19/2021 39 (L) >39 mg/dL Final   Triglycerides  Date Value Ref Range Status  10/19/2021 181 (H) 0 - 149 mg/dL Final         Failed - CBC within normal limits and completed in the last 12 months    WBC  Date Value Ref Range Status  03/22/2021 6.5  Final  09/18/2017 8.8 3.6 - 11.0 K/uL Final   RBC  Date Value Ref Range Status  03/22/2021 4.1 3.87 - 5.11 Final   Hemoglobin  Date Value Ref Range Status  03/22/2021 11.4 (A) 12.0 - 16.0 Final  02/26/2018 13.6 11.1 - 15.9 g/dL Final   HCT  Date Value Ref Range Status  03/22/2021 35 (A) 36 - 46 Final   Hematocrit  Date Value Ref Range Status  02/26/2018 40.8 34.0 - 46.6 % Final   MCHC  Date Value Ref Range Status  02/26/2018 33.3 31.5 - 35.7 g/dL Final  09/18/2017 34.2 32.0 - 36.0 g/dL Final   [facility]  Date Value Ref Range Status  02/26/2018 30.6 26.6 - 33.0 pg Final  09/18/2017 31.6 26.0 - 34.0 pg Final   MCV  Date Value Ref Range Status  02/26/2018 92 79 - 97 fL Final   No results found for: "PLTCOUNTKUC", "LABPLAT",  "POCPLA" RDW  Date Value Ref Range Status  02/26/2018 14.3 11.7 - 15.4 % Final         Passed - TSH in normal range and within 360 days    TSH  Date Value Ref Range Status  10/19/2021 2.260 0.450 - 4.500 uIU/mL Final         Passed - Completed PHQ-2 or PHQ-9 in the last 360 days      Passed - Last BP in normal range    BP Readings from Last 1 Encounters:  11/26/21 110/70         Passed - Last Heart Rate in normal range    Pulse Readings from Last 1 Encounters:  11/26/21 (!) 73         Passed - Valid encounter within last 6 months    Recent Outpatient Visits           3 months ago Pneumonia of left lower lobe due to infectious organism   [place] [facility] Mecum, Erin E, PA-C   4 months ago Pneumonia of left lower lobe due to infectious organism  [place], Megan P, DO   4 months ago Moderate persistent asthma with acute exacerbation    [facility] Mecum, Erin E, PA-C   5 months ago Stage 3 chronic kidney disease, unspecified whether stage 3a or 3b CKD ([place])   [place] Jon Billings, NP   8 months ago Bone spur of left foot   [place], [place], DO       Future Appointments             In 4 weeks Jon Billings, NP [place], [facility]            Passed - CMP within normal limits and completed in the last 12 months    Albumin  Date Value Ref Range Status  10/19/2021 4.3 3.9 - 4.9 g/dL Final   Alkaline Phosphatase  Date Value Ref Range Status  10/19/2021 82 44 - 121 IU/L Final   ALT  Date Value Ref Range Status  10/19/2021 24 0 - 32 IU/L Final   AST  Date Value Ref Range Status  10/19/2021 20 0 - 40 IU/L Final   BUN  Date Value Ref Range Status  10/19/2021 11 8 - 27 mg/dL Final   Calcium  Date Value Ref Range Status  10/19/2021 9.3 8.7 - 10.3 mg/dL Final   CO2  Date Value Ref  Range Status  10/19/2021 22 20 - 29 mmol/L Final   Creatinine, Ser  Date Value Ref Range Status  10/19/2021 1.05 (H) 0.57 - 1.00 mg/dL Final   Glucose  Date Value Ref Range Status  10/19/2021 117 (H) 70 - 99 mg/dL Final  03/22/2021 112  Final   Glucose, Bld  Date Value Ref Range Status  09/18/2017 99 70 - 99 mg/dL Final   Potassium  Date Value Ref Range Status  10/19/2021 4.0 3.5 - 5.2 mmol/L Final   Sodium  Date Value Ref Range Status  10/19/2021 139 134 - 144 mmol/L Final   Bilirubin Total  Date Value Ref Range Status  10/19/2021 0.8 0.0 - 1.2 mg/dL Final   Bilirubin, Direct  Date Value Ref Range Status  09/18/2017 0.1 0.0 - 0.2 mg/dL Final   Indirect Bilirubin  Date Value Ref Range Status  09/18/2017 0.7 0.3 - 0.9 mg/dL Final    Comment:    Performed at [facility], [address] [place], [place] [address]   [place]  Date Value Ref Range Status  05/15/2020 Negative Negative/Trace Final   Total Protein  Date Value Ref Range Status  10/19/2021 6.5 6.0 - 8.5 g/dL Final   GFR calc Af Amer  Date Value Ref Range Status  07/15/2019 59 (L) >59 mL/min/1.73 Final    Comment:    **[facility] currently reports eGFR in compliance with the current**   recommendations of the [employer]. [facility] will   update reporting as new guidelines are published from the NKF-ASN   Task force.    eGFR  Date Value Ref Range Status  10/19/2021 59 (L) >59 mL/min/1.73 Final   GFR calc non Af Amer  Date Value Ref Range Status  07/15/2019 51 (L) >59 mL/min/1.73 Final

## 2022-03-26 ENCOUNTER — Other Ambulatory Visit: Payer: Self-pay | Admitting: Nurse Practitioner

## 2022-03-28 NOTE — Telephone Encounter (Signed)
Last RF 07/12/21 #270 3 RF  Requested Prescriptions  Refused Prescriptions Disp Refills   cloNIDine (CATAPRES) 0.2 MG tablet [Pharmacy Med Name: cloNIDine HCl 0.2 MG Oral Tablet] 300 tablet 2    Sig: TAKE 1 TABLET BY MOUTH 3 TIMES  DAILY     Cardiovascular:  Alpha-2 Agonists Passed - 03/26/2022 10:25 PM      Passed - Last BP in normal range    BP Readings from Last 1 Encounters:  11/26/21 110/70         Passed - Last Heart Rate in normal range    Pulse Readings from Last 1 Encounters:  11/26/21 (!) 63         Passed - Valid encounter within last 6 months    Recent Outpatient Visits           4 months ago Pneumonia of left lower lobe due to infectious organism   [place], Erin E, PA-C   4 months ago Pneumonia of left lower lobe due to infectious organism   [place], Megan P, DO   4 months ago Moderate persistent asthma with acute exacerbation   [place] [facility] Mecum, Erin E, PA-C   5 months ago Stage 3 chronic kidney disease, unspecified whether stage 3a or 3b CKD ([place])   [place] Jon Billings, NP   8 months ago Bone spur of left foot   [place], Barb Merino, DO       Future Appointments             In 3 weeks Jon Billings, NP [place], [facility]

## 2022-04-13 ENCOUNTER — Other Ambulatory Visit: Payer: Self-pay | Admitting: Nurse Practitioner

## 2022-04-14 NOTE — Telephone Encounter (Signed)
Unable to refill per protocol, Rx request is too soon. Last refill 07/12/21 for 90 days and 3 refills.  Requested Prescriptions  Pending Prescriptions Disp Refills   cloNIDine (CATAPRES) 0.2 MG tablet [Pharmacy Med Name: cloNIDine HCl 0.2 MG Oral Tablet] 300 tablet 2    Sig: TAKE 1 TABLET BY MOUTH 3 TIMES  DAILY     Cardiovascular:  Alpha-2 Agonists Passed - 04/13/2022 10:23 PM      Passed - Last BP in normal range    BP Readings from Last 1 Encounters:  11/26/21 110/70         Passed - Last Heart Rate in normal range    Pulse Readings from Last 1 Encounters:  11/26/21 (!) 59         Passed - Valid encounter within last 6 months    Recent Outpatient Visits           4 months ago Pneumonia of left lower lobe due to infectious organism   [place] [facility] Mecum, Erin E, PA-C   5 months ago Pneumonia of left lower lobe due to infectious organism   [place], Megan P, DO   5 months ago Moderate persistent asthma with acute exacerbation    [facility] Mecum, Erin E, PA-C   5 months ago Stage 3 chronic kidney disease, unspecified whether stage 3a or 3b CKD ([place])   [place], NP   9 months ago Bone spur of left foot   [place], Barb Merino, DO       Future Appointments             In 5 days Jon Billings, NP [place], [facility]

## 2022-04-14 NOTE — Telephone Encounter (Signed)
Requested medication (s) are due for refill today - no  Requested medication (s) are on the active medication list -yes  Future visit scheduled -yes  Last refill: 05/25/21 #30 11RF  Notes to clinic: non delegated Rx  Requested Prescriptions  Pending Prescriptions Disp Refills   QUEtiapine Fumarate (SEROQUEL XR) 150 MG 24 hr tablet [Pharmacy Med Name: QUETIAPINE  150MG   TAB  XR] 30 tablet 11    Sig: TAKE 1 TABLET BY MOUTH AT  BEDTIME     Not Delegated - Psychiatry:  Antipsychotics - Second Generation (Atypical) - quetiapine Failed - 04/13/2022 10:23 PM      Failed - This refill cannot be delegated      Failed - Lipid Panel in normal range within the last 12 months    Cholesterol, Total  Date Value Ref Range Status  10/19/2021 162 100 - 199 mg/dL Final   LDL Chol Calc (NIH)  Date Value Ref Range Status  10/19/2021 92 0 - 99 mg/dL Final   HDL  Date Value Ref Range Status  10/19/2021 39 (L) >39 mg/dL Final   Triglycerides  Date Value Ref Range Status  10/19/2021 181 (H) 0 - 149 mg/dL Final         Failed - CBC within normal limits and completed in the last 12 months    WBC  Date Value Ref Range Status  03/22/2021 6.5  Final  09/18/2017 8.8 3.6 - 11.0 K/uL Final   RBC  Date Value Ref Range Status  03/22/2021 4.1 3.87 - 5.11 Final   Hemoglobin  Date Value Ref Range Status  03/22/2021 11.4 (A) 12.0 - 16.0 Final  02/26/2018 13.6 11.1 - 15.9 g/dL Final   HCT  Date Value Ref Range Status  03/22/2021 35 (A) 36 - 46 Final   Hematocrit  Date Value Ref Range Status  02/26/2018 40.8 34.0 - 46.6 % Final   MCHC  Date Value Ref Range Status  02/26/2018 33.3 31.5 - 35.7 g/dL Final  09/18/2017 34.2 32.0 - 36.0 g/dL Final   [facility]  Date Value Ref Range Status  02/26/2018 30.6 26.6 - 33.0 pg Final  09/18/2017 31.6 26.0 - 34.0 pg Final   MCV  Date Value Ref Range Status  02/26/2018 92 79 - 97 fL Final   No results found for: "PLTCOUNTKUC", "LABPLAT", "POCPLA" RDW  Date  Value Ref Range Status  02/26/2018 14.3 11.7 - 15.4 % Final         Passed - TSH in normal range and within 360 days    TSH  Date Value Ref Range Status  10/19/2021 2.260 0.450 - 4.500 uIU/mL Final         Passed - Completed PHQ-2 or PHQ-9 in the last 360 days      Passed - Last BP in normal range    BP Readings from Last 1 Encounters:  11/26/21 110/70         Passed - Last Heart Rate in normal range    Pulse Readings from Last 1 Encounters:  11/26/21 (!) 88         Passed - Valid encounter within last 6 months    Recent Outpatient Visits           4 months ago Pneumonia of left lower lobe due to infectious organism   [place] [facility] Mecum, Erin E, PA-C   5 months ago Pneumonia of left lower lobe due to infectious organism   [place]  [facility] [place], Megan P, DO   5 months ago Moderate persistent asthma with acute exacerbation    [facility] Mecum, Erin E, PA-C   5 months ago Stage 3 chronic kidney disease, unspecified whether stage 3a or 3b CKD ([place])   [place] Jon Billings, NP   9 months ago Bone spur of left foot   [place], [place], DO       Future Appointments             In 5 days Jon Billings, NP [place], [facility]            Passed - CMP within normal limits and completed in the last 12 months    Albumin  Date Value Ref Range Status  10/19/2021 4.3 3.9 - 4.9 g/dL Final   Alkaline Phosphatase  Date Value Ref Range Status  10/19/2021 82 44 - 121 IU/L Final   ALT  Date Value Ref Range Status  10/19/2021 24 0 - 32 IU/L Final   AST  Date Value Ref Range Status  10/19/2021 20 0 - 40 IU/L Final   BUN  Date Value Ref Range Status  10/19/2021 11 8 - 27 mg/dL Final   Calcium  Date Value Ref Range Status  10/19/2021 9.3 8.7 - 10.3 mg/dL Final   CO2  Date Value Ref Range Status   10/19/2021 22 20 - 29 mmol/L Final   Creatinine, Ser  Date Value Ref Range Status  10/19/2021 1.05 (H) 0.57 - 1.00 mg/dL Final   Glucose  Date Value Ref Range Status  10/19/2021 117 (H) 70 - 99 mg/dL Final  03/22/2021 112  Final   Glucose, Bld  Date Value Ref Range Status  09/18/2017 99 70 - 99 mg/dL Final   Potassium  Date Value Ref Range Status  10/19/2021 4.0 3.5 - 5.2 mmol/L Final   Sodium  Date Value Ref Range Status  10/19/2021 139 134 - 144 mmol/L Final   Bilirubin Total  Date Value Ref Range Status  10/19/2021 0.8 0.0 - 1.2 mg/dL Final   Bilirubin, Direct  Date Value Ref Range Status  09/18/2017 0.1 0.0 - 0.2 mg/dL Final   Indirect Bilirubin  Date Value Ref Range Status  09/18/2017 0.7 0.3 - 0.9 mg/dL Final    Comment:    Performed at [facility], [address] [place], [place] [address]   [place]  Date Value Ref Range Status  05/15/2020 Negative Negative/Trace Final   Total Protein  Date Value Ref Range Status  10/19/2021 6.5 6.0 - 8.5 g/dL Final   GFR calc Af Amer  Date Value Ref Range Status  07/15/2019 59 (L) >59 mL/min/1.73 Final    Comment:    **[facility] currently reports eGFR in compliance with the current**   recommendations of the [employer]. [facility] will   update reporting as new guidelines are published from the NKF-ASN   Task force.    eGFR  Date Value Ref Range Status  10/19/2021 59 (L) >59 mL/min/1.73 Final   GFR calc non Af Amer  Date Value Ref Range Status  07/15/2019 51 (L) >59 mL/min/1.73 Final            Requested Prescriptions  Pending Prescriptions Disp Refills   QUEtiapine Fumarate (SEROQUEL XR) 150 MG 24 hr tablet [Pharmacy Med Name: QUETIAPINE  150MG   TAB  XR] 30 tablet 11    Sig: TAKE 1 TABLET  BY MOUTH AT  BEDTIME     Not Delegated - Psychiatry:  Antipsychotics - Second Generation (Atypical) - quetiapine Failed - 04/13/2022 10:23 PM      Failed - This refill cannot be delegated       Failed - Lipid Panel in normal range within the last 12 months    Cholesterol, Total  Date Value Ref Range Status  10/19/2021 162 100 - 199 mg/dL Final   LDL Chol Calc (NIH)  Date Value Ref Range Status  10/19/2021 92 0 - 99 mg/dL Final   HDL  Date Value Ref Range Status  10/19/2021 39 (L) >39 mg/dL Final   Triglycerides  Date Value Ref Range Status  10/19/2021 181 (H) 0 - 149 mg/dL Final         Failed - CBC within normal limits and completed in the last 12 months    WBC  Date Value Ref Range Status  03/22/2021 6.5  Final  09/18/2017 8.8 3.6 - 11.0 K/uL Final   RBC  Date Value Ref Range Status  03/22/2021 4.1 3.87 - 5.11 Final   Hemoglobin  Date Value Ref Range Status  03/22/2021 11.4 (A) 12.0 - 16.0 Final  02/26/2018 13.6 11.1 - 15.9 g/dL Final   HCT  Date Value Ref Range Status  03/22/2021 35 (A) 36 - 46 Final   Hematocrit  Date Value Ref Range Status  02/26/2018 40.8 34.0 - 46.6 % Final   MCHC  Date Value Ref Range Status  02/26/2018 33.3 31.5 - 35.7 g/dL Final  09/18/2017 34.2 32.0 - 36.0 g/dL Final   [facility]  Date Value Ref Range Status  02/26/2018 30.6 26.6 - 33.0 pg Final  09/18/2017 31.6 26.0 - 34.0 pg Final   MCV  Date Value Ref Range Status  02/26/2018 92 79 - 97 fL Final   No results found for: "PLTCOUNTKUC", "LABPLAT", "POCPLA" RDW  Date Value Ref Range Status  02/26/2018 14.3 11.7 - 15.4 % Final         Passed - TSH in normal range and within 360 days    TSH  Date Value Ref Range Status  10/19/2021 2.260 0.450 - 4.500 uIU/mL Final         Passed - Completed PHQ-2 or PHQ-9 in the last 360 days      Passed - Last BP in normal range    BP Readings from Last 1 Encounters:  11/26/21 110/70         Passed - Last Heart Rate in normal range    Pulse Readings from Last 1 Encounters:  11/26/21 (!) 59         Passed - Valid encounter within last 6 months    Recent Outpatient Visits           4 months ago Pneumonia of left  lower lobe due to infectious organism   [place] [facility] Mecum, Erin E, PA-C   5 months ago Pneumonia of left lower lobe due to infectious organism   [place], Megan P, DO   5 months ago Moderate persistent asthma with acute exacerbation   [place] [facility] Mecum, Erin E, PA-C   5 months ago Stage 3 chronic kidney disease, unspecified whether stage 3a or 3b CKD ([place])   [place] Jon Billings, NP   9 months ago Bone spur of left foot   [place], [place], DO  Future Appointments             In 5 days Jon Billings, NP [place], [facility]            Passed - CMP within normal limits and completed in the last 12 months    Albumin  Date Value Ref Range Status  10/19/2021 4.3 3.9 - 4.9 g/dL Final   Alkaline Phosphatase  Date Value Ref Range Status  10/19/2021 82 44 - 121 IU/L Final   ALT  Date Value Ref Range Status  10/19/2021 24 0 - 32 IU/L Final   AST  Date Value Ref Range Status  10/19/2021 20 0 - 40 IU/L Final   BUN  Date Value Ref Range Status  10/19/2021 11 8 - 27 mg/dL Final   Calcium  Date Value Ref Range Status  10/19/2021 9.3 8.7 - 10.3 mg/dL Final   CO2  Date Value Ref Range Status  10/19/2021 22 20 - 29 mmol/L Final   Creatinine, Ser  Date Value Ref Range Status  10/19/2021 1.05 (H) 0.57 - 1.00 mg/dL Final   Glucose  Date Value Ref Range Status  10/19/2021 117 (H) 70 - 99 mg/dL Final  03/22/2021 112  Final   Glucose, Bld  Date Value Ref Range Status  09/18/2017 99 70 - 99 mg/dL Final   Potassium  Date Value Ref Range Status  10/19/2021 4.0 3.5 - 5.2 mmol/L Final   Sodium  Date Value Ref Range Status  10/19/2021 139 134 - 144 mmol/L Final   Bilirubin Total  Date Value Ref Range Status  10/19/2021 0.8 0.0 - 1.2 mg/dL Final   Bilirubin, Direct  Date Value Ref Range  Status  09/18/2017 0.1 0.0 - 0.2 mg/dL Final   Indirect Bilirubin  Date Value Ref Range Status  09/18/2017 0.7 0.3 - 0.9 mg/dL Final    Comment:    Performed at [facility], [address] [place], [place] [address]   [place]  Date Value Ref Range Status  05/15/2020 Negative Negative/Trace Final   Total Protein  Date Value Ref Range Status  10/19/2021 6.5 6.0 - 8.5 g/dL Final   GFR calc Af Amer  Date Value Ref Range Status  07/15/2019 59 (L) >59 mL/min/1.73 Final    Comment:    **[facility] currently reports eGFR in compliance with the current**   recommendations of the [employer]. [facility] will   update reporting as new guidelines are published from the NKF-ASN   Task force.    eGFR  Date Value Ref Range Status  10/19/2021 59 (L) >59 mL/min/1.73 Final   GFR calc non Af Amer  Date Value Ref Range Status  07/15/2019 51 (L) >59 mL/min/1.73 Final

## 2022-04-16 ENCOUNTER — Other Ambulatory Visit: Payer: Self-pay | Admitting: Nurse Practitioner

## 2022-04-18 NOTE — Telephone Encounter (Signed)
Unable to refill per protocol, Rx request is too soon. Last refill 05/24/21 for 30 days and 11 refills.  Requested Prescriptions  Pending Prescriptions Disp Refills   simvastatin (ZOCOR) 40 MG tablet [Pharmacy Med Name: Simvastatin 40 MG Oral Tablet] 60 tablet 5    Sig: TAKE 1 TABLET BY MOUTH DAILY     Cardiovascular:  Antilipid - Statins Failed - 04/16/2022 10:45 PM      Failed - Lipid Panel in normal range within the last 12 months    Cholesterol, Total  Date Value Ref Range Status  10/19/2021 162 100 - 199 mg/dL Final   LDL Chol Calc (NIH)  Date Value Ref Range Status  10/19/2021 92 0 - 99 mg/dL Final   HDL  Date Value Ref Range Status  10/19/2021 39 (L) >39 mg/dL Final   Triglycerides  Date Value Ref Range Status  10/19/2021 181 (H) 0 - 149 mg/dL Final         Passed - Patient is not pregnant      Passed - Valid encounter within last 12 months    Recent Outpatient Visits           4 months ago Pneumonia of left lower lobe due to infectious organism   [place], Erin E, PA-C   5 months ago Pneumonia of left lower lobe due to infectious organism   [place], Megan P, DO   5 months ago Moderate persistent asthma with acute exacerbation   [place] [facility] Mecum, Erin E, PA-C   6 months ago Stage 3 chronic kidney disease, unspecified whether stage 3a or 3b CKD ([place])   [place] Jon Billings, NP   9 months ago Bone spur of left foot   [place], Barb Merino, DO       Future Appointments             In 3 weeks Jon Billings, NP [place], [facility]             levothyroxine (SYNTHROID) 50 MCG tablet [Pharmacy Med Name: Levothyroxine Sodium 50 MCG Oral Tablet] 60 tablet 5    Sig: TAKE 1 TABLET BY MOUTH DAILY  BEFORE BREAKFAST     Endocrinology:  Hypothyroid Agents Passed - 04/16/2022 10:45 PM       Passed - TSH in normal range and within 360 days    TSH  Date Value Ref Range Status  10/19/2021 2.260 0.450 - 4.500 uIU/mL Final         Passed - Valid encounter within last 12 months    Recent Outpatient Visits           4 months ago Pneumonia of left lower lobe due to infectious organism   [place], Erin E, PA-C   5 months ago Pneumonia of left lower lobe due to infectious organism   [place], Megan P, DO   5 months ago Moderate persistent asthma with acute exacerbation   [place] [facility] Mecum, Erin E, PA-C   6 months ago Stage 3 chronic kidney disease, unspecified whether stage 3a or 3b CKD ([place])   [place], NP   9 months ago Bone spur of left foot   [place], [place], DO       Future  Appointments             In 3 weeks Jon Billings, NP [place], [place]

## 2022-04-19 ENCOUNTER — Ambulatory Visit: Payer: Medicare Other | Admitting: Nurse Practitioner

## 2022-04-19 DIAGNOSIS — M25572 Pain in left ankle and joints of left foot: Secondary | ICD-10-CM | POA: Diagnosis not present

## 2022-04-19 DIAGNOSIS — N183 Chronic kidney disease, stage 3 unspecified: Secondary | ICD-10-CM | POA: Diagnosis not present

## 2022-04-19 DIAGNOSIS — N1831 Chronic kidney disease, stage 3a: Secondary | ICD-10-CM | POA: Diagnosis not present

## 2022-04-19 DIAGNOSIS — Z9181 History of falling: Secondary | ICD-10-CM | POA: Diagnosis not present

## 2022-04-19 DIAGNOSIS — R030 Elevated blood-pressure reading, without diagnosis of hypertension: Secondary | ICD-10-CM | POA: Diagnosis not present

## 2022-04-19 DIAGNOSIS — J449 Chronic obstructive pulmonary disease, unspecified: Secondary | ICD-10-CM | POA: Diagnosis not present

## 2022-04-19 DIAGNOSIS — E559 Vitamin D deficiency, unspecified: Secondary | ICD-10-CM | POA: Diagnosis not present

## 2022-04-19 DIAGNOSIS — Z79899 Other long term (current) drug therapy: Secondary | ICD-10-CM | POA: Diagnosis not present

## 2022-04-19 DIAGNOSIS — G894 Chronic pain syndrome: Secondary | ICD-10-CM | POA: Diagnosis not present

## 2022-04-19 DIAGNOSIS — Z79891 Long term (current) use of opiate analgesic: Secondary | ICD-10-CM | POA: Diagnosis not present

## 2022-04-25 ENCOUNTER — Other Ambulatory Visit: Payer: Self-pay | Admitting: Nurse Practitioner

## 2022-04-26 NOTE — Telephone Encounter (Signed)
Requested Prescriptions  Pending Prescriptions Disp Refills   FLUoxetine (PROZAC) 20 MG capsule [Pharmacy Med Name: FLUoxetine HCl 20 MG Oral Capsule] 90 capsule 3    Sig: TAKE 1 CAPSULE BY MOUTH DAILY  WITH A 40 MG CAPSULE FOR TOTAL  60 MG DAILY     Psychiatry:  Antidepressants - SSRI Passed - 04/25/2022 10:33 AM      Passed - Completed PHQ-2 or PHQ-9 in the last 360 days      Passed - Valid encounter within last 6 months    Recent Outpatient Visits           5 months ago Pneumonia of left lower lobe due to infectious organism   [place] [facility] Mecum, Erin E, PA-C   5 months ago Pneumonia of left lower lobe due to infectious organism   [place] P, DO   5 months ago Moderate persistent asthma with acute exacerbation   [place] [facility] Mecum, Erin E, PA-C   6 months ago Stage 3 chronic kidney disease, unspecified whether stage 3a or 3b CKD ([place])   [place] Jon Billings, NP   9 months ago Bone spur of left foot   [place], Barb Merino, DO       Future Appointments             In 1 week Jon Billings, NP [place], [facility]

## 2022-04-26 NOTE — Telephone Encounter (Signed)
Requested medication (s) are due for refill today:   All 5 due in May  Requested medication (s) are on the active medication list:   Yes for all 5  Future visit scheduled:   Yes 05/09/2022 with Jon Billings, NP   Last ordered: Seroquel XR 05/25/2021 #30, 11 refills - non delegated/CBC due;   Synthroid 05/24/2021 #30, 11 refills;   Atarax 01/07/2022 #90, 0 refills;   Ventolin inhaler 07/13/2021 17 g, 2 refills;   Zocor 05/24/2021 #30, 11 refills  Returned because labs due and Seroquel is non delegated.   Provider to review for refills prior to upcoming appt.   Requested Prescriptions  Pending Prescriptions Disp Refills   QUEtiapine Fumarate (SEROQUEL XR) 150 MG 24 hr tablet [Pharmacy Med Name: QUETIAPINE  150MG   TAB  XR] 30 tablet 11    Sig: TAKE 1 TABLET BY MOUTH AT  BEDTIME     Not Delegated - Psychiatry:  Antipsychotics - Second Generation (Atypical) - quetiapine Failed - 04/25/2022 10:33 AM      Failed - This refill cannot be delegated      Failed - Lipid Panel in normal range within the last 12 months    Cholesterol, Total  Date Value Ref Range Status  10/19/2021 162 100 - 199 mg/dL Final   LDL Chol Calc (NIH)  Date Value Ref Range Status  10/19/2021 92 0 - 99 mg/dL Final   HDL  Date Value Ref Range Status  10/19/2021 39 (L) >39 mg/dL Final   Triglycerides  Date Value Ref Range Status  10/19/2021 181 (H) 0 - 149 mg/dL Final         Failed - CBC within normal limits and completed in the last 12 months    WBC  Date Value Ref Range Status  03/22/2021 6.5  Final  09/18/2017 8.8 3.6 - 11.0 K/uL Final   RBC  Date Value Ref Range Status  03/22/2021 4.1 3.87 - 5.11 Final   Hemoglobin  Date Value Ref Range Status  03/22/2021 11.4 (A) 12.0 - 16.0 Final  02/26/2018 13.6 11.1 - 15.9 g/dL Final   HCT  Date Value Ref Range Status  03/22/2021 35 (A) 36 - 46 Final   Hematocrit  Date Value Ref Range Status  02/26/2018 40.8 34.0 - 46.6 % Final   MCHC  Date Value Ref Range  Status  02/26/2018 33.3 31.5 - 35.7 g/dL Final  09/18/2017 34.2 32.0 - 36.0 g/dL Final   [facility]  Date Value Ref Range Status  02/26/2018 30.6 26.6 - 33.0 pg Final  09/18/2017 31.6 26.0 - 34.0 pg Final   MCV  Date Value Ref Range Status  02/26/2018 92 79 - 97 fL Final   No results found for: "PLTCOUNTKUC", "LABPLAT", "POCPLA" RDW  Date Value Ref Range Status  02/26/2018 14.3 11.7 - 15.4 % Final         Passed - TSH in normal range and within 360 days    TSH  Date Value Ref Range Status  10/19/2021 2.260 0.450 - 4.500 uIU/mL Final         Passed - Completed PHQ-2 or PHQ-9 in the last 360 days      Passed - Last BP in normal range    BP Readings from Last 1 Encounters:  11/26/21 110/70         Passed - Last Heart Rate in normal range    Pulse Readings from Last 1 Encounters:  11/26/21 (!) 59  Passed - Valid encounter within last 6 months    Recent Outpatient Visits           5 months ago Pneumonia of left lower lobe due to infectious organism   [place] [facility] Mecum, Erin E, PA-C   5 months ago Pneumonia of left lower lobe due to infectious organism   [place], Megan P, DO   5 months ago Moderate persistent asthma with acute exacerbation   [place] [facility] Mecum, Erin E, PA-C   6 months ago Stage 3 chronic kidney disease, unspecified whether stage 3a or 3b CKD ([place])   [place] Jon Billings, NP   9 months ago Bone spur of left foot   [place], [place], DO       Future Appointments             In 1 week Jon Billings, NP [place], [place] within normal limits and completed in the last 12 months    Albumin  Date Value Ref Range Status  10/19/2021 4.3 3.9 - 4.9 g/dL Final   Alkaline Phosphatase  Date Value Ref Range Status  10/19/2021 82 44 - 121 IU/L  Final   ALT  Date Value Ref Range Status  10/19/2021 24 0 - 32 IU/L Final   AST  Date Value Ref Range Status  10/19/2021 20 0 - 40 IU/L Final   BUN  Date Value Ref Range Status  10/19/2021 11 8 - 27 mg/dL Final   Calcium  Date Value Ref Range Status  10/19/2021 9.3 8.7 - 10.3 mg/dL Final   CO2  Date Value Ref Range Status  10/19/2021 22 20 - 29 mmol/L Final   Creatinine, Ser  Date Value Ref Range Status  10/19/2021 1.05 (H) 0.57 - 1.00 mg/dL Final   Glucose  Date Value Ref Range Status  10/19/2021 117 (H) 70 - 99 mg/dL Final  03/22/2021 112  Final   Glucose, Bld  Date Value Ref Range Status  09/18/2017 99 70 - 99 mg/dL Final   Potassium  Date Value Ref Range Status  10/19/2021 4.0 3.5 - 5.2 mmol/L Final   Sodium  Date Value Ref Range Status  10/19/2021 139 134 - 144 mmol/L Final   Bilirubin Total  Date Value Ref Range Status  10/19/2021 0.8 0.0 - 1.2 mg/dL Final   Bilirubin, Direct  Date Value Ref Range Status  09/18/2017 0.1 0.0 - 0.2 mg/dL Final   Indirect Bilirubin  Date Value Ref Range Status  09/18/2017 0.7 0.3 - 0.9 mg/dL Final    Comment:    Performed at [facility], [address] [place], [place] [address]   [place]  Date Value Ref Range Status  05/15/2020 Negative Negative/Trace Final   Total Protein  Date Value Ref Range Status  10/19/2021 6.5 6.0 - 8.5 g/dL Final   GFR calc Af Amer  Date Value Ref Range Status  07/15/2019 59 (L) >59 mL/min/1.73 Final    Comment:    **[facility] currently reports eGFR in compliance with the current**   recommendations of the [employer]. [facility] will   update reporting as new guidelines are published from the NKF-ASN   Task force.    eGFR  Date Value Ref Range Status  10/19/2021 59 (L) >59 mL/min/1.73 Final  GFR calc non Af Amer  Date Value Ref Range Status  07/15/2019 51 (L) >59 mL/min/1.73 Final          levothyroxine (SYNTHROID) 50 MCG tablet [Pharmacy Med  Name: Levothyroxine Sodium 50 MCG Oral Tablet] 60 tablet 5    Sig: TAKE 1 TABLET BY MOUTH DAILY  BEFORE BREAKFAST     Endocrinology:  Hypothyroid Agents Passed - 04/25/2022 10:33 AM      Passed - TSH in normal range and within 360 days    TSH  Date Value Ref Range Status  10/19/2021 2.260 0.450 - 4.500 uIU/mL Final         Passed - Valid encounter within last 12 months    Recent Outpatient Visits           5 months ago Pneumonia of left lower lobe due to infectious organism   [place] [facility] Mecum, Erin E, PA-C   5 months ago Pneumonia of left lower lobe due to infectious organism   [place], Megan P, DO   5 months ago Moderate persistent asthma with acute exacerbation   [place] [facility] Mecum, Erin E, PA-C   6 months ago Stage 3 chronic kidney disease, unspecified whether stage 3a or 3b CKD ([place])   [place] Jon Billings, NP   9 months ago Bone spur of left foot   [place], [place], DO       Future Appointments             In 1 week Jon Billings, NP [place], [facility]             hydrOXYzine (ATARAX) 25 MG tablet [Pharmacy Med Name: hydrOXYzine HCl 25 MG Oral Tablet] 90 tablet 0    Sig: TAKE 1 TABLET BY MOUTH AT  BEDTIME AS NEEDED     Ear, Nose, and Throat:  Antihistamines 2 Failed - 04/25/2022 10:33 AM      Failed - Cr in normal range and within 360 days    Creatinine, Ser  Date Value Ref Range Status  10/19/2021 1.05 (H) 0.57 - 1.00 mg/dL Final         Passed - Valid encounter within last 12 months    Recent Outpatient Visits           5 months ago Pneumonia of left lower lobe due to infectious organism   [place], Erin E, PA-C   5 months ago Pneumonia of left lower lobe due to infectious organism   [place], Megan P, DO    5 months ago Moderate persistent asthma with acute exacerbation   [place] [facility] Mecum, Erin E, PA-C   6 months ago Stage 3 chronic kidney disease, unspecified whether stage 3a or 3b CKD ([place])   [place] Jon Billings, NP   9 months ago Bone spur of left foot   [place], Barb Merino, DO       Future Appointments             In 1 week Jon Billings, NP [place], [facility]             albuterol (VENTOLIN HFA) 108 (90 Base) MCG/ACT inhaler [Pharmacy Med Name: ALBUTEROL HFA 90MCG/ACT (PA)] 17 g 2    Sig: USE  1 INHALATION BY MOUTH  EVERY 6 HOURS AS NEEDED     Pulmonology:  Beta Agonists 2 Passed - 04/25/2022 10:33 AM      Passed - Last BP in normal range    BP Readings from Last 1 Encounters:  11/26/21 110/70         Passed - Last Heart Rate in normal range    Pulse Readings from Last 1 Encounters:  11/26/21 (!) 56         Passed - Valid encounter within last 12 months    Recent Outpatient Visits           5 months ago Pneumonia of left lower lobe due to infectious organism   [place] [facility] Mecum, Erin E, PA-C   5 months ago Pneumonia of left lower lobe due to infectious organism   [place], Megan P, DO   5 months ago Moderate persistent asthma with acute exacerbation   [place] [facility] Mecum, Erin E, PA-C   6 months ago Stage 3 chronic kidney disease, unspecified whether stage 3a or 3b CKD ([place])   [place] Jon Billings, NP   9 months ago Bone spur of left foot   [place], [place], DO       Future Appointments             In 1 week Jon Billings, NP [place], [facility]             simvastatin (ZOCOR) 40 MG tablet [Pharmacy Med Name: Simvastatin 40 MG Oral Tablet] 60 tablet 5    Sig:  TAKE 1 TABLET BY MOUTH DAILY     Cardiovascular:  Antilipid - Statins Failed - 04/25/2022 10:33 AM      Failed - Lipid Panel in normal range within the last 12 months    Cholesterol, Total  Date Value Ref Range Status  10/19/2021 162 100 - 199 mg/dL Final   LDL Chol Calc (NIH)  Date Value Ref Range Status  10/19/2021 92 0 - 99 mg/dL Final   HDL  Date Value Ref Range Status  10/19/2021 39 (L) >39 mg/dL Final   Triglycerides  Date Value Ref Range Status  10/19/2021 181 (H) 0 - 149 mg/dL Final         Passed - Patient is not pregnant      Passed - Valid encounter within last 12 months    Recent Outpatient Visits           5 months ago Pneumonia of left lower lobe due to infectious organism   [place], Erin E, PA-C   5 months ago Pneumonia of left lower lobe due to infectious organism   [place], Megan P, DO   5 months ago Moderate persistent asthma with acute exacerbation   [place] [facility] Mecum, Erin E, PA-C   6 months ago Stage 3 chronic kidney disease, unspecified whether stage 3a or 3b CKD ([place])   [place] Jon Billings, NP   9 months ago Bone spur of left foot   [place], Barb Merino, DO       Future Appointments             In 1 week Jon Billings, NP [place], [facility]

## 2022-04-27 NOTE — Telephone Encounter (Addendum)
Call to patient- she states the only medication that she really needs now is hydroxyzine ans she has been notified that the office is going to fill that. Patient states she is good until her appointment with all others.

## 2022-04-27 NOTE — Telephone Encounter (Signed)
Pt is calling to f/u on medication refills. Pt stated she will be out of medication hydrOXYzine (ATARAX) 25 MG tablet in two weeks. She gets it mailed and needs this filled.   Pt asked if it was possible to get a courtesy refill to get her through until her appointment. Please advise.   [place], [place]  [address] [place] [address]  Phone: [phone] Fax: [phone]  Hours: Not open 24 hours

## 2022-05-04 ENCOUNTER — Encounter: Payer: Self-pay | Admitting: Nurse Practitioner

## 2022-05-04 ENCOUNTER — Ambulatory Visit (INDEPENDENT_AMBULATORY_CARE_PROVIDER_SITE_OTHER): Payer: Medicare Other | Admitting: Nurse Practitioner

## 2022-05-04 VITALS — BP 105/67 | HR 60 | Temp 97.5°F | Ht 67.99 in | Wt 234.7 lb

## 2022-05-04 DIAGNOSIS — E038 Other specified hypothyroidism: Secondary | ICD-10-CM | POA: Diagnosis not present

## 2022-05-04 DIAGNOSIS — Z0000 Encounter for general adult medical examination without abnormal findings: Secondary | ICD-10-CM | POA: Diagnosis not present

## 2022-05-04 DIAGNOSIS — J4541 Moderate persistent asthma with (acute) exacerbation: Secondary | ICD-10-CM

## 2022-05-04 DIAGNOSIS — I10 Essential (primary) hypertension: Secondary | ICD-10-CM

## 2022-05-04 DIAGNOSIS — N183 Chronic kidney disease, stage 3 unspecified: Secondary | ICD-10-CM

## 2022-05-04 DIAGNOSIS — R7301 Impaired fasting glucose: Secondary | ICD-10-CM | POA: Diagnosis not present

## 2022-05-04 DIAGNOSIS — E785 Hyperlipidemia, unspecified: Secondary | ICD-10-CM

## 2022-05-04 DIAGNOSIS — F333 Major depressive disorder, recurrent, severe with psychotic symptoms: Secondary | ICD-10-CM

## 2022-05-04 LAB — URINALYSIS, ROUTINE W REFLEX MICROSCOPIC
Bilirubin, UA: NEGATIVE
Glucose, UA: NEGATIVE
Ketones, UA: NEGATIVE
Leukocytes,UA: NEGATIVE
Nitrite, UA: NEGATIVE
Protein,UA: NEGATIVE
RBC, UA: NEGATIVE
Specific Gravity, UA: <1.005 — ABNORMAL LOW (ref 1.005–1.030)
Urobilinogen, Ur: 0.2 mg/dL (ref 0.2–1.0)
pH, UA: 5.5 (ref 5.0–7.5)

## 2022-05-04 MED ORDER — QUETIAPINE FUMARATE ER 150 MG PO TB24: 150.0000 mg | ORAL_TABLET | Freq: Every day | ORAL | 1 refills | Status: DC

## 2022-05-04 MED ORDER — SIMVASTATIN 40 MG PO TABS: 40.0000 mg | ORAL_TABLET | Freq: Every day | ORAL | 1 refills | Status: DC

## 2022-05-04 MED ORDER — CLONIDINE HCL 0.2 MG PO TABS: 0.2000 mg | ORAL_TABLET | Freq: Three times a day (TID) | ORAL | 1 refills | Status: DC

## 2022-05-04 MED ORDER — ALBUTEROL SULFATE HFA 108 (90 BASE) MCG/ACT IN AERS: INHALATION_SPRAY | RESPIRATORY_TRACT | 2 refills | Status: DC

## 2022-05-04 MED ORDER — FLUOXETINE HCL 20 MG PO CAPS: ORAL_CAPSULE | ORAL | 1 refills | Status: DC

## 2022-05-04 MED ORDER — BUDESONIDE-FORMOTEROL FUMARATE 160-4.5 MCG/ACT IN AERO
2.0000 | INHALATION_SPRAY | Freq: Two times a day (BID) | RESPIRATORY_TRACT | 12 refills | Status: DC
Start: 2022-05-04 — End: 2023-02-23

## 2022-05-04 MED ORDER — LEVOTHYROXINE SODIUM 50 MCG PO TABS: 50.0000 ug | ORAL_TABLET | Freq: Every day | ORAL | 1 refills | Status: DC

## 2022-05-04 MED ORDER — FLUOXETINE HCL 40 MG PO CAPS: 40.0000 mg | ORAL_CAPSULE | Freq: Every day | ORAL | 1 refills | Status: DC

## 2022-05-04 NOTE — Assessment & Plan Note (Signed)
Labs ordered at visit today.  Will make recommendations based on lab results.   

## 2022-05-04 NOTE — Assessment & Plan Note (Signed)
Chronic.  Controlled.  Continue with current medication regimen of Simvastatin. Refills sent today.   Labs ordered today.  Return to clinic in 6 months for reevaluation.  Call sooner if concerns arise.

## 2022-05-04 NOTE — Progress Notes (Signed)
BP 105/67   Pulse 60   Temp (!) 97.5 F (36.4 C) (Oral)   Ht 5' 7.99" (1.727 m)   Wt 234 lb 11.2 oz (106.5 kg)   SpO2 97%   BMI 35.69 kg/m    Subjective:    Patient ID: Kayla Wright, female    DOB: 1955/02/16, 67 y.o.   MRN: [id number]  HPI: [name] is a 67 y.o. female presenting on 05/04/2022 for comprehensive medical examination. Current medical complaints include:none  She currently lives with: Menopausal Symptoms: no  HYPERTENSION / HYPERLIPIDEMIA Satisfied with current treatment? yes Duration of hypertension: years BP monitoring frequency: some BP range: 100/60 BP medication side effects: no Past BP meds: clonidine Duration of hyperlipidemia: years Cholesterol medication side effects: no Cholesterol supplements: none Past cholesterol medications: simvastatin (zocor) Medication compliance: excellent compliance Aspirin: no Recent stressors: no Recurrent headaches: no Visual changes: no Palpitations: no Dyspnea: no Chest pain: no Lower extremity edema: no Dizzy/lightheaded: no  HYPOTHYROIDISM Thyroid control status:controlled Satisfied with current treatment? no Medication side effects: no Medication compliance: excellent compliance Etiology of hypothyroidism:  Recent dose adjustment:no Fatigue: yes Cold intolerance: no Heat intolerance: no Weight gain: no Weight loss: no Constipation: no Diarrhea/loose stools: no Palpitations: no Lower extremity edema: no Anxiety/depressed mood: no  CHRONIC KIDNEY DISEASE CKD status: controlled Medications renally dose: yes Previous renal evaluation: no Pneumovax:  Up to Date Influenza Vaccine:  Up to Date  DEPRESSION/ANXIETY She does have some down feelings but feels like the prozac is still working for her.  Denies concerns at visit today.     Depression Screen done today and results listed below:     05/04/2022   11:01 AM 03/07/2022    9:14 AM 11/01/2021    2:33 PM 10/19/2021    2:54 PM 07/13/2021     2:30 PM  Depression screen PHQ 2/9  Decreased Interest 2 0 3 2 2   Down, Depressed, Hopeless 1 0 3 3 1   PHQ - 2 Score 3 0 6 5 3   Altered sleeping 3 0 2 2 3   Tired, decreased energy 3 0 3 2 2   Change in appetite 2 0 3 2 0  Feeling bad or failure about yourself  1 0 2 2 2   Trouble concentrating 2 0 3 3 2   Moving slowly or fidgety/restless 1 0 2 2 1   Suicidal thoughts 0 0 1 0 0  PHQ-9 Score 15 0 22 18 13   Difficult doing work/chores Somewhat difficult Not difficult at all Very difficult Somewhat difficult Not difficult at all    The patient does not have a history of falls. I did complete a risk assessment for falls. A plan of care for falls was documented.   Past Medical History:  Past Medical History:  Diagnosis Date   Allergy    Common migraine    Depression    GERD (gastroesophageal reflux disease)    Hyperlipidemia    Hypertension    Insomnia    Menopausal symptom    Osteopenia    Thyroid disease     Surgical History:  Past Surgical History:  Procedure Laterality Date   ANKLE FRACTURE SURGERY  06/18/15   COLONOSCOPY WITH PROPOFOL N/A 08/31/2017   Procedure: COLONOSCOPY WITH PROPOFOL;  Surgeon: Toney Reil, MD;  Location: [facility] ENDOSCOPY;  Service: Gastroenterology;  Laterality: N/A;   TUBAL LIGATION      Medications:  Current Outpatient Medications on File Prior to Visit  Medication Sig   Ascorbic  Acid (VITAMIN C) 1000 MG tablet Take 1 tablet (1,000 mg total) by mouth daily.   Cholecalciferol (VITAMIN D3) 125 MCG (5000 UT) CAPS Take 1 capsule (5,000 Units total) by mouth daily.   Cyanocobalamin (VITAMIN B-12 PO) Take 1 tablet by mouth daily.   hydrOXYzine (ATARAX) 25 MG tablet TAKE 1 TABLET BY MOUTH AT  BEDTIME AS NEEDED   nystatin cream (MYCOSTATIN) Apply 1 Application topically 2 (two) times daily.   oxyCODONE (OXY IR/ROXICODONE) 5 MG immediate release tablet Take 5 mg by mouth 4 (four) times daily as needed.   oxyCODONE-acetaminophen (PERCOCET/ROXICET)  5-325 MG tablet Take 1 tablet by mouth 4 (four) times daily as needed.   No current facility-administered medications on file prior to visit.    Allergies:  No Known Allergies  Social History:  Social History   Socioeconomic History   Marital status: Divorced    Spouse name: Not on file   Number of children: Not on file   Years of education: Not on file   Highest education level: Not on file  Occupational History   Occupation: retired  Tobacco Use   Smoking status: Never   Smokeless tobacco: Never  Vaping Use   Vaping Use: Never used  Substance and Sexual Activity   Alcohol use: No    Alcohol/week: 0.0 standard drinks of alcohol   Drug use: Yes    Types: Oxycodone   Sexual activity: Not Currently    Birth control/protection: Surgical  Other Topics Concern   Not on file  Social History Narrative   Not on file   Social Determinants of Health   Financial Resource Strain: Low Risk  (03/07/2022)   Overall Financial Resource Strain (CARDIA)    Difficulty of Paying Living Expenses: Not hard at all  Food Insecurity: No Food Insecurity (03/07/2022)   Hunger Vital Sign    Worried About Running Out of Food in the Last Year: Never true    Ran Out of Food in the Last Year: Never true  Transportation Needs: No Transportation Needs (03/07/2022)   PRAPARE - Administrator, Civil Service (Medical): No    Lack of Transportation (Non-Medical): No  Physical Activity: Insufficiently Active (03/07/2022)   Exercise Vital Sign    Days of Exercise per Week: 2 days    Minutes of Exercise per Session: 20 min  Stress: No Stress Concern Present (03/07/2022)   [employer] of Occupational Health - Occupational Stress Questionnaire    Feeling of Stress : Not at all  Social Connections: Moderately Isolated (03/07/2022)   Social Connection and Isolation Panel [NHANES]    Frequency of Communication with Friends and Family: More than three times a week    Frequency of Social  Gatherings with Friends and Family: Twice a week    Attends Religious Services: More than 4 times per year    Active Member of [employer] or Organizations: No    Attends Banker Meetings: Never    Marital Status: Widowed  Intimate Partner Violence: Not At Risk (03/07/2022)   Humiliation, Afraid, Rape, and Kick questionnaire    Fear of Current or Ex-Partner: No    Emotionally Abused: No    Physically Abused: No    Sexually Abused: No   Social History   Tobacco Use  Smoking Status Never  Smokeless Tobacco Never   Social History   Substance and Sexual Activity  Alcohol Use No   Alcohol/week: 0.0 standard drinks of alcohol    Family History:  Family History  Problem Relation Age of Onset   Diabetes Mother    Heart disease Mother    Cirrhosis Mother    Heart disease Father     Past medical history, surgical history, medications, allergies, family history and social history reviewed with patient today and changes made to appropriate areas of the chart.   Review of Systems  Constitutional:  Positive for malaise/fatigue. Negative for weight loss.  Eyes:  Negative for blurred vision and double vision.  Respiratory:  Negative for shortness of breath.   Cardiovascular:  Negative for chest pain, palpitations and leg swelling.  Gastrointestinal:  Negative for constipation and diarrhea.  Neurological:  Negative for dizziness and headaches.  Psychiatric/Behavioral:  Positive for depression. Negative for suicidal ideas. The patient is nervous/anxious.    All other ROS negative except what is listed above and in the HPI.      Objective:    BP 105/67   Pulse 60   Temp (!) 97.5 F (36.4 C) (Oral)   Ht 5' 7.99" (1.727 m)   Wt 234 lb 11.2 oz (106.5 kg)   SpO2 97%   BMI 35.69 kg/m   Wt Readings from Last 3 Encounters:  05/04/22 234 lb 11.2 oz (106.5 kg)  03/07/22 234 lb (106.1 kg)  11/26/21 234 lb 6.4 oz (106.3 kg)    Physical Exam Vitals and nursing note reviewed.  Exam conducted with a chaperone present Wilhemena Durie, CMA).  Constitutional:      General: She is awake. She is not in acute distress.    Appearance: Normal appearance. She is well-developed. She is obese. She is not ill-appearing.  HENT:     Head: Normocephalic and atraumatic.     Right Ear: Hearing, tympanic membrane, ear canal and external ear normal. No drainage.     Left Ear: Hearing, tympanic membrane, ear canal and external ear normal. No drainage.     Nose: Nose normal.     Right Sinus: No maxillary sinus tenderness or frontal sinus tenderness.     Left Sinus: No maxillary sinus tenderness or frontal sinus tenderness.     Mouth/Throat:     Mouth: Mucous membranes are moist.     Pharynx: Oropharynx is clear. Uvula midline. No pharyngeal swelling, oropharyngeal exudate or posterior oropharyngeal erythema.  Eyes:     General: Lids are normal.        Right eye: No discharge.        Left eye: No discharge.     Extraocular Movements: Extraocular movements intact.     Conjunctiva/sclera: Conjunctivae normal.     Pupils: Pupils are equal, round, and reactive to light.     Visual Fields: Right eye visual fields normal and left eye visual fields normal.  Neck:     Thyroid: No thyromegaly.     Vascular: No carotid bruit.     Trachea: Trachea normal.  Cardiovascular:     Rate and Rhythm: Normal rate and regular rhythm.     Heart sounds: Normal heart sounds. No murmur heard.    No gallop.  Pulmonary:     Effort: Pulmonary effort is normal. No accessory muscle usage or respiratory distress.     Breath sounds: Normal breath sounds.  Chest:  Breasts:    Right: Normal.     Left: Normal.  Abdominal:     General: Bowel sounds are normal.     Palpations: Abdomen is soft. There is no hepatomegaly or splenomegaly.     Tenderness: There is  no abdominal tenderness.  Genitourinary:    Vagina: Normal.     Cervix: Normal.     Adnexa: Right adnexa normal and left adnexa normal.   Musculoskeletal:        General: Normal range of motion.     Cervical back: Normal range of motion and neck supple.     Right lower leg: No edema.     Left lower leg: No edema.  Lymphadenopathy:     Head:     Right side of head: No submental, submandibular, tonsillar, preauricular or posterior auricular adenopathy.     Left side of head: No submental, submandibular, tonsillar, preauricular or posterior auricular adenopathy.     Cervical: No cervical adenopathy.     Upper Body:     Right upper body: No supraclavicular, axillary or pectoral adenopathy.     Left upper body: No supraclavicular, axillary or pectoral adenopathy.  Skin:    General: Skin is warm and dry.     Capillary Refill: Capillary refill takes less than 2 seconds.     Findings: No rash.  Neurological:     Mental Status: She is alert and oriented to person, place, and time.     Gait: Gait is intact.     Deep Tendon Reflexes: Reflexes are normal and symmetric.     Reflex Scores:      Brachioradialis reflexes are 2+ on the right side and 2+ on the left side.      Patellar reflexes are 2+ on the right side and 2+ on the left side. Psychiatric:        Attention and Perception: Attention normal.        Mood and Affect: Mood normal.        Speech: Speech normal.        Behavior: Behavior normal. Behavior is cooperative.        Thought Content: Thought content normal.        Judgment: Judgment normal.     Results for orders placed or performed in visit on 10/19/21  Comp Met (CMET)  Result Value Ref Range   Glucose 117 (H) 70 - 99 mg/dL   BUN 11 8 - 27 mg/dL   Creatinine, Ser 1.61 (H) 0.57 - 1.00 mg/dL   eGFR 59 (L) >09 UE/AVW/0.98   BUN/Creatinine Ratio 10 (L) 12 - 28   Sodium 139 134 - 144 mmol/L   Potassium 4.0 3.5 - 5.2 mmol/L   Chloride 102 96 - 106 mmol/L   CO2 22 20 - 29 mmol/L   Calcium 9.3 8.7 - 10.3 mg/dL   Total Protein 6.5 6.0 - 8.5 g/dL   Albumin 4.3 3.9 - 4.9 g/dL   Globulin, Total 2.2 1.5 - 4.5  g/dL   Albumin/Globulin Ratio 2.0 1.2 - 2.2   Bilirubin Total 0.8 0.0 - 1.2 mg/dL   Alkaline Phosphatase 82 44 - 121 IU/L   AST 20 0 - 40 IU/L   ALT 24 0 - 32 IU/L  TSH  Result Value Ref Range   TSH 2.260 0.450 - 4.500 uIU/mL  T4, free  Result Value Ref Range   Free T4 0.99 0.82 - 1.77 ng/dL  Lipid Profile  Result Value Ref Range   Cholesterol, Total 162 100 - 199 mg/dL   Triglycerides 119 (H) 0 - 149 mg/dL   HDL 39 (L) >14 mg/dL   VLDL Cholesterol Cal 31 5 - 40 mg/dL   LDL Chol Calc (NIH) 92 0 - 99 mg/dL  Chol/HDL Ratio 4.2 0.0 - 4.4 ratio  HgB A1c  Result Value Ref Range   Hgb A1c MFr Bld 6.5 (H) 4.8 - 5.6 %   Est. average glucose Bld gHb Est-mCnc 140 mg/dL      Assessment & Plan:   Problem List Items Addressed This Visit       Cardiovascular and Mediastinum   Essential hypertension - Primary    Chronic.  Controlled.  Continue with current medication regimen of Clonidine.  Refills sent today.  Labs ordered today.  Return to clinic in 6 months for reevaluation.  Call sooner if concerns arise.        Relevant Medications   simvastatin (ZOCOR) 40 MG tablet   cloNIDine (CATAPRES) 0.2 MG tablet     Respiratory   Asthma    Chronic.  Controlled.  Continue with current medication regimen of Symbicort and PRN albuterol.  Refills sent today.  Labs ordered today.  Return to clinic in 6 months for reevaluation.  Call sooner if concerns arise.        Relevant Medications   budesonide-formoterol (SYMBICORT) 160-4.5 MCG/ACT inhaler   albuterol (VENTOLIN HFA) 108 (90 Base) MCG/ACT inhaler     Endocrine   Hypothyroidism    Chronic.  Controlled.  Continue with current medication regimen of Levothyroxine 50mcg daily.  Refills sent today.  Labs ordered today.  Return to clinic in 6 months for reevaluation.  Call sooner if concerns arise.        Relevant Medications   levothyroxine (SYNTHROID) 50 MCG tablet   Other Relevant Orders   TSH   T4, free   IFG (impaired fasting  glucose)    Labs ordered at visit today.  Will make recommendations based on lab results.        Relevant Orders   HgB A1c     Genitourinary   CKD (chronic kidney disease) stage 3, GFR 30-59 ml/min    Labs ordered at visit today.  Will make recommendations based on lab results.          Other   Hyperlipidemia    Chronic.  Controlled.  Continue with current medication regimen of Simvastatin. Refills sent today.   Labs ordered today.  Return to clinic in 6 months for reevaluation.  Call sooner if concerns arise.        Relevant Medications   simvastatin (ZOCOR) 40 MG tablet   cloNIDine (CATAPRES) 0.2 MG tablet   Other Relevant Orders   Lipid panel   Morbid obesity    BMI of 35 with HTN and CKD stage 3.      MDD (major depressive disorder), recurrent, severe, with psychosis    Chronic.  Controlled.  Continue with current medication regimen of Prozac and Seroquel.  Refills sent today.  Labs ordered today.  Return to clinic in 6 months for reevaluation.  Call sooner if concerns arise.        Relevant Medications   FLUoxetine (PROZAC) 20 MG capsule   FLUoxetine (PROZAC) 40 MG capsule   Other Visit Diagnoses     Annual physical exam       Relevant Orders   CBC with Differential/Platelet   Comprehensive metabolic panel   Lipid panel   TSH   Urinalysis, Routine w reflex microscopic   HgB A1c   T4, free        Follow up plan: Return in about 6 months (around 11/03/2022) for HTN, HLD, DM2 FU.   LABORATORY TESTING:  - Pap smear:  up to date  IMMUNIZATIONS:   - Tdap: Tetanus vaccination status reviewed: last tetanus booster within 10 years. - Influenza: Up to date - Pneumovax: up to date - Prevnar: Not applicable - HPV: Not applicable - Zostavax vaccine: up to date  SCREENING: -Mammogram: up to date - Colonoscopy: Up to date  - Bone Density: up to date -Hearing Test: Not applicable  -Spirometry: Not applicable   PATIENT COUNSELING:   Advised to take 1 mg  of folate supplement per day if capable of pregnancy.   Sexuality: Discussed sexually transmitted diseases, partner selection, use of condoms, avoidance of unintended pregnancy  and contraceptive alternatives.   Advised to avoid cigarette smoking.  I discussed with the patient that most people either abstain from alcohol or drink within safe limits (<=14/week and <=4 drinks/occasion for males, <=7/weeks and <= 3 drinks/occasion for females) and that the risk for alcohol disorders and other health effects rises proportionally with the number of drinks per week and how often a drinker exceeds daily limits.  Discussed cessation/primary prevention of drug use and availability of treatment for abuse.   Diet: Encouraged to adjust caloric intake to maintain  or achieve ideal body weight, to reduce intake of dietary saturated fat and total fat, to limit sodium intake by avoiding high sodium foods and not adding table salt, and to maintain adequate dietary potassium and calcium preferably from fresh fruits, vegetables, and low-fat dairy products.    stressed the importance of regular exercise  Injury prevention: Discussed safety belts, safety helmets, smoke detector, smoking near bedding or upholstery.   Dental health: Discussed importance of regular tooth brushing, flossing, and dental visits.    NEXT PREVENTATIVE PHYSICAL DUE IN 1 YEAR. Return in about 6 months (around 11/03/2022) for HTN, HLD, DM2 FU.

## 2022-05-04 NOTE — Assessment & Plan Note (Signed)
Chronic.  Controlled.  Continue with current medication regimen of Levothyroxine daily.  Refills sent today.  Labs ordered today.  Return to clinic in 6 months for reevaluation.  Call sooner if concerns arise.

## 2022-05-04 NOTE — Assessment & Plan Note (Signed)
Chronic.  Controlled.  Continue with current medication regimen of Clonidine.  Refills sent today.  Labs ordered today.  Return to clinic in 6 months for reevaluation.  Call sooner if concerns arise.

## 2022-05-04 NOTE — Assessment & Plan Note (Signed)
Chronic.  Controlled.  Continue with current medication regimen of Prozac and Seroquel.  Refills sent today.  Labs ordered today.  Return to clinic in 6 months for reevaluation.  Call sooner if concerns arise.

## 2022-05-04 NOTE — Assessment & Plan Note (Signed)
Chronic.  Controlled.  Continue with current medication regimen of Symbicort and PRN albuterol.  Refills sent today.  Labs ordered today.  Return to clinic in 6 months for reevaluation.  Call sooner if concerns arise.

## 2022-05-04 NOTE — Assessment & Plan Note (Signed)
BMI of 35 with HTN and CKD stage 3.

## 2022-05-05 LAB — COMPREHENSIVE METABOLIC PANEL
ALT: 31 IU/L (ref 0–32)
AST: 31 IU/L (ref 0–40)
Albumin/Globulin Ratio: 1.7 (ref 1.2–2.2)
Albumin: 4.3 g/dL (ref 3.9–4.9)
Alkaline Phosphatase: 77 IU/L (ref 44–121)
BUN/Creatinine Ratio: 10 — ABNORMAL LOW (ref 12–28)
BUN: 11 mg/dL (ref 8–27)
Bilirubin Total: 0.7 mg/dL (ref 0.0–1.2)
CO2: 22 mmol/L (ref 20–29)
Calcium: 9.4 mg/dL (ref 8.7–10.3)
Chloride: 102 mmol/L (ref 96–106)
Creatinine, Ser: 1.05 mg/dL — ABNORMAL HIGH (ref 0.57–1.00)
Globulin, Total: 2.5 g/dL (ref 1.5–4.5)
Glucose: 95 mg/dL (ref 70–99)
Potassium: 4.3 mmol/L (ref 3.5–5.2)
Sodium: 139 mmol/L (ref 134–144)
Total Protein: 6.8 g/dL (ref 6.0–8.5)
eGFR: 59 mL/min/{1.73_m2} — ABNORMAL LOW (ref >59)

## 2022-05-05 LAB — CBC WITH DIFFERENTIAL/PLATELET
Basophils Absolute: 0.1 10*3/uL (ref 0.0–0.2)
Basos: 1 %
EOS (ABSOLUTE): 0.1 10*3/uL (ref 0.0–0.4)
Eos: 2 %
Hematocrit: 41.4 % (ref 34.0–46.6)
Hemoglobin: 13.7 g/dL (ref 11.1–15.9)
Immature Grans (Abs): 0 10*3/uL (ref 0.0–0.1)
Immature Granulocytes: 0 %
Lymphocytes Absolute: 2.7 10*3/uL (ref 0.7–3.1)
Lymphs: 38 %
MCH: 31.3 pg (ref 26.6–33.0)
MCHC: 33.1 g/dL (ref 31.5–35.7)
MCV: 95 fL (ref 79–97)
Monocytes Absolute: 0.5 10*3/uL (ref 0.1–0.9)
Monocytes: 7 %
Neutrophils Absolute: 3.7 10*3/uL (ref 1.4–7.0)
Neutrophils: 52 %
Platelets: 266 10*3/uL (ref 150–450)
RBC: 4.38 x10E6/uL (ref 3.77–5.28)
RDW: 12.9 % (ref 11.7–15.4)
WBC: 7 10*3/uL (ref 3.4–10.8)

## 2022-05-05 LAB — LIPID PANEL
Chol/HDL Ratio: 4.1 ratio (ref 0.0–4.4)
Cholesterol, Total: 169 mg/dL (ref 100–199)
HDL: 41 mg/dL (ref >39)
LDL Chol Calc (NIH): 102 mg/dL — ABNORMAL HIGH (ref 0–99)
Triglycerides: 147 mg/dL (ref 0–149)
VLDL Cholesterol Cal: 26 mg/dL (ref 5–40)

## 2022-05-05 LAB — TSH: TSH: 7.29 u[IU]/mL — ABNORMAL HIGH (ref 0.450–4.500)

## 2022-05-05 LAB — HEMOGLOBIN A1C
Est. average glucose Bld gHb Est-mCnc: 137 mg/dL
Hgb A1c MFr Bld: 6.4 % — ABNORMAL HIGH (ref 4.8–5.6)

## 2022-05-05 LAB — T4, FREE: Free T4: 0.84 ng/dL (ref 0.82–1.77)

## 2022-05-05 NOTE — Addendum Note (Signed)
Addended by: Larae Grooms on: 05/05/2022 08:39 AM   Modules accepted: Orders

## 2022-05-05 NOTE — Progress Notes (Signed)
Please let patient know that her lab work looks good.  Her kidney function remains stable.  Her TSH- thyroid lab is abnormal.  Please verify that she is taking her medication first thing in the morning on an empty stomach with only water and waiting at least 30 minutes before eating, drinking or taking any other medications.  I would like her to come back as a lab visit to have her thyroid lab repeated in 6 weeks.  Otherwise, her lab work looks great.  A1c is well controlled at 6.4%.  Continue with current medication regimen.  Follow up as discussed.

## 2022-05-09 ENCOUNTER — Ambulatory Visit: Payer: Medicare Other | Admitting: Nurse Practitioner

## 2022-05-13 ENCOUNTER — Other Ambulatory Visit: Payer: Self-pay | Admitting: Nurse Practitioner

## 2022-05-13 MED ORDER — HYDROXYZINE HCL 25 MG PO TABS: 25.0000 mg | ORAL_TABLET | Freq: Every evening | ORAL | 0 refills | Status: DC | PRN

## 2022-05-13 MED ORDER — HYDROXYZINE HCL 25 MG PO TABS: 25.0000 mg | ORAL_TABLET | Freq: Every evening | ORAL | 1 refills | Status: DC | PRN

## 2022-05-13 NOTE — Telephone Encounter (Signed)
Requested Prescriptions  Pending Prescriptions Disp Refills   hydrOXYzine (ATARAX) 25 MG tablet 90 tablet 1    Sig: Take 1 tablet (25 mg total) by mouth at bedtime as needed.     Ear, Nose, and Throat:  Antihistamines 2 Failed - 05/13/2022 11:09 AM      Failed - Cr in normal range and within 360 days    Creatinine, Ser  Date Value Ref Range Status  05/04/2022 1.05 (H) 0.57 - 1.00 mg/dL Final         Passed - Valid encounter within last 12 months    Recent Outpatient Visits           1 week ago Essential hypertension   [place] [facility] [place], Clydie Braun, NP   5 months ago Pneumonia of left lower lobe due to infectious organism   [place] [facility] Mecum, Oswaldo Conroy, PA-C   6 months ago Pneumonia of left lower lobe due to infectious organism   [place] [facility] [place], Megan P, DO   6 months ago Moderate persistent asthma with acute exacerbation   [place] [facility] Mecum, Erin E, PA-C   6 months ago Stage 3 chronic kidney disease, unspecified whether stage 3a or 3b CKD (HCC)   [place] [facility] Larae Grooms, NP       Future Appointments             In 5 months Larae Grooms, NP  [facility] [place]

## 2022-05-13 NOTE — Telephone Encounter (Signed)
Medication Refill - Medication: hydrOXYzine (ATARAX) 25 MG tablet [[id number]]   Pt would like a short term dosage sent to [facility]. Pt will take her last pill on Sunday.  Has the patient contacted their pharmacy? Yes.   (Agent: If no, request that the patient contact the pharmacy for the refill. If patient does not wish to contact the pharmacy document the reason why and proceed with request.) (Agent: If yes, when and what did the pharmacy advise?)  Preferred Pharmacy (with phone number or street name):[facility] - [place], [place] - [address]  [address], [place] [address]   Has the patient been seen for an appointment in the last year OR does the patient have an upcoming appointment? Yes.    Agent: Please be advised that RX refills may take up to 3 business days. We ask that you follow-up with your pharmacy.

## 2022-05-13 NOTE — Telephone Encounter (Signed)
Sending a short supply to [facility].  Requested Prescriptions  Pending Prescriptions Disp Refills   hydrOXYzine (ATARAX) 25 MG tablet 90 tablet 1    Sig: Take 1 tablet (25 mg total) by mouth at bedtime as needed.     There is no refill protocol information for this order    Signed Prescriptions Disp Refills   hydrOXYzine (ATARAX) 25 MG tablet 90 tablet 1    Sig: Take 1 tablet (25 mg total) by mouth at bedtime as needed.     Ear, Nose, and Throat:  Antihistamines 2 Failed - 05/13/2022 11:09 AM      Failed - Cr in normal range and within 360 days    Creatinine, Ser  Date Value Ref Range Status  05/04/2022 1.05 (H) 0.57 - 1.00 mg/dL Final         Passed - Valid encounter within last 12 months    Recent Outpatient Visits           1 week ago Essential hypertension   [place] [facility] [place], Clydie Braun, NP   5 months ago Pneumonia of left lower lobe due to infectious organism   [place] [facility] Mecum, Oswaldo Conroy, PA-C   6 months ago Pneumonia of left lower lobe due to infectious organism   [place] [facility] [place], Megan P, DO   6 months ago Moderate persistent asthma with acute exacerbation   [place] [facility] Mecum, Erin E, PA-C   6 months ago Stage 3 chronic kidney disease, unspecified whether stage 3a or 3b CKD (HCC)   [place] [facility] Larae Grooms, NP       Future Appointments             In 5 months Larae Grooms, NP [place] [facility], [facility]

## 2022-05-13 NOTE — Addendum Note (Signed)
Addended by: Wilford Corner on: 05/13/2022 11:24 AM   Modules accepted: Orders

## 2022-05-16 DIAGNOSIS — Z79899 Other long term (current) drug therapy: Secondary | ICD-10-CM | POA: Diagnosis not present

## 2022-05-16 DIAGNOSIS — N1831 Chronic kidney disease, stage 3a: Secondary | ICD-10-CM | POA: Diagnosis not present

## 2022-05-16 DIAGNOSIS — Z9181 History of falling: Secondary | ICD-10-CM | POA: Diagnosis not present

## 2022-05-16 DIAGNOSIS — J449 Chronic obstructive pulmonary disease, unspecified: Secondary | ICD-10-CM | POA: Diagnosis not present

## 2022-05-16 DIAGNOSIS — M25572 Pain in left ankle and joints of left foot: Secondary | ICD-10-CM | POA: Diagnosis not present

## 2022-05-16 DIAGNOSIS — R030 Elevated blood-pressure reading, without diagnosis of hypertension: Secondary | ICD-10-CM | POA: Diagnosis not present

## 2022-05-16 DIAGNOSIS — G894 Chronic pain syndrome: Secondary | ICD-10-CM | POA: Diagnosis not present

## 2022-05-16 DIAGNOSIS — Z79891 Long term (current) use of opiate analgesic: Secondary | ICD-10-CM | POA: Diagnosis not present

## 2022-05-16 DIAGNOSIS — E559 Vitamin D deficiency, unspecified: Secondary | ICD-10-CM | POA: Diagnosis not present

## 2022-05-16 DIAGNOSIS — N183 Chronic kidney disease, stage 3 unspecified: Secondary | ICD-10-CM | POA: Diagnosis not present

## 2022-05-19 DIAGNOSIS — Z79899 Other long term (current) drug therapy: Secondary | ICD-10-CM | POA: Diagnosis not present

## 2022-06-17 ENCOUNTER — Other Ambulatory Visit: Payer: Medicare Other

## 2022-06-17 DIAGNOSIS — R030 Elevated blood-pressure reading, without diagnosis of hypertension: Secondary | ICD-10-CM | POA: Diagnosis not present

## 2022-06-17 DIAGNOSIS — Z79891 Long term (current) use of opiate analgesic: Secondary | ICD-10-CM | POA: Diagnosis not present

## 2022-06-17 DIAGNOSIS — M19079 Primary osteoarthritis, unspecified ankle and foot: Secondary | ICD-10-CM | POA: Diagnosis not present

## 2022-06-17 DIAGNOSIS — Z9181 History of falling: Secondary | ICD-10-CM | POA: Diagnosis not present

## 2022-06-17 DIAGNOSIS — Z79899 Other long term (current) drug therapy: Secondary | ICD-10-CM | POA: Diagnosis not present

## 2022-06-17 DIAGNOSIS — N183 Chronic kidney disease, stage 3 unspecified: Secondary | ICD-10-CM | POA: Diagnosis not present

## 2022-06-17 DIAGNOSIS — M25572 Pain in left ankle and joints of left foot: Secondary | ICD-10-CM | POA: Diagnosis not present

## 2022-06-17 DIAGNOSIS — N1831 Chronic kidney disease, stage 3a: Secondary | ICD-10-CM | POA: Diagnosis not present

## 2022-06-17 DIAGNOSIS — J449 Chronic obstructive pulmonary disease, unspecified: Secondary | ICD-10-CM | POA: Diagnosis not present

## 2022-06-17 DIAGNOSIS — G894 Chronic pain syndrome: Secondary | ICD-10-CM | POA: Diagnosis not present

## 2022-06-17 DIAGNOSIS — E559 Vitamin D deficiency, unspecified: Secondary | ICD-10-CM | POA: Diagnosis not present

## 2022-06-22 ENCOUNTER — Other Ambulatory Visit: Payer: Medicare Other

## 2022-06-22 DIAGNOSIS — E038 Other specified hypothyroidism: Secondary | ICD-10-CM | POA: Diagnosis not present

## 2022-06-22 DIAGNOSIS — Z79899 Other long term (current) drug therapy: Secondary | ICD-10-CM | POA: Diagnosis not present

## 2022-06-23 LAB — T4, FREE: Free T4: 1.02 ng/dL (ref 0.82–1.77)

## 2022-06-23 LAB — TSH: TSH: 6.52 u[IU]/mL — ABNORMAL HIGH (ref 0.450–4.500)

## 2022-06-23 MED ORDER — LEVOTHYROXINE SODIUM 75 MCG PO TABS: 75.0000 ug | ORAL_TABLET | Freq: Every day | ORAL | 1 refills | Status: DC

## 2022-06-23 NOTE — Progress Notes (Signed)
Please let patient know that her TSH is still abnormal.  I would like to increase her levothyroxine dose to .  I would also like her to come back in 6 weeks and repeat her labs.

## 2022-07-08 ENCOUNTER — Other Ambulatory Visit: Payer: Self-pay | Admitting: Nurse Practitioner

## 2022-07-11 ENCOUNTER — Telehealth: Payer: Self-pay | Admitting: Nurse Practitioner

## 2022-07-11 NOTE — Telephone Encounter (Signed)
Simvastatin refilled 05/04/22 #90 with 1 refill. Levothyroxine 50 mcg D/C 06/23/22. Requested Prescriptions  Refused Prescriptions Disp Refills   simvastatin (ZOCOR) 40 MG tablet [Pharmacy Med Name: Simvastatin 40 MG Oral Tablet] 100 tablet 2    Sig: TAKE 1 TABLET BY MOUTH DAILY     Cardiovascular:  Antilipid - Statins Failed - 07/08/2022 10:29 PM      Failed - Lipid Panel in normal range within the last 12 months    Cholesterol, Total  Date Value Ref Range Status  05/04/2022 169 100 - 199 mg/dL Final   LDL Chol Calc (NIH)  Date Value Ref Range Status  05/04/2022 102 (H) 0 - 99 mg/dL Final   HDL  Date Value Ref Range Status  05/04/2022 41 >39 mg/dL Final   Triglycerides  Date Value Ref Range Status  05/04/2022 147 0 - 149 mg/dL Final         Passed - Patient is not pregnant      Passed - Valid encounter within last 12 months    Recent Outpatient Visits           2 months ago Essential hypertension   [facility] [place], Clydie Braun, NP   7 months ago Pneumonia of left lower lobe due to infectious organism   [place] [facility] Mecum, Oswaldo Conroy, PA-C   8 months ago Pneumonia of left lower lobe due to infectious organism   [place] [facility] [place], Megan P, DO   8 months ago Moderate persistent asthma with acute exacerbation   [place] [facility] Mecum, Erin E, PA-C   8 months ago Stage 3 chronic kidney disease, unspecified whether stage 3a or 3b CKD (HCC)   [place] [facility] Larae Grooms, NP       Future Appointments             In 3 months Larae Grooms, NP [place] [facility]             levothyroxine (SYNTHROID) 50 MCG tablet [Pharmacy Med Name: Levothyroxine Sodium 50 MCG Oral Tablet] 100 tablet 2    Sig: TAKE 1 TABLET BY MOUTH DAILY  BEFORE BREAKFAST     Endocrinology:  Hypothyroid Agents Failed - 07/08/2022 10:29 PM      Failed - TSH  in normal range and within 360 days    TSH  Date Value Ref Range Status  06/22/2022 6.520 (H) 0.450 - 4.500 uIU/mL Final         Passed - Valid encounter within last 12 months    Recent Outpatient Visits           2 months ago Essential hypertension   [place] [facility] Larae Grooms, NP   7 months ago Pneumonia of left lower lobe due to infectious organism   [place] [facility] Mecum, Oswaldo Conroy, PA-C   8 months ago Pneumonia of left lower lobe due to infectious organism   [place] [facility] [place], Megan P, DO   8 months ago Moderate persistent asthma with acute exacerbation   [place] [facility] Mecum, Erin E, PA-C   8 months ago Stage 3 chronic kidney disease, unspecified whether stage 3a or 3b CKD (HCC)   [place] [facility] Larae Grooms, NP       Future Appointments             In 3 months  Larae Grooms, NP [facility], [facility]

## 2022-07-11 NOTE — Telephone Encounter (Unsigned)
Copied from CRM [id number]. Topic: General - Other >> Jul 11, 2022 11:25 AM Dondra Prader E wrote: Reason for CRM: Pt called and wants to know why her recent refill requests for simvastatin and levothyroxine have been denied?   Please advise: [phone]

## 2022-07-12 MED ORDER — SIMVASTATIN 40 MG PO TABS: 40.0000 mg | ORAL_TABLET | Freq: Every day | ORAL | 1 refills | Status: DC

## 2022-07-12 MED ORDER — LEVOTHYROXINE SODIUM 75 MCG PO TABS: 75.0000 ug | ORAL_TABLET | Freq: Every day | ORAL | 1 refills | Status: DC

## 2022-07-12 NOTE — Telephone Encounter (Signed)
Called and spoke to patient. Explained to the patient that the reason her refills were denied is because she had one sent on 05/04/22 for 90 with 1 refill and the other sent in on 06/23/22 for 90 with 1 refill. Patient states that she needs both medications [facility] so [facility] can mail her medications to her. Prescriptions t'd up.

## 2022-07-12 NOTE — Telephone Encounter (Signed)
Medications sent to the pharmacy.

## 2022-07-12 NOTE — Telephone Encounter (Signed)
Pt is calling in to check on the status of her refills. Pt wants to know why it has been denied and says no one has called her back. Please advise.

## 2022-07-13 NOTE — Telephone Encounter (Signed)
Notified patient that her prescriptions was sent in to pharmacy for pick up.

## 2022-07-14 ENCOUNTER — Other Ambulatory Visit: Payer: Self-pay | Admitting: Nurse Practitioner

## 2022-07-15 DIAGNOSIS — E559 Vitamin D deficiency, unspecified: Secondary | ICD-10-CM | POA: Diagnosis not present

## 2022-07-15 DIAGNOSIS — Z79891 Long term (current) use of opiate analgesic: Secondary | ICD-10-CM | POA: Diagnosis not present

## 2022-07-15 DIAGNOSIS — Z79899 Other long term (current) drug therapy: Secondary | ICD-10-CM | POA: Diagnosis not present

## 2022-07-15 DIAGNOSIS — M25572 Pain in left ankle and joints of left foot: Secondary | ICD-10-CM | POA: Diagnosis not present

## 2022-07-15 DIAGNOSIS — M19079 Primary osteoarthritis, unspecified ankle and foot: Secondary | ICD-10-CM | POA: Diagnosis not present

## 2022-07-15 DIAGNOSIS — G894 Chronic pain syndrome: Secondary | ICD-10-CM | POA: Diagnosis not present

## 2022-07-15 DIAGNOSIS — N1831 Chronic kidney disease, stage 3a: Secondary | ICD-10-CM | POA: Diagnosis not present

## 2022-07-15 DIAGNOSIS — Z9181 History of falling: Secondary | ICD-10-CM | POA: Diagnosis not present

## 2022-07-15 DIAGNOSIS — N183 Chronic kidney disease, stage 3 unspecified: Secondary | ICD-10-CM | POA: Diagnosis not present

## 2022-07-15 DIAGNOSIS — J449 Chronic obstructive pulmonary disease, unspecified: Secondary | ICD-10-CM | POA: Diagnosis not present

## 2022-07-15 DIAGNOSIS — R030 Elevated blood-pressure reading, without diagnosis of hypertension: Secondary | ICD-10-CM | POA: Diagnosis not present

## 2022-07-15 NOTE — Telephone Encounter (Signed)
Requested Prescriptions  Pending Prescriptions Disp Refills   albuterol (VENTOLIN HFA) 108 (90 Base) MCG/ACT inhaler [Pharmacy Med Name: Ventolin HFA 108 (90 Base) MCG/ACT Inhalation Aerosol Solution] 17 g 0    Sig: USE 1 INHALATION BY MOUTH EVERY  6 HOURS AS NEEDED     Pulmonology:  Beta Agonists 2 Passed - 07/14/2022 10:46 PM      Passed - Last BP in normal range    BP Readings from Last 1 Encounters:  05/04/22 105/67         Passed - Last Heart Rate in normal range    Pulse Readings from Last 1 Encounters:  05/04/22 60         Passed - Valid encounter within last 12 months    Recent Outpatient Visits           2 months ago Essential hypertension   [facility] [place], Clydie Braun, NP   7 months ago Pneumonia of left lower lobe due to infectious organism   [place] [facility] Mecum, Oswaldo Conroy, PA-C   8 months ago Pneumonia of left lower lobe due to infectious organism   [place] [facility], Megan P, DO   8 months ago Moderate persistent asthma with acute exacerbation   [place] [facility] Mecum, Erin E, PA-C   8 months ago Stage 3 chronic kidney disease, unspecified whether stage 3a or 3b CKD (HCC)   [place] [facility] Larae Grooms, NP       Future Appointments             In 3 months Larae Grooms, NP [place] [facility], [facility]

## 2022-07-28 DIAGNOSIS — U071 COVID-19: Secondary | ICD-10-CM | POA: Diagnosis not present

## 2022-07-28 DIAGNOSIS — Z20822 Contact with and (suspected) exposure to covid-19: Secondary | ICD-10-CM | POA: Diagnosis not present

## 2022-08-04 ENCOUNTER — Other Ambulatory Visit: Payer: Medicare Other

## 2022-08-06 ENCOUNTER — Other Ambulatory Visit: Payer: Self-pay | Admitting: Nurse Practitioner

## 2022-08-08 NOTE — Telephone Encounter (Signed)
Requested Prescriptions  Pending Prescriptions Disp Refills   albuterol (VENTOLIN HFA) 108 (90 Base) MCG/ACT inhaler [Pharmacy Med Name: Ventolin HFA 108 (90 Base) MCG/ACT Inhalation Aerosol Solution] 18 g 2    Sig: USE 1 INHALATION BY MOUTH EVERY  6 HOURS AS NEEDED     Pulmonology:  Beta Agonists 2 Passed - 08/06/2022 10:26 PM      Passed - Last BP in normal range    BP Readings from Last 1 Encounters:  05/04/22 105/67         Passed - Last Heart Rate in normal range    Pulse Readings from Last 1 Encounters:  05/04/22 60         Passed - Valid encounter within last 12 months    Recent Outpatient Visits           3 months ago Essential hypertension   [place] [facility] [place], Clydie Braun, NP   8 months ago Pneumonia of left lower lobe due to infectious organism   [place] [facility] Mecum, Oswaldo Conroy, PA-C   9 months ago Pneumonia of left lower lobe due to infectious organism   [place] [facility] [place], Megan P, DO   9 months ago Moderate persistent asthma with acute exacerbation   [place] [facility] Mecum, Erin E, PA-C   9 months ago Stage 3 chronic kidney disease, unspecified whether stage 3a or 3b CKD (HCC)   [place] [facility] Larae Grooms, NP       Future Appointments             In 2 months Larae Grooms, NP [place] [facility]

## 2022-08-15 DIAGNOSIS — Z79899 Other long term (current) drug therapy: Secondary | ICD-10-CM | POA: Diagnosis not present

## 2022-08-15 DIAGNOSIS — N1831 Chronic kidney disease, stage 3a: Secondary | ICD-10-CM | POA: Diagnosis not present

## 2022-08-15 DIAGNOSIS — R030 Elevated blood-pressure reading, without diagnosis of hypertension: Secondary | ICD-10-CM | POA: Diagnosis not present

## 2022-08-15 DIAGNOSIS — N183 Chronic kidney disease, stage 3 unspecified: Secondary | ICD-10-CM | POA: Diagnosis not present

## 2022-08-15 DIAGNOSIS — M25572 Pain in left ankle and joints of left foot: Secondary | ICD-10-CM | POA: Diagnosis not present

## 2022-08-15 DIAGNOSIS — M19079 Primary osteoarthritis, unspecified ankle and foot: Secondary | ICD-10-CM | POA: Diagnosis not present

## 2022-08-15 DIAGNOSIS — G894 Chronic pain syndrome: Secondary | ICD-10-CM | POA: Diagnosis not present

## 2022-08-15 DIAGNOSIS — Z9181 History of falling: Secondary | ICD-10-CM | POA: Diagnosis not present

## 2022-08-15 DIAGNOSIS — E559 Vitamin D deficiency, unspecified: Secondary | ICD-10-CM | POA: Diagnosis not present

## 2022-08-15 DIAGNOSIS — J449 Chronic obstructive pulmonary disease, unspecified: Secondary | ICD-10-CM | POA: Diagnosis not present

## 2022-08-27 ENCOUNTER — Other Ambulatory Visit: Payer: Self-pay | Admitting: Nurse Practitioner

## 2022-08-29 NOTE — Telephone Encounter (Signed)
Requested Prescriptions  Pending Prescriptions Disp Refills   cloNIDine (CATAPRES) 0.2 MG tablet [Pharmacy Med Name: cloNIDine HCl 0.2 MG Oral Tablet] 300 tablet 0    Sig: TAKE 1 TABLET BY MOUTH 3 TIMES  DAILY     Cardiovascular:  Alpha-2 Agonists Passed - 08/27/2022 10:06 PM      Passed - Last BP in normal range    BP Readings from Last 1 Encounters:  05/04/22 105/67         Passed - Last Heart Rate in normal range    Pulse Readings from Last 1 Encounters:  05/04/22 60         Passed - Valid encounter within last 6 months    Recent Outpatient Visits           3 months ago Essential hypertension   [place] [facility] Larae Grooms, NP   9 months ago Pneumonia of left lower lobe due to infectious organism   [place] [facility] Mecum, Oswaldo Conroy, PA-C   9 months ago Pneumonia of left lower lobe due to infectious organism   [place] [facility] [place], Megan P, DO   10 months ago Moderate persistent asthma with acute exacerbation   [place] [facility] Mecum, Erin E, PA-C   10 months ago Stage 3 chronic kidney disease, unspecified whether stage 3a or 3b CKD (HCC)   [place] [facility] Larae Grooms, NP       Future Appointments             In 2 months Larae Grooms, NP [place] [facility]

## 2022-08-30 ENCOUNTER — Other Ambulatory Visit: Payer: Medicare Other

## 2022-08-30 DIAGNOSIS — E038 Other specified hypothyroidism: Secondary | ICD-10-CM | POA: Diagnosis not present

## 2022-08-31 ENCOUNTER — Other Ambulatory Visit: Payer: Self-pay | Admitting: Family Medicine

## 2022-08-31 MED ORDER — LEVOTHYROXINE SODIUM 75 MCG PO TABS: 75.0000 ug | ORAL_TABLET | Freq: Every day | ORAL | 3 refills | Status: DC

## 2022-09-12 DIAGNOSIS — G894 Chronic pain syndrome: Secondary | ICD-10-CM | POA: Diagnosis not present

## 2022-09-12 DIAGNOSIS — N1831 Chronic kidney disease, stage 3a: Secondary | ICD-10-CM | POA: Diagnosis not present

## 2022-09-12 DIAGNOSIS — Z79899 Other long term (current) drug therapy: Secondary | ICD-10-CM | POA: Diagnosis not present

## 2022-09-12 DIAGNOSIS — N183 Chronic kidney disease, stage 3 unspecified: Secondary | ICD-10-CM | POA: Diagnosis not present

## 2022-09-12 DIAGNOSIS — R030 Elevated blood-pressure reading, without diagnosis of hypertension: Secondary | ICD-10-CM | POA: Diagnosis not present

## 2022-09-12 DIAGNOSIS — E559 Vitamin D deficiency, unspecified: Secondary | ICD-10-CM | POA: Diagnosis not present

## 2022-09-12 DIAGNOSIS — Z9181 History of falling: Secondary | ICD-10-CM | POA: Diagnosis not present

## 2022-09-12 DIAGNOSIS — M19079 Primary osteoarthritis, unspecified ankle and foot: Secondary | ICD-10-CM | POA: Diagnosis not present

## 2022-09-12 DIAGNOSIS — M25572 Pain in left ankle and joints of left foot: Secondary | ICD-10-CM | POA: Diagnosis not present

## 2022-09-12 DIAGNOSIS — J449 Chronic obstructive pulmonary disease, unspecified: Secondary | ICD-10-CM | POA: Diagnosis not present

## 2022-09-15 ENCOUNTER — Other Ambulatory Visit: Payer: Self-pay | Admitting: Nurse Practitioner

## 2022-09-15 DIAGNOSIS — Z79899 Other long term (current) drug therapy: Secondary | ICD-10-CM | POA: Diagnosis not present

## 2022-09-16 NOTE — Telephone Encounter (Signed)
100 day supply sent at phar. Request  Requested Prescriptions  Pending Prescriptions Disp Refills   levothyroxine (SYNTHROID) 75 MCG tablet [Pharmacy Med Name: Levothyroxine Sodium 75 MCG Oral Tablet] 100 tablet 2    Sig: TAKE 1 TABLET BY MOUTH DAILY     Endocrinology:  Hypothyroid Agents Passed - 09/15/2022 10:45 PM      Passed - TSH in normal range and within 360 days    TSH  Date Value Ref Range Status  08/30/2022 2.450 0.450 - 4.500 uIU/mL Final         Passed - Valid encounter within last 12 months    Recent Outpatient Visits           4 months ago Essential hypertension   [place] [facility] Larae Grooms, NP   9 months ago Pneumonia of left lower lobe due to infectious organism   [place] [facility] Mecum, Oswaldo Conroy, PA-C   10 months ago Pneumonia of left lower lobe due to infectious organism   [place] [facility] [place], Megan P, DO   10 months ago Moderate persistent asthma with acute exacerbation   [place] [facility] Mecum, Erin E, PA-C   11 months ago Stage 3 chronic kidney disease, unspecified whether stage 3a or 3b CKD (HCC)   [place] [facility] Larae Grooms, NP       Future Appointments             In 1 month Larae Grooms, NP [place] [facility], [facility]

## 2022-09-21 ENCOUNTER — Other Ambulatory Visit: Payer: Self-pay | Admitting: Nurse Practitioner

## 2022-09-22 NOTE — Telephone Encounter (Signed)
Requested medication (s) are due for refill today: Yes  Requested medication (s) are on the active medication list: Yes  Last refill:  05/04/22  Future visit scheduled: Yes  Notes to clinic:  Not delegated.    Requested Prescriptions  Pending Prescriptions Disp Refills   QUEtiapine Fumarate (SEROQUEL XR) 150 MG 24 hr tablet [Pharmacy Med Name: QUETIAPINE  150MG   TAB  XR] 90 tablet 3    Sig: TAKE 1 TABLET BY MOUTH AT  BEDTIME     Not Delegated - Psychiatry:  Antipsychotics - Second Generation (Atypical) - quetiapine Failed - 09/21/2022 10:39 PM      Failed - This refill cannot be delegated      Failed - Lipid Panel in normal range within the last 12 months    Cholesterol, Total  Date Value Ref Range Status  05/04/2022 169 100 - 199 mg/dL Final   LDL Chol Calc (NIH)  Date Value Ref Range Status  05/04/2022 102 (H) 0 - 99 mg/dL Final   HDL  Date Value Ref Range Status  05/04/2022 41 >39 mg/dL Final   Triglycerides  Date Value Ref Range Status  05/04/2022 147 0 - 149 mg/dL Final         Passed - TSH in normal range and within 360 days    TSH  Date Value Ref Range Status  08/30/2022 2.450 0.450 - 4.500 uIU/mL Final         Passed - Completed PHQ-2 or PHQ-9 in the last 360 days      Passed - Last BP in normal range    BP Readings from Last 1 Encounters:  05/04/22 105/67         Passed - Last Heart Rate in normal range    Pulse Readings from Last 1 Encounters:  05/04/22 60         Passed - Valid encounter within last 6 months    Recent Outpatient Visits           4 months ago Essential hypertension   [place] [facility] Larae Grooms, NP   10 months ago Pneumonia of left lower lobe due to infectious organism   [place] [facility] Mecum, Erin E, PA-C   10 months ago Pneumonia of left lower lobe due to infectious organism   [place] [facility] [place], Megan P, DO   10 months ago Moderate persistent asthma  with acute exacerbation   [place] [facility] Mecum, Erin E, PA-C   11 months ago Stage 3 chronic kidney disease, unspecified whether stage 3a or 3b CKD (HCC)   [place] [facility] Larae Grooms, NP       Future Appointments             In 1 month Larae Grooms, NP  [facility]            Passed - CBC within normal limits and completed in the last 12 months    WBC  Date Value Ref Range Status  05/04/2022 7.0 3.4 - 10.8 x10E3/uL Final  09/18/2017 8.8 3.6 - 11.0 K/uL Final   RBC  Date Value Ref Range Status  05/04/2022 4.38 3.77 - 5.28 x10E6/uL Final  03/22/2021 4.1 3.87 - 5.11 Final   Hemoglobin  Date Value Ref Range Status  05/04/2022 13.7 11.1 - 15.9 g/dL Final   Hematocrit  Date Value Ref Range Status  05/04/2022 41.4 34.0 - 46.6 % Final  MCHC  Date Value Ref Range Status  05/04/2022 33.1 31.5 - 35.7 g/dL Final  40/98/1191 47.8 32.0 - 36.0 g/dL Final   [facility]  Date Value Ref Range Status  05/04/2022 31.3 26.6 - 33.0 pg Final  09/18/2017 31.6 26.0 - 34.0 pg Final   MCV  Date Value Ref Range Status  05/04/2022 95 79 - 97 fL Final   No results found for: "PLTCOUNTKUC", "LABPLAT", "POCPLA" RDW  Date Value Ref Range Status  05/04/2022 12.9 11.7 - 15.4 % Final         Passed - CMP within normal limits and completed in the last 12 months    Albumin  Date Value Ref Range Status  05/04/2022 4.3 3.9 - 4.9 g/dL Final   Alkaline Phosphatase  Date Value Ref Range Status  05/04/2022 77 44 - 121 IU/L Final   ALT  Date Value Ref Range Status  05/04/2022 31 0 - 32 IU/L Final   AST  Date Value Ref Range Status  05/04/2022 31 0 - 40 IU/L Final   BUN  Date Value Ref Range Status  05/04/2022 11 8 - 27 mg/dL Final   Calcium  Date Value Ref Range Status  05/04/2022 9.4 8.7 - 10.3 mg/dL Final   CO2  Date Value Ref Range Status  05/04/2022 22 20 - 29 mmol/L Final   Creatinine, Ser  Date  Value Ref Range Status  05/04/2022 1.05 (H) 0.57 - 1.00 mg/dL Final   Glucose  Date Value Ref Range Status  05/04/2022 95 70 - 99 mg/dL Final  29/56/2130 865  Final   Glucose, Bld  Date Value Ref Range Status  09/18/2017 99 70 - 99 mg/dL Final   Potassium  Date Value Ref Range Status  05/04/2022 4.3 3.5 - 5.2 mmol/L Final   Sodium  Date Value Ref Range Status  05/04/2022 139 134 - 144 mmol/L Final   Bilirubin Total  Date Value Ref Range Status  05/04/2022 0.7 0.0 - 1.2 mg/dL Final   Bilirubin, Direct  Date Value Ref Range Status  09/18/2017 0.1 0.0 - 0.2 mg/dL Final   Indirect Bilirubin  Date Value Ref Range Status  09/18/2017 0.7 0.3 - 0.9 mg/dL Final    Comment:    Performed at [facility], [address] [place], [place] [address]   Protein,UA  Date Value Ref Range Status  05/04/2022 Negative Negative/Trace Final   Total Protein  Date Value Ref Range Status  05/04/2022 6.8 6.0 - 8.5 g/dL Final   GFR calc Af Amer  Date Value Ref Range Status  07/15/2019 59 (L) >59 mL/min/1.73 Final    Comment:    **[facility] currently reports eGFR in compliance with the current**   recommendations of the [employer]. [facility] will   update reporting as new guidelines are published from the NKF-ASN   Task force.    eGFR  Date Value Ref Range Status  05/04/2022 59 (L) >59 mL/min/1.73 Final   GFR calc non Af Amer  Date Value Ref Range Status  07/15/2019 51 (L) >59 mL/min/1.73 Final

## 2022-09-23 ENCOUNTER — Telehealth: Payer: Self-pay | Admitting: Nurse Practitioner

## 2022-09-23 NOTE — Telephone Encounter (Signed)
[facility] is calling in because they need Kayla Wright's approval to change the manufacturer for levothyroxine (SYNTHROID) 75 MCG tablet [[id number]] . [facility] says it is okay if someone does it in Kayla Wright's absence. Please follow up with [facility] [phone]

## 2022-09-27 NOTE — Telephone Encounter (Signed)
Spoke to Kayla Wright at [place] and she found the form and is faxing it back 3:24 09/27/22

## 2022-09-27 NOTE — Telephone Encounter (Signed)
Found form faxed to [facility] on 09/27/2022 @ 3:32 pm and received confirmation that the fax was complete.

## 2022-10-03 ENCOUNTER — Other Ambulatory Visit: Payer: Self-pay | Admitting: Nurse Practitioner

## 2022-10-05 NOTE — Telephone Encounter (Signed)
Requested Prescriptions  Pending Prescriptions Disp Refills   FLUoxetine (PROZAC) 20 MG capsule [Pharmacy Med Name: FLUoxetine HCl 20 MG Oral Capsule] 100 capsule 0    Sig: TAKE 1 CAPSULE BY MOUTH DAILY  WITH A 40 MG CAPSULE FOR TOTAL  60 MG DAILY     Psychiatry:  Antidepressants - SSRI Passed - 10/03/2022 10:35 PM      Passed - Completed PHQ-2 or PHQ-9 in the last 360 days      Passed - Valid encounter within last 6 months    Recent Outpatient Visits           5 months ago Essential hypertension   [place] [facility] Larae Grooms, NP   10 months ago Pneumonia of left lower lobe due to infectious organism   [place] [facility] Mecum, Oswaldo Conroy, PA-C   11 months ago Pneumonia of left lower lobe due to infectious organism   [place] [facility] [place], Megan P, DO   11 months ago Moderate persistent asthma with acute exacerbation   [facility] Mecum, Erin E, PA-C   11 months ago Stage 3 chronic kidney disease, unspecified whether stage 3a or 3b CKD (HCC)   [place] [facility] Larae Grooms, NP       Future Appointments             In 4 weeks Larae Grooms, NP [place] [facility], [facility]

## 2022-10-14 DIAGNOSIS — Z79891 Long term (current) use of opiate analgesic: Secondary | ICD-10-CM | POA: Diagnosis not present

## 2022-10-14 DIAGNOSIS — R5383 Other fatigue: Secondary | ICD-10-CM | POA: Diagnosis not present

## 2022-10-14 DIAGNOSIS — E559 Vitamin D deficiency, unspecified: Secondary | ICD-10-CM | POA: Diagnosis not present

## 2022-10-14 DIAGNOSIS — Z9181 History of falling: Secondary | ICD-10-CM | POA: Diagnosis not present

## 2022-10-14 DIAGNOSIS — D539 Nutritional anemia, unspecified: Secondary | ICD-10-CM | POA: Diagnosis not present

## 2022-10-14 DIAGNOSIS — M19172 Post-traumatic osteoarthritis, left ankle and foot: Secondary | ICD-10-CM | POA: Diagnosis not present

## 2022-10-14 DIAGNOSIS — E7800 Pure hypercholesterolemia, unspecified: Secondary | ICD-10-CM | POA: Diagnosis not present

## 2022-10-14 DIAGNOSIS — Z1159 Encounter for screening for other viral diseases: Secondary | ICD-10-CM | POA: Diagnosis not present

## 2022-10-14 DIAGNOSIS — M129 Arthropathy, unspecified: Secondary | ICD-10-CM | POA: Diagnosis not present

## 2022-10-14 DIAGNOSIS — M25572 Pain in left ankle and joints of left foot: Secondary | ICD-10-CM | POA: Diagnosis not present

## 2022-10-20 ENCOUNTER — Other Ambulatory Visit: Payer: Self-pay | Admitting: Nurse Practitioner

## 2022-10-20 DIAGNOSIS — Z79899 Other long term (current) drug therapy: Secondary | ICD-10-CM | POA: Diagnosis not present

## 2022-10-21 NOTE — Telephone Encounter (Signed)
Requested Prescriptions  Pending Prescriptions Disp Refills   FLUoxetine (PROZAC) 40 MG capsule [Pharmacy Med Name: FLUOXETINE  40MG   CAP] 100 capsule 0    Sig: TAKE 1 CAPSULE BY MOUTH DAILY     Psychiatry:  Antidepressants - SSRI Passed - 10/20/2022 10:04 PM      Passed - Completed PHQ-2 or PHQ-9 in the last 360 days      Passed - Valid encounter within last 6 months    Recent Outpatient Visits           5 months ago Essential hypertension   [place] [facility] Larae Grooms, NP   10 months ago Pneumonia of left lower lobe due to infectious organism   [place] Mecum, Oswaldo Conroy, PA-C   11 months ago Pneumonia of left lower lobe due to infectious organism   [place] [facility] [place], Megan P, DO   11 months ago Moderate persistent asthma with acute exacerbation   [place] [facility] Mecum, Erin E, PA-C   1 year ago Stage 3 chronic kidney disease, unspecified whether stage 3a or 3b CKD (HCC)   [place] [facility] Larae Grooms, NP       Future Appointments             In 1 week Larae Grooms, NP [place] [facility]            Refused Prescriptions Disp Refills   simvastatin (ZOCOR) 40 MG tablet [Pharmacy Med Name: Simvastatin 40 MG Oral Tablet] 100 tablet 2    Sig: TAKE 1 TABLET BY MOUTH DAILY     Cardiovascular:  Antilipid - Statins Failed - 10/20/2022 10:04 PM      Failed - Lipid Panel in normal range within the last 12 months    Cholesterol, Total  Date Value Ref Range Status  05/04/2022 169 100 - 199 mg/dL Final   LDL Chol Calc (NIH)  Date Value Ref Range Status  05/04/2022 102 (H) 0 - 99 mg/dL Final   HDL  Date Value Ref Range Status  05/04/2022 41 >39 mg/dL Final   Triglycerides  Date Value Ref Range Status  05/04/2022 147 0 - 149 mg/dL Final         Passed - Patient is not pregnant      Passed - Valid encounter within last  12 months    Recent Outpatient Visits           5 months ago Essential hypertension   [place] [facility] Larae Grooms, NP   10 months ago Pneumonia of left lower lobe due to infectious organism   [place] [facility] Mecum, Oswaldo Conroy, PA-C   11 months ago Pneumonia of left lower lobe due to infectious organism   [place] [facility] [place], Megan P, DO   11 months ago Moderate persistent asthma with acute exacerbation   [place] [facility] Mecum, Erin E, PA-C   1 year ago Stage 3 chronic kidney disease, unspecified whether stage 3a or 3b CKD (HCC)   [place] [facility] Larae Grooms, NP       Future Appointments             In 1 week Larae Grooms, NP [place] [facility], [facility]

## 2022-11-03 ENCOUNTER — Ambulatory Visit: Payer: Medicare Other | Admitting: Nurse Practitioner

## 2022-11-03 ENCOUNTER — Encounter: Payer: Self-pay | Admitting: Nurse Practitioner

## 2022-11-03 VITALS — BP 103/66 | HR 58 | Temp 98.6°F | Wt 232.8 lb

## 2022-11-03 DIAGNOSIS — N183 Chronic kidney disease, stage 3 unspecified: Secondary | ICD-10-CM | POA: Diagnosis not present

## 2022-11-03 DIAGNOSIS — E785 Hyperlipidemia, unspecified: Secondary | ICD-10-CM | POA: Diagnosis not present

## 2022-11-03 DIAGNOSIS — R7301 Impaired fasting glucose: Secondary | ICD-10-CM | POA: Diagnosis not present

## 2022-11-03 DIAGNOSIS — E559 Vitamin D deficiency, unspecified: Secondary | ICD-10-CM | POA: Diagnosis not present

## 2022-11-03 DIAGNOSIS — I10 Essential (primary) hypertension: Secondary | ICD-10-CM

## 2022-11-03 DIAGNOSIS — Z23 Encounter for immunization: Secondary | ICD-10-CM | POA: Diagnosis not present

## 2022-11-03 DIAGNOSIS — E038 Other specified hypothyroidism: Secondary | ICD-10-CM | POA: Diagnosis not present

## 2022-11-03 DIAGNOSIS — F333 Major depressive disorder, recurrent, severe with psychotic symptoms: Secondary | ICD-10-CM | POA: Diagnosis not present

## 2022-11-03 DIAGNOSIS — G894 Chronic pain syndrome: Secondary | ICD-10-CM

## 2022-11-03 MED ORDER — SIMVASTATIN 40 MG PO TABS: 40.0000 mg | ORAL_TABLET | Freq: Every day | ORAL | 1 refills | Status: DC

## 2022-11-03 MED ORDER — CLONIDINE HCL 0.2 MG PO TABS: 0.2000 mg | ORAL_TABLET | Freq: Three times a day (TID) | ORAL | 1 refills | Status: DC

## 2022-11-03 MED ORDER — QUETIAPINE FUMARATE ER 150 MG PO TB24: 150.0000 mg | ORAL_TABLET | Freq: Every day | ORAL | 1 refills | Status: DC

## 2022-11-03 MED ORDER — FLUOXETINE HCL 20 MG PO CAPS: ORAL_CAPSULE | ORAL | 1 refills | Status: DC

## 2022-11-03 MED ORDER — HYDROXYZINE HCL 25 MG PO TABS: 25.0000 mg | ORAL_TABLET | Freq: Every evening | ORAL | 1 refills | Status: DC | PRN

## 2022-11-03 MED ORDER — FLUOXETINE HCL 40 MG PO CAPS: 40.0000 mg | ORAL_CAPSULE | Freq: Every day | ORAL | 1 refills | Status: DC

## 2022-11-03 NOTE — Assessment & Plan Note (Signed)
Chronic.  Could benefit from SGTL2. Labs ordered at visit today.  Will make recommendations based on lab results.

## 2022-11-03 NOTE — Assessment & Plan Note (Signed)
Chronic.  Controlled.  Continue with current medication regimen of Levothyroxine 39mg daily.  Refills sent today.  Labs ordered today.  Return to clinic in 6 months for reevaluation.  Call sooner if concerns arise.

## 2022-11-03 NOTE — Assessment & Plan Note (Signed)
Recommended eating smaller high protein, low fat meals more frequently and exercising 30 mins a day 5 times a week with a goal of 10-15lb weight loss in the next 3 months.  

## 2022-11-03 NOTE — Assessment & Plan Note (Signed)
Chronic.  Controlled.  Continue with current medication regimen of Prozac and Seroquel.  Feels like she is doing well despite PHQ9 and GAD7.  Refills sent today.  Labs ordered today.  Return to clinic in 6 months for reevaluation.  Call sooner if concerns arise.

## 2022-11-03 NOTE — Assessment & Plan Note (Signed)
Chronic.  Controlled.  Continue with current medication regimen of Clonidine.  Blood pressures are running 100/60s at home.  Continue to check blood pressures at home.  Refills sent today.  Labs ordered today.  Return to clinic in 6 months for reevaluation.  Call sooner if concerns arise.

## 2022-11-03 NOTE — Progress Notes (Signed)
BP 103/66   Pulse (!) 58   Temp 98.6 F (37 C) (Oral)   Wt 232 lb 12.8 oz (105.6 kg)   SpO2 97%   BMI 35.41 kg/m    Subjective:    Patient ID: Kayla Wright, female    DOB: 1955-01-28, 67 y.o.   MRN: [id number]  HPI: Kayla Wright is a 67 y.o. female  Chief Complaint  Patient presents with   Hypertension   HYPERTENSION / HYPERLIPIDEMIA Satisfied with current treatment? yes Duration of hypertension: years BP monitoring frequency: some BP range: 100/60 BP medication side effects: no Past BP meds: clonidine Duration of hyperlipidemia: years Cholesterol medication side effects: no Cholesterol supplements: none Past cholesterol medications: simvastatin (zocor) Medication compliance: excellent compliance Aspirin: no Recent stressors: no Recurrent headaches: no Visual changes: no Palpitations: no Dyspnea: sometimes- uses Albuterol 1x every 2-3 days Chest pain: no Lower extremity edema: no Dizzy/lightheaded: no  HYPOTHYROIDISM Levothyroxine daily. Thyroid control status:controlled Satisfied with current treatment? no Medication side effects: no Medication compliance: excellent compliance Etiology of hypothyroidism:  Recent dose adjustment:no Fatigue: yes Cold intolerance: no Heat intolerance: no Weight gain: no Weight loss: no Constipation: no Diarrhea/loose stools: no Palpitations: no Lower extremity edema: no Anxiety/depressed mood: no  CHRONIC KIDNEY DISEASE CKD status: controlled Medications renally dose: yes Previous renal evaluation: no Pneumovax:  Up to Date Influenza Vaccine:  Up to Date  DEPRESSION/ANXIETY She does have some down feelings but feels like the prozac is still working for her.  Denies concerns at visit today.  Taking Fluoxetine 60mg  and Seroquel 150mg .  Denies SI.    Flowsheet Row Office Visit from 11/03/2022 in [facility]  PHQ-9 Total Score 15         11/03/2022    3:05 PM 05/04/2022   11:01 AM  11/01/2021    2:34 PM 10/19/2021    2:54 PM  GAD 7 : Generalized Anxiety Score  Nervous, Anxious, on Edge 2 1 2 2   Control/stop worrying 1 2 2 3   Worry too much - different things 2 2 2 3   Trouble relaxing 3 3 3 3   Restless 2 2 3 2   Easily annoyed or irritable 2 2 2 2   Afraid - awful might happen 1 2 2 1   Total GAD 7 Score 13 14 16 16   Anxiety Difficulty Somewhat difficult Somewhat difficult Very difficult Somewhat difficult     Relevant past medical, surgical, family and social history reviewed and updated as indicated. Interim medical history since our last visit reviewed. Allergies and medications reviewed and updated.  Review of Systems  Constitutional:  Negative for fatigue, fever and unexpected weight change.  Eyes:  Negative for visual disturbance.  Respiratory:  Negative for cough, chest tightness and shortness of breath.   Cardiovascular:  Negative for chest pain, palpitations and leg swelling.  Gastrointestinal:  Negative for constipation and diarrhea.  Endocrine: Negative for cold intolerance and heat intolerance.  Neurological:  Negative for dizziness and headaches.  Psychiatric/Behavioral:  Negative for dysphoric mood. The patient is not nervous/anxious.     Per HPI unless specifically indicated above     Objective:    BP 103/66   Pulse (!) 58   Temp 98.6 F (37 C) (Oral)   Wt 232 lb 12.8 oz (105.6 kg)   SpO2 97%   BMI 35.41 kg/m   Wt Readings from Last 3 Encounters:  11/03/22 232 lb 12.8 oz (105.6 kg)  05/04/22 234 lb 11.2 oz (  106.5 kg)  03/07/22 234 lb (106.1 kg)    Physical Exam Vitals and nursing note reviewed.  Constitutional:      General: She is not in acute distress.    Appearance: Normal appearance. She is obese. She is not ill-appearing, toxic-appearing or diaphoretic.  HENT:     Head: Normocephalic.     Right Ear: External ear normal.     Left Ear: External ear normal.     Nose: Nose normal.     Mouth/Throat:     Mouth: Mucous membranes  are moist.     Pharynx: Oropharynx is clear.  Eyes:     General:        Right eye: No discharge.        Left eye: No discharge.     Extraocular Movements: Extraocular movements intact.     Conjunctiva/sclera: Conjunctivae normal.     Pupils: Pupils are equal, round, and reactive to light.  Cardiovascular:     Rate and Rhythm: Normal rate and regular rhythm.     Heart sounds: No murmur heard. Pulmonary:     Effort: Pulmonary effort is normal. No respiratory distress.     Breath sounds: Normal breath sounds. No wheezing or rales.  Musculoskeletal:     Cervical back: Normal range of motion and neck supple.  Skin:    General: Skin is warm and dry.     Capillary Refill: Capillary refill takes less than 2 seconds.  Neurological:     General: No focal deficit present.     Mental Status: She is alert and oriented to person, place, and time. Mental status is at baseline.  Psychiatric:        Mood and Affect: Mood normal.        Behavior: Behavior normal.        Thought Content: Thought content normal.        Judgment: Judgment normal.     Results for orders placed or performed in visit on 08/30/22  T4, free  Result Value Ref Range   Free T4 0.99 0.82 - 1.77 ng/dL  TSH  Result Value Ref Range   TSH 2.450 0.450 - 4.500 uIU/mL      Assessment & Plan:   Problem List Items Addressed This Visit       Cardiovascular and Mediastinum   Essential hypertension    Chronic.  Controlled.  Continue with current medication regimen of Clonidine.  Blood pressures are running 100/60s at home.  Continue to check blood pressures at home.  Refills sent today.  Labs ordered today.  Return to clinic in 6 months for reevaluation.  Call sooner if concerns arise.        Relevant Medications   cloNIDine (CATAPRES) 0.2 MG tablet   simvastatin (ZOCOR) 40 MG tablet   Other Relevant Orders   Comp Met (CMET)     Endocrine   Hypothyroidism    Chronic.  Controlled.  Continue with current medication  regimen of Levothyroxine daily.  Refills sent today.  Labs ordered today.  Return to clinic in 6 months for reevaluation.  Call sooner if concerns arise.        Relevant Orders   TSH   T4, free   IFG (impaired fasting glucose)    Labs ordered at visit today.  Will make recommendations based on lab results.  Recommend low carb diet.       Relevant Orders   HgB A1c     Genitourinary   CKD (  chronic kidney disease) stage 3, GFR 30-59 ml/min (HCC) - Primary    Chronic.  Could benefit from SGTL2. Labs ordered at visit today.  Will make recommendations based on lab results.          Other   Chronic pain syndrome (Chronic)    Chronic.  Continue to follow up with pain management.        Relevant Medications   FLUoxetine (PROZAC) 40 MG capsule   FLUoxetine (PROZAC) 20 MG capsule   Hyperlipidemia    Chronic.  Controlled.  Continue with current medication regimen of Simvastatin. Refills sent today.   Labs ordered today.  Return to clinic in 6 months for reevaluation.  Call sooner if concerns arise.        Relevant Medications   cloNIDine (CATAPRES) 0.2 MG tablet   simvastatin (ZOCOR) 40 MG tablet   Other Relevant Orders   Lipid Profile   Morbid obesity (HCC)    Recommended eating smaller high protein, low fat meals more frequently and exercising 30 mins a day 5 times a week with a goal of 10-15lb weight loss in the next 3 months.       MDD (major depressive disorder), recurrent, severe, with psychosis (HCC)    Chronic.  Controlled.  Continue with current medication regimen of Prozac and Seroquel.  Feels like she is doing well despite PHQ9 and GAD7.  Refills sent today.  Labs ordered today.  Return to clinic in 6 months for reevaluation.  Call sooner if concerns arise.        Relevant Medications   FLUoxetine (PROZAC) 40 MG capsule   FLUoxetine (PROZAC) 20 MG capsule   hydrOXYzine (ATARAX) 25 MG tablet   Other Visit Diagnoses     Vitamin D deficiency       Relevant  Orders   Vitamin D (25 hydroxy)   Need for influenza vaccination       Relevant Orders   Flu Vaccine Trivalent High Dose (Fluad)   Need for COVID-19 vaccine       Relevant Orders   Pfizer Comirnaty Covid -19 Vaccine 34yrs and older        Follow up plan: Return in about 6 months (around 05/04/2023) for Physical and Fasting labs.

## 2022-11-03 NOTE — Assessment & Plan Note (Signed)
Labs ordered at visit today.  Will make recommendations based on lab results.  Recommend low carb diet.

## 2022-11-03 NOTE — Assessment & Plan Note (Signed)
Chronic.  Continue to follow up with pain management.

## 2022-11-03 NOTE — Assessment & Plan Note (Signed)
Chronic.  Controlled.  Continue with current medication regimen of Simvastatin. Refills sent today.   Labs ordered today.  Return to clinic in 6 months for reevaluation.  Call sooner if concerns arise.

## 2022-11-04 LAB — LIPID PANEL
Chol/HDL Ratio: 4.4 {ratio} (ref 0.0–4.4)
Cholesterol, Total: 169 mg/dL (ref 100–199)
HDL: 38 mg/dL — ABNORMAL LOW (ref >39)
LDL Chol Calc (NIH): 97 mg/dL (ref 0–99)
Triglycerides: 194 mg/dL — ABNORMAL HIGH (ref 0–149)
VLDL Cholesterol Cal: 34 mg/dL (ref 5–40)

## 2022-11-04 LAB — COMPREHENSIVE METABOLIC PANEL
ALT: 31 [IU]/L (ref 0–32)
AST: 27 [IU]/L (ref 0–40)
Albumin: 4.3 g/dL (ref 3.9–4.9)
Alkaline Phosphatase: 83 [IU]/L (ref 44–121)
BUN/Creatinine Ratio: 13 (ref 12–28)
BUN: 12 mg/dL (ref 8–27)
Bilirubin Total: 0.8 mg/dL (ref 0.0–1.2)
CO2: 22 mmol/L (ref 20–29)
Calcium: 9.4 mg/dL (ref 8.7–10.3)
Chloride: 104 mmol/L (ref 96–106)
Creatinine, Ser: 0.9 mg/dL (ref 0.57–1.00)
Globulin, Total: 2.5 g/dL (ref 1.5–4.5)
Glucose: 117 mg/dL — ABNORMAL HIGH (ref 70–99)
Potassium: 4 mmol/L (ref 3.5–5.2)
Sodium: 141 mmol/L (ref 134–144)
Total Protein: 6.8 g/dL (ref 6.0–8.5)
eGFR: 71 mL/min/{1.73_m2} (ref >59)

## 2022-11-04 LAB — TSH: TSH: 5.21 u[IU]/mL — ABNORMAL HIGH (ref 0.450–4.500)

## 2022-11-04 LAB — T4, FREE: Free T4: 1.03 ng/dL (ref 0.82–1.77)

## 2022-11-04 LAB — HEMOGLOBIN A1C
Est. average glucose Bld gHb Est-mCnc: 143 mg/dL
Hgb A1c MFr Bld: 6.6 % — ABNORMAL HIGH (ref 4.8–5.6)

## 2022-11-04 LAB — VITAMIN D 25 HYDROXY (VIT D DEFICIENCY, FRACTURES): Vit D, 25-Hydroxy: 44.4 ng/mL (ref 30.0–100.0)

## 2022-11-04 NOTE — Progress Notes (Signed)
Please let patient know that her A1c is now in the diabetic range.  She is well controlled and no need for medications at this time.  We can discuss this further at her next visit.  Her thyroid is slightly elevated.  But T4 is within normal limits.  We will continue with her current dose of medication.  No other concerns at this time.

## 2022-11-07 ENCOUNTER — Ambulatory Visit (INDEPENDENT_AMBULATORY_CARE_PROVIDER_SITE_OTHER): Payer: Medicare Other | Admitting: Internal Medicine

## 2022-11-07 VITALS — BP 133/64 | HR 61 | Resp 16 | Ht 66.0 in | Wt 233.0 lb

## 2022-11-07 DIAGNOSIS — I10 Essential (primary) hypertension: Secondary | ICD-10-CM | POA: Diagnosis not present

## 2022-11-07 DIAGNOSIS — G4733 Obstructive sleep apnea (adult) (pediatric): Secondary | ICD-10-CM

## 2022-11-07 DIAGNOSIS — Z7189 Other specified counseling: Secondary | ICD-10-CM | POA: Diagnosis not present

## 2022-11-07 NOTE — Progress Notes (Unsigned)
[facility] [address] [place], [place] [address]  Pulmonary Sleep Medicine   Office Visit Note  Patient Name: Kayla Wright DOB: August 16, 1955 MRN [id number]    Chief Complaint: Obstructive Sleep Apnea visit  Brief History:  Kayla Wright is seen today for an annual follow up on APAP 5-20cmh20.  The patient has a 4 year history of sleep apnea. Patient is using PAP nightly.  The patient feels rested after sleeping with PAP.  The patient reports benefiting from PAP use. Reported sleepiness is  improved and the Epworth Sleepiness Score is 6 out of 24. The patient occasionally takes naps. The patient complains of the following: No complaints.  The compliance download shows 100% compliance with an average use time of 8.5 hours. The AHI is 1.8  The patient does not complain of limb movements disrupting sleep.  ROS  General: (-) fever, (-) chills, (-) night sweat Nose and Sinuses: (-) nasal stuffiness or itchiness, (-) postnasal drip, (-) nosebleeds, (-) sinus trouble. Mouth and Throat: (-) sore throat, (-) hoarseness. Neck: (-) swollen glands, (-) enlarged thyroid, (-) neck pain. Respiratory: - cough, - shortness of breath, - wheezing. Neurologic: - numbness, - tingling. Psychiatric: - anxiety, - depression   Current Medication: Outpatient Encounter Medications as of 11/07/2022  Medication Sig   albuterol (VENTOLIN HFA) 108 (90 Base) MCG/ACT inhaler USE 1 INHALATION BY MOUTH EVERY  6 HOURS AS NEEDED   Ascorbic Acid (VITAMIN C) 1000 MG tablet Take 1 tablet (1,000 mg total) by mouth daily.   budesonide-formoterol (SYMBICORT) 160-4.5 MCG/ACT inhaler Inhale 2 puffs into the lungs 2 (two) times daily.   Cholecalciferol (VITAMIN D3) 125 MCG (5000 UT) CAPS Take 1 capsule (5,000 Units total) by mouth daily.   cloNIDine (CATAPRES) 0.2 MG tablet Take 1 tablet (0.2 mg total) by mouth 3 (three) times daily.   Cyanocobalamin (VITAMIN B-12 PO) Take 1 tablet by mouth daily.   FLUoxetine (PROZAC)  20 MG capsule TAKE 1 CAPSULE BY MOUTH DAILY  WITH A 40 MG CAPSULE FOR TOTAL  60 MG DAILY   FLUoxetine (PROZAC) 40 MG capsule Take 1 capsule (40 mg total) by mouth daily.   hydrOXYzine (ATARAX) 25 MG tablet Take 1 tablet (25 mg total) by mouth at bedtime as needed.   levothyroxine (SYNTHROID) 75 MCG tablet TAKE 1 TABLET BY MOUTH DAILY   nystatin cream (MYCOSTATIN) Apply 1 Application topically 2 (two) times daily.   oxyCODONE (OXY IR/ROXICODONE) 5 MG immediate release tablet Take 5 mg by mouth 4 (four) times daily as needed.   simvastatin (ZOCOR) 40 MG tablet Take 1 tablet (40 mg total) by mouth daily.   [DISCONTINUED] oxyCODONE-acetaminophen (PERCOCET/ROXICET) 5-325 MG tablet Take 1 tablet by mouth 4 (four) times daily as needed.   [DISCONTINUED] QUEtiapine Fumarate (SEROQUEL XR) 150 MG 24 hr tablet Take 1 tablet (150 mg total) by mouth at bedtime.   No facility-administered encounter medications on file as of 11/07/2022.    Surgical History: Past Surgical History:  Procedure Laterality Date   ANKLE FRACTURE SURGERY  06/18/15   COLONOSCOPY WITH PROPOFOL N/A 08/31/2017   Procedure: COLONOSCOPY WITH PROPOFOL;  Surgeon: Toney Reil, MD;  Location: [facility] ENDOSCOPY;  Service: Gastroenterology;  Laterality: N/A;   TUBAL LIGATION      Medical History: Past Medical History:  Diagnosis Date   Allergy    Common migraine    Depression    GERD (gastroesophageal reflux disease)    Hyperlipidemia    Hypertension    Insomnia  Menopausal symptom    Osteopenia    Thyroid disease     Family History: Non contributory to the present illness  Social History: Social History   Socioeconomic History   Marital status: Divorced    Spouse name: Not on file   Number of children: Not on file   Years of education: Not on file   Highest education level: Not on file  Occupational History   Occupation: retired  Tobacco Use   Smoking status: Never   Smokeless tobacco: Never  Vaping Use    Vaping status: Never Used  Substance and Sexual Activity   Alcohol use: No    Alcohol/week: 0.0 standard drinks of alcohol   Drug use: Yes    Types: Oxycodone   Sexual activity: Not Currently    Birth control/protection: Surgical  Other Topics Concern   Not on file  Social History Narrative   Not on file   Social Determinants of Health   Financial Resource Strain: Low Risk  (03/07/2022)   Overall Financial Resource Strain (CARDIA)    Difficulty of Paying Living Expenses: Not hard at all  Food Insecurity: No Food Insecurity (03/07/2022)   Hunger Vital Sign    Worried About Running Out of Food in the Last Year: Never true    Ran Out of Food in the Last Year: Never true  Transportation Needs: No Transportation Needs (03/07/2022)   PRAPARE - Administrator, Civil Service (Medical): No    Lack of Transportation (Non-Medical): No  Physical Activity: Insufficiently Active (03/07/2022)   Exercise Vital Sign    Days of Exercise per Week: 2 days    Minutes of Exercise per Session: 20 min  Stress: No Stress Concern Present (03/07/2022)   [employer] of Occupational Health - Occupational Stress Questionnaire    Feeling of Stress : Not at all  Social Connections: Moderately Isolated (03/07/2022)   Social Connection and Isolation Panel [NHANES]    Frequency of Communication with Friends and Family: More than three times a week    Frequency of Social Gatherings with Friends and Family: Twice a week    Attends Religious Services: More than 4 times per year    Active Member of [employer] or Organizations: No    Attends Banker Meetings: Never    Marital Status: Widowed  Intimate Partner Violence: Not At Risk (03/07/2022)   Humiliation, Afraid, Rape, and Kick questionnaire    Fear of Current or Ex-Partner: No    Emotionally Abused: No    Physically Abused: No    Sexually Abused: No    Vital Signs: Blood pressure 133/64, pulse 61, resp. rate 16, height 5\' 6"  (1.676  m), weight 233 lb (105.7 kg), SpO2 96%. Body mass index is 37.61 kg/m.    Examination: General Appearance: The patient is well-developed, well-nourished, and in no distress. Neck Circumference: 48 cm Skin: Gross inspection of skin unremarkable. Head: normocephalic, no gross deformities. Eyes: no gross deformities noted. ENT: ears appear grossly normal Neurologic: Alert and oriented. No involuntary movements.  STOP BANG RISK ASSESSMENT S (snore) Have you been told that you snore?     NO   T (tired) Are you often tired, fatigued, or sleepy during the day?   NO  O (obstruction) Do you stop breathing, choke, or gasp during sleep? NO   P (pressure) Do you have or are you being treated for high blood pressure? YES   B (BMI) Is your body index greater than 35 kg/m?  YES   A (age) Are you 66 years old or older? YES   N (neck) Do you have a neck circumference greater than 16 inches?   YES   G (gender) Are you a female? NO   TOTAL STOP/BANG "YES" ANSWERS 4       A STOP-Bang score of 2 or less is considered low risk, and a score of 5 or more is high risk for having either moderate or severe OSA. For people who score 3 or 4, doctors may need to perform further assessment to determine how likely they are to have OSA.         EPWORTH SLEEPINESS SCALE:  Scale:  (0)= no chance of dozing; (1)= slight chance of dozing; (2)= moderate chance of dozing; (3)= high chance of dozing  Chance  Situtation    Sitting and reading: 1    Watching TV: 2    Sitting Inactive in public: 0    As a passenger in car: 0      Lying down to rest: 2    Sitting and talking: 0    Sitting quielty after lunch: 1    In a car, stopped in traffic: 0   TOTAL SCORE:   6 out of 24    SLEEP STUDIES:  PSG (05/22/18) AHI 44.2, REM AHI 63.9, SP02 79%   CPAP COMPLIANCE DATA:  Date Range: 11/02/2021-11/01/2022  Average Daily Use: 8.5 hours  Median Use: 8.5  Compliance for > 4 Hours: 100% days  AHI:  1.8 respiratory events per hour  Days Used: 324401  Mask Leak: 21.3  95th Percentile Pressure: 14.9         LABS: Recent Results (from the past 2160 hour(s))  T4, free     Status: None   Collection Time: 08/30/22  1:31 PM  Result Value Ref Range   Free T4 0.99 0.82 - 1.77 ng/dL  TSH     Status: None   Collection Time: 08/30/22  1:31 PM  Result Value Ref Range   TSH 2.450 0.450 - 4.500 uIU/mL  Comp Met (CMET)     Status: Abnormal   Collection Time: 11/03/22  3:33 PM  Result Value Ref Range   Glucose 117 (H) 70 - 99 mg/dL   BUN 12 8 - 27 mg/dL   Creatinine, Ser 0.27 0.57 - 1.00 mg/dL   eGFR 71 >25 DG/UYQ/0.34   BUN/Creatinine Ratio 13 12 - 28   Sodium 141 134 - 144 mmol/L   Potassium 4.0 3.5 - 5.2 mmol/L   Chloride 104 96 - 106 mmol/L   CO2 22 20 - 29 mmol/L   Calcium 9.4 8.7 - 10.3 mg/dL   Total Protein 6.8 6.0 - 8.5 g/dL   Albumin 4.3 3.9 - 4.9 g/dL   Globulin, Total 2.5 1.5 - 4.5 g/dL   Bilirubin Total 0.8 0.0 - 1.2 mg/dL   Alkaline Phosphatase 83 44 - 121 IU/L   AST 27 0 - 40 IU/L   ALT 31 0 - 32 IU/L  Lipid Profile     Status: Abnormal   Collection Time: 11/03/22  3:33 PM  Result Value Ref Range   Cholesterol, Total 169 100 - 199 mg/dL   Triglycerides 742 (H) 0 - 149 mg/dL   HDL 38 (L) >59 mg/dL   VLDL Cholesterol Cal 34 5 - 40 mg/dL   LDL Chol Calc (NIH) 97 0 - 99 mg/dL   Chol/HDL Ratio 4.4 0.0 - 4.4 ratio    Comment:  T. Chol/HDL Ratio                                             Men  Women                               1/2 Avg.Risk  3.4    3.3                                   Avg.Risk  5.0    4.4                                2X Avg.Risk  9.6    7.1                                3X Avg.Risk 23.4   11.0   TSH     Status: Abnormal   Collection Time: 11/03/22  3:33 PM  Result Value Ref Range   TSH 5.210 (H) 0.450 - 4.500 uIU/mL  T4, free     Status: None   Collection Time: 11/03/22  3:33 PM  Result Value Ref  Range   Free T4 1.03 0.82 - 1.77 ng/dL  HgB W0J     Status: Abnormal   Collection Time: 11/03/22  3:33 PM  Result Value Ref Range   Hgb A1c MFr Bld 6.6 (H) 4.8 - 5.6 %    Comment:          Prediabetes: 5.7 - 6.4          Diabetes: >6.4          Glycemic control for adults with diabetes: <7.0    Est. average glucose Bld gHb Est-mCnc 143 mg/dL  Vitamin D (25 hydroxy)     Status: None   Collection Time: 11/03/22  3:33 PM  Result Value Ref Range   Vit D, 25-Hydroxy 44.4 30.0 - 100.0 ng/mL    Comment: Vitamin D deficiency has been defined by the Institute of Medicine and an Endocrine Society practice guideline as a level of serum 25-OH vitamin D less than 20 ng/mL (1,2). The Endocrine Society went on to further define vitamin D insufficiency as a level between 21 and 29 ng/mL (2). 1. IOM (Institute of Medicine). 2010. Dietary reference    intakes for calcium and D. [place]: The    [employer]. 2. Holick MF, Binkley [place], Bischoff-Ferrari HA, et al.    Evaluation, treatment, and prevention of vitamin D    deficiency: an Endocrine Society clinical practice    guideline. JCEM. 2011 Jul; 96(7):1911-30.     Radiology: MM 3D SCREEN BREAST BILATERAL  Result Date: 12/17/2021 CLINICAL DATA:  Screening. EXAM: DIGITAL SCREENING BILATERAL MAMMOGRAM WITH TOMOSYNTHESIS AND CAD TECHNIQUE: Bilateral screening digital craniocaudal and mediolateral oblique mammograms were obtained. Bilateral screening digital breast tomosynthesis was performed. The images were evaluated with computer-aided detection. COMPARISON:  Previous exam(s). ACR Breast Density Category a: The breast tissue is almost entirely fatty. FINDINGS: There are no findings suspicious for malignancy. IMPRESSION: No mammographic evidence of malignancy. A result letter of this screening mammogram will be mailed directly to  the patient. RECOMMENDATION: Screening mammogram in one year. (Code:[id number]-B-01Y) BI-RADS CATEGORY  1:  Negative. Electronically Signed   By: Frederico Hamman M.D.   On: 12/17/2021 12:27   DG Bone Density  Result Date: 12/14/2021 EXAM: DUAL X-RAY ABSORPTIOMETRY (DXA) FOR BONE MINERAL DENSITY IMPRESSION: Dear Dr Caren Griffins, Your patient MELENA HAYES completed a FRAX assessment on 12/14/2021 using the Lunar iDXA DXA System (analysis version: 14.10) manufactured by [employer]. The following summarizes the results of our evaluation. PATIENT BIOGRAPHICAL: Name: My, Rinke Patient ID: [id number] Birth Date: 12-Feb-1955 Height:    66.0 in. Gender:     Female    [age]       Weight:    234.4 lbs. Ethnicity:  White                            Exam Date: 12/14/2021 FRAX* RESULTS:  (version: 3.5) 10-year Probability of Fracture1 Major Osteoporotic Fracture2 Hip Fracture 7.8% 0.7% Population: [place] (Caucasian) Risk Factors: None Based on Femur (Right) Neck BMD 1 -The 10-year probability of fracture may be lower than reported if the patient has received treatment. 2 -Major Osteoporotic Fracture: Clinical Spine, Forearm, Hip or Shoulder *FRAX is a Armed forces logistics/support/administrative officer of the [employer] of [employer] for Metabolic Bone Disease, a World Science writer (WHO) [employer]. ASSESSMENT: The probability of a major osteoporotic fracture is 7.8% within the next ten years. The probability of a hip fracture is 0.7% within the next ten years. Your patient Maggie Senseney completed a BMD test on 12/14/2021 using the [employer] DXA System (software version: 14.10) manufactured by [employer]. The following summarizes the results of our evaluation. Technologist: MTB PATIENT BIOGRAPHICAL: Name: Basil, Blakesley Patient ID: [id number] Birth Date: 20-Jul-1955 Height: 66.0 in. Gender: Female Exam Date: 12/14/2021 Weight: 234.4 lbs. Indications: Caucasian, Hysterectomy, Postmenopausal Fractures: Treatments: Calcium, Vitamin D DENSITOMETRY RESULTS: Site         Region     Measured Date Measured Age WHO  Classification Young Adult T-score BMD         %Change vs. Previous Significant Change (*) AP Spine L1-L4 12/14/2021 65.7 Osteopenia -2.4 0.892 g/cm2 - - DualFemur Neck Right 12/14/2021 65.7 Osteopenia -1.3 0.861 g/cm2 - - DualFemur Total Mean 12/14/2021 65.7 Normal 0.3 1.049 g/cm2 - - Left Forearm Radius 33% 12/14/2021 65.7 Normal 0.4 0.914 g/cm2 - - ASSESSMENT: The BMD measured at AP Spine L1-L4 is 0.892 g/cm2 with a T-score of -2.4. This patient is considered osteopenic according to World Health Organization [facility]) criteria. The scan quality is good. World Science writer [facility]) criteria for post-menopausal, Caucasian Women: Normal:                   T-score at or above -1 SD Osteopenia/low bone mass: T-score between -1 and -2.5 SD Osteoporosis:             T-score at or below -2.5 SD RECOMMENDATIONS: 1. All patients should optimize calcium and vitamin D intake. 2. Consider FDA-approved medical therapies in postmenopausal women and men aged 52 years and older, based on the following: a. A hip or vertebral(clinical or morphometric) fracture b. T-score < -2.5 at the femoral neck or spine after appropriate evaluation to exclude secondary causes c. Low bone mass (T-score between -1.0 and -2.5 at the femoral neck or spine) and a 10-year probability of a hip fracture > 3% or a  10-year probability of a major osteoporosis-related fracture > 20% based on the [place]-adapted WHO algorithm 3. Clinician judgment and/or patient preferences may indicate treatment for people with 10-year fracture probabilities above or below these levels FOLLOW-UP: People with diagnosed cases of osteoporosis or at high risk for fracture should have regular bone mineral density tests. For patients eligible for Medicare, routine testing is allowed once every 2 years. The testing frequency can be increased to one year for patients who have rapidly progressing disease, those who are receiving or discontinuing medical therapy to restore bone mass, or  have additional risk factors. I have reviewed this report, and agree with the above findings. [facility] Electronically Signed   By: Frederico Hamman M.D.   On: 12/14/2021 14:53    No results found.  No results found.    Assessment and Plan: Patient Active Problem List   Diagnosis Date Noted   MDD (major depressive disorder), recurrent, severe, with psychosis (HCC) 05/04/2022   Disturbance of sleep 11/26/2021   Skin yeast infection 11/26/2021   OSA on CPAP 11/09/2020   CPAP use counseling 11/09/2020   CKD (chronic kidney disease) stage 3, GFR 30-59 ml/min (HCC) 01/10/2020   IFG (impaired fasting glucose) 07/17/2019   Essential hypertension 09/27/2017   History of cocaine use 12/06/2016   Pain medication agreement broken 12/06/2016   Neurogenic pain 11/09/2016   Chronic bilateral low back pain with left-sided sciatica 11/09/2016   DDD (degenerative disc disease), cervical (C5-6 and C6-7) 10/11/2016   Chronic Cervical foraminal stenosis (C3-4) (Right) 10/11/2016   Osteopenia of lumbar spine 10/11/2016   DDD (degenerative disc disease), lumbar (L1-2 and L5-S1) 10/11/2016   Chronic lower extremity pain (Left) 08/29/2016   Chronic pain syndrome 08/29/2016   Long term prescription opiate use 08/29/2016   Morbid obesity (HCC) 08/09/2016   Asthma 08/09/2016   Allergic rhinitis 06/17/2016   Insomnia 11/02/2015   Murmur 07/10/2015   Hyperlipidemia 10/21/2014   Hypothyroidism 10/21/2014   Anxiety disorder 10/21/2014   Sleep apnea 07/02/2014   1. OSA on CPAP The patient does tolerate PAP and reports  benefit from PAP use. The patient was reminded how to clean equipment and advised to replace supplies routinely. The patient was also counselled on weight loss. The compliance is excellent. The AHI is 1.8.   OSA on cpap- controlled. Continue with excellent compliance with pap. CPAP continues to be medically necessary to treat this patient's OSA. F/u one year.    2.  CPAP use counseling CPAP Counseling: had a lengthy discussion with the patient regarding the importance of PAP therapy in management of the sleep apnea. Patient appears to understand the risk factor reduction and also understands the risks associated with untreated sleep apnea. Patient will try to make a good faith effort to remain compliant with therapy. Also instructed the patient on proper cleaning of the device including the water must be changed daily if possible and use of distilled water is preferred. Patient understands that the machine should be regularly cleaned with appropriate recommended cleaning solutions that do not damage the PAP machine for example given white vinegar and water rinses. Other methods such as ozone treatment may not be as good as these simple methods to achieve cleaning.   3. Essential hypertension Hypertension Counseling:   The following hypertensive lifestyle modification were recommended and discussed:  1. Limiting alcohol intake to less than 1 oz/day of ethanol:(24 oz of beer or 8 oz of wine or 2 oz of 100-proof whiskey). 2.  Take baby ASA 81 mg daily. 3. Importance of regular aerobic exercise and losing weight. 4. Reduce dietary saturated fat and cholesterol intake for overall cardiovascular health. 5. Maintaining adequate dietary potassium, calcium, and magnesium intake. 6. Regular monitoring of the blood pressure. 7. Reduce sodium intake to less than 100 mmol/day (less than 2.3 gm of sodium or less than 6 gm of sodium choride)       General Counseling: I have discussed the findings of the evaluation and examination with Windell Moulding.  I have also discussed any further diagnostic evaluation thatmay be needed or ordered today. Asjia verbalizes understanding of the findings of todays visit. We also reviewed her medications today and discussed drug interactions and side effects including but not limited excessive drowsiness and altered mental states. We also discussed that  there is always a risk not just to her but also people around her. she has been encouraged to call the office with any questions or concerns that should arise related to todays visit.  No orders of the defined types were placed in this encounter.       I have personally obtained a history, examined the patient, evaluated laboratory and imaging results, formulated the assessment and plan and placed orders. This patient was seen today by Emmaline Kluver, PA-C in collaboration with Dr. Freda Munro.   Yevonne Pax, MD [facility] Diplomate ABMS Pulmonary Critical Care Medicine and Sleep Medicine

## 2022-11-07 NOTE — Patient Instructions (Signed)

## 2022-11-08 DIAGNOSIS — G4733 Obstructive sleep apnea (adult) (pediatric): Secondary | ICD-10-CM | POA: Diagnosis not present

## 2022-11-14 DIAGNOSIS — M129 Arthropathy, unspecified: Secondary | ICD-10-CM | POA: Diagnosis not present

## 2022-11-14 DIAGNOSIS — R0602 Shortness of breath: Secondary | ICD-10-CM | POA: Diagnosis not present

## 2022-11-14 DIAGNOSIS — M25572 Pain in left ankle and joints of left foot: Secondary | ICD-10-CM | POA: Diagnosis not present

## 2022-11-14 DIAGNOSIS — M19172 Post-traumatic osteoarthritis, left ankle and foot: Secondary | ICD-10-CM | POA: Diagnosis not present

## 2022-11-14 DIAGNOSIS — R946 Abnormal results of thyroid function studies: Secondary | ICD-10-CM | POA: Diagnosis not present

## 2022-11-14 DIAGNOSIS — Z9181 History of falling: Secondary | ICD-10-CM | POA: Diagnosis not present

## 2022-11-14 DIAGNOSIS — Z79891 Long term (current) use of opiate analgesic: Secondary | ICD-10-CM | POA: Diagnosis not present

## 2022-12-08 DIAGNOSIS — R062 Wheezing: Secondary | ICD-10-CM | POA: Diagnosis not present

## 2022-12-15 DIAGNOSIS — M25572 Pain in left ankle and joints of left foot: Secondary | ICD-10-CM | POA: Diagnosis not present

## 2022-12-15 DIAGNOSIS — M19172 Post-traumatic osteoarthritis, left ankle and foot: Secondary | ICD-10-CM | POA: Diagnosis not present

## 2022-12-15 DIAGNOSIS — Z9181 History of falling: Secondary | ICD-10-CM | POA: Diagnosis not present

## 2022-12-15 DIAGNOSIS — R946 Abnormal results of thyroid function studies: Secondary | ICD-10-CM | POA: Diagnosis not present

## 2022-12-15 DIAGNOSIS — Z79891 Long term (current) use of opiate analgesic: Secondary | ICD-10-CM | POA: Diagnosis not present

## 2022-12-15 DIAGNOSIS — M109 Gout, unspecified: Secondary | ICD-10-CM | POA: Diagnosis not present

## 2022-12-26 DIAGNOSIS — R946 Abnormal results of thyroid function studies: Secondary | ICD-10-CM | POA: Diagnosis not present

## 2023-01-11 DIAGNOSIS — M25572 Pain in left ankle and joints of left foot: Secondary | ICD-10-CM | POA: Diagnosis not present

## 2023-01-11 DIAGNOSIS — R5383 Other fatigue: Secondary | ICD-10-CM | POA: Diagnosis not present

## 2023-01-11 DIAGNOSIS — M19172 Post-traumatic osteoarthritis, left ankle and foot: Secondary | ICD-10-CM | POA: Diagnosis not present

## 2023-01-11 DIAGNOSIS — Z131 Encounter for screening for diabetes mellitus: Secondary | ICD-10-CM | POA: Diagnosis not present

## 2023-01-11 DIAGNOSIS — Z79891 Long term (current) use of opiate analgesic: Secondary | ICD-10-CM | POA: Diagnosis not present

## 2023-01-11 DIAGNOSIS — Z0000 Encounter for general adult medical examination without abnormal findings: Secondary | ICD-10-CM | POA: Diagnosis not present

## 2023-01-11 DIAGNOSIS — M47817 Spondylosis without myelopathy or radiculopathy, lumbosacral region: Secondary | ICD-10-CM | POA: Diagnosis not present

## 2023-01-11 DIAGNOSIS — Z9181 History of falling: Secondary | ICD-10-CM | POA: Diagnosis not present

## 2023-02-03 ENCOUNTER — Other Ambulatory Visit: Payer: Self-pay | Admitting: Nurse Practitioner

## 2023-02-03 DIAGNOSIS — Z1231 Encounter for screening mammogram for malignant neoplasm of breast: Secondary | ICD-10-CM

## 2023-02-08 ENCOUNTER — Ambulatory Visit
Admission: RE | Admit: 2023-02-08 | Discharge: 2023-02-08 | Disposition: A | Discharge status: Home / Self Care | Payer: Medicare Other | Source: Ambulatory Visit | Attending: Nurse Practitioner | Admitting: Nurse Practitioner

## 2023-02-08 DIAGNOSIS — Z1231 Encounter for screening mammogram for malignant neoplasm of breast: Secondary | ICD-10-CM | POA: Insufficient documentation

## 2023-02-08 DIAGNOSIS — G4733 Obstructive sleep apnea (adult) (pediatric): Secondary | ICD-10-CM | POA: Diagnosis not present

## 2023-02-09 ENCOUNTER — Other Ambulatory Visit: Payer: Self-pay | Admitting: Nurse Practitioner

## 2023-02-09 DIAGNOSIS — R928 Other abnormal and inconclusive findings on diagnostic imaging of breast: Secondary | ICD-10-CM

## 2023-02-10 DIAGNOSIS — Z79891 Long term (current) use of opiate analgesic: Secondary | ICD-10-CM | POA: Diagnosis not present

## 2023-02-10 DIAGNOSIS — Z9181 History of falling: Secondary | ICD-10-CM | POA: Diagnosis not present

## 2023-02-10 DIAGNOSIS — M47817 Spondylosis without myelopathy or radiculopathy, lumbosacral region: Secondary | ICD-10-CM | POA: Diagnosis not present

## 2023-02-10 DIAGNOSIS — M19172 Post-traumatic osteoarthritis, left ankle and foot: Secondary | ICD-10-CM | POA: Diagnosis not present

## 2023-02-10 DIAGNOSIS — M25572 Pain in left ankle and joints of left foot: Secondary | ICD-10-CM | POA: Diagnosis not present

## 2023-02-14 DIAGNOSIS — Z79899 Other long term (current) drug therapy: Secondary | ICD-10-CM | POA: Diagnosis not present

## 2023-02-16 ENCOUNTER — Other Ambulatory Visit: Payer: Self-pay | Admitting: Nurse Practitioner

## 2023-02-16 DIAGNOSIS — R928 Other abnormal and inconclusive findings on diagnostic imaging of breast: Secondary | ICD-10-CM

## 2023-02-21 ENCOUNTER — Other Ambulatory Visit: Payer: Self-pay | Admitting: Nurse Practitioner

## 2023-02-21 ENCOUNTER — Ambulatory Visit
Admission: RE | Admit: 2023-02-21 | Discharge: 2023-02-21 | Disposition: A | Discharge status: Home / Self Care | Payer: Medicare Other | Source: Ambulatory Visit | Attending: Nurse Practitioner | Admitting: Nurse Practitioner

## 2023-02-21 DIAGNOSIS — R928 Other abnormal and inconclusive findings on diagnostic imaging of breast: Secondary | ICD-10-CM

## 2023-02-21 DIAGNOSIS — R92322 Mammographic fibroglandular density, left breast: Secondary | ICD-10-CM | POA: Diagnosis not present

## 2023-02-21 DIAGNOSIS — J4541 Moderate persistent asthma with (acute) exacerbation: Secondary | ICD-10-CM

## 2023-02-21 DIAGNOSIS — N6002 Solitary cyst of left breast: Secondary | ICD-10-CM | POA: Diagnosis not present

## 2023-02-23 NOTE — Telephone Encounter (Signed)
Requested Prescriptions  Pending Prescriptions Disp Refills   budesonide-formoterol (SYMBICORT) 160-4.5 MCG/ACT inhaler [Pharmacy Med Name: Symbicort 160-4.5 MCG/ACT Inhalation Aerosol] 30.6 g 1    Sig: USE 2 INHALATIONS BY MOUTH TWICE DAILY     Pulmonology:  Combination Products Passed - 02/23/2023  1:39 PM      Passed - Valid encounter within last 12 months    Recent Outpatient Visits           3 months ago Stage 3 chronic kidney disease, unspecified whether stage 3a or 3b CKD (HCC)   [facility] Larae Grooms, NP   9 months ago Essential hypertension   [facility] Larae Grooms, NP   1 year ago Pneumonia of left lower lobe due to infectious organism   [facility] Mecum, Oswaldo Conroy, PA-C   1 year ago Pneumonia of left lower lobe due to infectious organism   [facility] [place], Megan P, DO   1 year ago Moderate persistent asthma with acute exacerbation    [facility] Mecum, Oswaldo Conroy, PA-C

## 2023-03-11 ENCOUNTER — Other Ambulatory Visit: Payer: Self-pay | Admitting: Nurse Practitioner

## 2023-03-13 DIAGNOSIS — Z79891 Long term (current) use of opiate analgesic: Secondary | ICD-10-CM | POA: Diagnosis not present

## 2023-03-13 DIAGNOSIS — M19172 Post-traumatic osteoarthritis, left ankle and foot: Secondary | ICD-10-CM | POA: Diagnosis not present

## 2023-03-13 DIAGNOSIS — Z9181 History of falling: Secondary | ICD-10-CM | POA: Diagnosis not present

## 2023-03-13 DIAGNOSIS — E039 Hypothyroidism, unspecified: Secondary | ICD-10-CM | POA: Diagnosis not present

## 2023-03-13 DIAGNOSIS — M47817 Spondylosis without myelopathy or radiculopathy, lumbosacral region: Secondary | ICD-10-CM | POA: Diagnosis not present

## 2023-03-13 DIAGNOSIS — Z0000 Encounter for general adult medical examination without abnormal findings: Secondary | ICD-10-CM | POA: Diagnosis not present

## 2023-03-13 DIAGNOSIS — M25572 Pain in left ankle and joints of left foot: Secondary | ICD-10-CM | POA: Diagnosis not present

## 2023-03-13 NOTE — Telephone Encounter (Signed)
 Requested medication (s) are due for refill today - no  Requested medication (s) are on the active medication list -no  Future visit scheduled -no  Last refill: unsure  Notes to clinic: non delegated Rx- no longer on current medication list  Requested Prescriptions  Pending Prescriptions Disp Refills   QUEtiapine Fumarate (SEROQUEL XR) 150 MG 24 hr tablet [Pharmacy Med Name: QUETIAPINE  150MG   TAB  XR] 90 tablet 3    Sig: TAKE 1 TABLET BY MOUTH AT  BEDTIME     Not Delegated - Psychiatry:  Antipsychotics - Second Generation (Atypical) - quetiapine Failed - 03/13/2023  9:32 AM      Failed - This refill cannot be delegated      Failed - TSH in normal range and within 360 days    TSH  Date Value Ref Range Status  11/03/2022 5.210 (H) 0.450 - 4.500 uIU/mL Final         Failed - Lipid Panel in normal range within the last 12 months    Cholesterol, Total  Date Value Ref Range Status  11/03/2022 169 100 - 199 mg/dL Final   LDL Chol Calc (NIH)  Date Value Ref Range Status  11/03/2022 97 0 - 99 mg/dL Final   HDL  Date Value Ref Range Status  11/03/2022 38 (L) >39 mg/dL Final   Triglycerides  Date Value Ref Range Status  11/03/2022 194 (H) 0 - 149 mg/dL Final         Passed - Completed PHQ-2 or PHQ-9 in the last 360 days      Passed - Last BP in normal range    BP Readings from Last 1 Encounters:  11/07/22 133/64         Passed - Last Heart Rate in normal range    Pulse Readings from Last 1 Encounters:  11/07/22 61         Passed - Valid encounter within last 6 months    Recent Outpatient Visits           4 months ago Stage 3 chronic kidney disease, unspecified whether stage 3a or 3b CKD (HCC)   [place] [facility] Larae Grooms, NP   10 months ago Essential hypertension   [place] [facility] [place], Clydie Braun, NP   1 year ago Pneumonia of left lower lobe due to infectious organism   [place] [facility] Mecum,  Oswaldo Conroy, PA-C   1 year ago Pneumonia of left lower lobe due to infectious organism   [place] [facility] [place], Megan P, DO   1 year ago Moderate persistent asthma with acute exacerbation   [place] [facility] Mecum, Erin E, PA-C              Passed - CBC within normal limits and completed in the last 12 months    WBC  Date Value Ref Range Status  05/04/2022 7.0 3.4 - 10.8 x10E3/uL Final  09/18/2017 8.8 3.6 - 11.0 K/uL Final   RBC  Date Value Ref Range Status  05/04/2022 4.38 3.77 - 5.28 x10E6/uL Final  03/22/2021 4.1 3.87 - 5.11 Final   Hemoglobin  Date Value Ref Range Status  05/04/2022 13.7 11.1 - 15.9 g/dL Final   Hematocrit  Date Value Ref Range Status  05/04/2022 41.4 34.0 - 46.6 % Final   MCHC  Date Value Ref Range Status  05/04/2022 33.1 31.5 - 35.7 g/dL Final  16/10/9602 54.0 32.0 - 36.0  g/dL Final   [facility]  Date Value Ref Range Status  05/04/2022 31.3 26.6 - 33.0 pg Final  09/18/2017 31.6 26.0 - 34.0 pg Final   MCV  Date Value Ref Range Status  05/04/2022 95 79 - 97 fL Final   No results found for: "PLTCOUNTKUC", "LABPLAT", "POCPLA" RDW  Date Value Ref Range Status  05/04/2022 12.9 11.7 - 15.4 % Final         Passed - CMP within normal limits and completed in the last 12 months    Albumin  Date Value Ref Range Status  11/03/2022 4.3 3.9 - 4.9 g/dL Final   Alkaline Phosphatase  Date Value Ref Range Status  11/03/2022 83 44 - 121 IU/L Final   ALT  Date Value Ref Range Status  11/03/2022 31 0 - 32 IU/L Final   AST  Date Value Ref Range Status  11/03/2022 27 0 - 40 IU/L Final   BUN  Date Value Ref Range Status  11/03/2022 12 8 - 27 mg/dL Final   Calcium  Date Value Ref Range Status  11/03/2022 9.4 8.7 - 10.3 mg/dL Final   CO2  Date Value Ref Range Status  11/03/2022 22 20 - 29 mmol/L Final   Creatinine, Ser  Date Value Ref Range Status  11/03/2022 0.90 0.57 - 1.00 mg/dL Final   Glucose  Date  Value Ref Range Status  11/03/2022 117 (H) 70 - 99 mg/dL Final  19/14/7829 562  Final   Glucose, Bld  Date Value Ref Range Status  09/18/2017 99 70 - 99 mg/dL Final   Potassium  Date Value Ref Range Status  11/03/2022 4.0 3.5 - 5.2 mmol/L Final   Sodium  Date Value Ref Range Status  11/03/2022 141 134 - 144 mmol/L Final   Bilirubin Total  Date Value Ref Range Status  11/03/2022 0.8 0.0 - 1.2 mg/dL Final   Bilirubin, Direct  Date Value Ref Range Status  09/18/2017 0.1 0.0 - 0.2 mg/dL Final   Indirect Bilirubin  Date Value Ref Range Status  09/18/2017 0.7 0.3 - 0.9 mg/dL Final    Comment:    Performed at [facility], [address] [place], [place] [address]   Protein,UA  Date Value Ref Range Status  05/04/2022 Negative Negative/Trace Final   Total Protein  Date Value Ref Range Status  11/03/2022 6.8 6.0 - 8.5 g/dL Final   GFR calc Af Amer  Date Value Ref Range Status  07/15/2019 59 (L) >59 mL/min/1.73 Final    Comment:    **[facility] currently reports eGFR in compliance with the current**   recommendations of the [employer]. [facility] will   update reporting as new guidelines are published from the NKF-ASN   Task force.    eGFR  Date Value Ref Range Status  11/03/2022 71 >59 mL/min/1.73 Final   GFR calc non Af Amer  Date Value Ref Range Status  07/15/2019 51 (L) >59 mL/min/1.73 Final            Requested Prescriptions  Pending Prescriptions Disp Refills   QUEtiapine Fumarate (SEROQUEL XR) 150 MG 24 hr tablet [Pharmacy Med Name: QUETIAPINE  150MG   TAB  XR] 90 tablet 3    Sig: TAKE 1 TABLET BY MOUTH AT  BEDTIME     Not Delegated - Psychiatry:  Antipsychotics - Second Generation (Atypical) - quetiapine Failed - 03/13/2023  9:32 AM      Failed - This refill cannot be delegated      Failed -  TSH in normal range and within 360 days    TSH  Date Value Ref Range Status  11/03/2022 5.210 (H) 0.450 - 4.500 uIU/mL Final          Failed - Lipid Panel in normal range within the last 12 months    Cholesterol, Total  Date Value Ref Range Status  11/03/2022 169 100 - 199 mg/dL Final   LDL Chol Calc (NIH)  Date Value Ref Range Status  11/03/2022 97 0 - 99 mg/dL Final   HDL  Date Value Ref Range Status  11/03/2022 38 (L) >39 mg/dL Final   Triglycerides  Date Value Ref Range Status  11/03/2022 194 (H) 0 - 149 mg/dL Final         Passed - Completed PHQ-2 or PHQ-9 in the last 360 days      Passed - Last BP in normal range    BP Readings from Last 1 Encounters:  11/07/22 133/64         Passed - Last Heart Rate in normal range    Pulse Readings from Last 1 Encounters:  11/07/22 61         Passed - Valid encounter within last 6 months    Recent Outpatient Visits           4 months ago Stage 3 chronic kidney disease, unspecified whether stage 3a or 3b CKD (HCC)   [place] [facility] Larae Grooms, NP   10 months ago Essential hypertension   [place] [facility] Larae Grooms, NP   1 year ago Pneumonia of left lower lobe due to infectious organism   [place] [facility] Mecum, Oswaldo Conroy, PA-C   1 year ago Pneumonia of left lower lobe due to infectious organism   [place] [facility] [place], Megan P, DO   1 year ago Moderate persistent asthma with acute exacerbation   [place] [facility] Mecum, Erin E, PA-C              Passed - CBC within normal limits and completed in the last 12 months    WBC  Date Value Ref Range Status  05/04/2022 7.0 3.4 - 10.8 x10E3/uL Final  09/18/2017 8.8 3.6 - 11.0 K/uL Final   RBC  Date Value Ref Range Status  05/04/2022 4.38 3.77 - 5.28 x10E6/uL Final  03/22/2021 4.1 3.87 - 5.11 Final   Hemoglobin  Date Value Ref Range Status  05/04/2022 13.7 11.1 - 15.9 g/dL Final   Hematocrit  Date Value Ref Range Status  05/04/2022 41.4 34.0 - 46.6 % Final   MCHC  Date Value Ref Range  Status  05/04/2022 33.1 31.5 - 35.7 g/dL Final  16/10/9602 54.0 32.0 - 36.0 g/dL Final   [facility]  Date Value Ref Range Status  05/04/2022 31.3 26.6 - 33.0 pg Final  09/18/2017 31.6 26.0 - 34.0 pg Final   MCV  Date Value Ref Range Status  05/04/2022 95 79 - 97 fL Final   No results found for: "PLTCOUNTKUC", "LABPLAT", "POCPLA" RDW  Date Value Ref Range Status  05/04/2022 12.9 11.7 - 15.4 % Final         Passed - CMP within normal limits and completed in the last 12 months    Albumin  Date Value Ref Range Status  11/03/2022 4.3 3.9 - 4.9 g/dL Final   Alkaline Phosphatase  Date Value Ref Range Status  11/03/2022 83 44 - 121 IU/L Final  ALT  Date Value Ref Range Status  11/03/2022 31 0 - 32 IU/L Final   AST  Date Value Ref Range Status  11/03/2022 27 0 - 40 IU/L Final   BUN  Date Value Ref Range Status  11/03/2022 12 8 - 27 mg/dL Final   Calcium  Date Value Ref Range Status  11/03/2022 9.4 8.7 - 10.3 mg/dL Final   CO2  Date Value Ref Range Status  11/03/2022 22 20 - 29 mmol/L Final   Creatinine, Ser  Date Value Ref Range Status  11/03/2022 0.90 0.57 - 1.00 mg/dL Final   Glucose  Date Value Ref Range Status  11/03/2022 117 (H) 70 - 99 mg/dL Final  04/54/0981 191  Final   Glucose, Bld  Date Value Ref Range Status  09/18/2017 99 70 - 99 mg/dL Final   Potassium  Date Value Ref Range Status  11/03/2022 4.0 3.5 - 5.2 mmol/L Final   Sodium  Date Value Ref Range Status  11/03/2022 141 134 - 144 mmol/L Final   Bilirubin Total  Date Value Ref Range Status  11/03/2022 0.8 0.0 - 1.2 mg/dL Final   Bilirubin, Direct  Date Value Ref Range Status  09/18/2017 0.1 0.0 - 0.2 mg/dL Final   Indirect Bilirubin  Date Value Ref Range Status  09/18/2017 0.7 0.3 - 0.9 mg/dL Final    Comment:    Performed at [facility], [address] [place], [place] [address]   Protein,UA  Date Value Ref Range Status  05/04/2022 Negative Negative/Trace Final    Total Protein  Date Value Ref Range Status  11/03/2022 6.8 6.0 - 8.5 g/dL Final   GFR calc Af Amer  Date Value Ref Range Status  07/15/2019 59 (L) >59 mL/min/1.73 Final    Comment:    **[facility] currently reports eGFR in compliance with the current**   recommendations of the [employer]. [facility] will   update reporting as new guidelines are published from the NKF-ASN   Task force.    eGFR  Date Value Ref Range Status  11/03/2022 71 >59 mL/min/1.73 Final   GFR calc non Af Amer  Date Value Ref Range Status  07/15/2019 51 (L) >59 mL/min/1.73 Final

## 2023-03-14 ENCOUNTER — Other Ambulatory Visit: Payer: Self-pay | Admitting: Nurse Practitioner

## 2023-03-15 NOTE — Telephone Encounter (Signed)
 Requested Prescriptions  Pending Prescriptions Disp Refills   simvastatin (ZOCOR) 40 MG tablet [Pharmacy Med Name: Simvastatin 40 MG Oral Tablet] 100 tablet 2    Sig: TAKE 1 TABLET BY MOUTH DAILY     Cardiovascular:  Antilipid - Statins Failed - 03/15/2023  2:48 PM      Failed - Lipid Panel in normal range within the last 12 months    Cholesterol, Total  Date Value Ref Range Status  11/03/2022 169 100 - 199 mg/dL Final   LDL Chol Calc (NIH)  Date Value Ref Range Status  11/03/2022 97 0 - 99 mg/dL Final   HDL  Date Value Ref Range Status  11/03/2022 38 (L) >39 mg/dL Final   Triglycerides  Date Value Ref Range Status  11/03/2022 194 (H) 0 - 149 mg/dL Final         Passed - Patient is not pregnant      Passed - Valid encounter within last 12 months    Recent Outpatient Visits           4 months ago Stage 3 chronic kidney disease, unspecified whether stage 3a or 3b CKD (HCC)   [place] [facility] Larae Grooms, NP   10 months ago Essential hypertension   [place] [facility] Larae Grooms, NP   1 year ago Pneumonia of left lower lobe due to infectious organism   [place] [facility] Mecum, Oswaldo Conroy, PA-C   1 year ago Pneumonia of left lower lobe due to infectious organism   [place] [facility] [place], Megan P, DO   1 year ago Moderate persistent asthma with acute exacerbation   [place] [facility] Mecum, Oswaldo Conroy, PA-C

## 2023-03-23 ENCOUNTER — Ambulatory Visit: Payer: Medicare Other

## 2023-03-23 VITALS — Ht 68.0 in | Wt 230.0 lb

## 2023-03-23 DIAGNOSIS — Z0000 Encounter for general adult medical examination without abnormal findings: Secondary | ICD-10-CM

## 2023-03-23 NOTE — Patient Instructions (Addendum)
 Kayla Wright , Thank you for taking time to come for your Medicare Wellness Visit. I appreciate your ongoing commitment to your health goals. Please review the following plan we discussed and let me know if I can assist you in the future.   Referrals/Orders/Follow-Ups/Clinician Recommendations: Get the shingles vaccines at your local pharmacy. Get a routine eye exam every 2 years. I have included a list of eye doctors in your area. Get diagnotic mammogram of your left breast in July 2025.  This is a list of the screening recommended for you and due dates:  Health Maintenance  Topic Date Due   Zoster (Shingles) Vaccine (1 of 2) Never done   COVID-19 Vaccine (4 - 2024-25 season) 12/29/2022   Mammogram  02/08/2024   Medicare Annual Wellness Visit  03/22/2024   DTaP/Tdap/Td vaccine (3 - Td or Tdap) 01/26/2026   DEXA scan (bone density measurement)  12/15/2026   Colon Cancer Screening  09/01/2027   Pneumonia Vaccine  Completed   Flu Shot  Completed   Hepatitis C Screening  Completed   HPV Vaccine  Aged Out    Advanced directives: (Copy Requested) Please bring a copy of your health care power of attorney and living will to the office to be added to your chart at your convenience.  Next Medicare Annual Wellness Visit scheduled for next year: Yes, 04/04/24 @ 8:40am (phone visit)

## 2023-03-23 NOTE — Progress Notes (Signed)
 Subjective:   Kayla Wright is a 68 y.o. who presents for a Medicare Wellness preventive visit.  Visit Complete: Virtual I connected with  Kayla Wright on 03/23/23 by a audio enabled telemedicine application and verified that I am speaking with the correct person using two identifiers.  Patient Location: Home  Provider Location: Home Office  I discussed the limitations of evaluation and management by telemedicine. The patient expressed understanding and agreed to proceed.  Vital Signs: Because this visit was a virtual/telehealth visit, some criteria may be missing or patient reported. Any vitals not documented were not able to be obtained and vitals that have been documented are patient reported.  VideoDeclined- This patient declined Librarian, academic. Therefore the visit was completed with audio only.  AWV Questionnaire: No: Patient Medicare AWV questionnaire was not completed prior to this visit.  Cardiac Risk Factors include: advanced age (>51men, >21 women);hypertension;obesity (BMI >30kg/m2);dyslipidemia;Other (see comment), Risk factor comments: OSA (cpap)     Objective:    Today's Vitals   03/23/23 0841  Weight: 230 lb (104.3 kg)  Height: 5\' 8"  (1.727 m)  PainSc: 7    Body mass index is 34.97 kg/m.     03/23/2023    8:52 AM 03/07/2022    9:16 AM 12/23/2020   10:35 AM 12/23/2019   10:46 AM 08/31/2017    8:55 AM 12/06/2016    1:21 PM 11/09/2016    2:07 PM  Advanced Directives  Does Patient Have a Medical Advance Directive? Yes No No Yes Yes Yes Yes  Type of Estate agent of [place];Living will   Healthcare Power of [place];Living will Healthcare Power of [place];Living will Living will Living will  Does patient want to make changes to medical advance directive? No - Patient declined     --   Copy of Healthcare Power of Attorney in Chart? No - copy requested   No - copy requested     Would patient like information on  creating a medical advance directive?  No - Patient declined No - Patient declined        Current Medications (verified) Outpatient Encounter Medications as of 03/23/2023  Medication Sig   albuterol (VENTOLIN HFA) 108 (90 Base) MCG/ACT inhaler USE 1 INHALATION BY MOUTH EVERY  6 HOURS AS NEEDED   Ascorbic Acid (VITAMIN C) 1000 MG tablet Take 1 tablet (1,000 mg total) by mouth daily.   budesonide-formoterol (SYMBICORT) 160-4.5 MCG/ACT inhaler USE 2 INHALATIONS BY MOUTH TWICE DAILY   Cholecalciferol (VITAMIN D3) 125 MCG (5000 UT) CAPS Take 1 capsule (5,000 Units total) by mouth daily.   cloNIDine (CATAPRES) 0.2 MG tablet Take 1 tablet (0.2 mg total) by mouth 3 (three) times daily.   Cyanocobalamin (VITAMIN B-12 PO) Take 1 tablet by mouth daily.   FLUoxetine (PROZAC) 20 MG capsule TAKE 1 CAPSULE BY MOUTH DAILY  WITH A 40 MG CAPSULE FOR TOTAL  60 MG DAILY   FLUoxetine (PROZAC) 40 MG capsule Take 1 capsule (40 mg total) by mouth daily.   hydrOXYzine (ATARAX) 25 MG tablet Take 1 tablet (25 mg total) by mouth at bedtime as needed.   levothyroxine (SYNTHROID) 75 MCG tablet TAKE 1 TABLET BY MOUTH DAILY   methocarbamol (ROBAXIN) 500 MG tablet Take 500 mg by mouth 3 (three) times daily.   naloxone (NARCAN) nasal spray 4 mg/0.1 mL Place 1 spray into the nose once.   nystatin cream (MYCOSTATIN) Apply 1 Application topically 2 (two) times daily.  oxyCODONE (OXY IR/ROXICODONE) 5 MG immediate release tablet Take 5 mg by mouth 4 (four) times daily as needed.   QUEtiapine Fumarate (SEROQUEL XR) 150 MG 24 hr tablet Take 150 mg by mouth at bedtime.   simvastatin (ZOCOR) 40 MG tablet Take 1 tablet (40 mg total) by mouth daily.   No facility-administered encounter medications on file as of 03/23/2023.    Allergies (verified) Patient has no known allergies.   History: Past Medical History:  Diagnosis Date   Allergy    Common migraine    Depression    GERD (gastroesophageal reflux disease)     Hyperlipidemia    Hypertension    Insomnia    Menopausal symptom    Osteopenia    Thyroid disease    Past Surgical History:  Procedure Laterality Date   ANKLE FRACTURE SURGERY  06/18/15   COLONOSCOPY WITH PROPOFOL N/A 08/31/2017   Procedure: COLONOSCOPY WITH PROPOFOL;  Surgeon: Toney Reil, MD;  Location: [facility] ENDOSCOPY;  Service: Gastroenterology;  Laterality: N/A;   TUBAL LIGATION     Family History  Problem Relation Age of Onset   Diabetes Mother    Heart disease Mother    Cirrhosis Mother    Heart disease Father    Social History   Socioeconomic History   Marital status: Widowed    Spouse name: Not on file   Number of children: 1   Years of education: Not on file   Highest education level: Not on file  Occupational History   Occupation: retired  Tobacco Use   Smoking status: Never   Smokeless tobacco: Never  Vaping Use   Vaping status: Never Used  Substance and Sexual Activity   Alcohol use: No    Alcohol/week: 0.0 standard drinks of alcohol   Drug use: Yes    Types: Oxycodone   Sexual activity: Not Currently    Birth control/protection: Surgical  Other Topics Concern   Not on file  Social History Narrative   Not on file   Social Drivers of Health   Financial Resource Strain: Low Risk  (03/23/2023)   Overall Financial Resource Strain (CARDIA)    Difficulty of Paying Living Expenses: Not hard at all  Food Insecurity: No Food Insecurity (03/23/2023)   Hunger Vital Sign    Worried About Running Out of Food in the Last Year: Never true    Ran Out of Food in the Last Year: Never true  Transportation Needs: No Transportation Needs (03/23/2023)   PRAPARE - Administrator, Civil Service (Medical): No    Lack of Transportation (Non-Medical): No  Physical Activity: Insufficiently Active (03/23/2023)   Exercise Vital Sign    Days of Exercise per Week: 3 days    Minutes of Exercise per Session: 20 min  Stress: No Stress Concern Present (03/23/2023)    [employer] of Occupational Health - Occupational Stress Questionnaire    Feeling of Stress : Not at all  Social Connections: Moderately Isolated (03/23/2023)   Social Connection and Isolation Panel [NHANES]    Frequency of Communication with Friends and Family: More than three times a week    Frequency of Social Gatherings with Friends and Family: Three times a week    Attends Religious Services: More than 4 times per year    Active Member of Clubs or Organizations: No    Attends Banker Meetings: Never    Marital Status: Widowed    Tobacco Counseling Counseling given: Not Answered  Clinical Intake:  Pre-visit preparation completed: Yes  Pain : 0-10 Pain Score: 7  Pain Type: Chronic pain Pain Location: Heel Pain Orientation: Left Pain Descriptors / Indicators: Aching     BMI - recorded: 34.97 Nutritional Status: BMI > 30  Obese Nutritional Risks: None Diabetes: No  How often do you need to have someone help you when you read instructions, pamphlets, or other written materials from your doctor or pharmacy?: 1 - Never  Interpreter Needed?: No  Information entered by :: Tora Kindred, CMA   Activities of Daily Living     03/23/2023    8:43 AM  In your present state of health, do you have any difficulty performing the following activities:  Hearing? 0  Vision? 0  Difficulty concentrating or making decisions? 0  Walking or climbing stairs? 1  Comment uses a cane  Dressing or bathing? 0  Doing errands, shopping? 0  Preparing Food and eating ? N  Using the Toilet? N  In the past six months, have you accidently leaked urine? N  Do you have problems with loss of bowel control? N  Managing your Medications? N  Managing your Finances? N  Housekeeping or managing your Housekeeping? N    Patient Care Team: Larae Grooms, NP as PCP - General  Indicate any recent Medical Services you may have received from other than [facility] providers in the  past year (date may be approximate).     Assessment:   This is a routine wellness examination for [place].  Hearing/Vision screen Hearing Screening - Comments:: Denies hearing loss Vision Screening - Comments:: Needs eye exam, list of local doctors included with AVS   Goals Addressed               This Visit's Progress     Weight (lb) < 200 lb (90.7 kg) (pt-stated)   230 lb (104.3 kg)      Depression Screen     03/23/2023    8:50 AM 11/03/2022    3:05 PM 05/04/2022   11:01 AM 03/07/2022    9:14 AM 11/01/2021    2:33 PM 10/19/2021    2:54 PM 07/13/2021    2:30 PM  PHQ 2/9 Scores  PHQ - 2 Score 1 4 3  0 6 5 3   PHQ- 9 Score  15 15 0 22 18 13     Fall Risk     03/23/2023    8:53 AM 05/04/2022   11:00 AM 03/07/2022    9:16 AM 11/01/2021    2:33 PM 10/19/2021    2:53 PM  Fall Risk   Falls in the past year? 0 1 0 0 0  Number falls in past yr: 0 1 0 0 0  Injury with Fall? 0 0 0 0 0  Risk for fall due to : No Fall Risks History of fall(s) No Fall Risks No Fall Risks No Fall Risks  Follow up Falls prevention discussed;Falls evaluation completed Falls evaluation completed Falls prevention discussed;Falls evaluation completed Falls evaluation completed Falls evaluation completed    MEDICARE RISK AT HOME:  Medicare Risk at Home Any stairs in or around the home?: Yes If so, are there any without handrails?: No Home free of loose throw rugs in walkways, pet beds, electrical cords, etc?: Yes Adequate lighting in your home to reduce risk of falls?: Yes Life alert?: No Use of a cane, walker or w/c?: Yes (cane) Grab bars in the bathroom?: Yes Shower chair or bench in shower?: Yes Elevated toilet seat  or a handicapped toilet?: No  TIMED UP AND GO:  Was the test performed?  No  Cognitive Function: 6CIT completed        03/23/2023    8:54 AM 03/07/2022    9:17 AM 12/23/2020   10:39 AM 12/23/2019   10:50 AM  6CIT Screen  What Year? 0 points 0 points 0 points 0 points  What  month? 0 points 0 points 0 points 0 points  What time? 0 points 0 points 0 points 0 points  Count back from 20 0 points 0 points 0 points 0 points  Months in reverse 4 points 4 points 4 points 4 points  Repeat phrase 0 points 2 points 0 points 6 points  Total Score 4 points 6 points 4 points 10 points    Immunizations Immunization History  Administered Date(s) Administered   Fluad Quad(high Dose 65+) 10/19/2021   Fluad Trivalent(High Dose 65+) 11/03/2022   Influenza,inj,Quad PF,6+ Mos 11/04/2014, 11/02/2015, 11/15/2016, 10/23/2017, 11/21/2018, 11/27/2019   Influenza-Unspecified 11/06/2020   PFIZER(Purple Top)SARS-COV-2 Vaccination 10/07/2019, 10/28/2019   PNEUMOCOCCAL CONJUGATE-20 10/19/2021   Pfizer(Comirnaty)Fall Seasonal Vaccine 12 years and older 11/03/2022   Td 04/23/2004   Tdap 01/27/2016    Screening Tests Health Maintenance  Topic Date Due   Zoster Vaccines- Shingrix (1 of 2) Never done   COVID-19 Vaccine (4 - 2024-25 season) 12/29/2022   Medicare Annual Wellness (AWV)  03/22/2024   MAMMOGRAM  02/07/2025   DTaP/Tdap/Td (3 - Td or Tdap) 01/26/2026   DEXA SCAN  12/15/2026   Colonoscopy  09/01/2027   Pneumonia Vaccine 70+ Years old  Completed   INFLUENZA VACCINE  Completed   Hepatitis C Screening  Completed   HPV VACCINES  Aged Out    Health Maintenance  Health Maintenance Due  Topic Date Due   Zoster Vaccines- Shingrix (1 of 2) Never done   COVID-19 Vaccine (4 - 2024-25 season) 12/29/2022   Health Maintenance Items Addressed: See Nurse Notes  Additional Screening:  Vision Screening: Recommended annual ophthalmology exams for early detection of glaucoma and other disorders of the eye.  Dental Screening: Recommended annual dental exams for proper oral hygiene  Community Resource Referral / Chronic Care Management: CRR required this visit?  No   CCM required this visit?  No     Plan:     I have personally reviewed and noted the following in the  patient's chart:   Medical and social history Use of alcohol, tobacco or illicit drugs  Current medications and supplements including opioid prescriptions. Patient is currently taking opioid prescriptions. Information provided to patient regarding non-opioid alternatives. Patient advised to discuss non-opioid treatment plan with their provider. Functional ability and status Nutritional status Physical activity Advanced directives List of other physicians Hospitalizations, surgeries, and ER visits in previous 12 months Vitals Screenings to include cognitive, depression, and falls Referrals and appointments  In addition, I have reviewed and discussed with patient certain preventive protocols, quality metrics, and best practice recommendations. A written personalized care plan for preventive services as well as general preventive health recommendations were provided to patient.     Tora Kindred, CMA   03/23/2023   After Visit Summary: (Mail) Due to this being a telephonic visit, the after visit summary with patients personalized plan was offered to patient via mail   Notes:  6 CIT Score - 4 Needs shingles vaccine Needs eye exam. Included list of local doctors in the area. Will need Diag MMG of L breast ~07/2023

## 2023-03-28 ENCOUNTER — Other Ambulatory Visit: Payer: Self-pay | Admitting: Nurse Practitioner

## 2023-03-29 ENCOUNTER — Other Ambulatory Visit: Payer: Self-pay | Admitting: Nurse Practitioner

## 2023-03-29 NOTE — Telephone Encounter (Signed)
 1-year supply not appropriate at this time  Requested Prescriptions  Pending Prescriptions Disp Refills   simvastatin (ZOCOR) 40 MG tablet [Pharmacy Med Name: Simvastatin 40 MG Oral Tablet] 100 tablet 2    Sig: TAKE 1 TABLET BY MOUTH DAILY     Cardiovascular:  Antilipid - Statins Failed - 03/29/2023  2:36 PM      Failed - Lipid Panel in normal range within the last 12 months    Cholesterol, Total  Date Value Ref Range Status  11/03/2022 169 100 - 199 mg/dL Final   LDL Chol Calc (NIH)  Date Value Ref Range Status  11/03/2022 97 0 - 99 mg/dL Final   HDL  Date Value Ref Range Status  11/03/2022 38 (L) >39 mg/dL Final   Triglycerides  Date Value Ref Range Status  11/03/2022 194 (H) 0 - 149 mg/dL Final         Passed - Patient is not pregnant      Passed - Valid encounter within last 12 months    Recent Outpatient Visits           4 months ago Stage 3 chronic kidney disease, unspecified whether stage 3a or 3b CKD (HCC)   [place] [facility] Larae Grooms, NP   10 months ago Essential hypertension   [facility] Larae Grooms, NP   1 year ago Pneumonia of left lower lobe due to infectious organism   [place] [facility] Mecum, Oswaldo Conroy, PA-C   1 year ago Pneumonia of left lower lobe due to infectious organism   [place] [facility] [place], Megan P, DO   1 year ago Moderate persistent asthma with acute exacerbation   [facility] Mecum, Oswaldo Conroy, PA-C

## 2023-03-29 NOTE — Telephone Encounter (Signed)
 Requested medication (s) are due for refill today - unknown  Requested medication (s) are on the active medication list -yes  Future visit scheduled -no  Last refill: 01/15/23  Notes to clinic: non delegated Rx- listed as historical medication   Requested Prescriptions  Pending Prescriptions Disp Refills   QUEtiapine Fumarate (SEROQUEL XR) 150 MG 24 hr tablet [Pharmacy Med Name: QUETIAPINE  150MG   TAB  XR] 90 tablet 3    Sig: TAKE 1 TABLET BY MOUTH AT  BEDTIME     Not Delegated - Psychiatry:  Antipsychotics - Second Generation (Atypical) - quetiapine Failed - 03/29/2023  9:55 AM      Failed - This refill cannot be delegated      Failed - TSH in normal range and within 360 days    TSH  Date Value Ref Range Status  11/03/2022 5.210 (H) 0.450 - 4.500 uIU/mL Final         Failed - Lipid Panel in normal range within the last 12 months    Cholesterol, Total  Date Value Ref Range Status  11/03/2022 169 100 - 199 mg/dL Final   LDL Chol Calc (NIH)  Date Value Ref Range Status  11/03/2022 97 0 - 99 mg/dL Final   HDL  Date Value Ref Range Status  11/03/2022 38 (L) >39 mg/dL Final   Triglycerides  Date Value Ref Range Status  11/03/2022 194 (H) 0 - 149 mg/dL Final         Passed - Completed PHQ-2 or PHQ-9 in the last 360 days      Passed - Last BP in normal range    BP Readings from Last 1 Encounters:  11/07/22 133/64         Passed - Last Heart Rate in normal range    Pulse Readings from Last 1 Encounters:  11/07/22 61         Passed - Valid encounter within last 6 months    Recent Outpatient Visits           4 months ago Stage 3 chronic kidney disease, unspecified whether stage 3a or 3b CKD (HCC)   [place] [facility] Larae Grooms, NP   10 months ago Essential hypertension   [facility] [place], Clydie Braun, NP   1 year ago Pneumonia of left lower lobe due to infectious organism   [place] [facility]  Mecum, Oswaldo Conroy, PA-C   1 year ago Pneumonia of left lower lobe due to infectious organism   [place] [facility] [place], Megan P, DO   1 year ago Moderate persistent asthma with acute exacerbation   [facility] Mecum, Erin E, PA-C              Passed - CBC within normal limits and completed in the last 12 months    WBC  Date Value Ref Range Status  05/04/2022 7.0 3.4 - 10.8 x10E3/uL Final  09/18/2017 8.8 3.6 - 11.0 K/uL Final   RBC  Date Value Ref Range Status  05/04/2022 4.38 3.77 - 5.28 x10E6/uL Final  03/22/2021 4.1 3.87 - 5.11 Final   Hemoglobin  Date Value Ref Range Status  05/04/2022 13.7 11.1 - 15.9 g/dL Final   Hematocrit  Date Value Ref Range Status  05/04/2022 41.4 34.0 - 46.6 % Final   MCHC  Date Value Ref Range Status  05/04/2022 33.1 31.5 - 35.7 g/dL Final  16/10/9602 54.0 32.0 - 36.0 g/dL  Final   MCH  Date Value Ref Range Status  05/04/2022 31.3 26.6 - 33.0 pg Final  09/18/2017 31.6 26.0 - 34.0 pg Final   MCV  Date Value Ref Range Status  05/04/2022 95 79 - 97 fL Final   No results found for: "PLTCOUNTKUC", "LABPLAT", "POCPLA" RDW  Date Value Ref Range Status  05/04/2022 12.9 11.7 - 15.4 % Final         Passed - CMP within normal limits and completed in the last 12 months    Albumin  Date Value Ref Range Status  11/03/2022 4.3 3.9 - 4.9 g/dL Final   Alkaline Phosphatase  Date Value Ref Range Status  11/03/2022 83 44 - 121 IU/L Final   ALT  Date Value Ref Range Status  11/03/2022 31 0 - 32 IU/L Final   AST  Date Value Ref Range Status  11/03/2022 27 0 - 40 IU/L Final   BUN  Date Value Ref Range Status  11/03/2022 12 8 - 27 mg/dL Final   Calcium  Date Value Ref Range Status  11/03/2022 9.4 8.7 - 10.3 mg/dL Final   CO2  Date Value Ref Range Status  11/03/2022 22 20 - 29 mmol/L Final   Creatinine, Ser  Date Value Ref Range Status  11/03/2022 0.90 0.57 - 1.00 mg/dL Final   Glucose   Date Value Ref Range Status  11/03/2022 117 (H) 70 - 99 mg/dL Final  09/60/4540 981  Final   Glucose, Bld  Date Value Ref Range Status  09/18/2017 99 70 - 99 mg/dL Final   Potassium  Date Value Ref Range Status  11/03/2022 4.0 3.5 - 5.2 mmol/L Final   Sodium  Date Value Ref Range Status  11/03/2022 141 134 - 144 mmol/L Final   Bilirubin Total  Date Value Ref Range Status  11/03/2022 0.8 0.0 - 1.2 mg/dL Final   Bilirubin, Direct  Date Value Ref Range Status  09/18/2017 0.1 0.0 - 0.2 mg/dL Final   Indirect Bilirubin  Date Value Ref Range Status  09/18/2017 0.7 0.3 - 0.9 mg/dL Final    Comment:    Performed at [facility], [address] [place], [place] [address]   Protein,UA  Date Value Ref Range Status  05/04/2022 Negative Negative/Trace Final   Total Protein  Date Value Ref Range Status  11/03/2022 6.8 6.0 - 8.5 g/dL Final   GFR calc Af Amer  Date Value Ref Range Status  07/15/2019 59 (L) >59 mL/min/1.73 Final    Comment:    **[facility] currently reports eGFR in compliance with the current**   recommendations of the [employer]. [facility] will   update reporting as new guidelines are published from the NKF-ASN   Task force.    eGFR  Date Value Ref Range Status  11/03/2022 71 >59 mL/min/1.73 Final   GFR calc non Af Amer  Date Value Ref Range Status  07/15/2019 51 (L) >59 mL/min/1.73 Final            Requested Prescriptions  Pending Prescriptions Disp Refills   QUEtiapine Fumarate (SEROQUEL XR) 150 MG 24 hr tablet [Pharmacy Med Name: QUETIAPINE  150MG   TAB  XR] 90 tablet 3    Sig: TAKE 1 TABLET BY MOUTH AT  BEDTIME     Not Delegated - Psychiatry:  Antipsychotics - Second Generation (Atypical) - quetiapine Failed - 03/29/2023  9:55 AM      Failed - This refill cannot be delegated      Failed -  TSH in normal range and within 360 days    TSH  Date Value Ref Range Status  11/03/2022 5.210 (H) 0.450 - 4.500 uIU/mL Final          Failed - Lipid Panel in normal range within the last 12 months    Cholesterol, Total  Date Value Ref Range Status  11/03/2022 169 100 - 199 mg/dL Final   LDL Chol Calc (NIH)  Date Value Ref Range Status  11/03/2022 97 0 - 99 mg/dL Final   HDL  Date Value Ref Range Status  11/03/2022 38 (L) >39 mg/dL Final   Triglycerides  Date Value Ref Range Status  11/03/2022 194 (H) 0 - 149 mg/dL Final         Passed - Completed PHQ-2 or PHQ-9 in the last 360 days      Passed - Last BP in normal range    BP Readings from Last 1 Encounters:  11/07/22 133/64         Passed - Last Heart Rate in normal range    Pulse Readings from Last 1 Encounters:  11/07/22 61         Passed - Valid encounter within last 6 months    Recent Outpatient Visits           4 months ago Stage 3 chronic kidney disease, unspecified whether stage 3a or 3b CKD (HCC)   [place] [facility] Larae Grooms, NP   10 months ago Essential hypertension   [place] [facility] Larae Grooms, NP   1 year ago Pneumonia of left lower lobe due to infectious organism   [place] [facility] Mecum, Oswaldo Conroy, PA-C   1 year ago Pneumonia of left lower lobe due to infectious organism   [place] [facility] [employer], Megan P, DO   1 year ago Moderate persistent asthma with acute exacerbation   [place] [facility] Mecum, Erin E, PA-C              Passed - CBC within normal limits and completed in the last 12 months    WBC  Date Value Ref Range Status  05/04/2022 7.0 3.4 - 10.8 x10E3/uL Final  09/18/2017 8.8 3.6 - 11.0 K/uL Final   RBC  Date Value Ref Range Status  05/04/2022 4.38 3.77 - 5.28 x10E6/uL Final  03/22/2021 4.1 3.87 - 5.11 Final   Hemoglobin  Date Value Ref Range Status  05/04/2022 13.7 11.1 - 15.9 g/dL Final   Hematocrit  Date Value Ref Range Status  05/04/2022 41.4 34.0 - 46.6 % Final   MCHC  Date Value Ref  Range Status  05/04/2022 33.1 31.5 - 35.7 g/dL Final  28/41/3244 01.0 32.0 - 36.0 g/dL Final   [facility]  Date Value Ref Range Status  05/04/2022 31.3 26.6 - 33.0 pg Final  09/18/2017 31.6 26.0 - 34.0 pg Final   MCV  Date Value Ref Range Status  05/04/2022 95 79 - 97 fL Final   No results found for: "PLTCOUNTKUC", "LABPLAT", "POCPLA" RDW  Date Value Ref Range Status  05/04/2022 12.9 11.7 - 15.4 % Final         Passed - CMP within normal limits and completed in the last 12 months    Albumin  Date Value Ref Range Status  11/03/2022 4.3 3.9 - 4.9 g/dL Final   Alkaline Phosphatase  Date Value Ref Range Status  11/03/2022 83 44 - 121 IU/L Final  ALT  Date Value Ref Range Status  11/03/2022 31 0 - 32 IU/L Final   AST  Date Value Ref Range Status  11/03/2022 27 0 - 40 IU/L Final   BUN  Date Value Ref Range Status  11/03/2022 12 8 - 27 mg/dL Final   Calcium  Date Value Ref Range Status  11/03/2022 9.4 8.7 - 10.3 mg/dL Final   CO2  Date Value Ref Range Status  11/03/2022 22 20 - 29 mmol/L Final   Creatinine, Ser  Date Value Ref Range Status  11/03/2022 0.90 0.57 - 1.00 mg/dL Final   Glucose  Date Value Ref Range Status  11/03/2022 117 (H) 70 - 99 mg/dL Final  16/10/9602 540  Final   Glucose, Bld  Date Value Ref Range Status  09/18/2017 99 70 - 99 mg/dL Final   Potassium  Date Value Ref Range Status  11/03/2022 4.0 3.5 - 5.2 mmol/L Final   Sodium  Date Value Ref Range Status  11/03/2022 141 134 - 144 mmol/L Final   Bilirubin Total  Date Value Ref Range Status  11/03/2022 0.8 0.0 - 1.2 mg/dL Final   Bilirubin, Direct  Date Value Ref Range Status  09/18/2017 0.1 0.0 - 0.2 mg/dL Final   Indirect Bilirubin  Date Value Ref Range Status  09/18/2017 0.7 0.3 - 0.9 mg/dL Final    Comment:    Performed at [facility], [address] [place], [place] [address]   Protein,UA  Date Value Ref Range Status  05/04/2022 Negative Negative/Trace Final    Total Protein  Date Value Ref Range Status  11/03/2022 6.8 6.0 - 8.5 g/dL Final   GFR calc Af Amer  Date Value Ref Range Status  07/15/2019 59 (L) >59 mL/min/1.73 Final    Comment:    **[facility] currently reports eGFR in compliance with the current**   recommendations of the [employer]. [facility] will   update reporting as new guidelines are published from the NKF-ASN   Task force.    eGFR  Date Value Ref Range Status  11/03/2022 71 >59 mL/min/1.73 Final   GFR calc non Af Amer  Date Value Ref Range Status  07/15/2019 51 (L) >59 mL/min/1.73 Final

## 2023-04-10 DIAGNOSIS — Z79891 Long term (current) use of opiate analgesic: Secondary | ICD-10-CM | POA: Diagnosis not present

## 2023-04-10 DIAGNOSIS — E039 Hypothyroidism, unspecified: Secondary | ICD-10-CM | POA: Diagnosis not present

## 2023-04-10 DIAGNOSIS — M19172 Post-traumatic osteoarthritis, left ankle and foot: Secondary | ICD-10-CM | POA: Diagnosis not present

## 2023-04-10 DIAGNOSIS — M47817 Spondylosis without myelopathy or radiculopathy, lumbosacral region: Secondary | ICD-10-CM | POA: Diagnosis not present

## 2023-04-10 DIAGNOSIS — Z9181 History of falling: Secondary | ICD-10-CM | POA: Diagnosis not present

## 2023-04-10 DIAGNOSIS — M25572 Pain in left ankle and joints of left foot: Secondary | ICD-10-CM | POA: Diagnosis not present

## 2023-04-12 ENCOUNTER — Other Ambulatory Visit: Payer: Self-pay | Admitting: Nurse Practitioner

## 2023-04-13 ENCOUNTER — Telehealth: Payer: Self-pay

## 2023-04-13 ENCOUNTER — Ambulatory Visit (INDEPENDENT_AMBULATORY_CARE_PROVIDER_SITE_OTHER): Admitting: Nurse Practitioner

## 2023-04-13 ENCOUNTER — Encounter: Payer: Self-pay | Admitting: Nurse Practitioner

## 2023-04-13 DIAGNOSIS — F333 Major depressive disorder, recurrent, severe with psychotic symptoms: Secondary | ICD-10-CM

## 2023-04-13 DIAGNOSIS — G4733 Obstructive sleep apnea (adult) (pediatric): Secondary | ICD-10-CM

## 2023-04-13 DIAGNOSIS — N183 Chronic kidney disease, stage 3 unspecified: Secondary | ICD-10-CM

## 2023-04-13 DIAGNOSIS — I10 Essential (primary) hypertension: Secondary | ICD-10-CM | POA: Diagnosis not present

## 2023-04-13 DIAGNOSIS — R7301 Impaired fasting glucose: Secondary | ICD-10-CM

## 2023-04-13 DIAGNOSIS — E785 Hyperlipidemia, unspecified: Secondary | ICD-10-CM | POA: Diagnosis not present

## 2023-04-13 DIAGNOSIS — E038 Other specified hypothyroidism: Secondary | ICD-10-CM

## 2023-04-13 MED ORDER — ZEPBOUND 2.5 MG/0.5ML ~~LOC~~ SOAJ: 2.5000 mg | SUBCUTANEOUS | 0 refills | Status: DC

## 2023-04-13 NOTE — Telephone Encounter (Signed)
 PA for Zepbound initiated and submitted via Cover My Meds. Key: [id number]

## 2023-04-13 NOTE — Assessment & Plan Note (Signed)
Chronic.  Controlled.  Continue with current medication regimen of Clonidine.  Blood pressures are running 100/60s at home.  Continue to check blood pressures at home.  Refills sent today.  Labs ordered today.  Return to clinic in 6 months for reevaluation.  Call sooner if concerns arise.

## 2023-04-13 NOTE — Assessment & Plan Note (Signed)
Chronic.  Controlled.  Continue with current medication regimen of Prozac and Seroquel.  Feels like she is doing well despite PHQ9 and GAD7.  Refills sent today.  Labs ordered today.  Return to clinic in 6 months for reevaluation.  Call sooner if concerns arise.

## 2023-04-13 NOTE — Telephone Encounter (Signed)
 Requested medication (s) are due for refill today - no  Requested medication (s) are on the active medication list -yes  Future visit scheduled -yes  Last refill: 03/29/23 #30  Notes to clinic: non delegated Rx  Requested Prescriptions  Pending Prescriptions Disp Refills   QUEtiapine Fumarate (SEROQUEL XR) 150 MG 24 hr tablet [Pharmacy Med Name: QUETIAPINE  150MG   TAB  XR] 30 tablet 11    Sig: TAKE 1 TABLET BY MOUTH AT  BEDTIME     Not Delegated - Psychiatry:  Antipsychotics - Second Generation (Atypical) - quetiapine Failed - 04/13/2023  8:50 AM      Failed - This refill cannot be delegated      Failed - TSH in normal range and within 360 days    TSH  Date Value Ref Range Status  11/03/2022 5.210 (H) 0.450 - 4.500 uIU/mL Final         Failed - Lipid Panel in normal range within the last 12 months    Cholesterol, Total  Date Value Ref Range Status  11/03/2022 169 100 - 199 mg/dL Final   LDL Chol Calc (NIH)  Date Value Ref Range Status  11/03/2022 97 0 - 99 mg/dL Final   HDL  Date Value Ref Range Status  11/03/2022 38 (L) >39 mg/dL Final   Triglycerides  Date Value Ref Range Status  11/03/2022 194 (H) 0 - 149 mg/dL Final         Passed - Completed PHQ-2 or PHQ-9 in the last 360 days      Passed - Last BP in normal range    BP Readings from Last 1 Encounters:  11/07/22 133/64         Passed - Last Heart Rate in normal range    Pulse Readings from Last 1 Encounters:  11/07/22 61         Passed - Valid encounter within last 6 months    Recent Outpatient Visits           5 months ago Stage 3 chronic kidney disease, unspecified whether stage 3a or 3b CKD (HCC)   [place] [facility] Larae Grooms, NP   11 months ago Essential hypertension   [place] [facility] [place], Clydie Braun, NP   1 year ago Pneumonia of left lower lobe due to infectious organism   [place] [facility] Mecum, Oswaldo Conroy, PA-C   1 year ago  Pneumonia of left lower lobe due to infectious organism   [place] [facility] [place], Megan P, DO   1 year ago Moderate persistent asthma with acute exacerbation    [facility] Mecum, Erin E, PA-C              Passed - CBC within normal limits and completed in the last 12 months    WBC  Date Value Ref Range Status  05/04/2022 7.0 3.4 - 10.8 x10E3/uL Final  09/18/2017 8.8 3.6 - 11.0 K/uL Final   RBC  Date Value Ref Range Status  05/04/2022 4.38 3.77 - 5.28 x10E6/uL Final  03/22/2021 4.1 3.87 - 5.11 Final   Hemoglobin  Date Value Ref Range Status  05/04/2022 13.7 11.1 - 15.9 g/dL Final   Hematocrit  Date Value Ref Range Status  05/04/2022 41.4 34.0 - 46.6 % Final   MCHC  Date Value Ref Range Status  05/04/2022 33.1 31.5 - 35.7 g/dL Final  65/78/4696 29.5 32.0 - 36.0 g/dL Final   [facility]  Date Value Ref Range Status  05/04/2022 31.3 26.6 - 33.0 pg Final  09/18/2017 31.6 26.0 - 34.0 pg Final   MCV  Date Value Ref Range Status  05/04/2022 95 79 - 97 fL Final   No results found for: "PLTCOUNTKUC", "LABPLAT", "POCPLA" RDW  Date Value Ref Range Status  05/04/2022 12.9 11.7 - 15.4 % Final         Passed - CMP within normal limits and completed in the last 12 months    Albumin  Date Value Ref Range Status  11/03/2022 4.3 3.9 - 4.9 g/dL Final   Alkaline Phosphatase  Date Value Ref Range Status  11/03/2022 83 44 - 121 IU/L Final   ALT  Date Value Ref Range Status  11/03/2022 31 0 - 32 IU/L Final   AST  Date Value Ref Range Status  11/03/2022 27 0 - 40 IU/L Final   BUN  Date Value Ref Range Status  11/03/2022 12 8 - 27 mg/dL Final   Calcium  Date Value Ref Range Status  11/03/2022 9.4 8.7 - 10.3 mg/dL Final   CO2  Date Value Ref Range Status  11/03/2022 22 20 - 29 mmol/L Final   Creatinine, Ser  Date Value Ref Range Status  11/03/2022 0.90 0.57 - 1.00 mg/dL Final   Glucose  Date Value Ref Range Status   11/03/2022 117 (H) 70 - 99 mg/dL Final  16/10/9602 540  Final   Glucose, Bld  Date Value Ref Range Status  09/18/2017 99 70 - 99 mg/dL Final   Potassium  Date Value Ref Range Status  11/03/2022 4.0 3.5 - 5.2 mmol/L Final   Sodium  Date Value Ref Range Status  11/03/2022 141 134 - 144 mmol/L Final   Bilirubin Total  Date Value Ref Range Status  11/03/2022 0.8 0.0 - 1.2 mg/dL Final   Bilirubin, Direct  Date Value Ref Range Status  09/18/2017 0.1 0.0 - 0.2 mg/dL Final   Indirect Bilirubin  Date Value Ref Range Status  09/18/2017 0.7 0.3 - 0.9 mg/dL Final    Comment:    Performed at [facility], [address] [place], [place] [address]   Protein,UA  Date Value Ref Range Status  05/04/2022 Negative Negative/Trace Final   Total Protein  Date Value Ref Range Status  11/03/2022 6.8 6.0 - 8.5 g/dL Final   GFR calc Af Amer  Date Value Ref Range Status  07/15/2019 59 (L) >59 mL/min/1.73 Final    Comment:    **[facility] currently reports eGFR in compliance with the current**   recommendations of the [employer]. [facility] will   update reporting as new guidelines are published from the NKF-ASN   Task force.    eGFR  Date Value Ref Range Status  11/03/2022 71 >59 mL/min/1.73 Final   GFR calc non Af Amer  Date Value Ref Range Status  07/15/2019 51 (L) >59 mL/min/1.73 Final            Requested Prescriptions  Pending Prescriptions Disp Refills   QUEtiapine Fumarate (SEROQUEL XR) 150 MG 24 hr tablet [Pharmacy Med Name: QUETIAPINE  150MG   TAB  XR] 30 tablet 11    Sig: TAKE 1 TABLET BY MOUTH AT  BEDTIME     Not Delegated - Psychiatry:  Antipsychotics - Second Generation (Atypical) - quetiapine Failed - 04/13/2023  8:50 AM      Failed - This refill cannot be delegated      Failed - TSH in normal range and  within 360 days    TSH  Date Value Ref Range Status  11/03/2022 5.210 (H) 0.450 - 4.500 uIU/mL Final         Failed - Lipid Panel in  normal range within the last 12 months    Cholesterol, Total  Date Value Ref Range Status  11/03/2022 169 100 - 199 mg/dL Final   LDL Chol Calc (NIH)  Date Value Ref Range Status  11/03/2022 97 0 - 99 mg/dL Final   HDL  Date Value Ref Range Status  11/03/2022 38 (L) >39 mg/dL Final   Triglycerides  Date Value Ref Range Status  11/03/2022 194 (H) 0 - 149 mg/dL Final         Passed - Completed PHQ-2 or PHQ-9 in the last 360 days      Passed - Last BP in normal range    BP Readings from Last 1 Encounters:  11/07/22 133/64         Passed - Last Heart Rate in normal range    Pulse Readings from Last 1 Encounters:  11/07/22 61         Passed - Valid encounter within last 6 months    Recent Outpatient Visits           5 months ago Stage 3 chronic kidney disease, unspecified whether stage 3a or 3b CKD (HCC)   [place] [facility] Larae Grooms, NP   11 months ago Essential hypertension   [place] [facility] Larae Grooms, NP   1 year ago Pneumonia of left lower lobe due to infectious organism   [place] [facility] Mecum, Oswaldo Conroy, PA-C   1 year ago Pneumonia of left lower lobe due to infectious organism   [place] [facility] [place], Megan P, DO   1 year ago Moderate persistent asthma with acute exacerbation   [place] [facility] Mecum, Erin E, PA-C              Passed - CBC within normal limits and completed in the last 12 months    WBC  Date Value Ref Range Status  05/04/2022 7.0 3.4 - 10.8 x10E3/uL Final  09/18/2017 8.8 3.6 - 11.0 K/uL Final   RBC  Date Value Ref Range Status  05/04/2022 4.38 3.77 - 5.28 x10E6/uL Final  03/22/2021 4.1 3.87 - 5.11 Final   Hemoglobin  Date Value Ref Range Status  05/04/2022 13.7 11.1 - 15.9 g/dL Final   Hematocrit  Date Value Ref Range Status  05/04/2022 41.4 34.0 - 46.6 % Final   MCHC  Date Value Ref Range Status  05/04/2022 33.1  31.5 - 35.7 g/dL Final  16/10/9602 54.0 32.0 - 36.0 g/dL Final   [facility]  Date Value Ref Range Status  05/04/2022 31.3 26.6 - 33.0 pg Final  09/18/2017 31.6 26.0 - 34.0 pg Final   MCV  Date Value Ref Range Status  05/04/2022 95 79 - 97 fL Final   No results found for: "PLTCOUNTKUC", "LABPLAT", "POCPLA" RDW  Date Value Ref Range Status  05/04/2022 12.9 11.7 - 15.4 % Final         Passed - CMP within normal limits and completed in the last 12 months    Albumin  Date Value Ref Range Status  11/03/2022 4.3 3.9 - 4.9 g/dL Final   Alkaline Phosphatase  Date Value Ref Range Status  11/03/2022 83 44 - 121 IU/L Final   ALT  Date Value Ref  Range Status  11/03/2022 31 0 - 32 IU/L Final   AST  Date Value Ref Range Status  11/03/2022 27 0 - 40 IU/L Final   BUN  Date Value Ref Range Status  11/03/2022 12 8 - 27 mg/dL Final   Calcium  Date Value Ref Range Status  11/03/2022 9.4 8.7 - 10.3 mg/dL Final   CO2  Date Value Ref Range Status  11/03/2022 22 20 - 29 mmol/L Final   Creatinine, Ser  Date Value Ref Range Status  11/03/2022 0.90 0.57 - 1.00 mg/dL Final   Glucose  Date Value Ref Range Status  11/03/2022 117 (H) 70 - 99 mg/dL Final  16/10/9602 540  Final   Glucose, Bld  Date Value Ref Range Status  09/18/2017 99 70 - 99 mg/dL Final   Potassium  Date Value Ref Range Status  11/03/2022 4.0 3.5 - 5.2 mmol/L Final   Sodium  Date Value Ref Range Status  11/03/2022 141 134 - 144 mmol/L Final   Bilirubin Total  Date Value Ref Range Status  11/03/2022 0.8 0.0 - 1.2 mg/dL Final   Bilirubin, Direct  Date Value Ref Range Status  09/18/2017 0.1 0.0 - 0.2 mg/dL Final   Indirect Bilirubin  Date Value Ref Range Status  09/18/2017 0.7 0.3 - 0.9 mg/dL Final    Comment:    Performed at [facility], [address] [place], [place] [address]   Protein,UA  Date Value Ref Range Status  05/04/2022 Negative Negative/Trace Final   Total Protein  Date Value  Ref Range Status  11/03/2022 6.8 6.0 - 8.5 g/dL Final   GFR calc Af Amer  Date Value Ref Range Status  07/15/2019 59 (L) >59 mL/min/1.73 Final    Comment:    **[facility] currently reports eGFR in compliance with the current**   recommendations of the [employer]. [facility] will   update reporting as new guidelines are published from the NKF-ASN   Task force.    eGFR  Date Value Ref Range Status  11/03/2022 71 >59 mL/min/1.73 Final   GFR calc non Af Amer  Date Value Ref Range Status  07/15/2019 51 (L) >59 mL/min/1.73 Final

## 2023-04-13 NOTE — Telephone Encounter (Signed)
 Copied from CRM [id number]. Topic: Clinical - Prescription Issue >> Apr 13, 2023  2:00 PM Yolanda T wrote: Reason for CRM: patient called stated [facility] is needing a PA for the script tirzepatide (ZEPBOUND) 2.5 MG/0.5ML Pen . Please f/u with patient

## 2023-04-13 NOTE — Assessment & Plan Note (Signed)
 Will start Zepbound 2.5mg  weekly.  Side effects and benefits of medication during visit today.  Discussed benefits and side effects of medication during visit today.  Patient would greatly benefit from weight reduction to help sleep apnea as well as other comorbidity.  BMI is currently 35 and qualifies for Morbid Obesity.

## 2023-04-13 NOTE — Assessment & Plan Note (Signed)
 Labs ordered at visit today.  Will make recommendations based on lab results.

## 2023-04-13 NOTE — Assessment & Plan Note (Signed)
 Recommended eating smaller high protein, low fat meals more frequently and exercising 30 mins a day 5 times a week with a goal of 10-15lb weight loss in the next 3 months.

## 2023-04-13 NOTE — Assessment & Plan Note (Signed)
Chronic.  Controlled.  Continue with current medication regimen of Simvastatin. Refills sent today.   Labs ordered today.  Return to clinic in 6 months for reevaluation.  Call sooner if concerns arise.

## 2023-04-13 NOTE — Assessment & Plan Note (Signed)
 Chronic.  Having symptoms of fatigue, weight gain, and hair loss.  Continue with current medication regimen of Levothyroxine daily.  Refills sent today.  Labs ordered today.  Will adjust medications based on results.  Return to clinic in 6 months for reevaluation.  Call sooner if concerns arise.

## 2023-04-13 NOTE — Progress Notes (Signed)
 BP 120/73 (BP Location: Left Arm, Patient Position: Sitting, Cuff Size: Large)   Pulse (!) 58   Ht 5\' 8"  (1.727 m)   Wt 236 lb 9.6 oz (107.3 kg)   SpO2 97%   BMI 35.97 kg/m    Subjective:    Patient ID: Kayla Wright, female    DOB: 03-25-1955, 68 y.o.   MRN: [id number]  HPI: Kayla Wright is a 68 y.o. female  Chief Complaint  Patient presents with   thyroid check   HYPERTENSION / HYPERLIPIDEMIA Satisfied with current treatment? yes Duration of hypertension: years BP monitoring frequency: some BP range: 100/60 BP medication side effects: no Past BP meds: clonidine Duration of hyperlipidemia: years Cholesterol medication side effects: no Cholesterol supplements: none Past cholesterol medications: simvastatin (zocor) Medication compliance: excellent compliance Aspirin: no Recent stressors: no Recurrent headaches: no Visual changes: no Palpitations: no Dyspnea: sometimes- uses Albuterol 1x every 2-3 days Chest pain: no Lower extremity edema: no Dizzy/lightheaded: no  HYPOTHYROIDISM Levothyroxine daily. Patient states she feels like her thyroid levels are off.  She is losing hair, cold all the time and gaining weight. She has gained 6 lbs in about 1 month. Thyroid control status:controlled Satisfied with current treatment? no Medication side effects: no Medication compliance: excellent compliance Etiology of hypothyroidism:  Recent dose adjustment:no Fatigue: yes Cold intolerance: no Heat intolerance: no Weight gain: no Weight loss: no Constipation: no Diarrhea/loose stools: no Palpitations: no Lower extremity edema: no Anxiety/depressed mood: no  CHRONIC KIDNEY DISEASE CKD status: controlled Medications renally dose: yes Previous renal evaluation: no Pneumovax:  Up to Date Influenza Vaccine:  Up to Date  DEPRESSION/ANXIETY She does have some down feelings but feels like the prozac is still working for her.  Denies concerns at visit today.  Taking  Fluoxetine 60mg  and Seroquel 150mg .  Denies SI.    Flowsheet Row Office Visit from 04/13/2023 in [facility]  PHQ-9 Total Score 15         04/13/2023    1:08 PM 11/03/2022    3:05 PM 05/04/2022   11:01 AM 11/01/2021    2:34 PM  GAD 7 : Generalized Anxiety Score  Nervous, Anxious, on Edge 1 2 1 2   Control/stop worrying 2 1 2 2   Worry too much - different things 2 2 2 2   Trouble relaxing 1 3 3 3   Restless 1 2 2 3   Easily annoyed or irritable 2 2 2 2   Afraid - awful might happen 1 1 2 2   Total GAD 7 Score 10 13 14 16   Anxiety Difficulty  Somewhat difficult Somewhat difficult Very difficult   SLEEP APNEA Sleep apnea status: controlled Duration: months Satisfied with current treatment?:  yes CPAP use:  yes Sleep quality with CPAP use: excellent Treament compliance:excellent compliance Last sleep study: October 2020 Treatments attempted: CPAP Wakes feeling refreshed:  no Daytime hypersomnolence:  yes Fatigue:  yes Insomnia:  yes Good sleep hygiene:  yes Difficulty falling asleep:  yes Difficulty staying asleep:  yes Snoring bothers bed partner:  no Observed apnea by bed partner: no Obesity:  yes Hypertension: yes  Pulmonary hypertension:  no Coronary artery disease:  no     No data to display           Relevant past medical, surgical, family and social history reviewed and updated as indicated. Interim medical history since our last visit reviewed. Allergies and medications reviewed and updated.  Review of Systems  Constitutional:  Positive  for fatigue and unexpected weight change. Negative for fever.  Eyes:  Negative for visual disturbance.  Respiratory:  Negative for cough, chest tightness and shortness of breath.   Cardiovascular:  Negative for chest pain, palpitations and leg swelling.  Gastrointestinal:  Negative for constipation and diarrhea.  Endocrine: Positive for cold intolerance. Negative for heat intolerance.       Hair loss   Neurological:  Negative for dizziness and headaches.  Psychiatric/Behavioral:  Negative for dysphoric mood. The patient is not nervous/anxious.     Per HPI unless specifically indicated above     Objective:    BP 120/73 (BP Location: Left Arm, Patient Position: Sitting, Cuff Size: Large)   Pulse (!) 58   Ht 5\' 8"  (1.727 m)   Wt 236 lb 9.6 oz (107.3 kg)   SpO2 97%   BMI 35.97 kg/m   Wt Readings from Last 3 Encounters:  04/13/23 236 lb 9.6 oz (107.3 kg)  03/23/23 230 lb (104.3 kg)  11/07/22 233 lb (105.7 kg)    Physical Exam Vitals and nursing note reviewed.  Constitutional:      General: She is not in acute distress.    Appearance: Normal appearance. She is obese. She is not ill-appearing, toxic-appearing or diaphoretic.  HENT:     Head: Normocephalic.     Right Ear: External ear normal.     Left Ear: External ear normal.     Nose: Nose normal.     Mouth/Throat:     Mouth: Mucous membranes are moist.     Pharynx: Oropharynx is clear.  Eyes:     General:        Right eye: No discharge.        Left eye: No discharge.     Extraocular Movements: Extraocular movements intact.     Conjunctiva/sclera: Conjunctivae normal.     Pupils: Pupils are equal, round, and reactive to light.  Cardiovascular:     Rate and Rhythm: Normal rate and regular rhythm.     Heart sounds: No murmur heard. Pulmonary:     Effort: Pulmonary effort is normal. No respiratory distress.     Breath sounds: Normal breath sounds. No wheezing or rales.  Musculoskeletal:     Cervical back: Normal range of motion and neck supple.  Skin:    General: Skin is warm and dry.     Capillary Refill: Capillary refill takes less than 2 seconds.  Neurological:     General: No focal deficit present.     Mental Status: She is alert and oriented to person, place, and time. Mental status is at baseline.  Psychiatric:        Mood and Affect: Mood normal.        Behavior: Behavior normal.        Thought Content:  Thought content normal.        Judgment: Judgment normal.     Results for orders placed or performed in visit on 11/03/22  Comp Met (CMET)   Collection Time: 11/03/22  3:33 PM  Result Value Ref Range   Glucose 117 (H) 70 - 99 mg/dL   BUN 12 8 - 27 mg/dL   Creatinine, Ser 6.57 0.57 - 1.00 mg/dL   eGFR 71 >84 ON/GEX/5.28   BUN/Creatinine Ratio 13 12 - 28   Sodium 141 134 - 144 mmol/L   Potassium 4.0 3.5 - 5.2 mmol/L   Chloride 104 96 - 106 mmol/L   CO2 22 20 - 29 mmol/L  Calcium 9.4 8.7 - 10.3 mg/dL   Total Protein 6.8 6.0 - 8.5 g/dL   Albumin 4.3 3.9 - 4.9 g/dL   Globulin, Total 2.5 1.5 - 4.5 g/dL   Bilirubin Total 0.8 0.0 - 1.2 mg/dL   Alkaline Phosphatase 83 44 - 121 IU/L   AST 27 0 - 40 IU/L   ALT 31 0 - 32 IU/L  Lipid Profile   Collection Time: 11/03/22  3:33 PM  Result Value Ref Range   Cholesterol, Total 169 100 - 199 mg/dL   Triglycerides 161 (H) 0 - 149 mg/dL   HDL 38 (L) >09 mg/dL   VLDL Cholesterol Cal 34 5 - 40 mg/dL   LDL Chol Calc (NIH) 97 0 - 99 mg/dL   Chol/HDL Ratio 4.4 0.0 - 4.4 ratio  TSH   Collection Time: 11/03/22  3:33 PM  Result Value Ref Range   TSH 5.210 (H) 0.450 - 4.500 uIU/mL  T4, free   Collection Time: 11/03/22  3:33 PM  Result Value Ref Range   Free T4 1.03 0.82 - 1.77 ng/dL  HgB U0A   Collection Time: 11/03/22  3:33 PM  Result Value Ref Range   Hgb A1c MFr Bld 6.6 (H) 4.8 - 5.6 %   Est. average glucose Bld gHb Est-mCnc 143 mg/dL  Vitamin D (25 hydroxy)   Collection Time: 11/03/22  3:33 PM  Result Value Ref Range   Vit D, 25-Hydroxy 44.4 30.0 - 100.0 ng/mL      Assessment & Plan:   Problem List Items Addressed This Visit       Cardiovascular and Mediastinum   Essential hypertension   Chronic.  Controlled.  Continue with current medication regimen of Clonidine.  Blood pressures are running 100/60s at home.  Continue to check blood pressures at home.  Refills sent today.  Labs ordered today.  Return to clinic in 6 months for  reevaluation.  Call sooner if concerns arise.       Relevant Orders   Comp Met (CMET)     Respiratory   OSA on CPAP   Will start Zepbound 2.5mg  weekly.  Side effects and benefits of medication during visit today.  Discussed benefits and side effects of medication during visit today.  Patient would greatly benefit from weight reduction to help sleep apnea as well as other comorbidity.  BMI is currently 35 and qualifies for Morbid Obesity.          Endocrine   Hypothyroidism   Chronic.  Having symptoms of fatigue, weight gain, and hair loss.  Continue with current medication regimen of Levothyroxine daily.  Refills sent today.  Labs ordered today.  Will adjust medications based on results.  Return to clinic in 6 months for reevaluation.  Call sooner if concerns arise.       Relevant Orders   TSH   T4, free   IFG (impaired fasting glucose)   Labs ordered at visit today.  Will make recommendations based on lab results.        Relevant Orders   HgB A1c     Genitourinary   CKD (chronic kidney disease) stage 3, GFR 30-59 ml/min (HCC)   Chronic.  Could benefit from SGTL2. Labs ordered at visit today.  Will make recommendations based on lab results.         Other   Hyperlipidemia   Chronic.  Controlled.  Continue with current medication regimen of Simvastatin. Refills sent today.   Labs ordered today.  Return  to clinic in 6 months for reevaluation.  Call sooner if concerns arise.       Relevant Orders   Lipid Profile   Morbid obesity (HCC) - Primary   Recommended eating smaller high protein, low fat meals more frequently and exercising 30 mins a day 5 times a week with a goal of 10-15lb weight loss in the next 3 months.        Relevant Medications   tirzepatide (ZEPBOUND) 2.5 MG/0.5ML Pen   MDD (major depressive disorder), recurrent, severe, with psychosis (HCC)   Chronic.  Controlled.  Continue with current medication regimen of Prozac and Seroquel.  Feels like she is  doing well despite PHQ9 and GAD7.  Refills sent today.  Labs ordered today.  Return to clinic in 6 months for reevaluation.  Call sooner if concerns arise.          Follow up plan: Return for Keep appt for 1 month.

## 2023-04-13 NOTE — Assessment & Plan Note (Signed)
Chronic.  Could benefit from SGTL2. Labs ordered at visit today.  Will make recommendations based on lab results.

## 2023-04-14 LAB — COMPREHENSIVE METABOLIC PANEL
ALT: 32 IU/L (ref 0–32)
AST: 32 IU/L (ref 0–40)
Albumin: 4.3 g/dL (ref 3.9–4.9)
Alkaline Phosphatase: 84 IU/L (ref 44–121)
BUN/Creatinine Ratio: 13 (ref 12–28)
BUN: 13 mg/dL (ref 8–27)
Bilirubin Total: 0.8 mg/dL (ref 0.0–1.2)
CO2: 23 mmol/L (ref 20–29)
Calcium: 9 mg/dL (ref 8.7–10.3)
Chloride: 107 mmol/L — ABNORMAL HIGH (ref 96–106)
Creatinine, Ser: 0.98 mg/dL (ref 0.57–1.00)
Globulin, Total: 2.4 g/dL (ref 1.5–4.5)
Glucose: 123 mg/dL — ABNORMAL HIGH (ref 70–99)
Potassium: 4.3 mmol/L (ref 3.5–5.2)
Sodium: 143 mmol/L (ref 134–144)
Total Protein: 6.7 g/dL (ref 6.0–8.5)
eGFR: 63 mL/min/{1.73_m2} (ref >59)

## 2023-04-14 LAB — HEMOGLOBIN A1C
Est. average glucose Bld gHb Est-mCnc: 143 mg/dL
Hgb A1c MFr Bld: 6.6 % — ABNORMAL HIGH (ref 4.8–5.6)

## 2023-04-14 LAB — LIPID PANEL
Chol/HDL Ratio: 4.2 ratio (ref 0.0–4.4)
Cholesterol, Total: 169 mg/dL (ref 100–199)
HDL: 40 mg/dL (ref >39)
LDL Chol Calc (NIH): 92 mg/dL (ref 0–99)
Triglycerides: 217 mg/dL — ABNORMAL HIGH (ref 0–149)
VLDL Cholesterol Cal: 37 mg/dL (ref 5–40)

## 2023-04-14 LAB — T4, FREE: Free T4: 1.18 ng/dL (ref 0.82–1.77)

## 2023-04-14 LAB — TSH: TSH: 2.04 u[IU]/mL (ref 0.450–4.500)

## 2023-04-19 ENCOUNTER — Other Ambulatory Visit: Payer: Self-pay | Admitting: Nurse Practitioner

## 2023-04-19 NOTE — Telephone Encounter (Signed)
 PA has been denied.

## 2023-04-19 NOTE — Telephone Encounter (Signed)
 Letter faxed along with the last office note to the patient's insurance company.

## 2023-04-19 NOTE — Telephone Encounter (Signed)
 I wrote a letter to appeal the decision.  Can you please send it?

## 2023-04-21 NOTE — Telephone Encounter (Signed)
 Requested Prescriptions  Pending Prescriptions Disp Refills   cloNIDine (CATAPRES) 0.2 MG tablet [Pharmacy Med Name: cloNIDine HCl 0.2 MG Oral Tablet] 300 tablet 0    Sig: TAKE 1 TABLET BY MOUTH 3 TIMES  DAILY     Cardiovascular:  Alpha-2 Agonists Passed - 04/21/2023  9:50 AM      Passed - Last BP in normal range    BP Readings from Last 1 Encounters:  04/13/23 120/73         Passed - Last Heart Rate in normal range    Pulse Readings from Last 1 Encounters:  04/13/23 (!) 58         Passed - Valid encounter within last 6 months    Recent Outpatient Visits           1 week ago Morbid obesity [facility])   [place] [facility] Larae Grooms, NP

## 2023-04-25 ENCOUNTER — Telehealth: Payer: Self-pay

## 2023-04-25 NOTE — Telephone Encounter (Signed)
 Copied from CRM [id number]. Topic: Clinical - Medication Question >> Apr 24, 2023  3:24 PM Yolanda T wrote: Reason for CRM: Okey Regal [facility] Appeals Dept [phone] called with medical questions about the Zepbound PA. Please f/u with Okey Regal

## 2023-04-25 NOTE — Telephone Encounter (Signed)
 Returned call to [employer]. She states that they need for [place] to fax the patient's sleep study to them at [phone] in order to process the patient's appeal.

## 2023-04-27 ENCOUNTER — Telehealth: Payer: Self-pay

## 2023-04-27 NOTE — Telephone Encounter (Signed)
 Patient's sleep study was faxed as requested.

## 2023-04-27 NOTE — Telephone Encounter (Signed)
 Please let patient know that the Zepbound is still in review.    There is also not another option if this medication is not approved.  This is currently the only FDA approved medication for OSA.

## 2023-04-27 NOTE — Telephone Encounter (Signed)
 Copied from CRM [id number]. Topic: Clinical - Prescription Issue >> Apr 27, 2023  1:38 PM Carlatta H wrote: Reason for CRM: Patient would like to know if she can be put on another medication other than zepbound//Please call patient to advise

## 2023-04-27 NOTE — Telephone Encounter (Signed)
 Called and notified patient of Karen's message. Patient verbalized understanding.

## 2023-05-03 ENCOUNTER — Other Ambulatory Visit: Payer: Self-pay | Admitting: Nurse Practitioner

## 2023-05-04 NOTE — Telephone Encounter (Signed)
 Requested Prescriptions  Pending Prescriptions Disp Refills   hydrOXYzine (ATARAX) 25 MG tablet [Pharmacy Med Name: hydrOXYzine HCl 25 MG Oral Tablet] 80 tablet 1    Sig: TAKE 1 TABLET BY MOUTH AT  BEDTIME AS NEEDED     Ear, Nose, and Throat:  Antihistamines 2 Passed - 05/04/2023 10:48 AM      Passed - Cr in normal range and within 360 days    Creatinine, Ser  Date Value Ref Range Status  04/13/2023 0.98 0.57 - 1.00 mg/dL Final         Passed - Valid encounter within last 12 months    Recent Outpatient Visits           3 weeks ago Morbid obesity [facility])   [place] [facility] Larae Grooms, NP

## 2023-05-09 DIAGNOSIS — G4733 Obstructive sleep apnea (adult) (pediatric): Secondary | ICD-10-CM | POA: Diagnosis not present

## 2023-05-10 DIAGNOSIS — E039 Hypothyroidism, unspecified: Secondary | ICD-10-CM | POA: Diagnosis not present

## 2023-05-10 DIAGNOSIS — M19172 Post-traumatic osteoarthritis, left ankle and foot: Secondary | ICD-10-CM | POA: Diagnosis not present

## 2023-05-10 DIAGNOSIS — G4733 Obstructive sleep apnea (adult) (pediatric): Secondary | ICD-10-CM | POA: Diagnosis not present

## 2023-05-10 DIAGNOSIS — M47817 Spondylosis without myelopathy or radiculopathy, lumbosacral region: Secondary | ICD-10-CM | POA: Diagnosis not present

## 2023-05-10 DIAGNOSIS — M25572 Pain in left ankle and joints of left foot: Secondary | ICD-10-CM | POA: Diagnosis not present

## 2023-05-10 DIAGNOSIS — Z79891 Long term (current) use of opiate analgesic: Secondary | ICD-10-CM | POA: Diagnosis not present

## 2023-05-10 DIAGNOSIS — Z9181 History of falling: Secondary | ICD-10-CM | POA: Diagnosis not present

## 2023-05-15 DIAGNOSIS — Z79899 Other long term (current) drug therapy: Secondary | ICD-10-CM | POA: Diagnosis not present

## 2023-05-16 ENCOUNTER — Other Ambulatory Visit: Payer: Self-pay | Admitting: Nurse Practitioner

## 2023-05-16 ENCOUNTER — Encounter: Payer: Self-pay | Admitting: Nurse Practitioner

## 2023-05-16 ENCOUNTER — Ambulatory Visit: Admitting: Nurse Practitioner

## 2023-05-16 VITALS — BP 92/60 | HR 66 | Temp 98.3°F | Resp 15 | Ht 67.99 in | Wt 232.0 lb

## 2023-05-16 DIAGNOSIS — G4730 Sleep apnea, unspecified: Secondary | ICD-10-CM

## 2023-05-16 NOTE — Progress Notes (Deleted)
   There were no vitals taken for this visit.   Subjective:    Patient ID: Kayla Wright, female    DOB: Apr 08, 1955, 68 y.o.   MRN: [id number]  HPI: Kayla Wright is a 68 y.o. female  No chief complaint on file.  SLEEP APNEA Sleep apnea status: controlled Duration: months Satisfied with current treatment?:  yes CPAP use:  yes Sleep quality with CPAP use: excellent Treament compliance:excellent compliance Last sleep study: October 2020 Treatments attempted: CPAP Wakes feeling refreshed:  no Daytime hypersomnolence:  yes Fatigue:  yes Insomnia:  yes Good sleep hygiene:  yes Difficulty falling asleep:  yes Difficulty staying asleep:  yes Snoring bothers bed partner:  no Observed apnea by bed partner: no Obesity:  yes Hypertension: yes  Pulmonary hypertension:  no Coronary artery disease:  no  Relevant past medical, surgical, family and social history reviewed and updated as indicated. Interim medical history since our last visit reviewed. Allergies and medications reviewed and updated.  Review of Systems  Per HPI unless specifically indicated above     Objective:    There were no vitals taken for this visit.  Wt Readings from Last 3 Encounters:  04/13/23 236 lb 9.6 oz (107.3 kg)  03/23/23 230 lb (104.3 kg)  11/07/22 233 lb (105.7 kg)    Physical Exam  Results for orders placed or performed in visit on 04/13/23  Comp Met (CMET)   Collection Time: 04/13/23  1:22 PM  Result Value Ref Range   Glucose 123 (H) 70 - 99 mg/dL   BUN 13 8 - 27 mg/dL   Creatinine, Ser 4.09 0.57 - 1.00 mg/dL   eGFR 63 >81 XB/JYN/8.29   BUN/Creatinine Ratio 13 12 - 28   Sodium 143 134 - 144 mmol/L   Potassium 4.3 3.5 - 5.2 mmol/L   Chloride 107 (H) 96 - 106 mmol/L   CO2 23 20 - 29 mmol/L   Calcium 9.0 8.7 - 10.3 mg/dL   Total Protein 6.7 6.0 - 8.5 g/dL   Albumin 4.3 3.9 - 4.9 g/dL   Globulin, Total 2.4 1.5 - 4.5 g/dL   Bilirubin Total 0.8 0.0 - 1.2 mg/dL   Alkaline Phosphatase 84 44  - 121 IU/L   AST 32 0 - 40 IU/L   ALT 32 0 - 32 IU/L  Lipid Profile   Collection Time: 04/13/23  1:22 PM  Result Value Ref Range   Cholesterol, Total 169 100 - 199 mg/dL   Triglycerides 562 (H) 0 - 149 mg/dL   HDL 40 >13 mg/dL   VLDL Cholesterol Cal 37 5 - 40 mg/dL   LDL Chol Calc (NIH) 92 0 - 99 mg/dL   Chol/HDL Ratio 4.2 0.0 - 4.4 ratio  HgB A1c   Collection Time: 04/13/23  1:22 PM  Result Value Ref Range   Hgb A1c MFr Bld 6.6 (H) 4.8 - 5.6 %   Est. average glucose Bld gHb Est-mCnc 143 mg/dL  TSH   Collection Time: 04/13/23  1:22 PM  Result Value Ref Range   TSH 2.040 0.450 - 4.500 uIU/mL  T4, free   Collection Time: 04/13/23  1:22 PM  Result Value Ref Range   Free T4 1.18 0.82 - 1.77 ng/dL      Assessment & Plan:   Problem List Items Addressed This Visit   None    Follow up plan: No follow-ups on file.

## 2023-05-16 NOTE — Progress Notes (Signed)
 Cancelled patient's appt due to not having received the Zepbound  yet.

## 2023-05-17 NOTE — Telephone Encounter (Signed)
 Requested medication (s) are due for refill today -yes  Requested medication (s) are on the active medication list -yes  Future visit scheduled -yes  Last refill: 04/13/23 2ml  Notes to clinic: off protocol- provider review   Requested Prescriptions  Pending Prescriptions Disp Refills   ZEPBOUND  2.5 MG/0.5ML Pen [Pharmacy Med Name: ZEPBOUND  2.5 MG/0.5ML SUBQ SOLN ML] 2 mL 0    Sig: INJECT 2.5MG  SUBCUTANEOUSLY ONCE A WEEK     Off-Protocol Failed - 05/17/2023  8:38 AM      Failed - Medication not assigned to a protocol, review manually.      Passed - Valid encounter within last 12 months    Recent Outpatient Visits           Yesterday Sleep apnea, unspecified type   [place] [facility] Aileen Alexanders, NP   1 month ago Morbid obesity [facility])   [place] [facility] Aileen Alexanders, NP                 Requested Prescriptions  Pending Prescriptions Disp Refills   ZEPBOUND  2.5 MG/0.5ML Pen [Pharmacy Med Name: ZEPBOUND  2.5 MG/0.5ML SUBQ SOLN ML] 2 mL 0    Sig: INJECT 2.5MG  SUBCUTANEOUSLY ONCE A WEEK     Off-Protocol Failed - 05/17/2023  8:38 AM      Failed - Medication not assigned to a protocol, review manually.      Passed - Valid encounter within last 12 months    Recent Outpatient Visits           Yesterday Sleep apnea, unspecified type   [place] [facility] Aileen Alexanders, NP   1 month ago Morbid obesity [facility])    [facility] Aileen Alexanders, NP

## 2023-05-27 ENCOUNTER — Other Ambulatory Visit: Payer: Self-pay | Admitting: Nurse Practitioner

## 2023-05-30 NOTE — Telephone Encounter (Signed)
 Requested Prescriptions  Pending Prescriptions Disp Refills   simvastatin  (ZOCOR ) 40 MG tablet [Pharmacy Med Name: Simvastatin  40 MG Oral Tablet] 90 tablet 2    Sig: TAKE 1 TABLET BY MOUTH DAILY     Cardiovascular:  Antilipid - Statins Failed - 05/30/2023 12:03 PM      Failed - Lipid Panel in normal range within the last 12 months    Cholesterol, Total  Date Value Ref Range Status  04/13/2023 169 100 - 199 mg/dL Final   LDL Chol Calc (NIH)  Date Value Ref Range Status  04/13/2023 92 0 - 99 mg/dL Final   HDL  Date Value Ref Range Status  04/13/2023 40 >39 mg/dL Final   Triglycerides  Date Value Ref Range Status  04/13/2023 217 (H) 0 - 149 mg/dL Final         Passed - Patient is not pregnant      Passed - Valid encounter within last 12 months    Recent Outpatient Visits           2 weeks ago Sleep apnea, unspecified type   [place] [facility] Aileen Alexanders, NP   1 month ago Morbid obesity [facility])   [place] [facility] Aileen Alexanders, NP

## 2023-06-07 DIAGNOSIS — Z9181 History of falling: Secondary | ICD-10-CM | POA: Diagnosis not present

## 2023-06-07 DIAGNOSIS — Z79891 Long term (current) use of opiate analgesic: Secondary | ICD-10-CM | POA: Diagnosis not present

## 2023-06-07 DIAGNOSIS — M25572 Pain in left ankle and joints of left foot: Secondary | ICD-10-CM | POA: Diagnosis not present

## 2023-06-07 DIAGNOSIS — M47817 Spondylosis without myelopathy or radiculopathy, lumbosacral region: Secondary | ICD-10-CM | POA: Diagnosis not present

## 2023-06-07 DIAGNOSIS — M19172 Post-traumatic osteoarthritis, left ankle and foot: Secondary | ICD-10-CM | POA: Diagnosis not present

## 2023-06-14 DIAGNOSIS — Z79899 Other long term (current) drug therapy: Secondary | ICD-10-CM | POA: Diagnosis not present

## 2023-06-20 ENCOUNTER — Encounter: Payer: Self-pay | Admitting: Nurse Practitioner

## 2023-06-20 ENCOUNTER — Ambulatory Visit (INDEPENDENT_AMBULATORY_CARE_PROVIDER_SITE_OTHER): Admitting: Nurse Practitioner

## 2023-06-20 VITALS — BP 109/66 | HR 53 | Temp 98.9°F | Resp 17 | Ht 67.99 in | Wt 231.2 lb

## 2023-06-20 DIAGNOSIS — G8929 Other chronic pain: Secondary | ICD-10-CM | POA: Diagnosis not present

## 2023-06-20 DIAGNOSIS — G4733 Obstructive sleep apnea (adult) (pediatric): Secondary | ICD-10-CM | POA: Diagnosis not present

## 2023-06-20 DIAGNOSIS — M25561 Pain in right knee: Secondary | ICD-10-CM | POA: Diagnosis not present

## 2023-06-20 MED ORDER — ALBUTEROL SULFATE HFA 108 (90 BASE) MCG/ACT IN AERS: INHALATION_SPRAY | RESPIRATORY_TRACT | 2 refills | Status: DC

## 2023-06-20 MED ORDER — TIRZEPATIDE-WEIGHT MANAGEMENT 5 MG/0.5ML ~~LOC~~ SOAJ: 5.0000 mg | SUBCUTANEOUS | 2 refills | Status: DC

## 2023-06-20 NOTE — Assessment & Plan Note (Signed)
 Chronic. Doing well with Zepbound .  Currently on 2.5mg  dose.  Has lost 5lbs since starting medication.  Will increase to 5mg  dose.  No side effects at this time.  Follow up in 3 months.  Call sooner if concerns arise.

## 2023-06-20 NOTE — Progress Notes (Signed)
 BP 109/66 (BP Location: Left Arm, Patient Position: Sitting, Cuff Size: Large)   Pulse (!) 53   Temp 98.9 F (37.2 C) (Oral)   Resp 17   Ht 5' 7.99" (1.727 m)   Wt 231 lb 3.2 oz (104.9 kg)   SpO2 97%   BMI 35.16 kg/m    Subjective:    Patient ID: Kayla Wright, female    DOB: 11-15-1955, 68 y.o.   MRN: [id number]  HPI: Kayla Wright is a 68 y.o. female  Chief Complaint  Patient presents with   Weight Management Screening    On week 4 of 2.5 mg dosing. No side effects. Does feel she is starting to eat less.    SLEEP APNEA Zepbound  Sleep apnea status: controlled Duration: months Satisfied with current treatment?:  yes CPAP use:  yes Sleep quality with CPAP use: excellent Treament compliance:excellent compliance Last sleep study: October 2020 Treatments attempted: CPAP Wakes feeling refreshed:  no Daytime hypersomnolence:  yes Fatigue:  yes Insomnia:  yes Good sleep hygiene:  yes Difficulty falling asleep:  yes Difficulty staying asleep:  yes Snoring bothers bed partner:  no Observed apnea by bed partner: no Obesity:  yes Hypertension: yes  Pulmonary hypertension:  no Coronary artery disease:  no   Patient states she is having right knee pain. Feels worse when she is going down stairs.  Is already taking Oxycodone  which doesn't really help with symptoms.  Has not had previous imaging.   Relevant past medical, surgical, family and social history reviewed and updated as indicated. Interim medical history since our last visit reviewed. Allergies and medications reviewed and updated.  Review of Systems  Constitutional:  Negative for unexpected weight change.  Musculoskeletal:  Positive for arthralgias.    Per HPI unless specifically indicated above     Objective:     BP 109/66 (BP Location: Left Arm, Patient Position: Sitting, Cuff Size: Large)   Pulse (!) 53   Temp 98.9 F (37.2 C) (Oral)   Resp 17   Ht 5' 7.99" (1.727 m)   Wt 231 lb 3.2 oz (104.9 kg)    SpO2 97%   BMI 35.16 kg/m   Wt Readings from Last 3 Encounters:  06/20/23 231 lb 3.2 oz (104.9 kg)  05/16/23 232 lb (105.2 kg)  04/13/23 236 lb 9.6 oz (107.3 kg)    Physical Exam Vitals and nursing note reviewed.  Constitutional:      General: She is not in acute distress.    Appearance: Normal appearance. She is normal weight. She is not ill-appearing, toxic-appearing or diaphoretic.  HENT:     Head: Normocephalic.     Right Ear: External ear normal.     Left Ear: External ear normal.     Nose: Nose normal.     Mouth/Throat:     Mouth: Mucous membranes are moist.     Pharynx: Oropharynx is clear.  Eyes:     General:        Right eye: No discharge.        Left eye: No discharge.     Extraocular Movements: Extraocular movements intact.     Conjunctiva/sclera: Conjunctivae normal.     Pupils: Pupils are equal, round, and reactive to light.  Cardiovascular:     Rate and Rhythm: Normal rate and regular rhythm.     Heart sounds: No murmur heard. Pulmonary:     Effort: Pulmonary effort is normal. No respiratory distress.     Breath sounds: Normal  breath sounds. No wheezing or rales.  Musculoskeletal:        General: No swelling, tenderness, deformity or signs of injury. Normal range of motion.     Cervical back: Normal range of motion and neck supple.     Right lower leg: No edema.     Left lower leg: No edema.  Skin:    General: Skin is warm and dry.     Capillary Refill: Capillary refill takes less than 2 seconds.  Neurological:     General: No focal deficit present.     Mental Status: She is alert and oriented to person, place, and time. Mental status is at baseline.  Psychiatric:        Mood and Affect: Mood normal.        Behavior: Behavior normal.        Thought Content: Thought content normal.        Judgment: Judgment normal.     Results for orders placed or performed in visit on 04/13/23  Comp Met (CMET)   Collection Time: 04/13/23  1:22 PM  Result Value  Ref Range   Glucose 123 (H) 70 - 99 mg/dL   BUN 13 8 - 27 mg/dL   Creatinine, Ser 1.61 0.57 - 1.00 mg/dL   eGFR 63 >09 UE/AVW/0.98   BUN/Creatinine Ratio 13 12 - 28   Sodium 143 134 - 144 mmol/L   Potassium 4.3 3.5 - 5.2 mmol/L   Chloride 107 (H) 96 - 106 mmol/L   CO2 23 20 - 29 mmol/L   Calcium 9.0 8.7 - 10.3 mg/dL   Total Protein 6.7 6.0 - 8.5 g/dL   Albumin 4.3 3.9 - 4.9 g/dL   Globulin, Total 2.4 1.5 - 4.5 g/dL   Bilirubin Total 0.8 0.0 - 1.2 mg/dL   Alkaline Phosphatase 84 44 - 121 IU/L   AST 32 0 - 40 IU/L   ALT 32 0 - 32 IU/L  Lipid Profile   Collection Time: 04/13/23  1:22 PM  Result Value Ref Range   Cholesterol, Total 169 100 - 199 mg/dL   Triglycerides 119 (H) 0 - 149 mg/dL   HDL 40 >14 mg/dL   VLDL Cholesterol Cal 37 5 - 40 mg/dL   LDL Chol Calc (NIH) 92 0 - 99 mg/dL   Chol/HDL Ratio 4.2 0.0 - 4.4 ratio  HgB A1c   Collection Time: 04/13/23  1:22 PM  Result Value Ref Range   Hgb A1c MFr Bld 6.6 (H) 4.8 - 5.6 %   Est. average glucose Bld gHb Est-mCnc 143 mg/dL  TSH   Collection Time: 04/13/23  1:22 PM  Result Value Ref Range   TSH 2.040 0.450 - 4.500 uIU/mL  T4, free   Collection Time: 04/13/23  1:22 PM  Result Value Ref Range   Free T4 1.18 0.82 - 1.77 ng/dL      Assessment & Plan:   Problem List Items Addressed This Visit       Respiratory   OSA on CPAP   Chronic. Doing well with Zepbound .  Currently on 2.5mg  dose.  Has lost 5lbs since starting medication.  Will increase to 5mg  dose.  No side effects at this time.  Follow up in 3 months.  Call sooner if concerns arise.       Other Visit Diagnoses       Chronic pain of right knee    -  Primary   Will obtain xray. Will evaluate for arthritis.  Can consider  joint injection if symptoms are persistent.   Relevant Orders   DG Knee Complete 4 Views Right        Follow up plan: Return in about 3 months (around 09/20/2023) for HTN, HLD, DM2 FU and Zepbound .

## 2023-06-21 ENCOUNTER — Telehealth: Payer: Self-pay

## 2023-06-21 NOTE — Telephone Encounter (Unsigned)
 Copied from CRM [id number]. Topic: Clinical - Prescription Issue >> Jun 21, 2023  1:37 PM Sophia H wrote: Reason for CRM: Pt states when she went to pick up her tirzepatide  (ZEPBOUND ) 5 MG/0.5ML Pen they gave her 2.5 MG dose instead of 5 MG, please advise. # [phone]  [facility] - [place], [place] - [address]

## 2023-06-22 MED ORDER — TIRZEPATIDE-WEIGHT MANAGEMENT 5 MG/0.5ML ~~LOC~~ SOAJ: 5.0000 mg | SUBCUTANEOUS | 2 refills | Status: DC

## 2023-06-22 NOTE — Telephone Encounter (Signed)
 RX in chart for the Zepbound  5 mg was sent to [facility]. Contacted them and they do no have this in stock. Can we resend it to [facility] for the patient please?

## 2023-06-22 NOTE — Telephone Encounter (Signed)
 Prescription sent to [facility].

## 2023-06-23 NOTE — Telephone Encounter (Signed)
 Called and notified patient that her Zepbound  5 mg has been sent to [facility] for her.

## 2023-06-24 ENCOUNTER — Other Ambulatory Visit: Payer: Self-pay | Admitting: Nurse Practitioner

## 2023-06-26 NOTE — Telephone Encounter (Signed)
 Requested Prescriptions  Pending Prescriptions Disp Refills   FLUoxetine  (PROZAC ) 40 MG capsule [Pharmacy Med Name: FLUOXETINE   40MG   CAP] 100 capsule 0    Sig: TAKE 1 CAPSULE BY MOUTH DAILY     Psychiatry:  Antidepressants - SSRI Passed - 06/26/2023  3:37 PM      Passed - Completed PHQ-2 or PHQ-9 in the last 360 days      Passed - Valid encounter within last 6 months    Recent Outpatient Visits           6 days ago Chronic pain of right knee   [place] [facility] Aileen Alexanders, NP   1 month ago Sleep apnea, unspecified type   [place] [facility] Aileen Alexanders, NP   2 months ago Morbid obesity [facility])   [place] [facility] [place], Mariah Shines, NP               FLUoxetine  (PROZAC ) 20 MG capsule [Pharmacy Med Name: FLUoxetine  HCl 20 MG Oral Capsule] 100 capsule 0    Sig: TAKE 1 CAPSULE BY MOUTH DAILY  WITH A 40 MG CAPSULE FOR TOTAL  60 MG DAILY     Psychiatry:  Antidepressants - SSRI Passed - 06/26/2023  3:37 PM      Passed - Completed PHQ-2 or PHQ-9 in the last 360 days      Passed - Valid encounter within last 6 months    Recent Outpatient Visits           6 days ago Chronic pain of right knee   [place] [facility] Aileen Alexanders, NP   1 month ago Sleep apnea, unspecified type   [place] [facility] Aileen Alexanders, NP   2 months ago Morbid obesity [facility])   [place] [facility] Aileen Alexanders, NP

## 2023-06-30 ENCOUNTER — Other Ambulatory Visit: Payer: Self-pay | Admitting: Nurse Practitioner

## 2023-07-03 NOTE — Telephone Encounter (Signed)
 Requested Prescriptions  Pending Prescriptions Disp Refills   cloNIDine  (CATAPRES ) 0.2 MG tablet [Pharmacy Med Name: cloNIDine  HCl 0.2 MG Oral Tablet] 300 tablet 1    Sig: TAKE 1 TABLET BY MOUTH 3 TIMES  DAILY     Cardiovascular:  Alpha-2 Agonists Passed - 07/03/2023  1:33 PM      Passed - Last BP in normal range    BP Readings from Last 1 Encounters:  06/20/23 109/66         Passed - Last Heart Rate in normal range    Pulse Readings from Last 1 Encounters:  06/20/23 (!) 53         Passed - Valid encounter within last 6 months    Recent Outpatient Visits           1 week ago Chronic pain of right knee   [place] [facility] Aileen Alexanders, NP   1 month ago Sleep apnea, unspecified type   [place] [facility] Aileen Alexanders, NP   2 months ago Morbid obesity [facility])   [place] [facility] Aileen Alexanders, NP

## 2023-07-04 DIAGNOSIS — Z79891 Long term (current) use of opiate analgesic: Secondary | ICD-10-CM | POA: Diagnosis not present

## 2023-07-04 DIAGNOSIS — M47817 Spondylosis without myelopathy or radiculopathy, lumbosacral region: Secondary | ICD-10-CM | POA: Diagnosis not present

## 2023-07-04 DIAGNOSIS — E039 Hypothyroidism, unspecified: Secondary | ICD-10-CM | POA: Diagnosis not present

## 2023-07-04 DIAGNOSIS — Z9181 History of falling: Secondary | ICD-10-CM | POA: Diagnosis not present

## 2023-07-04 DIAGNOSIS — M19172 Post-traumatic osteoarthritis, left ankle and foot: Secondary | ICD-10-CM | POA: Diagnosis not present

## 2023-07-04 DIAGNOSIS — M25572 Pain in left ankle and joints of left foot: Secondary | ICD-10-CM | POA: Diagnosis not present

## 2023-07-04 DIAGNOSIS — G4733 Obstructive sleep apnea (adult) (pediatric): Secondary | ICD-10-CM | POA: Diagnosis not present

## 2023-07-05 ENCOUNTER — Ambulatory Visit
Admission: RE | Admit: 2023-07-05 | Discharge: 2023-07-05 | Disposition: A | Discharge status: Home / Self Care | Source: Ambulatory Visit | Attending: Nurse Practitioner | Admitting: Nurse Practitioner

## 2023-07-05 DIAGNOSIS — M25561 Pain in right knee: Secondary | ICD-10-CM | POA: Diagnosis not present

## 2023-07-05 DIAGNOSIS — G8929 Other chronic pain: Secondary | ICD-10-CM | POA: Diagnosis not present

## 2023-07-07 ENCOUNTER — Other Ambulatory Visit: Payer: Self-pay | Admitting: Nurse Practitioner

## 2023-07-10 ENCOUNTER — Ambulatory Visit: Payer: Self-pay | Admitting: Nurse Practitioner

## 2023-07-10 DIAGNOSIS — G8929 Other chronic pain: Secondary | ICD-10-CM

## 2023-07-10 NOTE — Telephone Encounter (Signed)
 Requested Prescriptions  Pending Prescriptions Disp Refills   levothyroxine  (SYNTHROID ) 75 MCG tablet [Pharmacy Med Name: Levothyroxine  Sodium 75 MCG Oral Tablet] 100 tablet 2    Sig: TAKE 1 TABLET BY MOUTH DAILY     Endocrinology:  Hypothyroid Agents Passed - 07/10/2023  5:10 PM      Passed - TSH in normal range and within 360 days    TSH  Date Value Ref Range Status  04/13/2023 2.040 0.450 - 4.500 uIU/mL Final         Passed - Valid encounter within last 12 months    Recent Outpatient Visits           2 weeks ago Chronic pain of right knee   [place] [facility] Aileen Alexanders, NP   1 month ago Sleep apnea, unspecified type   [place] [facility] Aileen Alexanders, NP   2 months ago Morbid obesity [facility])   [place] [facility] Aileen Alexanders, NP

## 2023-07-11 ENCOUNTER — Other Ambulatory Visit: Payer: Self-pay | Admitting: Nurse Practitioner

## 2023-07-11 DIAGNOSIS — R928 Other abnormal and inconclusive findings on diagnostic imaging of breast: Secondary | ICD-10-CM

## 2023-07-11 DIAGNOSIS — N6489 Other specified disorders of breast: Secondary | ICD-10-CM

## 2023-07-11 NOTE — Telephone Encounter (Signed)
 Copied from CRM [id number]. Topic: Clinical - Medication Refill >> Jul 11, 2023 10:44 AM Crispin Dolphin wrote: Medication: albuterol  (VENTOLIN  HFA) 108 (90 Base) MCG/ACT inhaler - says two refills but pt mustve switched pharmacy would like [facility] to [facility]   Has the patient contacted their pharmacy? Yes (Agent: If no, request that the patient contact the pharmacy for the refill. If patient does not wish to contact the pharmacy document the reason why and proceed with request.) (Agent: If yes, when and what did the pharmacy advise?) not on her file need Rx  This is the patient's preferred pharmacy:  [facility] - [place], [place] - [address] [facility] [address] [place] [address] [place] [address] Phone: [phone] Fax: [phone]     Is this the correct pharmacy for this prescription? Yes If no, delete pharmacy and type the correct one.   Has the prescription been filled recently? No  Is the patient out of the medication? Yes  Has the patient been seen for an appointment in the last year OR does the patient have an upcoming appointment? Yes  Can we respond through MyChart? No  Agent: Please be advised that Rx refills may take up to 3 business days. We ask that you follow-up with your pharmacy.

## 2023-07-13 MED ORDER — ALBUTEROL SULFATE HFA 108 (90 BASE) MCG/ACT IN AERS: INHALATION_SPRAY | RESPIRATORY_TRACT | 2 refills | Status: DC

## 2023-07-13 NOTE — Telephone Encounter (Signed)
 Requested medication (s) are due for refill today:   Yes  Requested medication (s) are on the active medication list:   Yes  Future visit scheduled:   Yes 8/26   LOV 06/20/2023   Last ordered: 06/20/2023 18 g, 2 refills  Returned because pharmacy is requesting a 1 yr supply.      Requested Prescriptions  Pending Prescriptions Disp Refills   albuterol  (VENTOLIN  HFA) 108 (90 Base) MCG/ACT inhaler 18 g 2    Sig: USE 1 INHALATION BY MOUTH EVERY  6 HOURS AS NEEDED     Pulmonology:  Beta Agonists 2 Passed - 07/13/2023  1:16 PM      Passed - Last BP in normal range    BP Readings from Last 1 Encounters:  06/20/23 109/66         Passed - Last Heart Rate in normal range    Pulse Readings from Last 1 Encounters:  06/20/23 (!) 53         Passed - Valid encounter within last 12 months    Recent Outpatient Visits           3 weeks ago Chronic pain of right knee   [place] [facility] Aileen Alexanders, NP   1 month ago Sleep apnea, unspecified type   [place] [facility] Aileen Alexanders, NP   3 months ago Morbid obesity [facility])   [place] [facility] Aileen Alexanders, NP

## 2023-07-18 ENCOUNTER — Other Ambulatory Visit: Payer: Self-pay | Admitting: Nurse Practitioner

## 2023-07-18 DIAGNOSIS — J4541 Moderate persistent asthma with (acute) exacerbation: Secondary | ICD-10-CM

## 2023-07-20 NOTE — Telephone Encounter (Signed)
 Requested Prescriptions  Pending Prescriptions Disp Refills   budesonide -formoterol  (SYMBICORT ) 160-4.5 MCG/ACT inhaler [Pharmacy Med Name: Symbicort  160-4.5 MCG/ACT Inhalation Aerosol] 30.6 g 2    Sig: USE 2 INHALATIONS BY MOUTH TWICE DAILY     Pulmonology:  Combination Products Passed - 07/20/2023  1:42 PM      Passed - Valid encounter within last 12 months    Recent Outpatient Visits           1 month ago Chronic pain of right knee   [place] [facility] Melvin Pao, NP   2 months ago Sleep apnea, unspecified type   [place] [facility] Melvin Pao, NP   3 months ago Morbid obesity [facility])   [place] [facility] Melvin Pao, NP

## 2023-07-31 ENCOUNTER — Telehealth: Payer: Self-pay

## 2023-07-31 NOTE — Telephone Encounter (Unsigned)
 Copied from CRM [id number]. Topic: Clinical - Lab/Test Results >> Jul 31, 2023 11:04 AM Silvana PARAS wrote: Reason for CRM: [facility] [place]) is needing a copy of the test results from pt's recent bloodwork. Callback number is [phone]. Their fax is [phone].

## 2023-08-03 DIAGNOSIS — Z79891 Long term (current) use of opiate analgesic: Secondary | ICD-10-CM | POA: Diagnosis not present

## 2023-08-03 DIAGNOSIS — R5383 Other fatigue: Secondary | ICD-10-CM | POA: Diagnosis not present

## 2023-08-03 DIAGNOSIS — Z131 Encounter for screening for diabetes mellitus: Secondary | ICD-10-CM | POA: Diagnosis not present

## 2023-08-03 DIAGNOSIS — D539 Nutritional anemia, unspecified: Secondary | ICD-10-CM | POA: Diagnosis not present

## 2023-08-03 DIAGNOSIS — E7800 Pure hypercholesterolemia, unspecified: Secondary | ICD-10-CM | POA: Diagnosis not present

## 2023-08-03 DIAGNOSIS — Z1159 Encounter for screening for other viral diseases: Secondary | ICD-10-CM | POA: Diagnosis not present

## 2023-08-03 DIAGNOSIS — E559 Vitamin D deficiency, unspecified: Secondary | ICD-10-CM | POA: Diagnosis not present

## 2023-08-03 DIAGNOSIS — Z9181 History of falling: Secondary | ICD-10-CM | POA: Diagnosis not present

## 2023-08-03 DIAGNOSIS — M129 Arthropathy, unspecified: Secondary | ICD-10-CM | POA: Diagnosis not present

## 2023-08-03 DIAGNOSIS — M19172 Post-traumatic osteoarthritis, left ankle and foot: Secondary | ICD-10-CM | POA: Diagnosis not present

## 2023-08-03 NOTE — Telephone Encounter (Signed)
 Labs printed and faxed as requested

## 2023-08-08 DIAGNOSIS — G4733 Obstructive sleep apnea (adult) (pediatric): Secondary | ICD-10-CM | POA: Diagnosis not present

## 2023-08-23 ENCOUNTER — Ambulatory Visit
Admission: RE | Admit: 2023-08-23 | Discharge: 2023-08-23 | Disposition: A | Discharge status: Home / Self Care | Source: Ambulatory Visit | Attending: Nurse Practitioner | Admitting: Nurse Practitioner

## 2023-08-23 ENCOUNTER — Inpatient Hospital Stay
Admission: RE | Admit: 2023-08-23 | Discharge: 2023-08-23 | Discharge status: Home / Self Care | Source: Ambulatory Visit | Attending: Nurse Practitioner

## 2023-08-23 DIAGNOSIS — R928 Other abnormal and inconclusive findings on diagnostic imaging of breast: Secondary | ICD-10-CM

## 2023-08-23 DIAGNOSIS — N6489 Other specified disorders of breast: Secondary | ICD-10-CM | POA: Insufficient documentation

## 2023-08-23 DIAGNOSIS — R92322 Mammographic fibroglandular density, left breast: Secondary | ICD-10-CM | POA: Diagnosis not present

## 2023-09-07 DIAGNOSIS — Z79891 Long term (current) use of opiate analgesic: Secondary | ICD-10-CM | POA: Diagnosis not present

## 2023-09-07 DIAGNOSIS — Z9181 History of falling: Secondary | ICD-10-CM | POA: Diagnosis not present

## 2023-09-07 DIAGNOSIS — M25572 Pain in left ankle and joints of left foot: Secondary | ICD-10-CM | POA: Diagnosis not present

## 2023-09-07 DIAGNOSIS — M19172 Post-traumatic osteoarthritis, left ankle and foot: Secondary | ICD-10-CM | POA: Diagnosis not present

## 2023-09-07 DIAGNOSIS — E039 Hypothyroidism, unspecified: Secondary | ICD-10-CM | POA: Diagnosis not present

## 2023-09-12 ENCOUNTER — Other Ambulatory Visit: Payer: Self-pay | Admitting: Nurse Practitioner

## 2023-09-13 DIAGNOSIS — Z79899 Other long term (current) drug therapy: Secondary | ICD-10-CM | POA: Diagnosis not present

## 2023-09-13 NOTE — Telephone Encounter (Signed)
 Requested medication (s) are due for refill today: yes  Requested medication (s) are on the active medication list: yes  Last refill:  06/22/23  Future visit scheduled: yes  Notes to clinic:  med. Not assigned a protocol   Requested Prescriptions  Pending Prescriptions Disp Refills   ZEPBOUND  5 MG/0.5ML Pen [Pharmacy Med Name: ZEPBOUND  5 MG/0.5ML SUBQ SOLN ML] 2 mL 2    Sig: INJECT 5MG  SUBCUTANEOUSLY ONCE A WEEK     Off-Protocol Failed - 09/13/2023  3:36 PM      Failed - Medication not assigned to a protocol, review manually.      Passed - Valid encounter within last 12 months    Recent Outpatient Visits           2 months ago Chronic pain of right knee   [place] [facility] Melvin Pao, NP   4 months ago Sleep apnea, unspecified type   [place] [facility] Melvin Pao, NP   5 months ago Morbid obesity [facility])   [place] [facility] Melvin Pao, NP

## 2023-09-16 ENCOUNTER — Other Ambulatory Visit: Payer: Self-pay | Admitting: Nurse Practitioner

## 2023-09-18 NOTE — Telephone Encounter (Signed)
 Requested Prescriptions  Pending Prescriptions Disp Refills   albuterol  (VENTOLIN  HFA) 108 (90 Base) MCG/ACT inhaler [Pharmacy Med Name: ALBUTEROL  HFA 90MCG/ACT (V)] 18 g 2    Sig: USE 1 INHALATION BY MOUTH EVERY  6 HOURS AS NEEDED     Pulmonology:  Beta Agonists 2 Passed - 09/18/2023  4:11 PM      Passed - Last BP in normal range    BP Readings from Last 1 Encounters:  06/20/23 109/66         Passed - Last Heart Rate in normal range    Pulse Readings from Last 1 Encounters:  06/20/23 (!) 53         Passed - Valid encounter within last 12 months    Recent Outpatient Visits           3 months ago Chronic pain of right knee   [place] [facility] Melvin Pao, NP   4 months ago Sleep apnea, unspecified type   [place] [facility] Melvin Pao, NP   5 months ago Morbid obesity [facility])   [place] [facility] Melvin Pao, NP

## 2023-09-19 ENCOUNTER — Encounter: Payer: Self-pay | Admitting: Nurse Practitioner

## 2023-09-19 ENCOUNTER — Ambulatory Visit (INDEPENDENT_AMBULATORY_CARE_PROVIDER_SITE_OTHER): Admitting: Nurse Practitioner

## 2023-09-19 VITALS — BP 107/61 | HR 63 | Temp 97.8°F | Resp 15 | Ht 67.99 in | Wt 223.6 lb

## 2023-09-19 DIAGNOSIS — I10 Essential (primary) hypertension: Secondary | ICD-10-CM

## 2023-09-19 DIAGNOSIS — E785 Hyperlipidemia, unspecified: Secondary | ICD-10-CM

## 2023-09-19 DIAGNOSIS — F333 Major depressive disorder, recurrent, severe with psychotic symptoms: Secondary | ICD-10-CM | POA: Diagnosis not present

## 2023-09-19 DIAGNOSIS — G4730 Sleep apnea, unspecified: Secondary | ICD-10-CM | POA: Diagnosis not present

## 2023-09-19 DIAGNOSIS — R7301 Impaired fasting glucose: Secondary | ICD-10-CM | POA: Diagnosis not present

## 2023-09-19 DIAGNOSIS — E038 Other specified hypothyroidism: Secondary | ICD-10-CM | POA: Diagnosis not present

## 2023-09-19 DIAGNOSIS — N183 Chronic kidney disease, stage 3 unspecified: Secondary | ICD-10-CM

## 2023-09-19 MED ORDER — LEVOTHYROXINE SODIUM 175 MCG PO TABS: 175.0000 ug | ORAL_TABLET | Freq: Every day | ORAL | Status: AC

## 2023-09-19 MED ORDER — TIRZEPATIDE-WEIGHT MANAGEMENT 7.5 MG/0.5ML ~~LOC~~ SOLN
7.5000 mg | SUBCUTANEOUS | 2 refills | Status: DC
Start: 2023-09-19 — End: 2023-10-20

## 2023-09-19 NOTE — Assessment & Plan Note (Addendum)
 Chronic.  Working on weight loss with Zepbound .  Currently on 5mg . Will increase to 7.5mg  weekly to help with weight.  Continue with CPAP.

## 2023-09-19 NOTE — Progress Notes (Signed)
 BP 107/61 (BP Location: Left Arm, Patient Position: Sitting, Cuff Size: Large)   Pulse 63   Temp 97.8 F (36.6 C) (Oral)   Resp 15   Ht 5' 7.99 (1.727 m)   Wt 223 lb 9.6 oz (101.4 kg)   SpO2 96%   BMI 34.01 kg/m    Subjective:    Patient ID: Kayla Wright, female    DOB: 29-Sep-1955, 68 y.o.   MRN: [id number]  HPI: Kayla Wright is a 68 y.o. female  Chief Complaint  Patient presents with   3 month follow up   HYPERTENSION / HYPERLIPIDEMIA Satisfied with current treatment? yes Duration of hypertension: years BP monitoring frequency: not checking BP range:  BP medication side effects: no Past BP meds: clonidine  Duration of hyperlipidemia: years Cholesterol medication side effects: no Cholesterol supplements: none Past cholesterol medications: simvastatin  (zocor ) Medication compliance: excellent compliance Aspirin : no Recent stressors: no Recurrent headaches: no Visual changes: no Palpitations: no Dyspnea: no Chest pain: no Lower extremity edema: no Dizzy/lightheaded: no  HYPOTHYROIDISM Levothyroxine  75mcg daily. She feels like this is a good dose at this time.  Thyroid  control status:controlled Satisfied with current treatment? no Medication side effects: no Medication compliance: excellent compliance Etiology of hypothyroidism:  Recent dose adjustment:no Fatigue: yes Cold intolerance: no Heat intolerance: no Weight gain: no Weight loss: no Constipation: no Diarrhea/loose stools: no Palpitations: no Lower extremity edema: no Anxiety/depressed mood: no  CHRONIC KIDNEY DISEASE CKD status: controlled Medications renally dose: yes Previous renal evaluation: no Pneumovax:  Up to Date Influenza Vaccine:  Up to Date  DEPRESSION/ANXIETY She feels like her mood has been so so.  She is still taking fluoxetine  and seroquel .  Denies concerns at visit today.  Taking Fluoxetine  60mg  and Seroquel  150mg .  Denies SI.    Flowsheet Row Office Visit from 06/20/2023 in  [facility]  PHQ-9 Total Score 10      06/20/2023    3:43 PM 04/13/2023    1:08 PM 11/03/2022    3:05 PM 05/04/2022   11:01 AM  GAD 7 : Generalized Anxiety Score  Nervous, Anxious, on Edge 1 1 2 1   Control/stop worrying 1 2 1 2   Worry too much - different things 2 2 2 2   Trouble relaxing 1 1 3 3   Restless 1 1 2 2   Easily annoyed or irritable 2 2 2 2   Afraid - awful might happen 2 1 1 2   Total GAD 7 Score 10 10 13 14   Anxiety Difficulty Somewhat difficult  Somewhat difficult Somewhat difficult   SLEEP APNEA On Zepbound  for weight loss.  She is currently taking 5mg .  Okay with increase the dose.  No having any side effects from the medications.   Sleep apnea status: controlled Duration: months Satisfied with current treatment?:  yes CPAP use:  yes Sleep quality with CPAP use: excellent Treament compliance:excellent compliance Last sleep study: October 2020 Treatments attempted: CPAP Wakes feeling refreshed:  no Daytime hypersomnolence:  yes Fatigue:  yes Insomnia:  yes Good sleep hygiene:  yes Difficulty falling asleep:  yes Difficulty staying asleep:  yes Snoring bothers bed partner:  no Observed apnea by bed partner: no Obesity:  yes Hypertension: yes  Pulmonary hypertension:  no Coronary artery disease:  no     No data to display           Relevant past medical, surgical, family and social history reviewed and updated as indicated. Interim medical history since our last visit  reviewed. Allergies and medications reviewed and updated.  Review of Systems  Constitutional:  Negative for fatigue, fever and unexpected weight change.  Eyes:  Negative for visual disturbance.  Respiratory:  Negative for cough, chest tightness and shortness of breath.   Cardiovascular:  Negative for chest pain, palpitations and leg swelling.  Gastrointestinal:  Negative for constipation and diarrhea.  Endocrine: Negative for cold intolerance and heat  intolerance.  Neurological:  Negative for dizziness and headaches.  Psychiatric/Behavioral:  Negative for dysphoric mood. The patient is not nervous/anxious.     Per HPI unless specifically indicated above     Objective:    BP 107/61 (BP Location: Left Arm, Patient Position: Sitting, Cuff Size: Large)   Pulse 63   Temp 97.8 F (36.6 C) (Oral)   Resp 15   Ht 5' 7.99 (1.727 m)   Wt 223 lb 9.6 oz (101.4 kg)   SpO2 96%   BMI 34.01 kg/m   Wt Readings from Last 3 Encounters:  09/19/23 223 lb 9.6 oz (101.4 kg)  06/20/23 231 lb 3.2 oz (104.9 kg)  05/16/23 232 lb (105.2 kg)    Physical Exam Vitals and nursing note reviewed.  Constitutional:      General: She is not in acute distress.    Appearance: Normal appearance. She is obese. She is not ill-appearing, toxic-appearing or diaphoretic.  HENT:     Head: Normocephalic.     Right Ear: External ear normal.     Left Ear: External ear normal.     Nose: Nose normal.     Mouth/Throat:     Mouth: Mucous membranes are moist.     Pharynx: Oropharynx is clear.  Eyes:     General:        Right eye: No discharge.        Left eye: No discharge.     Extraocular Movements: Extraocular movements intact.     Conjunctiva/sclera: Conjunctivae normal.     Pupils: Pupils are equal, round, and reactive to light.  Cardiovascular:     Rate and Rhythm: Normal rate and regular rhythm.     Heart sounds: No murmur heard. Pulmonary:     Effort: Pulmonary effort is normal. No respiratory distress.     Breath sounds: Normal breath sounds. No wheezing or rales.  Musculoskeletal:     Cervical back: Normal range of motion and neck supple.  Skin:    General: Skin is warm and dry.     Capillary Refill: Capillary refill takes less than 2 seconds.  Neurological:     General: No focal deficit present.     Mental Status: She is alert and oriented to person, place, and time. Mental status is at baseline.  Psychiatric:        Mood and Affect: Mood normal.         Behavior: Behavior normal.        Thought Content: Thought content normal.        Judgment: Judgment normal.     Results for orders placed or performed in visit on 04/13/23  Comp Met (CMET)   Collection Time: 04/13/23  1:22 PM  Result Value Ref Range   Glucose 123 (H) 70 - 99 mg/dL   BUN 13 8 - 27 mg/dL   Creatinine, Ser 9.01 0.57 - 1.00 mg/dL   eGFR 63 >40 fO/fpw/8.26   BUN/Creatinine Ratio 13 12 - 28   Sodium 143 134 - 144 mmol/L   Potassium 4.3 3.5 - 5.2 mmol/L  Chloride 107 (H) 96 - 106 mmol/L   CO2 23 20 - 29 mmol/L   Calcium 9.0 8.7 - 10.3 mg/dL   Total Protein 6.7 6.0 - 8.5 g/dL   Albumin 4.3 3.9 - 4.9 g/dL   Globulin, Total 2.4 1.5 - 4.5 g/dL   Bilirubin Total 0.8 0.0 - 1.2 mg/dL   Alkaline Phosphatase 84 44 - 121 IU/L   AST 32 0 - 40 IU/L   ALT 32 0 - 32 IU/L  Lipid Profile   Collection Time: 04/13/23  1:22 PM  Result Value Ref Range   Cholesterol, Total 169 100 - 199 mg/dL   Triglycerides 782 (H) 0 - 149 mg/dL   HDL 40 >60 mg/dL   VLDL Cholesterol Cal 37 5 - 40 mg/dL   LDL Chol Calc (NIH) 92 0 - 99 mg/dL   Chol/HDL Ratio 4.2 0.0 - 4.4 ratio  HgB A1c   Collection Time: 04/13/23  1:22 PM  Result Value Ref Range   Hgb A1c MFr Bld 6.6 (H) 4.8 - 5.6 %   Est. average glucose Bld gHb Est-mCnc 143 mg/dL  TSH   Collection Time: 04/13/23  1:22 PM  Result Value Ref Range   TSH 2.040 0.450 - 4.500 uIU/mL  T4, free   Collection Time: 04/13/23  1:22 PM  Result Value Ref Range   Free T4 1.18 0.82 - 1.77 ng/dL      Assessment & Plan:   Problem List Items Addressed This Visit       Cardiovascular and Mediastinum   Essential hypertension   Chronic.  Controlled.  Continue with current medication regimen of Clonidine .  Recommend checking blood pressures at home and bringing log to next visit.  Refills sent today.  Labs ordered today.  Return to clinic in 6 months for reevaluation.  Call sooner if concerns arise.         Respiratory   Sleep apnea    Chronic.  Working on weight loss with Zepbound .  Currently on 5mg . Will increase to 7.5mg  weekly to help with weight.          Endocrine   Hypothyroidism   Chronic. Controlled. States her pain doctor increased her dose of Levothyroxine  to 175mcg daily.   Labs ordered during visit today.  Will adjust medications based on results.  Return to clinic in 6 months for reevaluation.  Call sooner if concerns arise.       Relevant Medications   levothyroxine  (SYNTHROID ) 175 MCG tablet   Other Relevant Orders   T4   TSH   IFG (impaired fasting glucose)   Chronic.  Controlled.  Continue with current medication regimen.  Labs ordered today.  Return to clinic in 6 months for reevaluation.  Call sooner if concerns arise.        Relevant Orders   Hemoglobin A1c     Genitourinary   CKD (chronic kidney disease) stage 3, GFR 30-59 ml/min (HCC) - Primary   Chronic.  Could benefit from SGTL2. Labs ordered at visit today.  Will make recommendations based on lab results.       Relevant Orders   Comprehensive metabolic panel with GFR     Other   Hyperlipidemia   Chronic.  Controlled.  Continue with current medication regimen of Simvastatin .  Labs ordered today.  Return to clinic in 6 months for reevaluation.  Call sooner if concerns arise.       Relevant Orders   Lipid panel   Morbid obesity (HCC)  Recommended eating smaller high protein, low fat meals more frequently and exercising 30 mins a day 5 times a week with a goal of 10-15lb weight loss in the next 3 months. Continue with Zepbound  for weight loss.       Relevant Medications   tirzepatide  7.5 MG/0.5ML injection vial   MDD (major depressive disorder), recurrent, severe, with psychosis (HCC)   Chronic.  Controlled.  Continue with current medication regimen of Prozac  and Seroquel .  Feels like she is doing well despite PHQ9 and GAD7.  Refills sent today.  Labs ordered today.  Return to clinic in 6 months for reevaluation.  Call sooner if  concerns arise.          Follow up plan: Return in about 3 months (around 12/20/2023) for Zepbound  follow up.

## 2023-09-19 NOTE — Assessment & Plan Note (Signed)
 Recommended eating smaller high protein, low fat meals more frequently and exercising 30 mins a day 5 times a week with a goal of 10-15lb weight loss in the next 3 months. Continue with Zepbound  for weight loss.

## 2023-09-19 NOTE — Assessment & Plan Note (Signed)
Chronic.  Controlled.  Continue with current medication regimen of Prozac and Seroquel.  Feels like she is doing well despite PHQ9 and GAD7.  Refills sent today.  Labs ordered today.  Return to clinic in 6 months for reevaluation.  Call sooner if concerns arise.

## 2023-09-19 NOTE — Assessment & Plan Note (Signed)
 Chronic.  Controlled.  Continue with current medication regimen of Clonidine .  Recommend checking blood pressures at home and bringing log to next visit.  Refills sent today.  Labs ordered today.  Return to clinic in 6 months for reevaluation.  Call sooner if concerns arise.

## 2023-09-19 NOTE — Assessment & Plan Note (Signed)
 Chronic. Controlled. States her pain doctor increased her dose of Levothyroxine  to 175mcg daily.   Labs ordered during visit today.  Will adjust medications based on results.  Return to clinic in 6 months for reevaluation.  Call sooner if concerns arise.

## 2023-09-19 NOTE — Assessment & Plan Note (Signed)
 Chronic.  Controlled.  Continue with current medication regimen.  Labs ordered today.  Return to clinic in 6 months for reevaluation.  Call sooner if concerns arise.  ? ?

## 2023-09-19 NOTE — Assessment & Plan Note (Signed)
Chronic.  Could benefit from SGTL2. Labs ordered at visit today.  Will make recommendations based on lab results.

## 2023-09-19 NOTE — Assessment & Plan Note (Signed)
Chronic.  Controlled.  Continue with current medication regimen of Simvastatin.  Labs ordered today.  Return to clinic in 6 months for reevaluation.  Call sooner if concerns arise.   

## 2023-09-20 ENCOUNTER — Ambulatory Visit: Payer: Self-pay | Admitting: Nurse Practitioner

## 2023-09-20 LAB — COMPREHENSIVE METABOLIC PANEL WITH GFR
ALT: 30 IU/L (ref 0–32)
AST: 26 IU/L (ref 0–40)
Albumin: 4.3 g/dL (ref 3.9–4.9)
Alkaline Phosphatase: 87 IU/L (ref 44–121)
BUN/Creatinine Ratio: 13 (ref 12–28)
BUN: 13 mg/dL (ref 8–27)
Bilirubin Total: 0.8 mg/dL (ref 0.0–1.2)
CO2: 20 mmol/L (ref 20–29)
Calcium: 9.1 mg/dL (ref 8.7–10.3)
Chloride: 104 mmol/L (ref 96–106)
Creatinine, Ser: 1.02 mg/dL — ABNORMAL HIGH (ref 0.57–1.00)
Globulin, Total: 2.4 g/dL (ref 1.5–4.5)
Glucose: 127 mg/dL — ABNORMAL HIGH (ref 70–99)
Potassium: 3.8 mmol/L (ref 3.5–5.2)
Sodium: 140 mmol/L (ref 134–144)
Total Protein: 6.7 g/dL (ref 6.0–8.5)
eGFR: 60 mL/min/1.73 (ref >59)

## 2023-09-20 LAB — LIPID PANEL
Chol/HDL Ratio: 4.3 ratio (ref 0.0–4.4)
Cholesterol, Total: 173 mg/dL (ref 100–199)
HDL: 40 mg/dL (ref >39)
LDL Chol Calc (NIH): 103 mg/dL — ABNORMAL HIGH (ref 0–99)
Triglycerides: 174 mg/dL — ABNORMAL HIGH (ref 0–149)
VLDL Cholesterol Cal: 30 mg/dL (ref 5–40)

## 2023-09-20 LAB — HEMOGLOBIN A1C
Est. average glucose Bld gHb Est-mCnc: 123 mg/dL
Hgb A1c MFr Bld: 5.9 % — ABNORMAL HIGH (ref 4.8–5.6)

## 2023-09-20 LAB — T4: T4, Total: 8.9 ug/dL (ref 4.5–12.0)

## 2023-09-20 LAB — TSH: TSH: 1.73 u[IU]/mL (ref 0.450–4.500)

## 2023-10-01 ENCOUNTER — Other Ambulatory Visit: Payer: Self-pay | Admitting: Nurse Practitioner

## 2023-10-03 NOTE — Telephone Encounter (Signed)
 Requested Prescriptions  Pending Prescriptions Disp Refills   FLUoxetine  (PROZAC ) 20 MG capsule [Pharmacy Med Name: FLUoxetine  HCl 20 MG Oral Capsule] 100 capsule 1    Sig: TAKE 1 CAPSULE BY MOUTH DAILY  WITH A 40 MG CAPSULE FOR TOTAL  60 MG DAILY     Psychiatry:  Antidepressants - SSRI Passed - 10/03/2023  8:58 AM      Passed - Completed PHQ-2 or PHQ-9 in the last 360 days      Passed - Valid encounter within last 6 months    Recent Outpatient Visits           2 weeks ago Stage 3 chronic kidney disease, unspecified whether stage 3a or 3b CKD (HCC)   [place] [facility] Melvin Pao, NP   3 months ago Chronic pain of right knee   [place] [facility] Melvin Pao, NP   4 months ago Sleep apnea, unspecified type   [place] [facility] Melvin Pao, NP   5 months ago Morbid obesity [facility])   [place] [facility] Melvin Pao, NP               FLUoxetine  (PROZAC ) 40 MG capsule [Pharmacy Med Name: FLUOXETINE   40MG   CAP] 100 capsule 1    Sig: TAKE 1 CAPSULE BY MOUTH DAILY     Psychiatry:  Antidepressants - SSRI Passed - 10/03/2023  8:58 AM      Passed - Completed PHQ-2 or PHQ-9 in the last 360 days      Passed - Valid encounter within last 6 months    Recent Outpatient Visits           2 weeks ago Stage 3 chronic kidney disease, unspecified whether stage 3a or 3b CKD (HCC)   [place] [facility] Melvin Pao, NP   3 months ago Chronic pain of right knee   [place] [facility] Melvin Pa[facility], NP   4 months ago Sleep apnea, unspecified type    [facility] Melvin Pao, NP   5 months ago Morbid obesity [facility])    [facility] Melvin Pao, NP

## 2023-10-04 ENCOUNTER — Telehealth: Payer: Self-pay

## 2023-10-04 NOTE — Telephone Encounter (Unsigned)
 Copied from CRM #[id number]. Topic: General - Other >> Oct 04, 2023  2:40 PM Jasmin G wrote: Reason for CRM: Pt requested a phone call from Ms. Nelwyn Laymon SAILOR, CMA to discuss her zepbound  medication, call her at [phone].

## 2023-10-05 NOTE — Telephone Encounter (Signed)
 Called and spoke to patient. Patient states she is unsure of what is going on with her Zepbound  prescription.   Called [facility]. Prescription is on file and can be filled on Tuesday.   Called patient back and let her know of the above information.

## 2023-10-06 DIAGNOSIS — Z79891 Long term (current) use of opiate analgesic: Secondary | ICD-10-CM | POA: Diagnosis not present

## 2023-10-06 DIAGNOSIS — M25572 Pain in left ankle and joints of left foot: Secondary | ICD-10-CM | POA: Diagnosis not present

## 2023-10-06 DIAGNOSIS — Z9181 History of falling: Secondary | ICD-10-CM | POA: Diagnosis not present

## 2023-10-06 DIAGNOSIS — M19172 Post-traumatic osteoarthritis, left ankle and foot: Secondary | ICD-10-CM | POA: Diagnosis not present

## 2023-10-06 DIAGNOSIS — E039 Hypothyroidism, unspecified: Secondary | ICD-10-CM | POA: Diagnosis not present

## 2023-10-09 ENCOUNTER — Other Ambulatory Visit: Payer: Self-pay | Admitting: Nurse Practitioner

## 2023-10-10 NOTE — Telephone Encounter (Signed)
 Requested medications are due for refill today.  yes  Requested medications are on the active medications list.  yes  Last refill. 03/29/2023 #30 0 rf  Future visit scheduled.   yes  Notes to clinic.  Refill not delegated.    Requested Prescriptions  Pending Prescriptions Disp Refills   QUEtiapine  Fumarate (SEROQUEL  XR) 150 MG 24 hr tablet [Pharmacy Med Name: QUETIAPINE   150MG   TAB  XR] 30 tablet 11    Sig: TAKE 1 TABLET BY MOUTH AT  BEDTIME     Not Delegated - Psychiatry:  Antipsychotics - Second Generation (Atypical) - quetiapine  Failed - 10/10/2023  3:52 PM      Failed - This refill cannot be delegated      Failed - Lipid Panel in normal range within the last 12 months    Cholesterol, Total  Date Value Ref Range Status  09/19/2023 173 100 - 199 mg/dL Final   LDL Chol Calc (NIH)  Date Value Ref Range Status  09/19/2023 103 (H) 0 - 99 mg/dL Final   HDL  Date Value Ref Range Status  09/19/2023 40 >39 mg/dL Final   Triglycerides  Date Value Ref Range Status  09/19/2023 174 (H) 0 - 149 mg/dL Final         Failed - CBC within normal limits and completed in the last 12 months    WBC  Date Value Ref Range Status  05/04/2022 7.0 3.4 - 10.8 x10E3/uL Final  09/18/2017 8.8 3.6 - 11.0 K/uL Final   RBC  Date Value Ref Range Status  05/04/2022 4.38 3.77 - 5.28 x10E6/uL Final  03/22/2021 4.1 3.87 - 5.11 Final   Hemoglobin  Date Value Ref Range Status  05/04/2022 13.7 11.1 - 15.9 g/dL Final   Hematocrit  Date Value Ref Range Status  05/04/2022 41.4 34.0 - 46.6 % Final   MCHC  Date Value Ref Range Status  05/04/2022 33.1 31.5 - 35.7 g/dL Final  91/73/7980 65.7 32.0 - 36.0 g/dL Final   [facility]  Date Value Ref Range Status  05/04/2022 31.3 26.6 - 33.0 pg Final  09/18/2017 31.6 26.0 - 34.0 pg Final   MCV  Date Value Ref Range Status  05/04/2022 95 79 - 97 fL Final   No results found for: PLTCOUNTKUC, LABPLAT, POCPLA RDW  Date Value Ref Range Status  05/04/2022  12.9 11.7 - 15.4 % Final         Passed - TSH in normal range and within 360 days    TSH  Date Value Ref Range Status  09/19/2023 1.730 0.450 - 4.500 uIU/mL Final         Passed - Completed PHQ-2 or PHQ-9 in the last 360 days      Passed - Last BP in normal range    BP Readings from Last 1 Encounters:  09/19/23 107/61         Passed - Last Heart Rate in normal range    Pulse Readings from Last 1 Encounters:  09/19/23 63         Passed - Valid encounter within last 6 months    Recent Outpatient Visits           3 weeks ago Stage 3 chronic kidney disease, unspecified whether stage 3a or 3b CKD (HCC)   [place] [facility] Melvin Pao, NP   3 months ago Chronic pain of right knee   [place] [facility] Melvin Pao, NP   4 months ago Sleep  apnea, unspecified type   [place] [facility] Melvin Pao, NP   6 months ago Morbid obesity [facility])   [place] [facility] Melvin Pao, NP              Passed - CMP within normal limits and completed in the last 12 months    Albumin  Date Value Ref Range Status  09/19/2023 4.3 3.9 - 4.9 g/dL Final   Alkaline Phosphatase  Date Value Ref Range Status  09/19/2023 87 44 - 121 IU/L Final   ALT  Date Value Ref Range Status  09/19/2023 30 0 - 32 IU/L Final   AST  Date Value Ref Range Status  09/19/2023 26 0 - 40 IU/L Final   BUN  Date Value Ref Range Status  09/19/2023 13 8 - 27 mg/dL Final   Calcium  Date Value Ref Range Status  09/19/2023 9.1 8.7 - 10.3 mg/dL Final   CO2  Date Value Ref Range Status  09/19/2023 20 20 - 29 mmol/L Final   Creatinine, Ser  Date Value Ref Range Status  09/19/2023 1.02 (H) 0.57 - 1.00 mg/dL Final   Glucose  Date Value Ref Range Status  09/19/2023 127 (H) 70 - 99 mg/dL Final  97/72/7976 887  Final   Glucose, Bld  Date Value Ref Range Status  09/18/2017 99 70 - 99 mg/dL Final   Potassium  Date  Value Ref Range Status  09/19/2023 3.8 3.5 - 5.2 mmol/L Final   Sodium  Date Value Ref Range Status  09/19/2023 140 134 - 144 mmol/L Final   Bilirubin Total  Date Value Ref Range Status  09/19/2023 0.8 0.0 - 1.2 mg/dL Final   Bilirubin, Direct  Date Value Ref Range Status  09/18/2017 0.1 0.0 - 0.2 mg/dL Final   Indirect Bilirubin  Date Value Ref Range Status  09/18/2017 0.7 0.3 - 0.9 mg/dL Final    Comment:    Performed at [facility], [address] [place], [place] [address]   Protein,UA  Date Value Ref Range Status  05/04/2022 Negative Negative/Trace Final   Total Protein  Date Value Ref Range Status  09/19/2023 6.7 6.0 - 8.5 g/dL Final   GFR calc Af Amer  Date Value Ref Range Status  07/15/2019 59 (L) >59 mL/min/1.73 Final    Comment:    **[facility] currently reports eGFR in compliance with the current**   recommendations of the [employer]. [facility] will   update reporting as new guidelines are published from the NKF-ASN   Task force.    eGFR  Date Value Ref Range Status  09/19/2023 60 >59 mL/min/1.73 Final   GFR calc non Af Amer  Date Value Ref Range Status  07/15/2019 51 (L) >59 mL/min/1.73 Final

## 2023-10-12 ENCOUNTER — Telehealth: Payer: Self-pay

## 2023-10-12 NOTE — Telephone Encounter (Signed)
 Copied from CRM [id number]. Topic: Clinical - Prescription Issue >> Oct 11, 2023  4:09 PM Kayla Wright wrote: Reason for CRM: The patient called regarding a discrepancy with her Zepbound  prescription. Her chart indicates tirzepatide  7.5 mg/0.5 mL injection vial, but the medication dispensed by [facility] was 5 mg. She is seeking clarification. Patient requesting a callback at [phone]

## 2023-10-19 ENCOUNTER — Other Ambulatory Visit: Payer: Self-pay | Admitting: Nurse Practitioner

## 2023-10-19 ENCOUNTER — Other Ambulatory Visit: Payer: Self-pay

## 2023-10-19 NOTE — Telephone Encounter (Signed)
 Called over to [facility] to check on prescription. They do not have the prescription on file for the 7.5 mg that was sent in last month. Tried to verbally call in prescription in the chart but pharmacist requests to have the rx sent electronically.

## 2023-10-19 NOTE — Telephone Encounter (Signed)
-----   Message from [place] H sent at 10/19/2023  2:17 PM EDT ----- FYI: E2C2 called stated patient is waiting for a return phone call in reference to discrepancy with Zepbound . Callback [phone]

## 2023-10-20 ENCOUNTER — Other Ambulatory Visit: Payer: Self-pay

## 2023-10-20 DIAGNOSIS — G4733 Obstructive sleep apnea (adult) (pediatric): Secondary | ICD-10-CM

## 2023-10-20 MED ORDER — TIRZEPATIDE-WEIGHT MANAGEMENT 7.5 MG/0.5ML ~~LOC~~ SOLN: 7.5000 mg | SUBCUTANEOUS | 2 refills | Status: DC

## 2023-10-20 MED ORDER — TIRZEPATIDE 7.5 MG/0.5ML ~~LOC~~ SOAJ: 7.5000 mg | SUBCUTANEOUS | 1 refills | Status: DC

## 2023-10-20 NOTE — Telephone Encounter (Signed)
Zepbound sent to the pharmacy.

## 2023-10-20 NOTE — Telephone Encounter (Signed)
 Requested Prescriptions  Pending Prescriptions Disp Refills   hydrOXYzine  (ATARAX ) 25 MG tablet [Pharmacy Med Name: hydrOXYzine  HCl 25 MG Oral Tablet] 90 tablet 1    Sig: TAKE 1 TABLET BY MOUTH AT  BEDTIME AS NEEDED     Ear, Nose, and Throat:  Antihistamines 2 Failed - 10/20/2023  3:38 PM      Failed - Cr in normal range and within 360 days    Creatinine, Ser  Date Value Ref Range Status  09/19/2023 1.02 (H) 0.57 - 1.00 mg/dL Final         Passed - Valid encounter within last 12 months    Recent Outpatient Visits           1 month ago Stage 3 chronic kidney disease, unspecified whether stage 3a or 3b CKD (HCC)   [place] [facility] Melvin Pao, NP   4 months ago Chronic pain of right knee   [place] [facility] Melvin Pao, NP   5 months ago Sleep apnea, unspecified type   [place] [facility] Melvin Pao, NP   6 months ago Morbid obesity [facility])   [place] [facility] Melvin Pao, NP

## 2023-10-20 NOTE — Telephone Encounter (Signed)
 Copied from CRM #[id number]. Topic: Clinical - Medication Question >> Oct 20, 2023  9:34 AM Myrick T wrote: Reason for CRM: [place] from [facility] called satted they do not have the tirzepatide  7.5 MG/0.5ML injection vial they only have the pen. Please resend script for the tirzepatide  7.5 MG/0.5ML injection pen

## 2023-10-30 DIAGNOSIS — M25572 Pain in left ankle and joints of left foot: Secondary | ICD-10-CM | POA: Diagnosis not present

## 2023-11-02 DIAGNOSIS — Z79891 Long term (current) use of opiate analgesic: Secondary | ICD-10-CM | POA: Diagnosis not present

## 2023-11-02 DIAGNOSIS — J449 Chronic obstructive pulmonary disease, unspecified: Secondary | ICD-10-CM | POA: Diagnosis not present

## 2023-11-02 DIAGNOSIS — M25572 Pain in left ankle and joints of left foot: Secondary | ICD-10-CM | POA: Diagnosis not present

## 2023-11-02 DIAGNOSIS — M19172 Post-traumatic osteoarthritis, left ankle and foot: Secondary | ICD-10-CM | POA: Diagnosis not present

## 2023-11-02 DIAGNOSIS — Z9181 History of falling: Secondary | ICD-10-CM | POA: Diagnosis not present

## 2023-11-02 DIAGNOSIS — N1831 Chronic kidney disease, stage 3a: Secondary | ICD-10-CM | POA: Diagnosis not present

## 2023-11-06 DIAGNOSIS — Z79899 Other long term (current) drug therapy: Secondary | ICD-10-CM | POA: Diagnosis not present

## 2023-11-08 ENCOUNTER — Ambulatory Visit: Payer: Self-pay | Admitting: Nurse Practitioner

## 2023-11-08 ENCOUNTER — Ambulatory Visit: Admitting: Nurse Practitioner

## 2023-11-08 ENCOUNTER — Encounter: Payer: Self-pay | Admitting: Nurse Practitioner

## 2023-11-08 VITALS — BP 105/63 | HR 62 | Temp 97.9°F | Ht 68.0 in | Wt 219.4 lb

## 2023-11-08 DIAGNOSIS — G4733 Obstructive sleep apnea (adult) (pediatric): Secondary | ICD-10-CM

## 2023-11-08 DIAGNOSIS — Z23 Encounter for immunization: Secondary | ICD-10-CM

## 2023-11-08 MED ORDER — TIRZEPATIDE-WEIGHT MANAGEMENT 7.5 MG/0.5ML ~~LOC~~ SOAJ: 7.5000 mg | SUBCUTANEOUS | 2 refills | Status: DC

## 2023-11-08 NOTE — Telephone Encounter (Signed)
 Copied from CRM [id number]. Topic: Clinical - Prescription Issue >> Nov 08, 2023  1:57 PM [name] wrote: Reason for CRM: Patient went to the pharmacy to pick up her tirzepatide  (ZEPBOUND ) 7.5 MG/0.5ML Pen. Pharmacy states in their computer system it shows 5.0 MG. Patient would like it to be resent or it someone can reach out to the pharmacy to get it updated.  Patient can be reached at [phone]

## 2023-11-08 NOTE — Assessment & Plan Note (Signed)
 Chronic.  Continue with Zepbound .  Will increase dose to 7.5mg  weekly.  Called to the pharmacy and patient will be able to pick up medication tomorrow.  Pharmacy already made patient aware.

## 2023-11-08 NOTE — Progress Notes (Signed)
 BP 105/63   Pulse 62   Temp 97.9 F (36.6 C) (Oral)   Ht 5' 8 (1.727 m)   Wt 219 lb 6.4 oz (99.5 kg)   SpO2 98%   BMI 33.36 kg/m    Subjective:    Patient ID: Kayla Wright, female    DOB: [birth year], 68 y.o.   MRN: [id number]  HPI: Kayla Wright is a 68 y.o. female  Chief Complaint  Patient presents with   Medication Problem    Patient states she keeps having 5 mg Zepbound  sent to the pharmacy but she is on the 7.5 mg. Updated med list with correct dose.    SLEEP APNEA On Zepbound  for weight loss.  She is currently taking 5mg .  Okay with increase the dose.  She has had trouble getting 7.5mg  dose.  Not having any side effects from the medications.   Sleep apnea status: controlled Duration: months Satisfied with current treatment?:  yes CPAP use:  yes Sleep quality with CPAP use: excellent Treament compliance:excellent compliance Last sleep study: October 2020 Treatments attempted: CPAP Wakes feeling refreshed:  no Daytime hypersomnolence:  yes Fatigue:  yes Insomnia:  yes Good sleep hygiene:  yes Difficulty falling asleep:  yes Difficulty staying asleep:  yes Snoring bothers bed partner:  no Observed apnea by bed partner: no Obesity:  yes Hypertension: yes  Pulmonary hypertension:  no Coronary artery disease:  no  Relevant past medical, surgical, family and social history reviewed and updated as indicated. Interim medical history since our last visit reviewed. Allergies and medications reviewed and updated.  Review of Systems  Per HPI unless specifically indicated above     Objective:    BP 105/63   Pulse 62   Temp 97.9 F (36.6 C) (Oral)   Ht 5' 8 (1.727 m)   Wt 219 lb 6.4 oz (99.5 kg)   SpO2 98%   BMI 33.36 kg/m   Wt Readings from Last 3 Encounters:  11/08/23 219 lb 6.4 oz (99.5 kg)  09/19/23 223 lb 9.6 oz (101.4 kg)  06/20/23 231 lb 3.2 oz (104.9 kg)    Physical Exam  Results for orders placed or performed in visit on 09/19/23   Comprehensive metabolic panel with GFR   Collection Time: 09/19/23 11:05 AM  Result Value Ref Range   Glucose 127 (H) 70 - 99 mg/dL   BUN 13 8 - 27 mg/dL   Creatinine, Ser 8.97 (H) 0.57 - 1.00 mg/dL   eGFR 60 >40 fO/fpw/8.26   BUN/Creatinine Ratio 13 12 - 28   Sodium 140 134 - 144 mmol/L   Potassium 3.8 3.5 - 5.2 mmol/L   Chloride 104 96 - 106 mmol/L   CO2 20 20 - 29 mmol/L   Calcium 9.1 8.7 - 10.3 mg/dL   Total Protein 6.7 6.0 - 8.5 g/dL   Albumin 4.3 3.9 - 4.9 g/dL   Globulin, Total 2.4 1.5 - 4.5 g/dL   Bilirubin Total 0.8 0.0 - 1.2 mg/dL   Alkaline Phosphatase 87 44 - 121 IU/L   AST 26 0 - 40 IU/L   ALT 30 0 - 32 IU/L  Hemoglobin A1c   Collection Time: 09/19/23 11:05 AM  Result Value Ref Range   Hgb A1c MFr Bld 5.9 (H) 4.8 - 5.6 %   Est. average glucose Bld gHb Est-mCnc 123 mg/dL  Lipid panel   Collection Time: 09/19/23 11:05 AM  Result Value Ref Range   Cholesterol, Total 173 100 - 199 mg/dL  Triglycerides 174 (H) 0 - 149 mg/dL   HDL 40 >60 mg/dL   VLDL Cholesterol Cal 30 5 - 40 mg/dL   LDL Chol Calc (NIH) 896 (H) 0 - 99 mg/dL   Chol/HDL Ratio 4.3 0.0 - 4.4 ratio  T4   Collection Time: 09/19/23 11:05 AM  Result Value Ref Range   T4, Total 8.9 4.5 - 12.0 ug/dL  TSH   Collection Time: 09/19/23 11:05 AM  Result Value Ref Range   TSH 1.730 0.450 - 4.500 uIU/mL      Assessment & Plan:   Problem List Items Addressed This Visit   None Visit Diagnoses       Need for influenza vaccination    -  Primary   Relevant Orders   Flu vaccine HIGH DOSE PF(Fluzone Trivalent) (Completed)        Follow up plan: No follow-ups on file.

## 2023-11-08 NOTE — Telephone Encounter (Signed)
 I called and spoke with patient to confirm once again the same thing that I left on her voicemail earlier today.  [facility] has confirmed that they have received the Zepbound  7.5mg  dose and it will arrive tomorrow and she can pick it up.  I confirmed this once again with [facility] over the phone.

## 2023-11-11 ENCOUNTER — Other Ambulatory Visit: Payer: Self-pay | Admitting: Nurse Practitioner

## 2023-11-12 NOTE — Progress Notes (Unsigned)
 [facility] [address] [place], [place] [address]  Pulmonary Sleep Medicine   Office Visit Note  Patient Name: Kayla Wright DOB: 07-23-1955 MRN [id number]    Chief Complaint: Obstructive Sleep Apnea visit  Brief History:  Kayla Wright is seen today for an annual follow up visit for APAP@ 5-20 cmH2O. The patient has a 5 year history of sleep apnea. Patient is using PAP nightly.  The patient feels rested after sleeping with PAP.  The patient reports benefiting from PAP use. Reported sleepiness is  improved and the Epworth Sleepiness Score is 6 out of 24. The patient will occasionally take naps. The patient complains of the following: none.  The compliance download shows 100% compliance with an average use time of 8 hours 4 minutes. The AHI is 1.3.  The patient does not complain of limb movements disrupting sleep. The patient continues to require PAP therapy in order to eliminate sleep apnea.   ROS  General: (-) fever, (-) chills, (-) night sweat Nose and Sinuses: (-) nasal stuffiness or itchiness, (-) postnasal drip, (-) nosebleeds, (-) sinus trouble. Mouth and Throat: (-) sore throat, (-) hoarseness. Neck: (-) swollen glands, (-) enlarged thyroid , (-) neck pain. Respiratory: - cough, - shortness of breath, - wheezing. Neurologic: - numbness, - tingling. Psychiatric: + anxiety, + depression   Current Medication: Outpatient Encounter Medications as of 11/13/2023  Medication Sig Note   albuterol  (VENTOLIN  HFA) 108 (90 Base) MCG/ACT inhaler USE 1 INHALATION BY MOUTH EVERY  6 HOURS AS NEEDED    Ascorbic Acid (VITAMIN C ) 1000 MG tablet Take 1 tablet (1,000 mg total) by mouth daily.    budesonide -formoterol  (SYMBICORT ) 160-4.5 MCG/ACT inhaler USE 2 INHALATIONS BY MOUTH TWICE DAILY    Cholecalciferol (VITAMIN D3) 125 MCG (5000 UT) CAPS Take 1 capsule (5,000 Units total) by mouth daily.    cloNIDine  (CATAPRES ) 0.2 MG tablet TAKE 1 TABLET BY MOUTH 3 TIMES  DAILY    Cyanocobalamin   (VITAMIN B-12 PO) Take 1 tablet by mouth daily.    FLUoxetine  (PROZAC ) 20 MG capsule TAKE 1 CAPSULE BY MOUTH DAILY  WITH A 40 MG CAPSULE FOR TOTAL  60 MG DAILY    FLUoxetine  (PROZAC ) 40 MG capsule TAKE 1 CAPSULE BY MOUTH DAILY    hydrOXYzine  (ATARAX ) 25 MG tablet TAKE 1 TABLET BY MOUTH AT  BEDTIME AS NEEDED    levothyroxine  (SYNTHROID ) 175 MCG tablet Take 1 tablet (175 mcg total) by mouth daily.    methocarbamol  (ROBAXIN ) 500 MG tablet Take 500 mg by mouth 3 (three) times daily. 03/23/2023: prn   naloxone (NARCAN) nasal spray 4 mg/0.1 mL Place 1 spray into the nose once.    nystatin  cream (MYCOSTATIN ) Apply 1 Application topically 2 (two) times daily. 03/23/2023: prn   oxyCODONE  (OXY IR/ROXICODONE ) 5 MG immediate release tablet Take 5 mg by mouth 4 (four) times daily as needed.    QUEtiapine  Fumarate (SEROQUEL  XR) 150 MG 24 hr tablet TAKE 1 TABLET BY MOUTH AT  BEDTIME    simvastatin  (ZOCOR ) 40 MG tablet TAKE 1 TABLET BY MOUTH DAILY    tirzepatide  (ZEPBOUND ) 7.5 MG/0.5ML Pen Inject 7.5 mg into the skin once a week.    No facility-administered encounter medications on file as of 11/13/2023.    Surgical History: Past Surgical History:  Procedure Laterality Date   ANKLE FRACTURE SURGERY  06/18/15   COLONOSCOPY WITH PROPOFOL  N/A 08/31/2017   Procedure: COLONOSCOPY WITH PROPOFOL ;  Surgeon: Unk Corinn Skiff, MD;  Location: [facility] ENDOSCOPY;  Service:  Gastroenterology;  Laterality: N/A;   TUBAL LIGATION      Medical History: Past Medical History:  Diagnosis Date   Allergy    Common migraine    Depression    GERD (gastroesophageal reflux disease)    Hyperlipidemia    Hypertension    Insomnia    Menopausal symptom    Osteopenia    Thyroid  disease     Family History: Non contributory to the present illness  Social History: Social History   Socioeconomic History   Marital status: Widowed    Spouse name: Not on file   Number of children: 1   Years of education: Not on file   Highest  education level: Not on file  Occupational History   Occupation: retired  Tobacco Use   Smoking status: Never   Smokeless tobacco: Never  Vaping Use   Vaping status: Never Used  Substance and Sexual Activity   Alcohol use: No    Alcohol/week: 0.0 standard drinks of alcohol   Drug use: Yes    Types: Oxycodone    Sexual activity: Not Currently    Birth control/protection: Surgical  Other Topics Concern   Not on file  Social History Narrative   Not on file   Social Drivers of Health   Financial Resource Strain: Low Risk  (03/23/2023)   Overall Financial Resource Strain (CARDIA)    Difficulty of Paying Living Expenses: Not hard at all  Food Insecurity: No Food Insecurity (03/23/2023)   Hunger Vital Sign    Worried About Running Out of Food in the Last Year: Never true    Ran Out of Food in the Last Year: Never true  Transportation Needs: No Transportation Needs (03/23/2023)   PRAPARE - Administrator, Civil Service (Medical): No    Lack of Transportation (Non-Medical): No  Physical Activity: Insufficiently Active (03/23/2023)   Exercise Vital Sign    Days of Exercise per Week: 3 days    Minutes of Exercise per Session: 20 min  Stress: No Stress Concern Present (03/23/2023)   [employer] of Occupational Health - Occupational Stress Questionnaire    Feeling of Stress : Not at all  Social Connections: Moderately Isolated (03/23/2023)   Social Connection and Isolation Panel    Frequency of Communication with Friends and Family: More than three times a week    Frequency of Social Gatherings with Friends and Family: Three times a week    Attends Religious Services: More than 4 times per year    Active Member of Clubs or Organizations: No    Attends Banker Meetings: Never    Marital Status: Widowed  Intimate Partner Violence: Not At Risk (03/23/2023)   Humiliation, Afraid, Rape, and Kick questionnaire    Fear of Current or Ex-Partner: No    Emotionally  Abused: No    Physically Abused: No    Sexually Abused: No    Vital Signs: Blood pressure 112/61, pulse 67, resp. rate 16, height 5' 8 (1.727 m), weight 220 lb (99.8 kg), SpO2 98%. Body mass index is 33.45 kg/m.    Examination: General Appearance: The patient is well-developed, well-nourished, and in no distress. Neck Circumference: 48 cm Skin: Gross inspection of skin unremarkable. Head: normocephalic, no gross deformities. Eyes: no gross deformities noted. ENT: ears appear grossly normal Neurologic: Alert and oriented. No involuntary movements.  STOP BANG RISK ASSESSMENT S (snore) Have you been told that you snore?     NO   T (tired) Are you  often tired, fatigued, or sleepy during the day?   NO  O (obstruction) Do you stop breathing, choke, or gasp during sleep? NO   P (pressure) Do you have or are you being treated for high blood pressure? YES   B (BMI) Is your body index greater than 35 kg/m? YES   A (age) Are you 75 years old or older? YES   N (neck) Do you have a neck circumference greater than 16 inches?   YES   G (gender) Are you a female? NO   TOTAL STOP/BANG "YES" ANSWERS 4       A STOP-Bang score of 2 or less is considered low risk, and a score of 5 or more is high risk for having either moderate or severe OSA. For people who score 3 or 4, doctors may need to perform further assessment to determine how likely they are to have OSA.         EPWORTH SLEEPINESS SCALE:  Scale:  (0)= no chance of dozing; (1)= slight chance of dozing; (2)= moderate chance of dozing; (3)= high chance of dozing  Chance  Situtation    Sitting and reading:2    Watching TV: 2    Sitting Inactive in public: 0    As a passenger in car: 0      Lying down to rest: 1    Sitting and talking: 0    Sitting quielty after lunch: 1    In a car, stopped in traffic: 0   TOTAL SCORE:   6 out of 24    SLEEP STUDIES:  PSG (04/2018) AHI 44.2/hr, REM AHI 63.9/hr, min SpO2  79%   CPAP COMPLIANCE DATA:  Date Range: 11/10/2022-11/09/2023  Average Daily Use: 8 hours 4 minutes  Median Use: 8 hours 6 minutes  Compliance for > 4 Hours: 100%  AHI: 1.3 respiratory events per hour  Days Used: 365/365 days  Mask Leak: 23.5  95th Percentile Pressure: 13.9         LABS: Recent Results (from the past 2160 hours)  Comprehensive metabolic panel with GFR     Status: Abnormal   Collection Time: 09/19/23 11:05 AM  Result Value Ref Range   Glucose 127 (H) 70 - 99 mg/dL   BUN 13 8 - 27 mg/dL   Creatinine, Ser 8.97 (H) 0.57 - 1.00 mg/dL   eGFR 60 >40 fO/fpw/8.26   BUN/Creatinine Ratio 13 12 - 28   Sodium 140 134 - 144 mmol/L   Potassium 3.8 3.5 - 5.2 mmol/L   Chloride 104 96 - 106 mmol/L   CO2 20 20 - 29 mmol/L   Calcium 9.1 8.7 - 10.3 mg/dL   Total Protein 6.7 6.0 - 8.5 g/dL   Albumin 4.3 3.9 - 4.9 g/dL   Globulin, Total 2.4 1.5 - 4.5 g/dL   Bilirubin Total 0.8 0.0 - 1.2 mg/dL   Alkaline Phosphatase 87 44 - 121 IU/L   AST 26 0 - 40 IU/L   ALT 30 0 - 32 IU/L  Hemoglobin A1c     Status: Abnormal   Collection Time: 09/19/23 11:05 AM  Result Value Ref Range   Hgb A1c MFr Bld 5.9 (H) 4.8 - 5.6 %    Comment:          Prediabetes: 5.7 - 6.4          Diabetes: >6.4          Glycemic control for adults with diabetes: <7.0    Est. average  glucose Bld gHb Est-mCnc 123 mg/dL  Lipid panel     Status: Abnormal   Collection Time: 09/19/23 11:05 AM  Result Value Ref Range   Cholesterol, Total 173 100 - 199 mg/dL   Triglycerides 825 (H) 0 - 149 mg/dL   HDL 40 >60 mg/dL   VLDL Cholesterol Cal 30 5 - 40 mg/dL   LDL Chol Calc (NIH) 896 (H) 0 - 99 mg/dL   Chol/HDL Ratio 4.3 0.0 - 4.4 ratio    Comment:                                   T. Chol/HDL Ratio                                             Men  Women                               1/2 Avg.Risk  3.4    3.3                                   Avg.Risk  5.0    4.4                                2X  Avg.Risk  9.6    7.1                                3X Avg.Risk 23.4   11.0   T4     Status: None   Collection Time: 09/19/23 11:05 AM  Result Value Ref Range   T4, Total 8.9 4.5 - 12.0 ug/dL  TSH     Status: None   Collection Time: 09/19/23 11:05 AM  Result Value Ref Range   TSH 1.730 0.450 - 4.500 uIU/mL    Radiology: MM 3D DIAGNOSTIC MAMMOGRAM UNILATERAL LEFT BREAST Result Date: 08/23/2023 CLINICAL DATA:  BI-RADS 3 follow-up of a LEFT breast asymmetry. This was initiated in January 2025. EXAM: DIGITAL DIAGNOSTIC UNILATERAL LEFT MAMMOGRAM WITH TOMOSYNTHESIS AND CAD TECHNIQUE: Left digital diagnostic mammography and breast tomosynthesis was performed. The images were evaluated with computer-aided detection. COMPARISON:  Previous exam(s). ACR Breast Density Category b: There are scattered areas of fibroglandular density. FINDINGS: Diagnostic images of the LEFT breast demonstrate mammographic stability of a LEFT breast asymmetry in the outer breast at middle depth. This is only seen on CC view. No new suspicious findings are noted. IMPRESSION: Stable probably benign LEFT breast asymmetry. Recommend follow-up diagnostic mammogram in 6 months. This will establish 1 year of definitive stability. RECOMMENDATION: Recommend bilateral diagnostic mammogram (with RIGHT and LEFT breast ultrasound if deemed necessary) in 6 months. Patient is due for contralateral screening at this point in time. I have discussed the findings and recommendations with the patient. If applicable, a reminder letter will be sent to the patient regarding the next appointment. BI-RADS CATEGORY  3: Probably benign. Electronically Signed   By: Corean Salter M.D.   On: 08/23/2023 14:38    No results found.  No results found.  Assessment and Plan: Patient Active Problem List   Diagnosis Date Noted   MDD (major depressive disorder), recurrent, severe, with psychosis (HCC) 05/04/2022   Disturbance of sleep 11/26/2021    Skin yeast infection 11/26/2021   OSA on CPAP 11/09/2020   CPAP use counseling 11/09/2020   CKD (chronic kidney disease) stage 3, GFR 30-59 ml/min (HCC) 01/10/2020   IFG (impaired fasting glucose) 07/17/2019   Essential hypertension 09/27/2017   History of cocaine use 12/06/2016   Pain medication agreement broken 12/06/2016   Neurogenic pain 11/09/2016   Chronic bilateral low back pain with left-sided sciatica 11/09/2016   DDD (degenerative disc disease), cervical (C5-6 and C6-7) 10/11/2016   Chronic Cervical foraminal stenosis (C3-4) (Right) 10/11/2016   Osteopenia of lumbar spine 10/11/2016   DDD (degenerative disc disease), lumbar (L1-2 and L5-S1) 10/11/2016   Chronic lower extremity pain (Left) 08/29/2016   Chronic pain syndrome 08/29/2016   Long term prescription opiate use 08/29/2016   Morbid obesity (HCC) 08/09/2016   Asthma 08/09/2016   Allergic rhinitis 06/17/2016   Insomnia 11/02/2015   Murmur 07/10/2015   Hyperlipidemia 10/21/2014   Hypothyroidism 10/21/2014   Anxiety disorder 10/21/2014   Sleep apnea 07/02/2014   1. OSA on CPAP (Primary) The patient does tolerate PAP and reports  benefit from PAP use. She is also using and benefitting from Zepbound .  The patient was reminded how to clean equipment and advised to replace supplies routinely. The patient was also counselled on weight loss. The compliance is excellent. The AHI is 1.3.   OSA on cpap- controlled. Continue with excellent compliance with pap. CPAP continues to be medically necessary to treat this patient's OSA. F/u one year.    2. CPAP use counseling CPAP Counseling: had a lengthy discussion with the patient regarding the importance of PAP therapy in management of the sleep apnea. Patient appears to understand the risk factor reduction and also understands the risks associated with untreated sleep apnea. Patient will try to make a good faith effort to remain compliant with therapy. Also instructed the patient on  proper cleaning of the device including the water must be changed daily if possible and use of distilled water is preferred. Patient understands that the machine should be regularly cleaned with appropriate recommended cleaning solutions that do not damage the PAP machine for example given white vinegar and water rinses. Other methods such as ozone treatment may not be as good as these simple methods to achieve cleaning.   3. Essential hypertension Controlled. Continue to monitor.       General Counseling: I have discussed the findings of the evaluation and examination with Levorn.  I have also discussed any further diagnostic evaluation thatmay be needed or ordered today. Caree verbalizes understanding of the findings of todays visit. We also reviewed her medications today and discussed drug interactions and side effects including but not limited excessive drowsiness and altered mental states. We also discussed that there is always a risk not just to her but also people around her. she has been encouraged to call the office with any questions or concerns that should arise related to todays visit.  No orders of the defined types were placed in this encounter.       I have personally obtained a history, examined the patient, evaluated laboratory and imaging results, formulated the assessment and plan and placed orders. This patient was seen today by Lauraine Lay, PA-C in collaboration with Dr. Elfreda Bathe.   Elfreda DELENA Bathe, MD [facility] Diplomate  [facility] Pulmonary Critical Care Medicine and Sleep Medicine

## 2023-11-13 ENCOUNTER — Ambulatory Visit (INDEPENDENT_AMBULATORY_CARE_PROVIDER_SITE_OTHER): Admitting: Internal Medicine

## 2023-11-13 VITALS — BP 112/61 | HR 67 | Resp 16 | Ht 68.0 in | Wt 220.0 lb

## 2023-11-13 DIAGNOSIS — G4733 Obstructive sleep apnea (adult) (pediatric): Secondary | ICD-10-CM | POA: Diagnosis not present

## 2023-11-13 DIAGNOSIS — Z7189 Other specified counseling: Secondary | ICD-10-CM | POA: Diagnosis not present

## 2023-11-13 DIAGNOSIS — I10 Essential (primary) hypertension: Secondary | ICD-10-CM | POA: Diagnosis not present

## 2023-11-13 NOTE — Patient Instructions (Signed)

## 2023-11-14 NOTE — Telephone Encounter (Signed)
 Requested Prescriptions  Refused Prescriptions Disp Refills   simvastatin  (ZOCOR ) 40 MG tablet [Pharmacy Med Name: Simvastatin  40 MG Oral Tablet] 100 tablet 2    Sig: TAKE 1 TABLET BY MOUTH DAILY     Cardiovascular:  Antilipid - Statins Failed - 11/14/2023 11:29 AM      Failed - Lipid Panel in normal range within the last 12 months    Cholesterol, Total  Date Value Ref Range Status  09/19/2023 173 100 - 199 mg/dL Final   LDL Chol Calc (NIH)  Date Value Ref Range Status  09/19/2023 103 (H) 0 - 99 mg/dL Final   HDL  Date Value Ref Range Status  09/19/2023 40 >39 mg/dL Final   Triglycerides  Date Value Ref Range Status  09/19/2023 174 (H) 0 - 149 mg/dL Final         Passed - Patient is not pregnant      Passed - Valid encounter within last 12 months    Recent Outpatient Visits           6 days ago Need for influenza vaccination   [facility] Melvin Pao, NP   1 month ago Stage 3 chronic kidney disease, unspecified whether stage 3a or 3b CKD (HCC)   [place] [facility] Melvin Pao, NP   4 months ago Chronic pain of right knee   [facility] Melvin Pao, NP   6 months ago Sleep apnea, unspecified type   [place] [facility] Melvin Pao, NP   7 months ago Morbid obesity [facility])    [facility] Melvin Pao, NP

## 2023-11-17 ENCOUNTER — Telehealth: Payer: Self-pay

## 2023-11-17 NOTE — Telephone Encounter (Signed)
 Called and LVM asking for patient to please return my call. RX was denied because it was sent in May for a 9 day supply with 2 additional refils. Patient should not be out if of medication.

## 2023-11-17 NOTE — Telephone Encounter (Signed)
 Copied from CRM #[id number]. Topic: Clinical - Prescription Issue >> Nov 16, 2023  3:12 PM Kayla Wright wrote: Pt would like to know why her refill for simvastatin  (ZOCOR ) 40 MG tablet [[id number]] was denied

## 2023-11-21 DIAGNOSIS — M19172 Post-traumatic osteoarthritis, left ankle and foot: Secondary | ICD-10-CM | POA: Diagnosis not present

## 2023-11-21 DIAGNOSIS — E039 Hypothyroidism, unspecified: Secondary | ICD-10-CM | POA: Diagnosis not present

## 2023-11-21 DIAGNOSIS — Z79891 Long term (current) use of opiate analgesic: Secondary | ICD-10-CM | POA: Diagnosis not present

## 2023-11-21 DIAGNOSIS — E7800 Pure hypercholesterolemia, unspecified: Secondary | ICD-10-CM | POA: Diagnosis not present

## 2023-11-21 DIAGNOSIS — M129 Arthropathy, unspecified: Secondary | ICD-10-CM | POA: Diagnosis not present

## 2023-11-21 DIAGNOSIS — R5383 Other fatigue: Secondary | ICD-10-CM | POA: Diagnosis not present

## 2023-11-21 DIAGNOSIS — M109 Gout, unspecified: Secondary | ICD-10-CM | POA: Diagnosis not present

## 2023-11-21 DIAGNOSIS — Z9181 History of falling: Secondary | ICD-10-CM | POA: Diagnosis not present

## 2023-11-21 DIAGNOSIS — M25572 Pain in left ankle and joints of left foot: Secondary | ICD-10-CM | POA: Diagnosis not present

## 2023-11-23 ENCOUNTER — Telehealth: Payer: Self-pay

## 2023-11-23 NOTE — Telephone Encounter (Signed)
 Copied from CRM #[id number]. Topic: Clinical - Prescription Issue >> Nov 16, 2023  3:12 PM Winona R wrote: Pt would like to know why her refill for simvastatin  (ZOCOR ) 40 MG tablet [[id number]] was denied >> Nov 23, 2023 11:35 AM Amy B wrote: Patient is still awaiting call back.  She needs a refill of her  simvastatin  (ZOCOR ) 40 MG tablet which has been denied.  She would like an explanation.  >> Nov 17, 2023  2:51 PM Winona R wrote: Pt returning Katricia Prehn's call

## 2023-11-23 NOTE — Telephone Encounter (Signed)
 Called [facility] because according to chart, medication is not due to be refilled. [facility] states that the patient still has a partial refill of 70 tablets left on file to fill for the patient. They have been dispensing 100 tablets at a time to the patient. Pharmacy is sending out the 70 tablets and should arrive to patient in 3 to 5 business days.   Called and spoke with patient. I advised her of the above. Asked patient if she was out of her medication and patient states that she is not out yet.

## 2023-12-11 ENCOUNTER — Telehealth: Payer: Self-pay | Admitting: Nurse Practitioner

## 2023-12-11 NOTE — Telephone Encounter (Signed)
 Copied from CRM #[id number]. Topic: Referral - Request for Referral >> Dec 11, 2023  1:26 PM Everette C wrote: Did the patient discuss referral with their provider in the last year? No (If No - schedule appointment) (If Yes - send message)  Appointment offered? No  Type of order/referral and detailed reason for visit: Nephrology, the patient has been told by their pulmonologist that they need a referral   Preference of office, provider, location: Patient has no preference    If referral order, have you been seen by this specialty before? No (If Yes, this issue or another issue? When? Where?  Can we respond through MyChart? No

## 2023-12-11 NOTE — Telephone Encounter (Signed)
 Please reach out and schedule appointment based on what she is looking for a referral on. Thank you!

## 2023-12-12 ENCOUNTER — Other Ambulatory Visit: Payer: Self-pay | Admitting: Nurse Practitioner

## 2023-12-12 MED ORDER — CLONIDINE HCL 0.2 MG PO TABS: 0.2000 mg | ORAL_TABLET | Freq: Three times a day (TID) | ORAL | 1 refills | Status: AC

## 2023-12-12 NOTE — Telephone Encounter (Signed)
 Called patient to schedule referral appt, stated she will discuss referral for Nephrology at her 12-26-23 appt with provider.

## 2023-12-12 NOTE — Telephone Encounter (Signed)
 Prescription Request  12/12/2023  LOV: 11/08/2023  What is the name of the medication or equipment? cloNIDine  (CATAPRES ) 0.2 MG tablet   Have you contacted your pharmacy to request a refill? No   Which pharmacy would you like this sent to?   [facility] - [place], [place] - [address] [place] [address] Phone: [phone] Fax: [phone]    Patient notified that their request is being sent to the clinical staff for review and that they should receive a response within 2 business days.   Please advise at Mobile [phone] (mobile)

## 2023-12-23 ENCOUNTER — Other Ambulatory Visit: Payer: Self-pay | Admitting: Nurse Practitioner

## 2023-12-26 ENCOUNTER — Encounter: Payer: Self-pay | Admitting: Nurse Practitioner

## 2023-12-26 ENCOUNTER — Ambulatory Visit: Admitting: Nurse Practitioner

## 2023-12-26 VITALS — BP 98/61 | HR 66 | Temp 97.8°F | Ht 67.99 in | Wt 214.0 lb

## 2023-12-26 DIAGNOSIS — I10 Essential (primary) hypertension: Secondary | ICD-10-CM

## 2023-12-26 DIAGNOSIS — Z23 Encounter for immunization: Secondary | ICD-10-CM | POA: Diagnosis not present

## 2023-12-26 NOTE — Assessment & Plan Note (Signed)
 Chronic.  Has lost 6lbs since last visit on Zepbound  7.5mg .  She has only been on this dose for 1 month.  Will continue for the next 3 months.  Can increase to Zepbound  10mg  at next visit if weight loss has Plateaued.  Follow up in 3 months.  Call sooner if concerns arise.  Pain management was concerned about patients renal function. Will double check with CMP at visit today.  Will make recommendations based on results.

## 2023-12-26 NOTE — Progress Notes (Signed)
 BP 98/61 (BP Location: Right Arm, Cuff Size: Large)   Pulse 66   Temp 97.8 F (36.6 C) (Oral)   Ht 5' 7.99 (1.727 m)   Wt 214 lb (97.1 kg)   SpO2 95%   BMI 32.55 kg/m    Subjective:    Patient ID: Kayla Wright, female    DOB: 08/02/1955, 68 y.o.   MRN: [id number]  HPI: Kayla Wright is a 68 y.o. female  Chief Complaint  Patient presents with   zepbound     3 month F/u.   SLEEP APNEA On Zepbound  for weight loss and OSA.  She is down 6lbs since last visit.  She is currently taking 7.5mg .    Not having any side effects from the medications.  Denies nausea or constipation. Sleep apnea status: controlled Duration: months Satisfied with current treatment?:  yes CPAP use:  yes Sleep quality with CPAP use: excellent Treament compliance:excellent compliance Last sleep study: October 2020 Treatments attempted: CPAP Wakes feeling refreshed:  no Daytime hypersomnolence:  yes Fatigue:  yes Insomnia:  yes Good sleep hygiene:  yes Difficulty falling asleep:  yes Difficulty staying asleep:  yes Snoring bothers bed partner:  no Observed apnea by bed partner: no Obesity:  yes Hypertension: yes  Pulmonary hypertension:  no Coronary artery disease:  no  Relevant past medical, surgical, family and social history reviewed and updated as indicated. Interim medical history since our last visit reviewed.  Allergies and medications reviewed and updated.  Review of Systems  Constitutional:  Negative for unexpected weight change.  Gastrointestinal:  Negative for constipation and nausea.    Per HPI unless specifically indicated above     Objective:    BP 98/61 (BP Location: Right Arm, Cuff Size: Large)   Pulse 66   Temp 97.8 F (36.6 C) (Oral)   Ht 5' 7.99 (1.727 m)   Wt 214 lb (97.1 kg)   SpO2 95%   BMI 32.55 kg/m   Wt Readings from Last 3 Encounters:  12/26/23 214 lb (97.1 kg)  11/13/23 220 lb (99.8 kg)  11/08/23 219 lb 6.4 oz (99.5 kg)    Physical Exam Vitals and  nursing note reviewed.  Constitutional:      General: She is not in acute distress.    Appearance: Normal appearance. She is obese. She is not ill-appearing, toxic-appearing or diaphoretic.  HENT:     Head: Normocephalic.     Right Ear: External ear normal.     Left Ear: External ear normal.     Nose: Nose normal.     Mouth/Throat:     Mouth: Mucous membranes are moist.     Pharynx: Oropharynx is clear.  Eyes:     General:        Right eye: No discharge.        Left eye: No discharge.     Extraocular Movements: Extraocular movements intact.     Conjunctiva/sclera: Conjunctivae normal.     Pupils: Pupils are equal, round, and reactive to light.  Cardiovascular:     Rate and Rhythm: Normal rate and regular rhythm.     Heart sounds: No murmur heard. Pulmonary:     Effort: Pulmonary effort is normal. No respiratory distress.     Breath sounds: Normal breath sounds. No wheezing or rales.  Musculoskeletal:     Cervical back: Normal range of motion and neck supple.  Skin:    General: Skin is warm and dry.     Capillary Refill: Capillary  refill takes less than 2 seconds.  Neurological:     General: No focal deficit present.     Mental Status: She is alert and oriented to person, place, and time. Mental status is at baseline.  Psychiatric:        Mood and Affect: Mood normal.        Behavior: Behavior normal.        Thought Content: Thought content normal.        Judgment: Judgment normal.     Results for orders placed or performed in visit on 09/19/23  Comprehensive metabolic panel with GFR   Collection Time: 09/19/23 11:05 AM  Result Value Ref Range   Glucose 127 (H) 70 - 99 mg/dL   BUN 13 8 - 27 mg/dL   Creatinine, Ser 8.97 (H) 0.57 - 1.00 mg/dL   eGFR 60 >40 fO/fpw/8.26   BUN/Creatinine Ratio 13 12 - 28   Sodium 140 134 - 144 mmol/L   Potassium 3.8 3.5 - 5.2 mmol/L   Chloride 104 96 - 106 mmol/L   CO2 20 20 - 29 mmol/L   Calcium 9.1 8.7 - 10.3 mg/dL   Total Protein  6.7 6.0 - 8.5 g/dL   Albumin 4.3 3.9 - 4.9 g/dL   Globulin, Total 2.4 1.5 - 4.5 g/dL   Bilirubin Total 0.8 0.0 - 1.2 mg/dL   Alkaline Phosphatase 87 44 - 121 IU/L   AST 26 0 - 40 IU/L   ALT 30 0 - 32 IU/L  Hemoglobin A1c   Collection Time: 09/19/23 11:05 AM  Result Value Ref Range   Hgb A1c MFr Bld 5.9 (H) 4.8 - 5.6 %   Est. average glucose Bld gHb Est-mCnc 123 mg/dL  Lipid panel   Collection Time: 09/19/23 11:05 AM  Result Value Ref Range   Cholesterol, Total 173 100 - 199 mg/dL   Triglycerides 825 (H) 0 - 149 mg/dL   HDL 40 >60 mg/dL   VLDL Cholesterol Cal 30 5 - 40 mg/dL   LDL Chol Calc (NIH) 896 (H) 0 - 99 mg/dL   Chol/HDL Ratio 4.3 0.0 - 4.4 ratio  T4   Collection Time: 09/19/23 11:05 AM  Result Value Ref Range   T4, Total 8.9 4.5 - 12.0 ug/dL  TSH   Collection Time: 09/19/23 11:05 AM  Result Value Ref Range   TSH 1.730 0.450 - 4.500 uIU/mL      Assessment & Plan:   Problem List Items Addressed This Visit       Cardiovascular and Mediastinum   Essential hypertension   Relevant Orders   Comp Met (CMET)     Other   Morbid obesity (HCC)   Chronic.  Has lost 6lbs since last visit on Zepbound  7.5mg .  She has only been on this dose for 1 month.  Will continue for the next 3 months.  Can increase to Zepbound  10mg  at next visit if weight loss has Plateaued.  Follow up in 3 months.  Call sooner if concerns arise.  Pain management was concerned about patients renal function. Will double check with CMP at visit today.  Will make recommendations based on results.      Other Visit Diagnoses       Need for COVID-19 vaccine    -  Primary   Relevant Orders   Pfizer Comirnaty Covid-19 Vaccine 70yrs & older (Completed)         Follow up plan: Return in about 3 months (around 03/25/2024) for HTN, HLD, DM2  FU.

## 2023-12-27 ENCOUNTER — Ambulatory Visit: Payer: Self-pay | Admitting: Nurse Practitioner

## 2023-12-27 LAB — COMPREHENSIVE METABOLIC PANEL WITH GFR
ALT: 26 IU/L (ref 0–32)
AST: 24 IU/L (ref 0–40)
Albumin: 4 g/dL (ref 3.9–4.9)
Alkaline Phosphatase: 76 IU/L (ref 49–135)
BUN/Creatinine Ratio: 14 (ref 12–28)
BUN: 13 mg/dL (ref 8–27)
Bilirubin Total: 0.7 mg/dL (ref 0.0–1.2)
CO2: 21 mmol/L (ref 20–29)
Calcium: 9.1 mg/dL (ref 8.7–10.3)
Chloride: 105 mmol/L (ref 96–106)
Creatinine, Ser: 0.92 mg/dL (ref 0.57–1.00)
Globulin, Total: 2.3 g/dL (ref 1.5–4.5)
Glucose: 124 mg/dL — ABNORMAL HIGH (ref 70–99)
Potassium: 4.3 mmol/L (ref 3.5–5.2)
Sodium: 141 mmol/L (ref 134–144)
Total Protein: 6.3 g/dL (ref 6.0–8.5)
eGFR: 68 mL/min/1.73 (ref >59)

## 2023-12-27 NOTE — Telephone Encounter (Signed)
 Requested Prescriptions  Pending Prescriptions Disp Refills   albuterol  (VENTOLIN  HFA) 108 (90 Base) MCG/ACT inhaler [Pharmacy Med Name: ALBUTEROL  HFA 90MCG/ACT (V)] 36 g 2    Sig: USE 1 INHALATION BY MOUTH EVERY  6 HOURS AS NEEDED     Pulmonology:  Beta Agonists 2 Passed - 12/27/2023  1:41 PM      Passed - Last BP in normal range    BP Readings from Last 1 Encounters:  12/26/23 98/61         Passed - Last Heart Rate in normal range    Pulse Readings from Last 1 Encounters:  12/26/23 66         Passed - Valid encounter within last 12 months    Recent Outpatient Visits           Yesterday Need for COVID-19 vaccine   [place] [facility] Melvin Pao, NP   1 month ago Need for influenza vaccination   [place] [facility] Melvin Pao, NP   3 months ago Stage 3 chronic kidney disease, unspecified whether stage 3a or 3b CKD (HCC)   [place] [facility] Melvin Pao, NP   6 months ago Chronic pain of right knee   [place] [facility] Melvin Pao, NP   7 months ago Sleep apnea, unspecified type   [place] [facility] Melvin Pao, NP              Refused Prescriptions Disp Refills   simvastatin  (ZOCOR ) 40 MG tablet [Pharmacy Med Name: Simvastatin  40 MG Oral Tablet] 70 tablet 4    Sig: TAKE 1 TABLET BY MOUTH DAILY     Cardiovascular:  Antilipid - Statins Failed - 12/27/2023  1:41 PM      Failed - Lipid Panel in normal range within the last 12 months    Cholesterol, Total  Date Value Ref Range Status  09/19/2023 173 100 - 199 mg/dL Final   LDL Chol Calc (NIH)  Date Value Ref Range Status  09/19/2023 103 (H) 0 - 99 mg/dL Final   HDL  Date Value Ref Range Status  09/19/2023 40 >39 mg/dL Final   Triglycerides  Date Value Ref Range Status  09/19/2023 174 (H) 0 - 149 mg/dL Final         Passed - Patient is not pregnant      Passed - Valid encounter within last 12  months    Recent Outpatient Visits           Yesterday Need for COVID-19 vaccine   [place] [facility] Melvin Pao, NP   1 month ago Need for influenza vaccination   [place] [facility] Melvin Pao, NP   3 months ago Stage 3 chronic kidney disease, unspecified whether stage 3a or 3b CKD (HCC)   [place] [facility] Melvin Pao, NP   6 months ago Chronic pain of right knee   [place] [facility] Melvin Pao, NP   7 months ago Sleep apnea, unspecified type   [place] [facility] Melvin Pao, NP

## 2024-01-02 ENCOUNTER — Other Ambulatory Visit: Payer: Self-pay | Admitting: Nurse Practitioner

## 2024-01-04 NOTE — Telephone Encounter (Signed)
 Contacted [facility]. Medication was picked up by the patient yesterday.

## 2024-01-04 NOTE — Telephone Encounter (Signed)
 Last note says she has only picked up 1 month of meds- should have 2 more refills at the pharmacy

## 2024-01-04 NOTE — Telephone Encounter (Signed)
 Requested medication (s) are due for refill today: yes  Requested medication (s) are on the active medication list: yes  Last refill:  11/08/23  Future visit scheduled: {Yes  Notes to clinic:  Medication not assigned to a protocol, review manually.      Requested Prescriptions  Pending Prescriptions Disp Refills   ZEPBOUND  7.5 MG/0.5ML Pen [Pharmacy Med Name: ZEPBOUND  7.5 MG/0.5ML SUBQ SOLN ML] 2 mL 2    Sig: INJECT 7.5MG  SUBCUTANEOUSLY ONCE A WEEK     Off-Protocol Failed - 01/04/2024 12:01 PM      Failed - Medication not assigned to a protocol, review manually.      Passed - Valid encounter within last 12 months    Recent Outpatient Visits           1 week ago Need for COVID-19 vaccine   [place] [facility] Melvin Pao, NP   1 month ago Need for influenza vaccination   [place] [facility] Melvin Pao, NP   3 months ago Stage 3 chronic kidney disease, unspecified whether stage 3a or 3b CKD [facility])   [place] [facility] Melvin Pao, NP   6 months ago Chronic pain of right knee    [facility] Melvin Pao, NP   7 months ago Sleep apnea, unspecified type   [facility] Melvin Pao, NP

## 2024-01-27 ENCOUNTER — Other Ambulatory Visit: Payer: Self-pay | Admitting: Nurse Practitioner

## 2024-01-29 ENCOUNTER — Telehealth: Payer: Self-pay

## 2024-01-29 NOTE — Telephone Encounter (Signed)
 Copied from CRM [id number]. Topic: Clinical - Medication Question >> Jan 29, 2024  4:27 PM Kayla Wright PARAS wrote: Reason for CRM: Pt is following up on tirzepatide  (ZEPBOUND ) 7.5 MG/0.5ML Pen stating she needs this expedited as she needs it for this upcoming Thursday

## 2024-01-29 NOTE — Telephone Encounter (Signed)
 Requested medication (s) are due for refill today: yes  Requested medication (s) are on the active medication list: yes  Last refill:  11/08/23  Future visit scheduled: yes  Notes to clinic:   Medication not assigned to a protocol, review manually.      Requested Prescriptions  Pending Prescriptions Disp Refills   ZEPBOUND  7.5 MG/0.5ML Pen [Pharmacy Med Name: ZEPBOUND  7.5 MG/0.5ML SUBQ SOLN ML] 2 mL 2    Sig: INJECT 7.5MG  SUBCUTANEOUSLY ONCE A WEEK     Off-Protocol Failed - 01/29/2024  4:26 PM      Failed - Medication not assigned to a protocol, review manually.      Passed - Valid encounter within last 12 months    Recent Outpatient Visits           1 month ago Need for COVID-19 vaccine   [place] [facility] Melvin Pao, NP   2 months ago Need for influenza vaccination   [place] [facility] Melvin Pao, NP   4 months ago Stage 3 chronic kidney disease, unspecified whether stage 3a or 3b CKD [facility])    [facility] Melvin Pao, NP   7 months ago Chronic pain of right knee    [facility] Melvin Pao, NP   8 months ago Sleep apnea, unspecified type   [facility] Melvin Pao, NP

## 2024-01-30 NOTE — Telephone Encounter (Signed)
 Called and spoke to patient. She states she has been doing well with the 7.5 and would like it sent to [facility].

## 2024-01-30 NOTE — Telephone Encounter (Signed)
 See rx request.

## 2024-01-31 ENCOUNTER — Other Ambulatory Visit (HOSPITAL_COMMUNITY): Payer: Self-pay

## 2024-01-31 ENCOUNTER — Telehealth: Payer: Self-pay

## 2024-01-31 NOTE — Telephone Encounter (Signed)
 PA team- can this be sent in for the patient please?

## 2024-01-31 NOTE — Telephone Encounter (Signed)
 Copied from CRM #[id number]. Topic: Clinical - Medication Prior Auth >> Jan 31, 2024  1:11 PM Sophia H wrote: Reason for CRM: Patient called to advise ZEPBOUND  7.5 MG/0.5ML Pen requires PA - please submit & advise. TY

## 2024-01-31 NOTE — Telephone Encounter (Signed)
 Pharmacy Patient Advocate Encounter   Received notification from Pt Calls Messages that prior authorization for Zepbound  7.5MG /0.5ML pen-injectors is required/requested.   Insurance verification completed.   The patient is insured through [facility]   Per test claim: PA required; PA submitted to above mentioned insurance via Latent Key/confirmation #/EOC [id number] Status is pending

## 2024-02-01 ENCOUNTER — Other Ambulatory Visit (HOSPITAL_COMMUNITY): Payer: Self-pay

## 2024-02-01 NOTE — Telephone Encounter (Signed)
 Called and notified patient of medication approval.

## 2024-02-01 NOTE — Telephone Encounter (Signed)
 Pharmacy Patient Advocate Encounter  Received notification from [employer] that Prior Authorization for Zepbound  7.5MG /0.5ML pen-injectors  has been APPROVED from 01/31/24 to 01/23/25. Ran test claim, Copay is $12.65. This test claim was processed through [facility]- copay amounts may vary at other pharmacies due to pharmacy/plan contracts, or as the patient moves through the different stages of their insurance plan.   PA #/Case ID/Reference #: [id number]

## 2024-02-21 ENCOUNTER — Other Ambulatory Visit: Payer: Self-pay | Admitting: Nurse Practitioner

## 2024-02-21 DIAGNOSIS — N6489 Other specified disorders of breast: Secondary | ICD-10-CM

## 2024-03-07 ENCOUNTER — Encounter

## 2024-03-07 ENCOUNTER — Other Ambulatory Visit

## 2024-03-27 ENCOUNTER — Ambulatory Visit: Admitting: Nurse Practitioner

## 2024-04-04 ENCOUNTER — Ambulatory Visit: Payer: Medicare Other
